# Patient Record
Sex: Male | Born: 1950 | Race: White | Hispanic: No | Marital: Married | Smoking: Never smoker
Health system: Southern US, Community
[De-identification: ages and names within clinical notes are randomized; demographics above are authoritative.]

## PROBLEM LIST (undated history)

## (undated) DIAGNOSIS — D72819 Decreased white blood cell count, unspecified: Secondary | ICD-10-CM

## (undated) DIAGNOSIS — J302 Other seasonal allergic rhinitis: Secondary | ICD-10-CM

## (undated) DIAGNOSIS — R112 Nausea with vomiting, unspecified: Secondary | ICD-10-CM

## (undated) DIAGNOSIS — E669 Obesity, unspecified: Secondary | ICD-10-CM

## (undated) DIAGNOSIS — D6851 Activated protein C resistance: Secondary | ICD-10-CM

## (undated) DIAGNOSIS — R600 Localized edema: Secondary | ICD-10-CM

## (undated) DIAGNOSIS — Z87442 Personal history of urinary calculi: Secondary | ICD-10-CM

## (undated) DIAGNOSIS — M1990 Unspecified osteoarthritis, unspecified site: Secondary | ICD-10-CM

## (undated) DIAGNOSIS — Z9889 Other specified postprocedural states: Secondary | ICD-10-CM

## (undated) DIAGNOSIS — D649 Anemia, unspecified: Secondary | ICD-10-CM

## (undated) DIAGNOSIS — K222 Esophageal obstruction: Secondary | ICD-10-CM

## (undated) DIAGNOSIS — Z789 Other specified health status: Secondary | ICD-10-CM

## (undated) DIAGNOSIS — G4733 Obstructive sleep apnea (adult) (pediatric): Secondary | ICD-10-CM

## (undated) DIAGNOSIS — F329 Major depressive disorder, single episode, unspecified: Secondary | ICD-10-CM

## (undated) DIAGNOSIS — M2550 Pain in unspecified joint: Secondary | ICD-10-CM

## (undated) DIAGNOSIS — J189 Pneumonia, unspecified organism: Secondary | ICD-10-CM

## (undated) DIAGNOSIS — F3289 Other specified depressive episodes: Secondary | ICD-10-CM

## (undated) DIAGNOSIS — G629 Polyneuropathy, unspecified: Secondary | ICD-10-CM

## (undated) DIAGNOSIS — L408 Other psoriasis: Secondary | ICD-10-CM

## (undated) DIAGNOSIS — R609 Edema, unspecified: Secondary | ICD-10-CM

## (undated) DIAGNOSIS — Z860100 Personal history of colon polyps, unspecified: Secondary | ICD-10-CM

## (undated) DIAGNOSIS — I2699 Other pulmonary embolism without acute cor pulmonale: Secondary | ICD-10-CM

## (undated) DIAGNOSIS — K219 Gastro-esophageal reflux disease without esophagitis: Secondary | ICD-10-CM

## (undated) DIAGNOSIS — I2510 Atherosclerotic heart disease of native coronary artery without angina pectoris: Secondary | ICD-10-CM

## (undated) DIAGNOSIS — I10 Essential (primary) hypertension: Secondary | ICD-10-CM

## (undated) DIAGNOSIS — E119 Type 2 diabetes mellitus without complications: Secondary | ICD-10-CM

## (undated) DIAGNOSIS — E785 Hyperlipidemia, unspecified: Secondary | ICD-10-CM

## (undated) HISTORY — PX: CORONARY ANGIOPLASTY WITH STENT PLACEMENT: SHX49

## (undated) HISTORY — DX: Other pulmonary embolism without acute cor pulmonale: I26.99

## (undated) HISTORY — DX: Anemia, unspecified: D64.9

## (undated) HISTORY — DX: Obstructive sleep apnea (adult) (pediatric): G47.33

## (undated) HISTORY — PX: ESOPHAGOGASTRODUODENOSCOPY: SHX1529

## (undated) HISTORY — PX: CYSTOSCOPY: SUR368

## (undated) HISTORY — DX: Major depressive disorder, single episode, unspecified: F32.9

## (undated) HISTORY — DX: Other specified health status: Z78.9

## (undated) HISTORY — DX: Obesity, unspecified: E66.9

## (undated) HISTORY — DX: Gastro-esophageal reflux disease without esophagitis: K21.9

## (undated) HISTORY — DX: Atherosclerotic heart disease of native coronary artery without angina pectoris: I25.10

## (undated) HISTORY — PX: COLONOSCOPY: SHX174

## (undated) HISTORY — DX: Essential (primary) hypertension: I10

## (undated) HISTORY — DX: Esophageal obstruction: K22.2

## (undated) HISTORY — DX: Type 2 diabetes mellitus without complications: E11.9

## (undated) HISTORY — PX: CARPAL TUNNEL RELEASE: SHX101

## (undated) HISTORY — DX: Hyperlipidemia, unspecified: E78.5

## (undated) HISTORY — DX: Decreased white blood cell count, unspecified: D72.819

## (undated) HISTORY — DX: Other psoriasis: L40.8

## (undated) HISTORY — DX: Activated protein C resistance: D68.51

## (undated) HISTORY — DX: Other specified depressive episodes: F32.89

## (undated) HISTORY — DX: Personal history of urinary calculi: Z87.442

## (undated) HISTORY — PX: ANGIOPLASTY: SHX39

---

## 2000-07-02 ENCOUNTER — Emergency Department (HOSPITAL_COMMUNITY): Admission: EM | Admit: 2000-07-02 | Discharge: 2000-07-02 | Payer: Self-pay | Admitting: Emergency Medicine

## 2000-07-02 ENCOUNTER — Encounter: Payer: Self-pay | Admitting: Emergency Medicine

## 2001-04-03 ENCOUNTER — Encounter: Admission: RE | Admit: 2001-04-03 | Discharge: 2001-07-02 | Payer: Self-pay | Admitting: Internal Medicine

## 2002-10-03 ENCOUNTER — Encounter (INDEPENDENT_AMBULATORY_CARE_PROVIDER_SITE_OTHER): Payer: Self-pay | Admitting: Gastroenterology

## 2002-10-10 ENCOUNTER — Encounter (INDEPENDENT_AMBULATORY_CARE_PROVIDER_SITE_OTHER): Payer: Self-pay | Admitting: *Deleted

## 2004-05-04 ENCOUNTER — Inpatient Hospital Stay (HOSPITAL_COMMUNITY): Admission: EM | Admit: 2004-05-04 | Discharge: 2004-05-04 | Payer: Self-pay | Admitting: *Deleted

## 2004-05-24 ENCOUNTER — Encounter (HOSPITAL_COMMUNITY): Admission: RE | Admit: 2004-05-24 | Discharge: 2004-08-22 | Payer: Self-pay | Admitting: Cardiology

## 2004-07-29 ENCOUNTER — Ambulatory Visit: Payer: Self-pay | Admitting: Internal Medicine

## 2004-08-03 ENCOUNTER — Ambulatory Visit: Payer: Self-pay | Admitting: Cardiology

## 2004-08-17 ENCOUNTER — Ambulatory Visit: Payer: Self-pay | Admitting: Internal Medicine

## 2004-08-23 ENCOUNTER — Encounter (HOSPITAL_COMMUNITY): Admission: RE | Admit: 2004-08-23 | Discharge: 2004-09-24 | Payer: Self-pay | Admitting: Cardiology

## 2004-08-24 ENCOUNTER — Ambulatory Visit: Payer: Self-pay | Admitting: Internal Medicine

## 2004-09-02 ENCOUNTER — Ambulatory Visit: Payer: Self-pay | Admitting: Cardiology

## 2004-09-03 ENCOUNTER — Ambulatory Visit: Payer: Self-pay

## 2004-09-14 ENCOUNTER — Ambulatory Visit: Payer: Self-pay | Admitting: Cardiology

## 2004-09-28 ENCOUNTER — Inpatient Hospital Stay (HOSPITAL_COMMUNITY): Admission: EM | Admit: 2004-09-28 | Discharge: 2004-09-29 | Payer: Self-pay | Admitting: Emergency Medicine

## 2004-09-28 ENCOUNTER — Ambulatory Visit: Payer: Self-pay | Admitting: Cardiovascular Disease

## 2004-10-05 ENCOUNTER — Ambulatory Visit: Payer: Self-pay | Admitting: Cardiology

## 2004-10-07 ENCOUNTER — Ambulatory Visit: Payer: Self-pay | Admitting: Cardiology

## 2005-01-04 ENCOUNTER — Ambulatory Visit: Payer: Self-pay | Admitting: Internal Medicine

## 2005-01-17 ENCOUNTER — Ambulatory Visit: Payer: Self-pay | Admitting: Cardiology

## 2005-01-19 ENCOUNTER — Ambulatory Visit: Payer: Self-pay | Admitting: Internal Medicine

## 2005-02-28 ENCOUNTER — Ambulatory Visit: Payer: Self-pay | Admitting: Cardiology

## 2005-03-02 ENCOUNTER — Ambulatory Visit: Payer: Self-pay | Admitting: Internal Medicine

## 2005-06-24 ENCOUNTER — Ambulatory Visit: Payer: Self-pay | Admitting: Family Medicine

## 2005-07-04 ENCOUNTER — Ambulatory Visit: Payer: Self-pay | Admitting: Internal Medicine

## 2005-07-28 ENCOUNTER — Ambulatory Visit: Payer: Self-pay | Admitting: Internal Medicine

## 2005-08-09 ENCOUNTER — Ambulatory Visit: Payer: Self-pay | Admitting: Internal Medicine

## 2005-08-16 ENCOUNTER — Ambulatory Visit: Payer: Self-pay | Admitting: Internal Medicine

## 2005-09-01 ENCOUNTER — Ambulatory Visit: Payer: Self-pay | Admitting: Cardiology

## 2005-09-07 ENCOUNTER — Ambulatory Visit (HOSPITAL_BASED_OUTPATIENT_CLINIC_OR_DEPARTMENT_OTHER): Admission: RE | Admit: 2005-09-07 | Discharge: 2005-09-07 | Payer: Self-pay | Admitting: Internal Medicine

## 2005-09-07 ENCOUNTER — Encounter: Payer: Self-pay | Admitting: Internal Medicine

## 2005-09-12 ENCOUNTER — Ambulatory Visit: Payer: Self-pay | Admitting: Pulmonary Disease

## 2005-09-20 ENCOUNTER — Ambulatory Visit: Payer: Self-pay | Admitting: Internal Medicine

## 2005-09-26 HISTORY — PX: KNEE ARTHROSCOPY: SUR90

## 2005-09-30 ENCOUNTER — Ambulatory Visit: Payer: Self-pay | Admitting: Cardiology

## 2005-09-30 ENCOUNTER — Inpatient Hospital Stay (HOSPITAL_COMMUNITY): Admission: EM | Admit: 2005-09-30 | Discharge: 2005-10-01 | Payer: Self-pay | Admitting: Emergency Medicine

## 2005-10-02 ENCOUNTER — Emergency Department (HOSPITAL_COMMUNITY): Admission: EM | Admit: 2005-10-02 | Discharge: 2005-10-02 | Payer: Self-pay | Admitting: Family Medicine

## 2005-10-10 ENCOUNTER — Ambulatory Visit: Payer: Self-pay | Admitting: Internal Medicine

## 2005-10-12 ENCOUNTER — Ambulatory Visit: Payer: Self-pay

## 2005-10-20 ENCOUNTER — Ambulatory Visit: Payer: Self-pay | Admitting: Cardiology

## 2005-11-25 ENCOUNTER — Ambulatory Visit: Payer: Self-pay | Admitting: Internal Medicine

## 2005-12-15 ENCOUNTER — Ambulatory Visit: Payer: Self-pay | Admitting: Internal Medicine

## 2005-12-22 ENCOUNTER — Ambulatory Visit: Payer: Self-pay | Admitting: Internal Medicine

## 2005-12-25 ENCOUNTER — Ambulatory Visit: Payer: Self-pay | Admitting: Internal Medicine

## 2005-12-25 ENCOUNTER — Inpatient Hospital Stay (HOSPITAL_COMMUNITY): Admission: EM | Admit: 2005-12-25 | Discharge: 2005-12-27 | Payer: Self-pay | Admitting: *Deleted

## 2006-01-02 ENCOUNTER — Emergency Department (HOSPITAL_COMMUNITY): Admission: EM | Admit: 2006-01-02 | Discharge: 2006-01-02 | Payer: Self-pay | Admitting: Family Medicine

## 2006-01-03 ENCOUNTER — Ambulatory Visit: Payer: Self-pay | Admitting: Internal Medicine

## 2006-01-10 ENCOUNTER — Ambulatory Visit: Payer: Self-pay | Admitting: Internal Medicine

## 2006-02-09 ENCOUNTER — Ambulatory Visit: Payer: Self-pay | Admitting: Internal Medicine

## 2006-04-06 ENCOUNTER — Ambulatory Visit: Payer: Self-pay | Admitting: Internal Medicine

## 2006-04-14 ENCOUNTER — Ambulatory Visit: Payer: Self-pay | Admitting: Internal Medicine

## 2006-05-01 ENCOUNTER — Ambulatory Visit: Payer: Self-pay | Admitting: Internal Medicine

## 2006-05-04 ENCOUNTER — Ambulatory Visit: Payer: Self-pay | Admitting: Internal Medicine

## 2006-05-04 ENCOUNTER — Ambulatory Visit: Payer: Self-pay | Admitting: Endocrinology

## 2006-05-15 ENCOUNTER — Ambulatory Visit: Payer: Self-pay | Admitting: Internal Medicine

## 2006-05-22 ENCOUNTER — Ambulatory Visit: Payer: Self-pay | Admitting: Internal Medicine

## 2006-06-19 ENCOUNTER — Ambulatory Visit: Payer: Self-pay | Admitting: Internal Medicine

## 2006-06-26 ENCOUNTER — Encounter: Admission: RE | Admit: 2006-06-26 | Discharge: 2006-08-23 | Payer: Self-pay | Admitting: Internal Medicine

## 2006-08-02 ENCOUNTER — Ambulatory Visit: Payer: Self-pay | Admitting: Internal Medicine

## 2006-08-02 LAB — CONVERTED CEMR LAB
ALT: 25 units/L (ref 0–40)
AST: 19 units/L (ref 0–37)
Albumin: 3.7 g/dL (ref 3.5–5.2)
Alkaline Phosphatase: 91 units/L (ref 39–117)
BUN: 17 mg/dL (ref 6–23)
Bilirubin, Direct: 0.1 mg/dL (ref 0.0–0.3)
CO2: 29 meq/L (ref 19–32)
Calcium: 10 mg/dL (ref 8.4–10.5)
Chloride: 105 meq/L (ref 96–112)
Creatinine, Ser: 0.8 mg/dL (ref 0.4–1.5)
GFR calc non Af Amer: 107 mL/min
Glomerular Filtration Rate, Af Am: 130 mL/min/{1.73_m2}
Glucose, Bld: 103 mg/dL — ABNORMAL HIGH (ref 70–99)
Hgb A1c MFr Bld: 7.4 % — ABNORMAL HIGH (ref 4.6–6.0)
Potassium: 4.6 meq/L (ref 3.5–5.1)
Sodium: 142 meq/L (ref 135–145)
Total Bilirubin: 0.5 mg/dL (ref 0.3–1.2)
Total Protein: 6.6 g/dL (ref 6.0–8.3)

## 2006-08-14 ENCOUNTER — Ambulatory Visit: Payer: Self-pay | Admitting: Cardiology

## 2006-08-24 ENCOUNTER — Encounter: Admission: RE | Admit: 2006-08-24 | Discharge: 2006-08-24 | Payer: Self-pay | Admitting: Orthopedic Surgery

## 2006-08-25 ENCOUNTER — Ambulatory Visit: Payer: Self-pay | Admitting: Cardiology

## 2006-09-06 ENCOUNTER — Ambulatory Visit: Payer: Self-pay | Admitting: Internal Medicine

## 2006-09-27 ENCOUNTER — Encounter: Admission: RE | Admit: 2006-09-27 | Discharge: 2006-12-26 | Payer: Self-pay | Admitting: Internal Medicine

## 2006-10-19 ENCOUNTER — Ambulatory Visit: Payer: Self-pay | Admitting: Cardiology

## 2006-12-18 ENCOUNTER — Ambulatory Visit: Payer: Self-pay | Admitting: Cardiology

## 2006-12-25 ENCOUNTER — Ambulatory Visit: Payer: Self-pay

## 2007-01-16 ENCOUNTER — Ambulatory Visit: Payer: Self-pay | Admitting: Internal Medicine

## 2007-01-16 LAB — CONVERTED CEMR LAB
ALT: 25 units/L (ref 0–40)
AST: 22 units/L (ref 0–37)
Albumin: 3.5 g/dL (ref 3.5–5.2)
Alkaline Phosphatase: 98 units/L (ref 39–117)
BUN: 7 mg/dL (ref 6–23)
Bilirubin, Direct: 0.1 mg/dL (ref 0.0–0.3)
CO2: 31 meq/L (ref 19–32)
Calcium: 9.4 mg/dL (ref 8.4–10.5)
Chloride: 107 meq/L (ref 96–112)
Cholesterol: 151 mg/dL (ref 0–200)
Creatinine, Ser: 0.7 mg/dL (ref 0.4–1.5)
Direct LDL: 98.2 mg/dL
GFR calc Af Amer: 152 mL/min
GFR calc non Af Amer: 125 mL/min
Glucose, Bld: 72 mg/dL (ref 70–99)
HDL: 29.8 mg/dL — ABNORMAL LOW (ref >39.0)
Hgb A1c MFr Bld: 7.4 % — ABNORMAL HIGH (ref 4.6–6.0)
Potassium: 3.4 meq/L — ABNORMAL LOW (ref 3.5–5.1)
Sodium: 145 meq/L (ref 135–145)
Total Bilirubin: 0.7 mg/dL (ref 0.3–1.2)
Total CHOL/HDL Ratio: 5.1
Total Protein: 6.7 g/dL (ref 6.0–8.3)
Triglycerides: 221 mg/dL (ref 0–149)
VLDL: 44 mg/dL — ABNORMAL HIGH (ref 0–40)

## 2007-01-31 ENCOUNTER — Ambulatory Visit: Payer: Self-pay | Admitting: Internal Medicine

## 2007-02-08 ENCOUNTER — Encounter: Admission: RE | Admit: 2007-02-08 | Discharge: 2007-02-08 | Payer: Self-pay | Admitting: Internal Medicine

## 2007-02-16 ENCOUNTER — Ambulatory Visit: Payer: Self-pay | Admitting: Internal Medicine

## 2007-02-21 ENCOUNTER — Ambulatory Visit: Payer: Self-pay | Admitting: Gastroenterology

## 2007-03-06 ENCOUNTER — Ambulatory Visit: Payer: Self-pay | Admitting: Internal Medicine

## 2007-04-12 DIAGNOSIS — K219 Gastro-esophageal reflux disease without esophagitis: Secondary | ICD-10-CM | POA: Insufficient documentation

## 2007-04-12 DIAGNOSIS — I2510 Atherosclerotic heart disease of native coronary artery without angina pectoris: Secondary | ICD-10-CM | POA: Insufficient documentation

## 2007-04-12 DIAGNOSIS — E785 Hyperlipidemia, unspecified: Secondary | ICD-10-CM | POA: Insufficient documentation

## 2007-04-12 DIAGNOSIS — I10 Essential (primary) hypertension: Secondary | ICD-10-CM

## 2007-04-26 ENCOUNTER — Ambulatory Visit: Payer: Self-pay | Admitting: Internal Medicine

## 2007-04-26 LAB — CONVERTED CEMR LAB
ALT: 21 units/L (ref 0–53)
AST: 17 units/L (ref 0–37)
Albumin: 3.7 g/dL (ref 3.5–5.2)
Alkaline Phosphatase: 118 units/L — ABNORMAL HIGH (ref 39–117)
BUN: 17 mg/dL (ref 6–23)
Basophils Relative: 1.2 % — ABNORMAL HIGH (ref 0.0–1.0)
Bilirubin, Direct: 0.1 mg/dL (ref 0.0–0.3)
CO2: 27 meq/L (ref 19–32)
Calcium: 9.7 mg/dL (ref 8.4–10.5)
Chloride: 105 meq/L (ref 96–112)
Cholesterol, target level: 200 mg/dL
Cholesterol: 166 mg/dL (ref 0–200)
Creatinine, Ser: 0.7 mg/dL (ref 0.4–1.5)
Direct LDL: 106.9 mg/dL
Eosinophils Relative: 6.7 % — ABNORMAL HIGH (ref 0.0–5.0)
GFR calc Af Amer: 152 mL/min
GFR calc non Af Amer: 125 mL/min
Glucose, Bld: 189 mg/dL — ABNORMAL HIGH (ref 70–99)
HCT: 37.4 % — ABNORMAL LOW (ref 39.0–52.0)
HDL goal, serum: 40 mg/dL
HDL: 23.7 mg/dL — ABNORMAL LOW (ref >39.0)
Hemoglobin: 13.1 g/dL (ref 13.0–17.0)
LDL Goal: 100 mg/dL
Lymphocytes Relative: 16.8 % (ref 12.0–46.0)
MCHC: 35 g/dL (ref 30.0–36.0)
MCV: 84.9 fL (ref 78.0–100.0)
Monocytes Relative: 3.6 % (ref 3.0–11.0)
Neutrophils Relative %: 71.7 % (ref 43.0–77.0)
Platelets: 285 10*3/uL (ref 150–400)
Potassium: 4 meq/L (ref 3.5–5.1)
RBC: 4.4 M/uL (ref 4.22–5.81)
RDW: 13 % (ref 11.5–14.6)
Sodium: 140 meq/L (ref 135–145)
Total Bilirubin: 0.6 mg/dL (ref 0.3–1.2)
Total CHOL/HDL Ratio: 7
Total Protein: 6.9 g/dL (ref 6.0–8.3)
Triglycerides: 254 mg/dL (ref 0–149)
VLDL: 51 mg/dL — ABNORMAL HIGH (ref 0–40)
WBC: 5.2 10*3/uL (ref 4.5–10.5)

## 2007-05-02 ENCOUNTER — Telehealth (INDEPENDENT_AMBULATORY_CARE_PROVIDER_SITE_OTHER): Payer: Self-pay | Admitting: *Deleted

## 2007-06-08 ENCOUNTER — Ambulatory Visit: Payer: Self-pay | Admitting: Internal Medicine

## 2007-06-23 ENCOUNTER — Ambulatory Visit: Payer: Self-pay | Admitting: Internal Medicine

## 2007-06-23 ENCOUNTER — Observation Stay (HOSPITAL_COMMUNITY): Admission: EM | Admit: 2007-06-23 | Discharge: 2007-06-24 | Payer: Self-pay | Admitting: Emergency Medicine

## 2007-06-27 ENCOUNTER — Encounter: Payer: Self-pay | Admitting: Internal Medicine

## 2007-06-28 ENCOUNTER — Ambulatory Visit: Payer: Self-pay | Admitting: Cardiology

## 2007-06-28 ENCOUNTER — Ambulatory Visit: Payer: Self-pay

## 2007-07-05 ENCOUNTER — Encounter: Payer: Self-pay | Admitting: *Deleted

## 2007-07-11 ENCOUNTER — Ambulatory Visit: Payer: Self-pay | Admitting: Internal Medicine

## 2007-07-16 ENCOUNTER — Telehealth: Payer: Self-pay | Admitting: *Deleted

## 2007-07-17 ENCOUNTER — Encounter: Admission: RE | Admit: 2007-07-17 | Discharge: 2007-08-22 | Payer: Self-pay | Admitting: Internal Medicine

## 2007-07-19 ENCOUNTER — Telehealth: Payer: Self-pay | Admitting: Internal Medicine

## 2007-08-08 ENCOUNTER — Ambulatory Visit: Payer: Self-pay | Admitting: Internal Medicine

## 2007-08-08 LAB — CONVERTED CEMR LAB
ALT: 21 units/L (ref 0–53)
AST: 15 units/L (ref 0–37)
Albumin: 3.6 g/dL (ref 3.5–5.2)
Alkaline Phosphatase: 118 units/L — ABNORMAL HIGH (ref 39–117)
BUN: 17 mg/dL (ref 6–23)
Basophils Absolute: 0.1 10*3/uL (ref 0.0–0.1)
Basophils Relative: 1.4 % — ABNORMAL HIGH (ref 0.0–1.0)
Bilirubin, Direct: 0.1 mg/dL (ref 0.0–0.3)
CO2: 31 meq/L (ref 19–32)
Calcium: 10 mg/dL (ref 8.4–10.5)
Chloride: 104 meq/L (ref 96–112)
Cholesterol: 249 mg/dL (ref 0–200)
Creatinine, Ser: 0.7 mg/dL (ref 0.4–1.5)
Direct LDL: 158.3 mg/dL
Eosinophils Absolute: 0.2 10*3/uL (ref 0.0–0.6)
Eosinophils Relative: 3.5 % (ref 0.0–5.0)
GFR calc Af Amer: 151 mL/min
GFR calc non Af Amer: 125 mL/min
Glucose, Bld: 171 mg/dL — ABNORMAL HIGH (ref 70–99)
HCT: 38.2 % — ABNORMAL LOW (ref 39.0–52.0)
HDL: 25.5 mg/dL — ABNORMAL LOW (ref >39.0)
Hemoglobin: 13.2 g/dL (ref 13.0–17.0)
Hgb A1c MFr Bld: 7.5 % — ABNORMAL HIGH (ref 4.6–6.0)
Lymphocytes Relative: 19.3 % (ref 12.0–46.0)
MCHC: 34.5 g/dL (ref 30.0–36.0)
MCV: 84.1 fL (ref 78.0–100.0)
Monocytes Absolute: 0.2 10*3/uL (ref 0.2–0.7)
Monocytes Relative: 3.2 % (ref 3.0–11.0)
Neutro Abs: 3.7 10*3/uL (ref 1.4–7.7)
Neutrophils Relative %: 72.6 % (ref 43.0–77.0)
Platelets: 268 10*3/uL (ref 150–400)
Potassium: 4.6 meq/L (ref 3.5–5.1)
RBC: 4.55 M/uL (ref 4.22–5.81)
RDW: 13.2 % (ref 11.5–14.6)
Sodium: 141 meq/L (ref 135–145)
Total Bilirubin: 0.7 mg/dL (ref 0.3–1.2)
Total CHOL/HDL Ratio: 9.8
Total Protein: 6.6 g/dL (ref 6.0–8.3)
Triglycerides: 417 mg/dL (ref 0–149)
VLDL: 83 mg/dL — ABNORMAL HIGH (ref 0–40)
WBC: 5.2 10*3/uL (ref 4.5–10.5)

## 2007-08-15 ENCOUNTER — Ambulatory Visit: Payer: Self-pay | Admitting: Internal Medicine

## 2007-08-22 ENCOUNTER — Encounter: Payer: Self-pay | Admitting: Internal Medicine

## 2007-09-11 ENCOUNTER — Ambulatory Visit: Payer: Self-pay | Admitting: Internal Medicine

## 2007-10-30 ENCOUNTER — Ambulatory Visit: Payer: Self-pay | Admitting: Internal Medicine

## 2007-10-30 ENCOUNTER — Ambulatory Visit: Payer: Self-pay | Admitting: Cardiology

## 2007-10-30 LAB — CONVERTED CEMR LAB
ALT: 19 units/L (ref 0–53)
Alkaline Phosphatase: 108 units/L (ref 39–117)
BUN: 15 mg/dL (ref 6–23)
Bilirubin, Direct: 0.2 mg/dL (ref 0.0–0.3)
CO2: 28 meq/L (ref 19–32)
Calcium: 10.1 mg/dL (ref 8.4–10.5)
Direct LDL: 143.3 mg/dL
GFR calc Af Amer: 130 mL/min
GFR calc non Af Amer: 107 mL/min
HDL: 27.8 mg/dL — ABNORMAL LOW (ref >39.0)
Potassium: 4.8 meq/L (ref 3.5–5.1)
Total CHOL/HDL Ratio: 8.6
Total Protein: 6.7 g/dL (ref 6.0–8.3)
Triglycerides: 376 mg/dL (ref 0–149)
VLDL: 75 mg/dL — ABNORMAL HIGH (ref 0–40)

## 2007-11-06 ENCOUNTER — Ambulatory Visit: Payer: Self-pay | Admitting: Internal Medicine

## 2007-12-18 ENCOUNTER — Telehealth: Payer: Self-pay | Admitting: Internal Medicine

## 2008-01-01 ENCOUNTER — Ambulatory Visit: Payer: Self-pay | Admitting: Internal Medicine

## 2008-01-01 LAB — CONVERTED CEMR LAB
ALT: 20 units/L (ref 0–53)
Albumin: 3.8 g/dL (ref 3.5–5.2)
Alkaline Phosphatase: 109 units/L (ref 39–117)
Bilirubin, Direct: 0.1 mg/dL (ref 0.0–0.3)
Cholesterol: 143 mg/dL (ref 0–200)
Total Protein: 7 g/dL (ref 6.0–8.3)
Triglycerides: 197 mg/dL — ABNORMAL HIGH (ref 0–149)
VLDL: 39 mg/dL (ref 0–40)

## 2008-01-07 ENCOUNTER — Ambulatory Visit: Payer: Self-pay | Admitting: Internal Medicine

## 2008-01-09 ENCOUNTER — Encounter: Payer: Self-pay | Admitting: Internal Medicine

## 2008-01-31 ENCOUNTER — Encounter
Admission: RE | Admit: 2008-01-31 | Discharge: 2008-04-30 | Payer: Self-pay | Admitting: Physical Medicine and Rehabilitation

## 2008-02-04 ENCOUNTER — Ambulatory Visit: Payer: Self-pay | Admitting: Physical Medicine and Rehabilitation

## 2008-02-13 ENCOUNTER — Encounter: Admission: RE | Admit: 2008-02-13 | Discharge: 2008-02-13 | Payer: Self-pay | Admitting: Anesthesiology

## 2008-03-07 ENCOUNTER — Ambulatory Visit: Payer: Self-pay | Admitting: Physical Medicine and Rehabilitation

## 2008-03-20 ENCOUNTER — Encounter
Admission: RE | Admit: 2008-03-20 | Discharge: 2008-05-19 | Payer: Self-pay | Admitting: Physical Medicine and Rehabilitation

## 2008-04-04 ENCOUNTER — Ambulatory Visit: Payer: Self-pay | Admitting: Internal Medicine

## 2008-04-04 LAB — CONVERTED CEMR LAB
ALT: 23 units/L (ref 0–53)
AST: 17 units/L (ref 0–37)
Alkaline Phosphatase: 111 units/L (ref 39–117)
BUN: 19 mg/dL (ref 6–23)
Bilirubin, Direct: 0.1 mg/dL (ref 0.0–0.3)
CO2: 29 meq/L (ref 19–32)
Glucose, Bld: 245 mg/dL — ABNORMAL HIGH (ref 70–99)
Potassium: 4.2 meq/L (ref 3.5–5.1)
Sodium: 139 meq/L (ref 135–145)
Total Protein: 7.1 g/dL (ref 6.0–8.3)

## 2008-04-09 ENCOUNTER — Ambulatory Visit: Payer: Self-pay | Admitting: Physical Medicine and Rehabilitation

## 2008-04-11 ENCOUNTER — Ambulatory Visit: Payer: Self-pay | Admitting: Internal Medicine

## 2008-04-16 ENCOUNTER — Telehealth: Payer: Self-pay | Admitting: Internal Medicine

## 2008-04-18 ENCOUNTER — Telehealth: Payer: Self-pay | Admitting: *Deleted

## 2008-04-22 ENCOUNTER — Ambulatory Visit: Payer: Self-pay | Admitting: Internal Medicine

## 2008-05-06 ENCOUNTER — Telehealth: Payer: Self-pay | Admitting: Internal Medicine

## 2008-05-08 ENCOUNTER — Ambulatory Visit: Payer: Self-pay | Admitting: Cardiology

## 2008-05-12 ENCOUNTER — Encounter
Admission: RE | Admit: 2008-05-12 | Discharge: 2008-05-12 | Payer: Self-pay | Admitting: Physical Medicine and Rehabilitation

## 2008-06-06 ENCOUNTER — Telehealth: Payer: Self-pay | Admitting: Internal Medicine

## 2008-06-11 ENCOUNTER — Ambulatory Visit (HOSPITAL_COMMUNITY): Admission: EM | Admit: 2008-06-11 | Discharge: 2008-06-11 | Payer: Self-pay | Admitting: Emergency Medicine

## 2008-06-23 ENCOUNTER — Ambulatory Visit (HOSPITAL_COMMUNITY): Admission: RE | Admit: 2008-06-23 | Discharge: 2008-06-23 | Payer: Self-pay | Admitting: Urology

## 2008-07-10 ENCOUNTER — Ambulatory Visit: Payer: Self-pay | Admitting: *Deleted

## 2008-07-10 ENCOUNTER — Inpatient Hospital Stay (HOSPITAL_COMMUNITY): Admission: EM | Admit: 2008-07-10 | Discharge: 2008-07-12 | Payer: Self-pay | Admitting: Emergency Medicine

## 2008-07-23 ENCOUNTER — Ambulatory Visit: Payer: Self-pay | Admitting: Cardiology

## 2008-07-31 ENCOUNTER — Encounter (HOSPITAL_COMMUNITY): Admission: RE | Admit: 2008-07-31 | Discharge: 2008-09-24 | Payer: Self-pay | Admitting: Cardiology

## 2008-08-05 ENCOUNTER — Encounter: Payer: Self-pay | Admitting: Internal Medicine

## 2008-08-07 ENCOUNTER — Ambulatory Visit: Payer: Self-pay | Admitting: Internal Medicine

## 2008-08-07 LAB — CONVERTED CEMR LAB
AST: 16 units/L (ref 0–37)
Alkaline Phosphatase: 116 units/L (ref 39–117)
Bilirubin, Direct: 0.1 mg/dL (ref 0.0–0.3)
Chloride: 101 meq/L (ref 96–112)
Creatinine, Ser: 0.8 mg/dL (ref 0.4–1.5)
GFR calc non Af Amer: 107 mL/min
Hgb A1c MFr Bld: 7.8 % — ABNORMAL HIGH (ref 4.6–6.0)
Potassium: 4.1 meq/L (ref 3.5–5.1)
Sodium: 138 meq/L (ref 135–145)
Total Bilirubin: 0.7 mg/dL (ref 0.3–1.2)
Total CHOL/HDL Ratio: 5.1
VLDL: 42 mg/dL — ABNORMAL HIGH (ref 0–40)

## 2008-08-15 ENCOUNTER — Ambulatory Visit: Payer: Self-pay | Admitting: Internal Medicine

## 2008-08-20 ENCOUNTER — Encounter: Payer: Self-pay | Admitting: Internal Medicine

## 2008-08-25 ENCOUNTER — Telehealth: Payer: Self-pay | Admitting: Internal Medicine

## 2008-08-26 ENCOUNTER — Ambulatory Visit: Payer: Self-pay | Admitting: Internal Medicine

## 2008-09-26 ENCOUNTER — Encounter (HOSPITAL_COMMUNITY): Admission: RE | Admit: 2008-09-26 | Discharge: 2008-11-24 | Payer: Self-pay | Admitting: Cardiology

## 2008-10-02 ENCOUNTER — Ambulatory Visit: Payer: Self-pay | Admitting: Internal Medicine

## 2008-10-06 ENCOUNTER — Encounter: Admission: RE | Admit: 2008-10-06 | Discharge: 2008-10-06 | Payer: Self-pay | Admitting: Sports Medicine

## 2008-10-07 ENCOUNTER — Ambulatory Visit: Payer: Self-pay | Admitting: Cardiology

## 2008-10-08 ENCOUNTER — Encounter: Payer: Self-pay | Admitting: Internal Medicine

## 2008-10-08 ENCOUNTER — Ambulatory Visit: Payer: Self-pay

## 2008-10-09 ENCOUNTER — Ambulatory Visit: Payer: Self-pay | Admitting: Internal Medicine

## 2008-10-14 ENCOUNTER — Encounter: Admission: RE | Admit: 2008-10-14 | Discharge: 2008-10-14 | Payer: Self-pay | Admitting: Internal Medicine

## 2008-11-17 ENCOUNTER — Ambulatory Visit: Payer: Self-pay | Admitting: Internal Medicine

## 2008-11-17 LAB — CONVERTED CEMR LAB
Albumin: 4 g/dL (ref 3.5–5.2)
Bilirubin, Direct: 0.1 mg/dL (ref 0.0–0.3)
Calcium: 9.6 mg/dL (ref 8.4–10.5)
Direct LDL: 92.2 mg/dL
GFR calc Af Amer: 151 mL/min
Glucose, Bld: 194 mg/dL — ABNORMAL HIGH (ref 70–99)
HDL: 26.3 mg/dL — ABNORMAL LOW (ref >39.0)
Sodium: 141 meq/L (ref 135–145)
Total Protein: 7.4 g/dL (ref 6.0–8.3)
Triglycerides: 266 mg/dL (ref 0–149)

## 2008-11-20 ENCOUNTER — Ambulatory Visit: Payer: Self-pay | Admitting: Internal Medicine

## 2008-12-09 ENCOUNTER — Telehealth: Payer: Self-pay | Admitting: Internal Medicine

## 2008-12-13 ENCOUNTER — Ambulatory Visit: Payer: Self-pay | Admitting: Family Medicine

## 2008-12-13 ENCOUNTER — Telehealth (INDEPENDENT_AMBULATORY_CARE_PROVIDER_SITE_OTHER): Payer: Self-pay | Admitting: *Deleted

## 2008-12-15 ENCOUNTER — Ambulatory Visit: Payer: Self-pay | Admitting: Cardiology

## 2009-01-12 ENCOUNTER — Inpatient Hospital Stay (HOSPITAL_COMMUNITY): Admission: EM | Admit: 2009-01-12 | Discharge: 2009-01-15 | Payer: Self-pay | Admitting: Emergency Medicine

## 2009-01-12 ENCOUNTER — Ambulatory Visit: Payer: Self-pay | Admitting: Internal Medicine

## 2009-01-13 ENCOUNTER — Ambulatory Visit: Payer: Self-pay | Admitting: Vascular Surgery

## 2009-01-13 ENCOUNTER — Encounter: Payer: Self-pay | Admitting: Cardiology

## 2009-01-13 ENCOUNTER — Encounter: Payer: Self-pay | Admitting: Internal Medicine

## 2009-01-15 ENCOUNTER — Ambulatory Visit: Payer: Self-pay | Admitting: Oncology

## 2009-01-16 DIAGNOSIS — K222 Esophageal obstruction: Secondary | ICD-10-CM

## 2009-01-16 DIAGNOSIS — Z9989 Dependence on other enabling machines and devices: Secondary | ICD-10-CM

## 2009-01-16 DIAGNOSIS — G4733 Obstructive sleep apnea (adult) (pediatric): Secondary | ICD-10-CM | POA: Insufficient documentation

## 2009-01-16 DIAGNOSIS — I701 Atherosclerosis of renal artery: Secondary | ICD-10-CM

## 2009-01-16 DIAGNOSIS — L409 Psoriasis, unspecified: Secondary | ICD-10-CM | POA: Insufficient documentation

## 2009-01-16 HISTORY — DX: Obstructive sleep apnea (adult) (pediatric): G47.33

## 2009-01-16 HISTORY — DX: Esophageal obstruction: K22.2

## 2009-01-20 ENCOUNTER — Ambulatory Visit: Payer: Self-pay | Admitting: Cardiology

## 2009-01-23 ENCOUNTER — Ambulatory Visit: Payer: Self-pay | Admitting: Internal Medicine

## 2009-01-23 DIAGNOSIS — D6851 Activated protein C resistance: Secondary | ICD-10-CM

## 2009-01-30 ENCOUNTER — Encounter: Payer: Self-pay | Admitting: Cardiology

## 2009-02-05 ENCOUNTER — Ambulatory Visit: Payer: Self-pay | Admitting: Cardiology

## 2009-02-05 ENCOUNTER — Encounter: Payer: Self-pay | Admitting: Internal Medicine

## 2009-02-06 ENCOUNTER — Telehealth: Payer: Self-pay | Admitting: Internal Medicine

## 2009-02-09 ENCOUNTER — Encounter: Payer: Self-pay | Admitting: Internal Medicine

## 2009-02-11 ENCOUNTER — Encounter: Payer: Self-pay | Admitting: Critical Care Medicine

## 2009-02-11 ENCOUNTER — Encounter: Payer: Self-pay | Admitting: Cardiology

## 2009-02-21 ENCOUNTER — Emergency Department (HOSPITAL_COMMUNITY): Admission: EM | Admit: 2009-02-21 | Discharge: 2009-02-21 | Payer: Self-pay | Admitting: Emergency Medicine

## 2009-02-27 ENCOUNTER — Ambulatory Visit: Payer: Self-pay | Admitting: Internal Medicine

## 2009-02-27 DIAGNOSIS — Z87442 Personal history of urinary calculi: Secondary | ICD-10-CM | POA: Insufficient documentation

## 2009-02-27 HISTORY — DX: Personal history of urinary calculi: Z87.442

## 2009-03-06 ENCOUNTER — Ambulatory Visit (HOSPITAL_COMMUNITY): Admission: RE | Admit: 2009-03-06 | Discharge: 2009-03-06 | Payer: Self-pay | Admitting: Urology

## 2009-03-08 DIAGNOSIS — F329 Major depressive disorder, single episode, unspecified: Secondary | ICD-10-CM

## 2009-03-12 ENCOUNTER — Ambulatory Visit: Payer: Self-pay | Admitting: Internal Medicine

## 2009-03-12 ENCOUNTER — Ambulatory Visit: Payer: Self-pay | Admitting: Cardiology

## 2009-03-12 LAB — CONVERTED CEMR LAB
ALT: 18 units/L (ref 0–53)
AST: 19 units/L (ref 0–37)
Albumin: 3.7 g/dL (ref 3.5–5.2)
Alkaline Phosphatase: 118 units/L — ABNORMAL HIGH (ref 39–117)
Calcium: 9.8 mg/dL (ref 8.4–10.5)
Cholesterol: 140 mg/dL (ref 0–200)
Creatinine, Ser: 1.3 mg/dL (ref 0.4–1.5)
Direct LDL: 82.1 mg/dL
Hgb A1c MFr Bld: 8.8 % — ABNORMAL HIGH (ref 4.6–6.5)
Total Protein: 7 g/dL (ref 6.0–8.3)

## 2009-03-17 ENCOUNTER — Encounter: Payer: Self-pay | Admitting: Critical Care Medicine

## 2009-03-19 ENCOUNTER — Ambulatory Visit: Payer: Self-pay | Admitting: Internal Medicine

## 2009-03-27 ENCOUNTER — Ambulatory Visit: Payer: Self-pay | Admitting: Critical Care Medicine

## 2009-03-27 ENCOUNTER — Ambulatory Visit: Payer: Self-pay | Admitting: Cardiovascular Disease

## 2009-05-15 ENCOUNTER — Encounter: Payer: Self-pay | Admitting: Critical Care Medicine

## 2009-05-18 ENCOUNTER — Ambulatory Visit: Payer: Self-pay | Admitting: Internal Medicine

## 2009-05-21 ENCOUNTER — Telehealth: Payer: Self-pay | Admitting: Internal Medicine

## 2009-06-10 ENCOUNTER — Ambulatory Visit: Payer: Self-pay | Admitting: Internal Medicine

## 2009-06-22 ENCOUNTER — Telehealth: Payer: Self-pay | Admitting: Internal Medicine

## 2009-06-22 ENCOUNTER — Ambulatory Visit: Payer: Self-pay | Admitting: Internal Medicine

## 2009-06-23 ENCOUNTER — Ambulatory Visit: Payer: Self-pay | Admitting: Cardiology

## 2009-07-06 ENCOUNTER — Ambulatory Visit: Payer: Self-pay | Admitting: Internal Medicine

## 2009-07-06 ENCOUNTER — Ambulatory Visit: Payer: Self-pay

## 2009-07-06 ENCOUNTER — Ambulatory Visit (HOSPITAL_COMMUNITY): Admission: RE | Admit: 2009-07-06 | Discharge: 2009-07-06 | Payer: Self-pay | Admitting: Internal Medicine

## 2009-07-06 ENCOUNTER — Encounter: Payer: Self-pay | Admitting: Cardiology

## 2009-07-09 ENCOUNTER — Ambulatory Visit: Payer: Self-pay | Admitting: Internal Medicine

## 2009-07-09 LAB — CONVERTED CEMR LAB
ALT: 27 units/L (ref 0–53)
Alkaline Phosphatase: 117 units/L (ref 39–117)
BUN: 23 mg/dL (ref 6–23)
Bilirubin, Direct: 0 mg/dL (ref 0.0–0.3)
Chloride: 102 meq/L (ref 96–112)
Creatinine, Ser: 1 mg/dL (ref 0.4–1.5)
GFR calc non Af Amer: 82.1 mL/min (ref >60)
Hgb A1c MFr Bld: 10 % — ABNORMAL HIGH (ref 4.6–6.5)
Total Protein: 6.9 g/dL (ref 6.0–8.3)

## 2009-07-14 ENCOUNTER — Ambulatory Visit: Payer: Self-pay | Admitting: Internal Medicine

## 2009-07-21 ENCOUNTER — Ambulatory Visit: Payer: Self-pay | Admitting: Internal Medicine

## 2009-07-28 ENCOUNTER — Ambulatory Visit: Payer: Self-pay | Admitting: Internal Medicine

## 2009-07-28 LAB — CONVERTED CEMR LAB
INR: 3.1
Prothrombin Time: 21.2 s

## 2009-08-02 IMAGING — CR DG ABDOMEN 1V
1 series · 1 of 1 positions shown · non-contrast
Comparison: 06/11/2008

CLINICAL DATA: Pre lithotripsy evaluation

ABDOMEN - 1 VIEW

[t abdomen supine *]
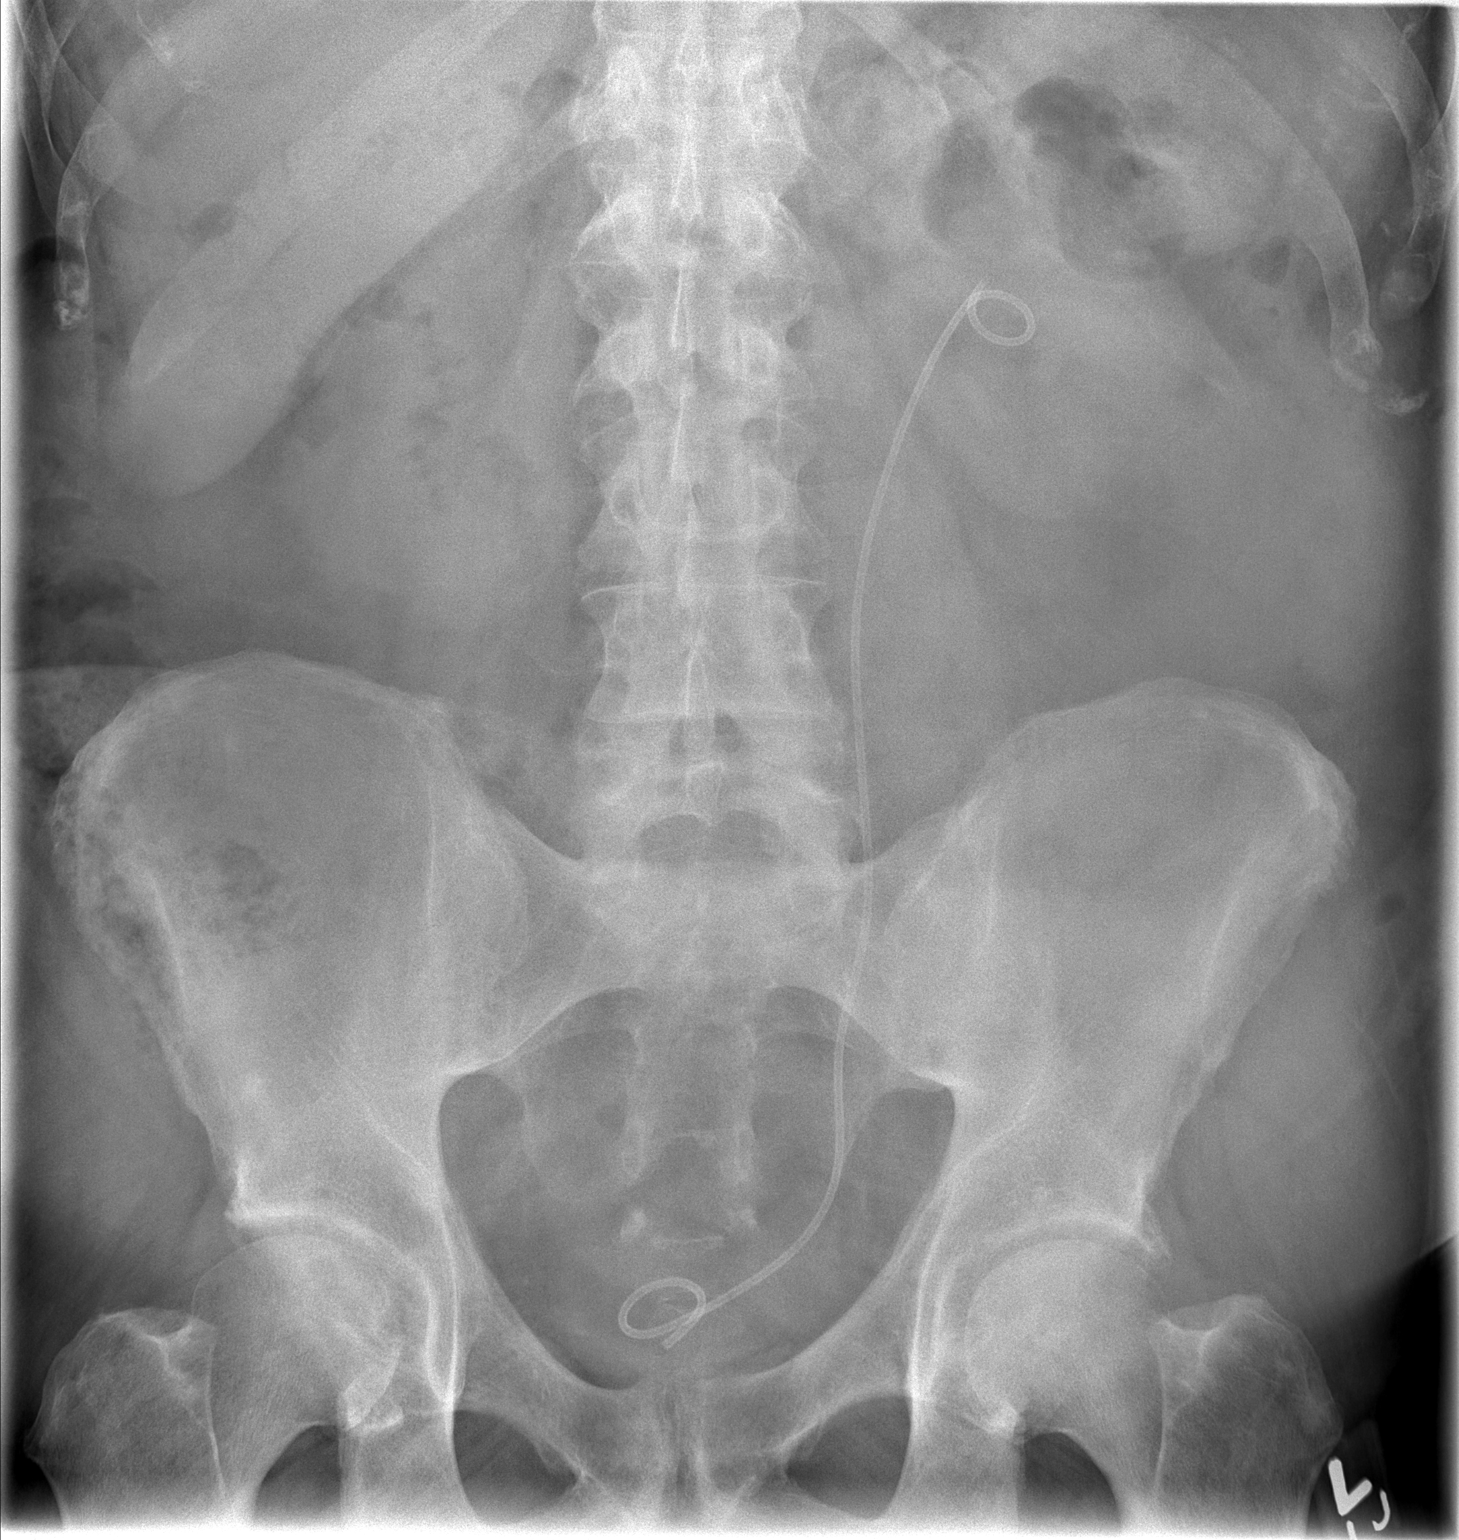

[1 of 1 positions shown; findings below may reference images not displayed]

FINDINGS: There has been interval placement of a left-sided Nephro
ureteral stent.

The left renal calculi has been pushed into the lower pole
collecting system of the left kidney.  This measures approximately
10.8 mm.

No right renal calculi identified.

No stones are identified along the course of either ureter.
IMPRESSION: 1.  Left lower pole stone measures 10.8 mm.

## 2009-08-11 ENCOUNTER — Ambulatory Visit: Payer: Self-pay | Admitting: Internal Medicine

## 2009-08-17 ENCOUNTER — Ambulatory Visit: Payer: Self-pay | Admitting: Family Medicine

## 2009-08-18 ENCOUNTER — Ambulatory Visit: Payer: Self-pay | Admitting: Oncology

## 2009-08-18 LAB — CONVERTED CEMR LAB
BUN: 20 mg/dL (ref 6–23)
CO2: 27 meq/L (ref 19–32)
Calcium: 9.3 mg/dL (ref 8.4–10.5)
GFR calc non Af Amer: 51.4 mL/min (ref >60)
Glucose, Bld: 267 mg/dL — ABNORMAL HIGH (ref 70–99)
Potassium: 4.3 meq/L (ref 3.5–5.1)
Sodium: 137 meq/L (ref 135–145)

## 2009-08-24 ENCOUNTER — Encounter: Payer: Self-pay | Admitting: Critical Care Medicine

## 2009-08-24 ENCOUNTER — Encounter (INDEPENDENT_AMBULATORY_CARE_PROVIDER_SITE_OTHER): Payer: Self-pay | Admitting: *Deleted

## 2009-08-24 LAB — CBC WITH DIFFERENTIAL/PLATELET
BASO%: 0.7 % (ref 0.0–2.0)
Basophils Absolute: 0 10*3/uL (ref 0.0–0.1)
EOS%: 3.4 % (ref 0.0–7.0)
HCT: 38 % — ABNORMAL LOW (ref 38.4–49.9)
HGB: 12.9 g/dL — ABNORMAL LOW (ref 13.0–17.1)
MCH: 28.1 pg (ref 27.2–33.4)
MCHC: 33.8 g/dL (ref 32.0–36.0)
MCV: 83.2 fL (ref 79.3–98.0)
MONO%: 1.6 % (ref 0.0–14.0)
NEUT%: 72.4 % (ref 39.0–75.0)

## 2009-09-02 ENCOUNTER — Telehealth: Payer: Self-pay | Admitting: Internal Medicine

## 2009-09-03 ENCOUNTER — Ambulatory Visit: Payer: Self-pay | Admitting: Family Medicine

## 2009-09-11 ENCOUNTER — Ambulatory Visit: Payer: Self-pay | Admitting: Internal Medicine

## 2009-09-11 LAB — CONVERTED CEMR LAB: INR: 2.1

## 2009-10-09 ENCOUNTER — Telehealth: Payer: Self-pay | Admitting: Internal Medicine

## 2009-10-15 ENCOUNTER — Ambulatory Visit: Payer: Self-pay | Admitting: Internal Medicine

## 2009-10-15 LAB — CONVERTED CEMR LAB: Prothrombin Time: 17.3 s

## 2009-11-05 ENCOUNTER — Telehealth: Payer: Self-pay | Admitting: Internal Medicine

## 2009-11-05 ENCOUNTER — Ambulatory Visit: Payer: Self-pay | Admitting: Family Medicine

## 2009-11-12 ENCOUNTER — Ambulatory Visit: Payer: Self-pay | Admitting: Internal Medicine

## 2009-11-12 LAB — CONVERTED CEMR LAB
Albumin: 3.7 g/dL (ref 3.5–5.2)
CO2: 27 meq/L (ref 19–32)
Calcium: 9.8 mg/dL (ref 8.4–10.5)
Cholesterol: 151 mg/dL (ref 0–200)
Creatinine, Ser: 1 mg/dL (ref 0.4–1.5)
Direct LDL: 71.2 mg/dL
GFR calc non Af Amer: 82 mL/min (ref >60)
Glucose, Bld: 224 mg/dL — ABNORMAL HIGH (ref 70–99)
HDL: 35.7 mg/dL — ABNORMAL LOW (ref >39.00)
Hgb A1c MFr Bld: 9.3 % — ABNORMAL HIGH (ref 4.6–6.5)
Sodium: 139 meq/L (ref 135–145)
Total Protein: 7 g/dL (ref 6.0–8.3)
Triglycerides: 385 mg/dL — ABNORMAL HIGH (ref 0.0–149.0)

## 2009-11-20 ENCOUNTER — Ambulatory Visit: Payer: Self-pay | Admitting: Internal Medicine

## 2009-11-20 LAB — CONVERTED CEMR LAB
INR: 2.1
Prothrombin Time: 17.9 s

## 2009-11-30 ENCOUNTER — Emergency Department (HOSPITAL_COMMUNITY): Admission: EM | Admit: 2009-11-30 | Discharge: 2009-11-30 | Payer: Self-pay | Admitting: Emergency Medicine

## 2009-12-03 ENCOUNTER — Ambulatory Visit: Payer: Self-pay | Admitting: Internal Medicine

## 2009-12-07 ENCOUNTER — Ambulatory Visit: Payer: Self-pay | Admitting: Cardiology

## 2009-12-10 ENCOUNTER — Ambulatory Visit: Payer: Self-pay | Admitting: Internal Medicine

## 2009-12-10 LAB — CONVERTED CEMR LAB

## 2009-12-21 ENCOUNTER — Encounter: Payer: Self-pay | Admitting: Cardiology

## 2009-12-21 ENCOUNTER — Encounter: Payer: Self-pay | Admitting: Critical Care Medicine

## 2009-12-28 ENCOUNTER — Encounter (HOSPITAL_COMMUNITY): Admission: RE | Admit: 2009-12-28 | Discharge: 2010-03-28 | Payer: Self-pay | Admitting: Cardiology

## 2010-01-05 ENCOUNTER — Ambulatory Visit: Payer: Self-pay | Admitting: Internal Medicine

## 2010-01-05 LAB — CONVERTED CEMR LAB
INR: 1.6
Prothrombin Time: 15.8 s

## 2010-01-18 ENCOUNTER — Telehealth: Payer: Self-pay | Admitting: Internal Medicine

## 2010-02-04 ENCOUNTER — Ambulatory Visit: Payer: Self-pay | Admitting: Internal Medicine

## 2010-02-04 LAB — CONVERTED CEMR LAB
ALT: 26 units/L (ref 0–53)
AST: 20 units/L (ref 0–37)
BUN: 34 mg/dL — ABNORMAL HIGH (ref 6–23)
CO2: 28 meq/L (ref 19–32)
Chloride: 102 meq/L (ref 96–112)
Cholesterol: 188 mg/dL (ref 0–200)
Creatinine, Ser: 0.9 mg/dL (ref 0.4–1.5)
Hgb A1c MFr Bld: 8.8 % — ABNORMAL HIGH (ref 4.6–6.5)
INR: 1.7
Potassium: 4.6 meq/L (ref 3.5–5.1)
Prothrombin Time: 15.9 s
Total Bilirubin: 0.3 mg/dL (ref 0.3–1.2)
Total Protein: 7.1 g/dL (ref 6.0–8.3)
VLDL: 97 mg/dL — ABNORMAL HIGH (ref 0.0–40.0)

## 2010-02-16 ENCOUNTER — Ambulatory Visit: Payer: Self-pay | Admitting: Internal Medicine

## 2010-02-16 ENCOUNTER — Telehealth: Payer: Self-pay | Admitting: Cardiology

## 2010-03-16 ENCOUNTER — Ambulatory Visit: Payer: Self-pay | Admitting: Internal Medicine

## 2010-03-16 LAB — CONVERTED CEMR LAB: INR: 2

## 2010-03-17 ENCOUNTER — Encounter: Payer: Self-pay | Admitting: Cardiology

## 2010-03-22 ENCOUNTER — Ambulatory Visit: Payer: Self-pay | Admitting: Cardiology

## 2010-03-22 ENCOUNTER — Encounter: Payer: Self-pay | Admitting: Internal Medicine

## 2010-03-29 ENCOUNTER — Encounter (HOSPITAL_COMMUNITY): Admission: RE | Admit: 2010-03-29 | Discharge: 2010-05-27 | Payer: Self-pay | Admitting: Cardiology

## 2010-04-04 ENCOUNTER — Ambulatory Visit: Payer: Self-pay | Admitting: Cardiology

## 2010-04-04 ENCOUNTER — Inpatient Hospital Stay (HOSPITAL_COMMUNITY): Admission: EM | Admit: 2010-04-04 | Discharge: 2010-04-07 | Payer: Self-pay | Admitting: Emergency Medicine

## 2010-04-07 ENCOUNTER — Encounter: Payer: Self-pay | Admitting: Cardiology

## 2010-04-09 ENCOUNTER — Ambulatory Visit: Payer: Self-pay | Admitting: Internal Medicine

## 2010-04-09 LAB — CONVERTED CEMR LAB: INR: 2.4

## 2010-04-14 ENCOUNTER — Ambulatory Visit: Payer: Self-pay | Admitting: Internal Medicine

## 2010-04-27 ENCOUNTER — Telehealth: Payer: Self-pay | Admitting: Cardiology

## 2010-04-28 ENCOUNTER — Ambulatory Visit: Payer: Self-pay | Admitting: Internal Medicine

## 2010-04-28 ENCOUNTER — Encounter: Payer: Self-pay | Admitting: Internal Medicine

## 2010-04-28 ENCOUNTER — Ambulatory Visit: Payer: Self-pay | Admitting: Cardiology

## 2010-04-28 ENCOUNTER — Telehealth: Payer: Self-pay | Admitting: Internal Medicine

## 2010-04-28 DIAGNOSIS — K649 Unspecified hemorrhoids: Secondary | ICD-10-CM | POA: Insufficient documentation

## 2010-04-28 DIAGNOSIS — D649 Anemia, unspecified: Secondary | ICD-10-CM

## 2010-04-28 HISTORY — DX: Anemia, unspecified: D64.9

## 2010-04-29 ENCOUNTER — Telehealth: Payer: Self-pay | Admitting: Internal Medicine

## 2010-05-03 ENCOUNTER — Ambulatory Visit: Payer: Self-pay | Admitting: Internal Medicine

## 2010-05-03 LAB — CONVERTED CEMR LAB: INR: 2.2

## 2010-05-12 ENCOUNTER — Ambulatory Visit: Payer: Self-pay | Admitting: Internal Medicine

## 2010-05-12 LAB — CONVERTED CEMR LAB: FSH: 3.9 milliintl units/mL (ref 1.4–18.1)

## 2010-05-13 LAB — CONVERTED CEMR LAB
AST: 16 units/L (ref 0–37)
BUN: 28 mg/dL — ABNORMAL HIGH (ref 6–23)
Basophils Relative: 0.6 % (ref 0.0–3.0)
Direct LDL: 89.5 mg/dL
Eosinophils Relative: 4.1 % (ref 0.0–5.0)
GFR calc non Af Amer: 87.92 mL/min (ref >60)
HDL: 26.9 mg/dL — ABNORMAL LOW (ref >39.00)
Hemoglobin: 12.8 g/dL — ABNORMAL LOW (ref 13.0–17.0)
Lymphocytes Relative: 22.1 % (ref 12.0–46.0)
Monocytes Relative: 1 % — ABNORMAL LOW (ref 3.0–12.0)
Neutro Abs: 3.1 10*3/uL (ref 1.4–7.7)
Potassium: 4.5 meq/L (ref 3.5–5.1)
RBC: 4.48 M/uL (ref 4.22–5.81)
Sodium: 136 meq/L (ref 135–145)
TSH: 1.4 microintl units/mL (ref 0.35–5.50)
Total Bilirubin: 0.5 mg/dL (ref 0.3–1.2)
Total CHOL/HDL Ratio: 6
VLDL: 101.8 mg/dL — ABNORMAL HIGH (ref 0.0–40.0)

## 2010-05-17 ENCOUNTER — Telehealth: Payer: Self-pay | Admitting: Internal Medicine

## 2010-05-20 ENCOUNTER — Ambulatory Visit: Payer: Self-pay | Admitting: Internal Medicine

## 2010-05-22 ENCOUNTER — Encounter: Payer: Self-pay | Admitting: Internal Medicine

## 2010-05-24 ENCOUNTER — Ambulatory Visit: Payer: Self-pay | Admitting: Cardiology

## 2010-05-24 ENCOUNTER — Telehealth: Payer: Self-pay | Admitting: Internal Medicine

## 2010-05-28 ENCOUNTER — Encounter: Payer: Self-pay | Admitting: Cardiology

## 2010-06-10 ENCOUNTER — Ambulatory Visit: Payer: Self-pay | Admitting: Internal Medicine

## 2010-06-10 LAB — CONVERTED CEMR LAB: INR: 2.2

## 2010-06-22 ENCOUNTER — Encounter: Payer: Self-pay | Admitting: Internal Medicine

## 2010-06-25 ENCOUNTER — Ambulatory Visit: Payer: Self-pay | Admitting: Internal Medicine

## 2010-06-28 LAB — CONVERTED CEMR LAB
Basophils Absolute: 0 10*3/uL (ref 0.0–0.1)
Eosinophils Absolute: 0.2 10*3/uL (ref 0.0–0.7)
HCT: 38 % — ABNORMAL LOW (ref 39.0–52.0)
Lymphs Abs: 0.9 10*3/uL (ref 0.7–4.0)
MCV: 83.3 fL (ref 78.0–100.0)
Monocytes Absolute: 0 10*3/uL — ABNORMAL LOW (ref 0.1–1.0)
Monocytes Relative: 1.2 % — ABNORMAL LOW (ref 3.0–12.0)
Platelets: 226 10*3/uL (ref 150.0–400.0)
RDW: 14.7 % — ABNORMAL HIGH (ref 11.5–14.6)
Vitamin B-12: 354 pg/mL (ref 211–911)

## 2010-07-05 ENCOUNTER — Telehealth: Payer: Self-pay | Admitting: Cardiology

## 2010-07-06 ENCOUNTER — Ambulatory Visit: Payer: Self-pay | Admitting: Cardiology

## 2010-07-12 ENCOUNTER — Telehealth (INDEPENDENT_AMBULATORY_CARE_PROVIDER_SITE_OTHER): Payer: Self-pay | Admitting: Radiology

## 2010-07-13 ENCOUNTER — Ambulatory Visit: Payer: Self-pay | Admitting: Cardiology

## 2010-07-13 ENCOUNTER — Encounter (HOSPITAL_COMMUNITY)
Admission: RE | Admit: 2010-07-13 | Discharge: 2010-09-25 | Payer: Self-pay | Source: Home / Self Care | Attending: Cardiology | Admitting: Cardiology

## 2010-07-13 ENCOUNTER — Encounter: Payer: Self-pay | Admitting: Internal Medicine

## 2010-07-13 ENCOUNTER — Inpatient Hospital Stay (HOSPITAL_COMMUNITY)
Admission: AD | Admit: 2010-07-13 | Discharge: 2010-07-17 | Disposition: A | Discharge status: Rehabilitation Facility | Payer: Self-pay | Source: Home / Self Care | Admitting: Cardiology

## 2010-07-13 ENCOUNTER — Ambulatory Visit: Payer: Self-pay

## 2010-07-13 ENCOUNTER — Ambulatory Visit: Payer: Self-pay | Admitting: Internal Medicine

## 2010-07-19 ENCOUNTER — Ambulatory Visit: Payer: Self-pay | Admitting: Internal Medicine

## 2010-07-19 LAB — CONVERTED CEMR LAB: POC INR: 2.8

## 2010-07-22 ENCOUNTER — Ambulatory Visit (HOSPITAL_COMMUNITY): Admission: RE | Admit: 2010-07-22 | Discharge: 2010-07-22 | Payer: Self-pay | Admitting: Cardiology

## 2010-07-26 ENCOUNTER — Ambulatory Visit: Payer: Self-pay | Admitting: Cardiology

## 2010-07-27 ENCOUNTER — Encounter: Payer: Self-pay | Admitting: Cardiology

## 2010-08-11 ENCOUNTER — Encounter: Payer: Self-pay | Admitting: Physician Assistant

## 2010-08-12 ENCOUNTER — Ambulatory Visit: Payer: Self-pay | Admitting: Physician Assistant

## 2010-08-12 ENCOUNTER — Telehealth: Payer: Self-pay | Admitting: Physician Assistant

## 2010-08-13 ENCOUNTER — Ambulatory Visit (HOSPITAL_COMMUNITY): Admission: RE | Admit: 2010-08-13 | Discharge: 2010-08-13 | Payer: Self-pay | Admitting: Physician Assistant

## 2010-08-20 ENCOUNTER — Ambulatory Visit: Payer: Self-pay | Admitting: Oncology

## 2010-08-23 ENCOUNTER — Encounter: Payer: Self-pay | Admitting: Cardiology

## 2010-08-23 ENCOUNTER — Encounter: Payer: Self-pay | Admitting: Physician Assistant

## 2010-08-23 ENCOUNTER — Encounter: Payer: Self-pay | Admitting: Internal Medicine

## 2010-08-23 LAB — PROTIME-INR
INR: 2.2 (ref 2.00–3.50)
Protime: 26.4 Seconds — ABNORMAL HIGH (ref 10.6–13.4)

## 2010-08-23 LAB — CBC WITH DIFFERENTIAL/PLATELET
Basophils Absolute: 0.1 10*3/uL (ref 0.0–0.1)
Eosinophils Absolute: 0.2 10*3/uL (ref 0.0–0.5)
HCT: 37.5 % — ABNORMAL LOW (ref 38.4–49.9)
HGB: 12.7 g/dL — ABNORMAL LOW (ref 13.0–17.1)
MCV: 81.7 fL (ref 79.3–98.0)
NEUT#: 2.8 10*3/uL (ref 1.5–6.5)
NEUT%: 69.6 % (ref 39.0–75.0)
RDW: 14.5 % (ref 11.0–14.6)
lymph#: 0.9 10*3/uL (ref 0.9–3.3)

## 2010-08-24 ENCOUNTER — Telehealth: Payer: Self-pay | Admitting: Cardiology

## 2010-08-26 ENCOUNTER — Encounter (HOSPITAL_COMMUNITY)
Admission: RE | Admit: 2010-08-26 | Discharge: 2010-10-26 | Payer: Self-pay | Source: Home / Self Care | Attending: Cardiology | Admitting: Cardiology

## 2010-08-26 ENCOUNTER — Telehealth: Payer: Self-pay | Admitting: Internal Medicine

## 2010-08-30 ENCOUNTER — Encounter: Payer: Self-pay | Admitting: Cardiology

## 2010-08-31 ENCOUNTER — Ambulatory Visit: Payer: Self-pay | Admitting: Internal Medicine

## 2010-08-31 LAB — CONVERTED CEMR LAB
ALT: 22 units/L (ref 0–53)
Alkaline Phosphatase: 113 units/L (ref 39–117)
Bilirubin, Direct: 0.1 mg/dL (ref 0.0–0.3)
CO2: 24 meq/L (ref 19–32)
Chloride: 104 meq/L (ref 96–112)
Sodium: 137 meq/L (ref 135–145)
Total Bilirubin: 0.6 mg/dL (ref 0.3–1.2)
Total CHOL/HDL Ratio: 7
Total Protein: 6.9 g/dL (ref 6.0–8.3)
VLDL: 61 mg/dL — ABNORMAL HIGH (ref 0.0–40.0)

## 2010-09-01 ENCOUNTER — Ambulatory Visit: Payer: Self-pay | Admitting: Internal Medicine

## 2010-09-01 ENCOUNTER — Telehealth: Payer: Self-pay | Admitting: Internal Medicine

## 2010-09-01 DIAGNOSIS — E119 Type 2 diabetes mellitus without complications: Secondary | ICD-10-CM

## 2010-09-01 DIAGNOSIS — K648 Other hemorrhoids: Secondary | ICD-10-CM | POA: Insufficient documentation

## 2010-09-01 DIAGNOSIS — E1165 Type 2 diabetes mellitus with hyperglycemia: Secondary | ICD-10-CM

## 2010-09-01 HISTORY — DX: Type 2 diabetes mellitus without complications: E11.9

## 2010-09-06 ENCOUNTER — Ambulatory Visit: Payer: Self-pay | Admitting: Cardiology

## 2010-09-06 LAB — CONVERTED CEMR LAB: POC INR: 3.4

## 2010-09-08 ENCOUNTER — Ambulatory Visit: Payer: Self-pay | Admitting: Cardiology

## 2010-09-08 ENCOUNTER — Telehealth: Payer: Self-pay | Admitting: Cardiology

## 2010-09-08 ENCOUNTER — Encounter: Payer: Self-pay | Admitting: Cardiology

## 2010-09-09 ENCOUNTER — Encounter: Payer: Self-pay | Admitting: Cardiology

## 2010-09-09 ENCOUNTER — Ambulatory Visit: Payer: Self-pay

## 2010-09-10 ENCOUNTER — Ambulatory Visit: Payer: Self-pay | Admitting: Internal Medicine

## 2010-09-13 LAB — CONVERTED CEMR LAB
Basophils Relative: 0.9 % (ref 0.0–3.0)
HCT: 38.3 % — ABNORMAL LOW (ref 39.0–52.0)
Hemoglobin: 13.2 g/dL (ref 13.0–17.0)
Lymphocytes Relative: 21.9 % (ref 12.0–46.0)
Lymphs Abs: 1.1 10*3/uL (ref 0.7–4.0)
MCHC: 34.5 g/dL (ref 30.0–36.0)
Monocytes Relative: 1.2 % — ABNORMAL LOW (ref 3.0–12.0)
Neutro Abs: 3.6 10*3/uL (ref 1.4–7.7)
RBC: 4.56 M/uL (ref 4.22–5.81)

## 2010-09-15 ENCOUNTER — Ambulatory Visit: Payer: Self-pay | Admitting: Internal Medicine

## 2010-09-22 ENCOUNTER — Ambulatory Visit: Payer: Self-pay | Admitting: Cardiology

## 2010-09-28 ENCOUNTER — Ambulatory Visit: Admission: RE | Admit: 2010-09-28 | Discharge: 2010-09-28 | Payer: Self-pay | Source: Home / Self Care

## 2010-09-28 LAB — CONVERTED CEMR LAB: POC INR: 2.9

## 2010-10-06 ENCOUNTER — Encounter: Payer: Self-pay | Admitting: Internal Medicine

## 2010-10-14 ENCOUNTER — Encounter: Payer: Self-pay | Admitting: Cardiology

## 2010-10-19 ENCOUNTER — Ambulatory Visit: Admission: RE | Admit: 2010-10-19 | Discharge: 2010-10-19 | Payer: Self-pay | Source: Home / Self Care

## 2010-10-19 ENCOUNTER — Ambulatory Visit: Admit: 2010-10-19 | Payer: Self-pay | Admitting: Internal Medicine

## 2010-10-19 LAB — CONVERTED CEMR LAB: POC INR: 2.7

## 2010-10-24 LAB — CONVERTED CEMR LAB
Basophils Relative: 0.7 % (ref 0.0–3.0)
Eosinophils Relative: 4.5 % (ref 0.0–5.0)
Hemoglobin: 12.2 g/dL — ABNORMAL LOW (ref 13.0–17.0)
INR: 4.2 — ABNORMAL HIGH (ref 0.8–1.0)
Lymphocytes Relative: 21.1 % (ref 12.0–46.0)
MCV: 83.8 fL (ref 78.0–100.0)
Neutro Abs: 3 10*3/uL (ref 1.4–7.7)
Neutrophils Relative %: 72.7 % (ref 43.0–77.0)
RBC: 4.29 M/uL (ref 4.22–5.81)
WBC: 4.2 10*3/uL — ABNORMAL LOW (ref 4.5–10.5)

## 2010-10-26 ENCOUNTER — Ambulatory Visit: Admit: 2010-10-26 | Payer: Self-pay | Admitting: Internal Medicine

## 2010-10-27 ENCOUNTER — Ambulatory Visit: Payer: Self-pay | Admitting: Adult Health

## 2010-10-27 ENCOUNTER — Ambulatory Visit (HOSPITAL_COMMUNITY): Payer: Self-pay

## 2010-10-28 NOTE — Progress Notes (Signed)
Summary: NUC PRE-PROCEDURE  Phone Note Outgoing Call   Call placed by: Burna Mortimer Deal  Call placed to: Patient Reason for Call: Confirm/change Appt Summary of Call: Left message with information on Myoview Information Sheet (see scanned document for details).  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

## 2010-10-28 NOTE — Assessment & Plan Note (Signed)
Summary: PT // RS  Nurse Visit   Allergies: 1)  Feldene (Piroxicam) Laboratory Results   Blood Tests      INR: 2.2   (Normal Range: 0.88-1.12   Therap INR: 2.0-3.5) Comments: Rita Ohara  May 03, 2010 12:29 PM     Orders Added: 1)  Est. Patient Level I [99211] 2)  Protime [16109UE]   ANTICOAGULATION RECORD PREVIOUS REGIMEN & LAB RESULTS Anticoagulation Diagnosis:  v58.83,v58.61,415.19 on  12/03/2009 Previous INR Goal Range:  2.5-3.5 on  12/10/2009 Previous INR:  4.2 ratio on  04/28/2010 Previous Coumadin Dose(mg):  5mg  on m,w,f 7.5mg  other days on  02/16/2010 Previous Regimen:  same on  04/14/2010 Previous Coagulation Comments:  Missed 1 day on  12/03/2009  NEW REGIMEN & LAB RESULTS Current INR: 2.2 Regimen: same  Repeat testing in: 4 weeks  Anticoagulation Visit Questionnaire Coumadin dose missed/changed:  No Abnormal Bleeding Symptoms:  No  Any diet changes including alcohol intake, vegetables or greens since the last visit:  No Any illnesses or hospitalizations since the last visit:  No Any signs of clotting since the last visit (including chest discomfort, dizziness, shortness of breath, arm tingling, slurred speech, swelling or redness in leg):  No  MEDICATIONS CETIRIZINE HCL 10 MG TABS (CETIRIZINE HCL) Take 1 tablet by mouth once a day METOPROLOL TARTRATE 25 MG TABS (METOPROLOL TARTRATE) Take 1 tablet by mouth two times a day NITROQUICK 0.4 MG SUBL (NITROGLYCERIN) Place 1 tablet under tongue as needed PLAVIX 75 MG TABS (CLOPIDOGREL BISULFATE) Take 1 tablet by mouth once a day LEVEMIR FLEXPEN 100 UNIT/ML SOLN (INSULIN DETEMIR) 60units subcutaneously two times a day or as directed CRESTOR 20 MG  TABS (ROSUVASTATIN CALCIUM) 1 by mouth once daily BENICAR 40 MG  TABS (OLMESARTAN MEDOXOMIL) 1 by mouth once daily FREESTYLE LITE TEST  STRP (GLUCOSE BLOOD) use two to three times a day as directed NOVOLIN N 100 UNIT/ML SUSP (INSULIN ISOPHANE HUMAN) 30 Units  with meals and at bedtime BD U/F SHORT PEN NEEDLE 31G X 8 MM MISC (INSULIN PEN NEEDLE) use as directed CHLORTHALIDONE 25 MG TABS (CHLORTHALIDONE) Take one-half  tablet by mouth daily CITALOPRAM HYDROBROMIDE 20 MG TABS (CITALOPRAM HYDROBROMIDE) take one tablet by mouth daily TRAZODONE HCL 50 MG TABS (TRAZODONE HCL) 1/2 - 1 by mouth at bedtime as needed insomnia WARFARIN SODIUM 5 MG TABS (WARFARIN SODIUM) Use as directed by Anticoagulation Clinic OMEPRAZOLE 20 MG CPDR (OMEPRAZOLE) one by mouth daily * UROCRIT Take 1 tablet by mouth three times a day with meals FISH OIL 1000 MG CAPS (OMEGA-3 FATTY ACIDS) 3 once daily FLORA-Q  CAPS (PROBIOTIC PRODUCT) 2 by mouth two times a day GLUCOPHAGE 1000 MG TABS (METFORMIN HCL) 1/2 tablet two times a day ANUSOL-HC 25 MG SUPP (HYDROCORTISONE ACETATE) 1 per rectum nightly for 1 week then as needed for bleeding hemorrhoids

## 2010-10-28 NOTE — Medication Information (Signed)
Summary: Coumadin Clinic  Anticoagulant Therapy  Managed by: Weston Brass, PharmD Referring MD: Shawnie Pons, MD PCP: Birdie Sons, MD  Supervising MD: Riley Kill MD, Maisie Fus Indication 1: v58.83,v58.61,415.19 Indication 2: Factor V def Lab Used: [facility] [place] Site: [address] PT 26.4 INR POC 2.2 INR RANGE 2.5-3.5  Dietary changes: no    Health status changes: no    Bleeding/hemorrhagic complications: yes       Details: had some bleeding with hemrroids.  has cleared some since decreasing asa dose  Recent/future hospitalizations: no    Any changes in medication regimen? no    Recent/future dental: no  Any missed doses?: no       Is patient compliant with meds? yes       Allergies: 1)  ! Vioxx 2)  Feldene (Piroxicam)  Anticoagulation Management History:      His anticoagulation is being managed by telephone today.  Positive risk factors for bleeding include presence of serious comorbidities.  Negative risk factors for bleeding include an age less than 31 years old and no history of CVA/TIA.  The bleeding index is 'intermediate risk'.  Positive CHADS2 values include History of HTN and History of Diabetes.  Negative CHADS2 values include Age > 51 years old and Prior Stroke/CVA/TIA.  His last INR was 2.2.  Prothrombin time is 26.4.  Anticoagulation responsible provider: Riley Kill MD, Maisie Fus.  INR POC: 2.2.  Exp: 07/2011.    Anticoagulation Management Assessment/Plan:      The patient's current anticoagulation dose is Warfarin sodium 5 mg tabs: Use as directed by Anticoagulation Clinic.  The target INR is 2.5-3.5.  The next INR is due 09/06/2010.  Anticoagulation instructions were given to patient.  Results were reviewed/authorized by Weston Brass, PharmD.  He was notified by Weston Brass PharmD.         Prior Anticoagulation Instructions: INR = 3.2, within goal.  Continue taking 1.5 tablets (7.5 mg) on Sunday, Monday, Wednesday and Fridays, and take 1 tablet  (5mg ) on Tuesday, Thursday and Saturdays.  Recheck INR in 4 weeks.  Current Anticoagulation Instructions: INR 2.2  Take 2 tablets today then resume same dose of 1 1/2 tablets every day except 1 tablet on Tuesday, Thursday and Saturday.  Recheck INR in 2 weeks.

## 2010-10-28 NOTE — Assessment & Plan Note (Signed)
Summary: eph per matt call/dx:post PTCA   Visit Type:  Follow-up/ eph Primary Provider:  Birdie Sons MD  CC:  since angio, has been very tired in the afternoons, and has been feeling twinges on left side chest.  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  His main concern today is bright red blood from his rectum every time he uses the bathroom since the time of discharge. He was on plavix and coumadin before this recent admission and did not have this problem. He was put on aspirin after hopsital discharge for 2 weeks and is wonderimg whether that is causing him to bleed. The last colonoscopy was done 2 years ago and everything was normal.  He c/o being more tired then usual recently and is only able to exercise 10-15 mins at a time instead of 30 mins a day.  He also c/o cramp like sensation in his left chest area which is unrelated to exertion.   He denies any new chest pain episodes, no fevers, no chills, urinary concerns. No recent changes in appetite, weight.   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 Take 1 Tablet By Mouth Once A Day 2)  Metoprolol Tartrate 25 Mg Tabs (Metoprolol Tartrate) .... Take 1 Tablet By Mouth Two Times A Day 3)  Nitroquick 0.4 Mg Subl (Nitroglycerin) .... Place 1 Tablet Under Tongue As Needed 4)  Plavix 75 Mg Tabs (Clopidogrel Bisulfate) .... Take 1 Tablet By Mouth Once A Day 5)  Levemir Flexpen 100 Unit/ml Soln (Insulin Detemir) .... 60units Subcutaneously Two Times A Day or As Directed 6)  Crestor 20 Mg  Tabs (Rosuvastatin Calcium) .Marland Kitchen.. 1 By Mouth Once Daily 7)  Benicar 40 Mg  Tabs (Olmesartan Medoxomil) .Marland Kitchen.. 1 By Mouth Once Daily 8)  Freestyle Lite Test  Strp (Glucose Blood) .... Use Two To Three Times A Day As Directed 9)  Novolin N 100 Unit/ml Susp (Insulin Isophane Human) .... 30 Units With Meals and At Bedtime 10)  Bd U/f Short Pen Needle 31g X 8 Mm Misc (Insulin Pen Needle) .... Use As Directed 11)  Chlorthalidone 25 Mg Tabs (Chlorthalidone) .... Take One-Half  Tablet By Mouth Daily 12)  Citalopram Hydrobromide 20 Mg Tabs (Citalopram Hydrobromide) .... Take One Tablet By Mouth Daily 13)  Trazodone Hcl 50 Mg Tabs (Trazodone Hcl) .... 1/2 - 1 By Mouth At Bedtime As Needed Insomnia 14)  Warfarin  Sodium 5 Mg Tabs (Warfarin Sodium) .... Use As Directed By Anticoagulation Clinic 15)  Omeprazole 20 Mg Cpdr (Omeprazole) .... One By Mouth Daily 16)  Urocrit .... Take 1 Tablet By Mouth Three Times A Day With Meals 17)  Fish Oil 1000 Mg Caps (Omega-3 Fatty Acids) .... 3 Once Daily 18)  Flora-Q  Caps (Probiotic Product) .... 2 By Mouth Two Times A Day 19)  Glucophage 1000 Mg Tabs (Metformin Hcl) .... 1/2 Tablet Two Times A Day  Current Medications (verified): 1)  Cetirizine Hcl 10 Mg Tabs (Cetirizine Hcl) .... Take 1 Tablet By Mouth Once A Day 2)   Metoprolol Tartrate 25 Mg Tabs (Metoprolol Tartrate) .... Take 1 Tablet By Mouth Two Times A Day 3)  Nitroquick 0.4 Mg Subl (Nitroglycerin) .... Place 1 Tablet Under Tongue As Needed 4)  Plavix 75 Mg Tabs (Clopidogrel Bisulfate) .... Take 1 Tablet By Mouth Once A Day 5)  Levemir Flexpen 100 Unit/ml Soln (Insulin Detemir) .... 60units Subcutaneously Two Times A Day or As Directed 6)  Crestor 20 Mg  Tabs (Rosuvastatin Calcium) .Marland Kitchen.. 1 By Mouth Once Daily 7)  Benicar 40 Mg  Tabs (Olmesartan Medoxomil) .Marland Kitchen.. 1 By Mouth Once Daily 8)  Freestyle Lite Test  Strp (Glucose Blood) .... Use Two To Three Times A Day As Directed 9)  Novolin N 100 Unit/ml Susp (Insulin Isophane Human) .... 30 Units With Meals and At Bedtime 10)  Bd U/f Short Pen Needle 31g X 8 Mm Misc (Insulin Pen Needle) .... Use As Directed 11)  Chlorthalidone 25 Mg Tabs (Chlorthalidone) .... Take One-Half  Tablet By Mouth Daily 12)  Citalopram Hydrobromide 20 Mg Tabs (Citalopram Hydrobromide) .... Take One Tablet By Mouth Daily 13)  Trazodone Hcl 50 Mg Tabs (Trazodone Hcl) .... 1/2 - 1 By Mouth At Bedtime As Needed Insomnia 14)  Warfarin Sodium 5 Mg Tabs (Warfarin Sodium) .... Use As Directed By Anticoagulation Clinic 15)  Omeprazole 20 Mg Cpdr (Omeprazole) .... One By Mouth Daily 16)  Urocrit .... Take 1 Tablet By Mouth Three Times A Day With Meals 17)  Fish Oil 1000 Mg Caps (Omega-3 Fatty Acids) .... 3 Once Daily 18)  Flora-Q  Caps (Probiotic Product) .... 2 By Mouth Two Times A Day 19)  Glucophage 1000 Mg Tabs (Metformin Hcl) .... 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.3) ESOPHAGEAL STRICTURE  (ICD-530.3) DUODENITIS, WITHOUT HEMORRHAGE (ICD-535.60) SLEEP APNEA, OBSTRUCTIVE (ICD-327.23) DIABETES MELLITUS, TYPE II, UNCONTROLLED (ICD-250.02) GERD (ICD-530.81) Pulmonary embolism, hx of Nephrolithiasis, hx of Depression  Past Surgical History: Last updated: 08/15/2008 Colonoscopy-03/06/2007 EGD-10/03/2002 RCA Stent x 2 Knee arthroscopy Angioplasty - 2005 Knee Arthroscopy- 2007 PTCA/stent---2009  Family History: Last updated: 07/14/2009 Family History Diabetes 1st degree relative brother deceased-dm, htn, chf Family History of Coronary Artery Disease:  mother pulmonary embolus 2010 (factor V deficiency)  Social History: Last updated: 01/16/2009 Married Never Smoked Full Time Alcohol Use - no Drug Use - no  Risk Factors: Exercise: no (06/08/2007)  Risk Factors: Smoking Status: never (03/16/2010)  Family History: Reviewed history from 07/14/2009 and no changes required. Family History Diabetes 1st degree relative brother deceased-dm, htn, chf Family History of Coronary Artery Disease:  mother pulmonary embolus 2010 (factor V deficiency)  Social History: Reviewed history from 01/16/2009 and no changes required. Married Never Smoked  Full Time Alcohol Use - no Drug Use - no  Review of Systems      See HPI  Vital Signs:  Patient profile:   60 year old male Height:      71 inches Weight:      269 pounds BMI:     37.65 Pulse rate:   66 / minute BP sitting:   120 / 72  (left arm)  Vitals Entered By: Burnett Kanaris, CNA (April 28, 2010 10:40 AM)  Physical Exam  Additional Exam:  Gen: AOx3, in no acute distress Eyes: PERRL, EOMI ENT:MMM, No erythema noted in posterior pharynx Neck: No JVD, No LAP Chest: CTAB with  good respiratory effort CVS: regular rhythmic rate, NO M/R/G, S1 S2 normal Abdo: soft,ND, BS+x4, Non tender and No hepatosplenomegaly EXT: No odema noted Neuro: Non focal, gait is normal Skin: no rashes noted.  RECTAL exam: No blood  noted on rectal exam but as soon as we retracted our finger, he started having fresh bleed, possible hemmorhoids,rectal tear.   Cardiac Cath  Procedure date:  04/06/2010  Findings:      Left main coronary artery had a 30% ostial stenosis.   Left anterior descending artery had 20% to 30% multiple discrete lesions in proximal mid and distal portions.  First diagonal branch had 20% to 30% multiple discrete lesions.   Circumflex coronary artery had intermediate branch with 30% to 40% multiple discrete lesions.  There was a small obtuse marginal branch with 30% multiple discrete lesions.  There was a stent in the midportion of the circumflex which was widely patent.  First obtuse marginal branch had 30% multiple discrete lesions.   Right coronary artery was large and dominant.  There were 30% multiple discrete lesions in the proximal portion.  There was a large area of the mid and distal vessel covered by stent.  The distal portion of the overlapping stents had a 90% discrete stenosis.  PDA had 30% to 40% multiple discrete lesions.  Posterolateral branch had 30% multiple discrete lesions.  Cardiac Cath  Procedure date:  04/06/2010  Findings:      IMPRESSION:  Successful percutaneous coronary intervention with cutting balloon angioplasty of the area of severe in-stent restenosis in the mid right coronary artery.   RECOMMENDATIONS:  Continue current cardiac management, which includes aspirin and Plavix.      EKG  Procedure date:  04/28/2010  Findings:      NSR.  LVH.  Nonspecific ST and T wave abnormality.   Impression & Recommendations:  Problem # 1:  RECTAL BLEEDING (ICD-569.3) Assessment New  Patient will need a CBC and PT today to assess the amount of bleeding. We will get him a GI referral today for assessment of his GI bleed. He has factor V deficiency and a DES and needs to be on Coumadin and Plavix. We will refer to GI's note for better planning.  Has rectal  irritation, and needs to get this evaluated. RTC in 3 weeks.   Orders: TLB-CBC Platelet - w/Differential (85025-CBCD) TLB-PT (Protime) (85610-PTP)  Problem # 2:  CORONARY ARTERY DISEASE (ICD-414.00) Assessment: Unchanged Nonnew chest pain, SOB, and had recent angioplasty done for instent restenosis at site of stent..  Successful cutting balloon procedure.  We will cancer his 2Decho and TMT for now and will see him on Aug 29th. Continue current meds and keep on working on weight loss. His updated medication list for this problem includes:    Metoprolol Tartrate 25 Mg Tabs (Metoprolol  tartrate) .Marland Kitchen... Take 1 tablet by mouth two times a day    Nitroquick 0.4 Mg Subl (Nitroglycerin) .Marland Kitchen... Place 1 tablet under tongue as needed    Plavix 75 Mg Tabs (Clopidogrel bisulfate) .Marland Kitchen... Take 1 tablet by mouth once a day    Warfarin Sodium 5 Mg Tabs (Warfarin sodium) ..... Use as directed by anticoagulation clinic  Problem # 3:  PULMONARY EMBOLISM (ICD-415.19) History of PE.  Recent repeat cath with PCI done for instent restenosis.  See above His updated medication list for this problem includes:    Plavix 75 Mg Tabs (Clopidogrel bisulfate) .Marland Kitchen... Take 1 tablet by mouth once a day    Warfarin Sodium 5 Mg Tabs (Warfarin sodium) ..... Use as directed by anticoagulation clinic  Orders: TLB-CBC Platelet - w/Differential (85025-CBCD) TLB-PT (Protime) (85610-PTP)  Problem # 4:  FACTOR V DEFICIENCY (ICD-286.3) Must remain on warfarin at this point.   Orders: TLB-CBC Platelet - w/Differential (85025-CBCD) TLB-PT (Protime) (85610-PTP)  Other Orders: EKG w/ Interpretation (93000)  Patient Instructions: 1)  Your physician recommends that you have lab work today: CBC, INR 2)  We will arrange appointment with GI. 3)  Your physician recommends that you continue on your current medications as directed. Please refer to the Current Medication list given to you today. 4)  Your physician recommends that you  schedule a follow-up appointment 05/24/10 at 3:30  Appended Document: eph per matt call/dx:post PTCA Please revise ECG:  NSR with first degree AV block.  No evidence of LVH as noted.  Left axis deviation.  ST and T waves are normal.TS

## 2010-10-28 NOTE — Progress Notes (Signed)
Summary: Sob when walking   Phone Note Call from Patient Call back at Home Phone [phone]   Caller: Patient Summary of Call: Pt having sob when walkinhg or doing rehab Initial call taken by: Judie Grieve,  Feb 16, 2010 4:29 PM  Follow-up for Phone Call        I spoke with the pt and he saw Dr Cato Mulligan today and did mention some SOB when walking 5-6 minutes.  Dr Cato Mulligan recommended follow-up with Dr Riley Kill and the pt is due to see Cardiology in July.  An appt was scheduled for 03/31/10.  The pt said he has been in the maintenance program [facility] since April and has noticed when he walks the track or gets on the treadmill he gets SOB and his heart rate increases about 5-6 minutes into exercise.  The pt can exercise on all other equipment without any SOB or symptoms. I have moved the pt to 03/22/10 at this time to see Dr Riley Kill.  I instructed the pt to call our office if his symptoms worsen. Pt agrees with plan.    Follow-up by: Julieta Gutting, RN, BSN,  Feb 16, 2010 4:40 PM

## 2010-10-28 NOTE — Medication Information (Signed)
Summary: [name]  Anticoagulant Therapy  Managed by: Cloyde Reams, RN, BSN Referring MD: Shawnie Pons, MD PCP: Birdie Sons, MD  Supervising MD: Johney Frame MD, Fayrene Fearing Indication 1: v58.83,v58.61,415.19 Indication 2: Factor V def Lab Used: [facility] [place] Site: [address] INR RANGE 2.5-3.5  Dietary changes: no    Health status changes: no    Bleeding/hemorrhagic complications: no    Recent/future hospitalizations: no    Any changes in medication regimen? no    Recent/future dental: no  Any missed doses?: no       Is patient compliant with meds? yes       Allergies: 1)  ! Vioxx 2)  Feldene (Piroxicam)  Anticoagulation Management History:      Positive risk factors for bleeding include presence of serious comorbidities.  Negative risk factors for bleeding include an age less than 55 years old and no history of CVA/TIA.  The bleeding index is 'intermediate risk'.  Positive CHADS2 values include History of HTN and History of Diabetes.  Negative CHADS2 values include Age > 32 years old and Prior Stroke/CVA/TIA.  His last INR was 2.2.  Anticoagulation responsible provider: Allred MD, Fayrene Fearing.  Cuvette Lot#: [id number].  Exp: 07/2011.    Anticoagulation Management Assessment/Plan:      The patient's current anticoagulation dose is Warfarin sodium 5 mg tabs: Use as directed by Anticoagulation Clinic.  The target INR is 2.5-3.5.  The next INR is due 08/23/2010.  Results were reviewed/authorized by Cloyde Reams, RN, BSN.         Prior Anticoagulation Instructions: INR 2.8  Discontinue Lovenox, start taking 7.5mg  daily except 5mg  on Tuesdays, Thursdays, and Saturdays.  Recheck in 1 week.   Current Anticoagulation Instructions: INR = 3.2, within goal.  Continue taking 1.5 tablets (7.5 mg) on Sunday, Monday, Wednesday and Fridays, and take 1 tablet (5mg ) on Tuesday, Thursday and Saturdays.  Recheck INR in 4 weeks.

## 2010-10-28 NOTE — Medication Information (Signed)
Summary: protime & P2Y12/jml send to Dr STuckey/jml  Anticoagulant Therapy  Managed by: Cloyde Reams, RN, BSN Referring MD: Shawnie Pons, MD PCP: Birdie Sons, MD  Supervising MD: Johney Frame MD, Fayrene Fearing Indication 1: v58.83,v58.61,415.19 Indication 2: Factor V def Lab Used: [facility] [place] Site: [address] INR POC 2.8 INR RANGE 2.5-3.5  Dietary changes: no    Health status changes: no    Bleeding/hemorrhagic complications: yes       Details: BRB per rectum, not new has hx of   Recent/future hospitalizations: yes       Details: Discharged on 07/17/10 s/p stent on 07/15/10.   Any changes in medication regimen? yes       Details: Currently on Lovenox   Recent/future dental: no  Any missed doses?: no       Is patient compliant with meds? yes      Comments: Currently on Lovenox.   INR on 07/17/10 1.7, discharged on 7.5mg  daily.    Allergies: 1)  ! Vioxx 2)  Feldene (Piroxicam)  Anticoagulation Management History:      The patient is taking warfarin and comes in today for a routine follow up visit.  Positive risk factors for bleeding include presence of serious comorbidities.  Negative risk factors for bleeding include an age less than 38 years old and no history of CVA/TIA.  The bleeding index is 'intermediate risk'.  Positive CHADS2 values include History of HTN and History of Diabetes.  Negative CHADS2 values include Age > 52 years old and Prior Stroke/CVA/TIA.  His last INR was 2.2.  Anticoagulation responsible provider: Nyiah Pianka MD, Fayrene Fearing.  INR POC: 2.8.  Cuvette Lot#: [id number].  Exp: 08/2011.    Anticoagulation Management Assessment/Plan:      The patient's current anticoagulation dose is Warfarin sodium 5 mg tabs: Use as directed by Anticoagulation Clinic.  The target INR is 2.5-3.5.  The next INR is due 07/26/2010.  Results were reviewed/authorized by Cloyde Reams, RN, BSN.  He was notified by Cloyde Reams RN.         Current Anticoagulation  Instructions: INR 2.8  Discontinue Lovenox, start taking 7.5mg  daily except 5mg  on Tuesdays, Thursdays, and Saturdays.  Recheck in 1 week.

## 2010-10-28 NOTE — Assessment & Plan Note (Signed)
Summary: f36m   Visit Type:  6 months follow up Primary Provider:  Birdie Sons MD  CC:  Lef side pain.  History of Present Illness: Stable. Does not feel as good with Coumadin.  Has not felt as good.  He has had a low sugar event.  Still has left side pain.  He did take NTG.  His left leg still hurts.  Went to [facility] last Monday night.  He left because he had not been seen in about two hours.  He has had intermittent discomfort.  Sugars have been running high, and adjustments have been made.  He would like to get back into cardiac rehab program and is willing to pay for it.  However, it is clear he would benefit.  Interuppted by PE.   Has had trouble with INR.   Current Medications (verified): 1)  Cetirizine Hcl 10 Mg Tabs (Cetirizine Hcl) .... Take 1 Tablet By Mouth Once A Day 2)  Metoprolol Tartrate 25 Mg Tabs (Metoprolol Tartrate) .... Take 1 Tablet By Mouth Two Times A Day 3)  Nitroquick 0.4 Mg Subl (Nitroglycerin) .... Place 1 Tablet Under Tongue As Needed 4)  Plavix 75 Mg Tabs (Clopidogrel Bisulfate) .... Take 1 Tablet By Mouth Once A Day 5)  Levemir Flexpen 100 Unit/ml Soln (Insulin Detemir) .... 55 Units Subcutaneously Two Times A Day or As Directed 6)  Crestor 20 Mg  Tabs (Rosuvastatin Calcium) .Marland Kitchen.. 1 By Mouth Once Daily 7)  Benicar 40 Mg  Tabs (Olmesartan Medoxomil) .Marland Kitchen.. 1 By Mouth Once Daily 8)  Freestyle Lite Test  Strp (Glucose Blood) .... Use Two Times A Day As Directed 9)  Fish Oil 1200 Mg  Caps (Omega-3 Fatty Acids) .... Once Daily 10)  Red Yeast Rice 600 Mg  Caps (Red Yeast Rice Extract) .... Once Daily 11)  Novolin N 100 Unit/ml Susp (Insulin Isophane Human) .... 20 Units With Meals 12)  Bd U/f Short Pen Needle 31g X 8 Mm Misc (Insulin Pen Needle) .... Use As Directed 13)  Chlorthalidone 25 Mg Tabs (Chlorthalidone) .... Take One-Half  Tablet By Mouth Daily 14)  Citalopram Hydrobromide 20 Mg Tabs (Citalopram Hydrobromide) .... Take One Tablet By Mouth Daily 15)   Trazodone Hcl 50 Mg Tabs (Trazodone Hcl) .... 1/2 - 1 By Mouth At Bedtime As Needed Insomnia 16)  Metformin Hcl 1000 Mg Tabs (Metformin Hcl) .... Take 1/2  Tablet By Mouth Two Times A Day 17)  Warfarin Sodium 5 Mg Tabs (Warfarin Sodium) .... Take 1 Tablet By Mouth At Bedtime 18)  Omeprazole 20 Mg Cpdr (Omeprazole) .... One By Mouth Daily  Allergies: 1)  Feldene (Piroxicam)  Vital Signs:  Patient profile:   60 year old male Height:      71 inches Weight:      265 pounds BMI:     37.09 Pulse rate:   78 / minute Pulse rhythm:   irregular Resp:     18 per minute BP sitting:   120 / 70  (left arm) Cuff size:   large  Vitals Entered By: Vikki Ports (December 07, 2009 12:03 PM)  Physical Exam  General:  Overweight gentleman in no distress.   Head:  normocephalic and atraumatic Eyes:  PERRLA/EOM intact; conjunctiva and lids normal. Lungs:  Clear bilaterally to auscultation and percussion. Heart:  PMI non displaced.  Normal S1 and S2.  Without murmur or rub.   Abdomen:  No masses noted.  Slight tenderness over left axilla Pulses:  pulses  normal in all 4 extremities Extremities:  No clubbing or cyanosis. Neurologic:  Alert and oriented x 3.   EKG  Procedure date:  12/07/2009  Findings:      NSR with first degree AV block.  Leftward axis.  No ST and T abnormalities.  Impression & Recommendations:  Problem # 1:  CORONARY ARTERY DISEASE (ICD-414.00) I think this is stable.  Symptoms do not suggest ischemia. although he will need to keep [place] informed if symptoms change.  Needs labs done, and need to check on INR in Coumadin clinic.  Going for labs at Dr. Cato Mulligan in another day or two.  His updated medication list for this problem includes:    Metoprolol Tartrate 25 Mg Tabs (Metoprolol tartrate) .Marland Kitchen... Take 1 tablet by mouth two times a day    Nitroquick 0.4 Mg Subl (Nitroglycerin) .Marland Kitchen... Place 1 tablet under tongue as needed    Plavix 75 Mg Tabs (Clopidogrel bisulfate) .Marland Kitchen... Take 1  tablet by mouth once a day    Warfarin Sodium 5 Mg Tabs (Warfarin sodium) .Marland Kitchen... 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  Needs long term therapy. His updated medication list for this problem includes:    Plavix 75 Mg Tabs (Clopidogrel bisulfate) .Marland Kitchen... Take 1 tablet by mouth once a day    Warfarin Sodium 5 Mg Tabs (Warfarin sodium) .Marland Kitchen... Take 1 tablet by mouth at bedtime  Problem # 3:  HYPERLIPIDEMIA (ICD-272.4) Due for lipid and liver trial. His updated medication list for this problem includes:    Crestor 20 Mg Tabs (Rosuvastatin calcium) .Marland Kitchen... 1 by mouth once daily  Orders: EKG w/ Interpretation (93000)  Patient Instructions: 1)  Your physician recommends that you continue on your current medications as directed. Please refer to the Current Medication list given to you today. 2)  Your physician wants you to follow-up in:   4 MONTHS. You will receive a reminder letter in the mail two months in advance. If you don't receive a letter, please call our office to schedule the follow-up appointment. 3)  Your physician recommends referral and attendance at a Cardiac Rehab Program.

## 2010-10-28 NOTE — Progress Notes (Signed)
  Phone Note Call from Patient Call back at Home Phone [phone]   Caller: Patient Call For: Birdie Sons MD Summary of Call: C/o sinus headache, bloody mucous from nose, little cough, ears full since Sunday. No fever. Saline spray not helping. [facility] Initial call taken by: Raechel Ache, RN,  November 05, 2009 8:55 AM  Follow-up for Phone Call        called pt and he refused ov with another md- states he will wait for dr Latonya Nelon for next week Follow-up by: Willy Eddy, LPN,  November 05, 2009 10:08 AM  Additional Follow-up for Phone Call Additional follow up Details #1::        appt today with Dr Caryl Never @2 :45 Additional Follow-up by: Raechel Ache, RN,  November 05, 2009 10:25 AM

## 2010-10-28 NOTE — Assessment & Plan Note (Signed)
Summary: Bleeding hemorrhoids, Factor 5/LRH   History of Present Illness Visit Type: Follow-up Visit Primary GI MD: Stan Head MD [facility] Primary Provider: Birdie Sons, MD  Requesting Provider: Shawnie Pons, MD  Chief Complaint: rectal bleeding hemorrhoids x 2 months  not getting better History of Present Illness:   PLEASANT 60 YO MALE KNOWN TO DR. Leone Payor WITH HX OF INTERNAL HEMORRHOIDS. COLONOSCOPY WAS DONE IN 2008  FOR FOLLOW UP OF HYPERPLASTIC POLYPS,AND WAS NEGATIVE  EXCEPT GRADE 1  INTERNAL HEMORRHOIDS.  HE HAS MULTIPLE OTHER SERIOUSS MEDICAL PROBLEMS  ;JUST HAD OVERLAPPING STENTS PLACED IN HIS RCA IN OCTOBER,AND IS NOW ON EFFIENT.  HE ALSO HAS A FACTOR 5 LEIDEN  DEFICIENCY ,HX OF PE, AND IS ON COUMADIN AND ASA AS WELL.   HE HAS BEEN HAVING INTERMITTENT BLEEDING FROM HIS HEMORRHOIDS OVER THE PAST COUPLE MONTHS. HE SAYS EVER SINCE HE HAD THE STENTS PLACED HIS BM'S HAVE BEEN LESS REGULAR  WITH MORE STRAINING,HARD AND OR PASTY STOOLS. HE HAS NO INTERNAL RECTAL PAIN, BUT HAS DIFFICULTY CLEANING HIMSELF , AND ALSO HAS  A PAINFUL AREA ON THE OUTSIDE  OF THE ANUS  ON THE RIGHT WHICH BURNS AND STINGS.   GI Review of Systems    Reports abdominal pain.      Denies acid reflux, belching, bloating, chest pain, dysphagia with liquids, dysphagia with solids, heartburn, loss of appetite, nausea, vomiting, vomiting blood, weight loss, and  weight gain.      Reports change in bowel habits, hemorrhoids, and  rectal bleeding.     Denies anal fissure, black tarry stools, constipation, diarrhea, diverticulosis, fecal incontinence, heme positive stool, irritable bowel syndrome, jaundice, light color stool, liver problems, and  rectal pain.    Current Medications (verified): 1)  Cetirizine Hcl 10 Mg Tabs (Cetirizine Hcl) .... Take 1 Tablet By Mouth Once A Day 2)  Metoprolol Tartrate 25 Mg Tabs (Metoprolol Tartrate) .... Take 1 Tablet By Mouth Two Times A Day 3)  Nitroquick 0.4 Mg Subl (Nitroglycerin) ....  Place 1 Tablet Under Tongue As Needed 4)  Levemir Flexpen 100 Unit/ml Soln (Insulin Detemir) .... 60units Subcutaneously Two Times A Day or As Directed 5)  Crestor 20 Mg  Tabs (Rosuvastatin Calcium) .Marland Kitchen.. 1 By Mouth Once Daily 6)  Benicar 40 Mg  Tabs (Olmesartan Medoxomil) .Marland Kitchen.. 1 By Mouth Once Daily 7)  Freestyle Lite Test  Strp (Glucose Blood) .... Use Two To Three Times A Day As Directed 8)  Novolin N 100 Unit/ml Susp (Insulin Isophane Human) .... 30 Units Up To Three Times A Day 9)  Bd U/f Short Pen Needle 31g X 8 Mm Misc (Insulin Pen Needle) .... Use As Directed 10)  Chlorthalidone 25 Mg Tabs (Chlorthalidone) .... Take One-Half  Tablet By Mouth Daily 11)  Citalopram Hydrobromide 20 Mg Tabs (Citalopram Hydrobromide) .... Take One Tablet By Mouth Daily 12)  Trazodone Hcl 50 Mg Tabs (Trazodone Hcl) .... 1/2 - 1 By Mouth At Bedtime As Needed Insomnia 13)  Warfarin Sodium 5 Mg Tabs (Warfarin Sodium) .... Use As Directed By Anticoagulation Clinic 14)  Urocrit .... Take 1 Tablet By Mouth Three Times A Day With Meals 15)  Fish Oil 1000 Mg Caps (Omega-3 Fatty Acids) .... 3 Once Daily 16)  Align   Caps (Misc Intestinal Flora Regulat) .... Take One Capsule By Mouth Daily 17)  Glucophage 1000 Mg Tabs (Metformin Hcl) .... 1/2 Tablet Two Times A Day 18)  Anusol-Hc 25 Mg Supp (Hydrocortisone Acetate) .Marland Kitchen.. 1 Per Rectum Nightly For  1 Week Then As Needed For Bleeding Hemorrhoids 19)  Effient 10 Mg Tabs (Prasugrel Hcl) .... Take One Tablet By Mouth Daily 20)  Aspirin 81 Mg Tbec (Aspirin) .... Take One Tablet By Mouth Daily  Allergies (verified): 1)  ! Vioxx 2)  Feldene (Piroxicam)  Past History:  Past Medical History: RENAL ARTERY STENOSIS (ICD-440.1) CORONARY ARTERY DISEASE (ICD-414.00)     a.  s/p DES to RCA x 2 in 2005    b.  s/p DES to CFX 2009    c.  s/p PTCA of RCA for in stent restenosis 03/2010    d.  s/p Promus DES x 2 RCA 07/16/2010(recurrent pain with abnormal Myoview July 13, 2010)           --residual at cath 07/16/2010:  LAD 30%; CFX 30%; RCA 30% ISR HYPERLIPIDEMIA (ICD-272.4) HYPERTENSION (ICD-401.9) OBESITY-MORBID (>100') (ICD-278.01) PSORIASIS (ICD-696.1) ESOPHAGEAL STRICTURE (ICD-530.3) DUODENITIS, WITHOUT HEMORRHAGE (ICD-535.60) SLEEP APNEA, OBSTRUCTIVE (ICD-327.23) DIABETES MELLITUS, TYPE II, UNCONTROLLED (ICD-250.02) GERD (ICD-530.81) Pulmonary embolism, hx of Nephrolithiasis, hx of Depression Factor V Leiden deficiency INT. HEMORRHOIDS  Past Surgical History: Reviewed history from 04/28/2010 and no changes required. Colonoscopy-03/06/2007 EGD-10/03/2002 RCA Stent x 2 Knee arthroscopy Angioplasty - 2005 & 2011 Knee Arthroscopy- 2007 PTCA/stent---2009  Family History: Reviewed history from 04/28/2010 and no changes required. Family History Diabetes 1st degree relative brother deceased-dm, htn, chf Family History of Coronary Artery Disease:  mother pulmonary embolus 2010 (factor V deficiency) No FH of Colon Cancer:  Social History: Reviewed history from 04/28/2010 and no changes required. [employer] Married 2 children  Patient 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  Alcohol Use - no Daily Caffeine Use: one daily  Illicit Drug Use - no  Review of Systems  The patient denies allergy/sinus, anemia, anxiety-new, arthritis/joint pain, back pain, blood in urine, breast changes/lumps, change in vision, confusion, cough, coughing up blood, depression-new, fainting, fatigue, fever, headaches-new, hearing problems, heart murmur, heart rhythm changes, itching, muscle pains/cramps, night sweats, nosebleeds, shortness of breath, skin rash, sleeping problems, sore throat, swelling of feet/legs, swollen lymph glands, thirst - excessive, urination - excessive, urination changes/pain, urine leakage, vision changes, and voice change.         SEE HPI  Vital Signs:  Patient profile:   60 year old male Height:      71 inches Weight:      266 pounds BMI:     37.23 Pulse  rate:   96 / minute Pulse rhythm:   regular BP sitting:   108 / 74  (left arm)  Vitals Entered By: Milford Cage NCMA (September 01, 2010 11:03 AM)  Physical Exam  General:  Well developed, well nourished, no acute distress. Head:  Normocephalic and atraumatic. Eyes:  PERRLA, no icterus. Lungs:  Clear throughout to auscultation. Heart:  Regular rate and rhythm; no murmurs, rubs,  or bruits. Abdomen:  LARGE SOFT,HE HAS A VENTRAL HERNIA WHICH IS SENSITIVE, AND MILD LEFT MID QUADRANT TENDERNESS TO LIGHT PALPATION, NO MASS OR HSM,BS+ Rectal:  TINY EXCORIATION ON RIGHT PERIEAL AREA, AND ONE OTHER EXCORITAION POSTERIORLY, NO EXTERNAL HEMORRHOID,NO PALPABLE INTERNAL LESION,STOOL BROWN ,TRACE + Extremities:  No clubbing, cyanosis, edema or deformities noted. Neurologic:  Alert and  oriented x4;  grossly normal neurologically. Psych:  Alert and cooperative. Normal mood and affect.   Impression & Recommendations:  Problem # 1:  RECTAL BLEEDING (ICD-569.3) Assessment Deteriorated 59 YO MALE ON ASA,COUMADIN AND EFFIENT FOR CAD/STENTS, AND FACTOR 5 LEIDEN DEFICIENCY WITH PERSISTENT INTERMITTENT  SMALL VOLUME HEMATOCHEZIA SECONDARY  TO INTERNAL HEMORRHOIDS. HE ALSO HAS EXTERNAL PERINEAL IRRITATION , CHANGE IN BOWEL HABITS SINCE OFF OMEPRAZOLE, AND ON EFFIENT  ADD MIRALAX 17 GM DAILY OR every other day ZINC OXIDE PASTE OR BALMEX CREAM TO PERINEAL AREA FOR SKN IRRITATION. ANUSOL HC SUPP at bedtime X 7 DAYS THE AS NEEDED. REVIEWED RECTAL CARE-IE,MOIST WIPES, BALNEOL LOTION. FOLLOW UP WITH DR. Leone Payor IN ONE MONTH. WE DISCUSSED SURGICAL OPINION-HE IS CONTENT WITH MEDICAL MANAGEMENT AT THIS TIME.  Problem # 2:  COUMADIN THERAPY (ICD-V58.61) Assessment: Comment Only  Problem # 3:  FACTOR V DEFICIENCY (ICD-286.3) Assessment: Comment Only  Problem # 4:  CORONARY ARTERY DISEASE (ICD-414.00) Assessment: Comment Only  Problem # 5:  DIABETES MELLITUS-TYPE II (ICD-250.00) Assessment: Comment  Only  Problem # 6:  PULMONARY EMBOLISM (ICD-415.19) Assessment: Comment Only  Patient Instructions: 1)  We [facility] prescription for Anusol HC Suppositories to [facility] [address]  2)  Get Zinc Oxide or Balmex lotion at  the pharmacy, use on irritated skin.  3)  Use baby wipes or moistened tissue after a bowel movement.Marland Kitchen  4)  Make a follow up appointment with Dr Leone Payor in 3-4 weeks. 5)  Copy sent to : Shawnie Pons, MD 6)  The medication list was reviewed and reconciled.  All changed / newly prescribed medications were explained.  A complete medication list was provided to the patient / caregiver. Prescriptions: ANUSOL-HC 25 MG SUPP (HYDROCORTISONE ACETATE) Use 1 suppository at bedtime x 7 days then as needed  #7 x 4   Entered by:   Lowry Ram NCMA   Authorized by:   Sammuel Cooper PA-c   Signed by:   Lowry Ram NCMA on 09/01/2010   Method used:   [facility]        [facility]  [address] [phone][facility] (retail)       [address]       [place], [place]  [address]       Ph: [phone]       Fax: [phone]   RxID:   [id number]

## 2010-10-28 NOTE — Assessment & Plan Note (Signed)
Summary: EPH. GD   Visit Type:  eph Primary Provider:  Birdie Sons, MD   CC:  no complaints.  History of Present Illness: Primary Cardiologist:  Dr. Shawnie Pons  [name] is a 60 year old male with a history of coronary artery disease, status post multiple prior percutaneous coronary interventions in the past as well as factor V Leiden deficiency who is on chronic Coumadin therapy who was recently admitted from the office with an abnormal Myoview study.  He had 95% RCA stenosis that was treated with 2 Promus drug-eluting stents.  His platelet inhibition was low and he was placed on Effient plus full dose aspirin in addition to his coumadin.  Of note, his followup P2Y12 was 34% on 10/27 (38% on 10/22).  His INR was 2.8 on 07/26/2010.  He is now off Lovenox bridging.  He returns for follow up.  He denies any further chest discomfort or jaw discomfort.  He does note some dyspnea with walking.  This is a chronic symptom without change.  He denies orthopnea or PND.  He denies significant pedal edema.  He denies syncope.  He has followed up with the Coumadin clinic.  His INR is therapeutic.  He does have a history of hemorrhoidal bleeding.  At first, he noted epistaxis as well as hemorrhoidal bleeding when he left the hospital.  He decreased his aspirin on his own from 365 mg to 162 mg a day.  His epistaxis has resolved.  His hemorrhoidal bleeding has slowed down.  He denies lightheadedness, dizziness or fatigue.  Current Medications (verified): 1)  Cetirizine Hcl 10 Mg Tabs (Cetirizine Hcl) .... Take 1 Tablet By Mouth Once A Day 2)  Metoprolol Tartrate 25 Mg Tabs (Metoprolol Tartrate) .... Take 1 Tablet By Mouth Two Times A Day 3)  Nitroquick 0.4 Mg Subl (Nitroglycerin) .... Place 1 Tablet Under Tongue As Needed 4)  Levemir Flexpen 100 Unit/ml Soln (Insulin Detemir) .... 60units Subcutaneously Two Times A Day or As Directed 5)  Crestor 20 Mg  Tabs (Rosuvastatin Calcium) .Marland Kitchen.. 1 By Mouth Once  Daily 6)  Benicar 40 Mg  Tabs (Olmesartan Medoxomil) .Marland Kitchen.. 1 By Mouth Once Daily 7)  Freestyle Lite Test  Strp (Glucose Blood) .... Use Two To Three Times A Day As Directed 8)  Novolin N 100 Unit/ml Susp (Insulin Isophane Human) .... 30 Units Up To Three Times A Day 9)  Bd U/f Short Pen Needle 31g X 8 Mm Misc (Insulin Pen Needle) .... Use As Directed 10)  Chlorthalidone 25 Mg Tabs (Chlorthalidone) .... Take One-Half  Tablet By Mouth Daily 11)  Citalopram Hydrobromide 20 Mg Tabs (Citalopram Hydrobromide) .... Take One Tablet By Mouth Daily 12)  Trazodone Hcl 50 Mg Tabs (Trazodone Hcl) .... 1/2 - 1 By Mouth At Bedtime As Needed Insomnia 13)  Warfarin Sodium 5 Mg Tabs (Warfarin Sodium) .... Use As Directed By Anticoagulation Clinic 14)  Omeprazole 20 Mg Cpdr (Omeprazole) .... One By Mouth Daily 15)  Urocrit .... Take 1 Tablet By Mouth Three Times A Day With Meals 16)  Fish Oil 1000 Mg Caps (Omega-3 Fatty Acids) .... 3 Once Daily 17)  Align   Caps (Misc Intestinal Flora Regulat) .... Take One Capsule By Mouth Daily 18)  Glucophage 1000 Mg Tabs (Metformin Hcl) .... 1/2 Tablet Two Times A Day 19)  Anusol-Hc 25 Mg Supp (Hydrocortisone Acetate) .Marland Kitchen.. 1 Per Rectum Nightly For 1 Week Then As Needed For Bleeding Hemorrhoids 20)  Effient 10 Mg Tabs (Prasugrel Hcl) .... Take  One Tablet By Mouth Daily  Allergies (verified): 1)  ! Vioxx 2)  Feldene (Piroxicam)  Past History:  Past Medical History: RENAL ARTERY STENOSIS (ICD-440.1) CORONARY ARTERY DISEASE (ICD-414.00)     a.  s/p DES to RCA x 2 in 2005    b.  s/p DES to CFX 2009    c.  s/p PTCA of RCA for in stent restenosis 03/2010    d.  s/p Promus DES x 2 RCA 07/16/2010(recurrent pain with abnormal Myoview July 13, 2010)          --residual at cath 07/16/2010:  LAD 30%; CFX 30%; RCA 30% ISR HYPERLIPIDEMIA (ICD-272.4) HYPERTENSION (ICD-401.9) OBESITY-MORBID (>100') (ICD-278.01) PSORIASIS (ICD-696.1) ESOPHAGEAL STRICTURE (ICD-530.3) DUODENITIS,  WITHOUT HEMORRHAGE (ICD-535.60) SLEEP APNEA, OBSTRUCTIVE (ICD-327.23) DIABETES MELLITUS, TYPE II, UNCONTROLLED (ICD-250.02) GERD (ICD-530.81) Pulmonary embolism, hx of Nephrolithiasis, hx of Depression Factor V Leiden deficiency  Review of Systems       He stopped his omeprazole on his own.  He denies any indigestion symptoms. He notes bilateral leg pain with exertion that improves with rest.  Vital Signs:  Patient profile:   60 year old male Height:      71 inches Weight:      266.25 pounds BMI:     37.27 Pulse rate:   79 / minute BP sitting:   92 / 63  (left arm) Cuff size:   regular  Vitals Entered By: Caralee Ates CMA (August 12, 2010 8:28 AM)  Physical Exam  General:  Well nourished, well developed, in no acute distress HEENT: normal Neck: no JVD Cardiac:  normal S1, S2; RRR; no murmur Lungs:  clear to auscultation bilaterally, no wheezing, rhonchi or rales Abd: soft, nontender, no hepatomegaly Ext: no edema; R radial site without hematoma or bruit Vascular: DP/PT are difficult to palpate bilaterally Skin: warm and dry Neuro:  CNs 2-12 intact, no focal abnormalities noted    EKG  Procedure date:  08/12/2010  Findings:      Normal sinus rhythm Heart rate 75 First-degree AV block-PR interval 224 ms Left axis deviation Nonspecific ST-T wave changes  Impression & Recommendations:  Problem # 1:  CORONARY ARTERY DISEASE (ICD-414.00)  P2Y12 was 38% on 10/22 with repeat test on 10/27 being 34%. He is on Effient and ASA 162 mg as well as coumadin for hypercoagulable state. He has had some rectal bleeding from his hemorrhoids and decided to cut back his ASA on his own from 325 to 162 mg. He is doing well without anginal symptoms.  Continue current regimen. Check f/u P2Y12.  I will review with Dr. Riley Kill regarding ASA 162 mg vs. 81 mg once daily. He is going to cardiac rehab. Follow up with Dr. Riley Kill in 6 weeks.  Orders: EKG w/ Interpretation  (93000)  Problem # 2:  FACTOR V DEFICIENCY (ICD-286.3)  On coumadin.  Problem # 3:  COUMADIN THERAPY (ICD-V58.61)  Therapeutic INR in coumadin clinic recently.  Problem # 4:  HYPERTENSION (ICD-401.9)  Controlled.  Problem # 5:  HYPERLIPIDEMIA (ICD-272.4) Followed by PCP.  His updated medication list for this problem includes:    Crestor 20 Mg Tabs (Rosuvastatin calcium) .Marland Kitchen... 1 by mouth once daily  Problem # 6:  LEG PAIN, BILATERAL (ICD-729.5)  He has some arthritic pain.  However, he does have leg pain with exercise that relieves with rest. He is at risk for PAD. Will get resting and exercise ABIs.  Orders: Arterial Duplex Lower Extremity (Arterial Duplex Low)  Problem # 7:  HEMORRHOIDS, WITH BLEEDING (ICD-455.8)  Check CBC with dual antiplatelet therapy + coumadin.  Problem # 8:  GERD (ICD-530.81)  He is conerned about the potential interaction of PPIs with medications like Plavix. His symptoms are stable.  I suggest he try Pepcid 20 mg two times a day as needed.  Patient Instructions: 1)  Your physician recommends that you schedule a follow-up appointment in: 6 weeks with Dr. Riley Kill. 2)  Your physician has requested that you have an ankle brachial index (ABI). During this test an ultrasound and blood pressure cuff are used to evaluate the arteries that supply the arms and legs with blood. Allow thirty minutes for this exam. There are no restrictions or special instructions. 3)  Your physician has recommended you make the following change in your medication: Take Pepcid 20 mg one tablet twice a day as needed instead of Omeprazole. Take 2/ 81 mg  aspirin a day.  4)  Your physician recommends that you return for lab work in: Today CBC, P2Y12 at [facility]. Prescription given.

## 2010-10-28 NOTE — Progress Notes (Signed)
Summary: FYI  Phone Note Call from Patient   Caller: Patient Call For: Birdie Sons MD Summary of Call: Pt is calling to let Dr. Cato Mulligan know he has continued diarrhea and abdominal pain. [phone] Initial call taken by: Lynann Beaver CMA,  January 18, 2010 11:07 AM  Follow-up for Phone Call        stop the metformin.  Have him call in one week with results of blood sugar and whether her diarrhea has resolved. Follow-up by: Birdie Sons MD,  January 18, 2010 12:03 PM  Additional Follow-up for Phone Call Additional follow up Details #1::        Left detailed message on pt's personal voice mail. Additional Follow-up by: Lynann Beaver CMA,  January 18, 2010 12:17 PM

## 2010-10-28 NOTE — Assessment & Plan Note (Signed)
Summary: gi issues-then to pt//ccm   Vital Signs:  Patient profile:   60 year old male Weight:      266 pounds Temp:     98 degrees F Pulse rate:   90 / minute BP sitting:   116 / 76  (left arm)  Vitals Entered By: Gladis Riffle, RN (October 15, 2009 9:40 AM)   Primary Care Provider:  Birdie Sons MD   History of Present Illness: multiple complaints-- GI complaints---diarrhea, belching, flatulence---always clears up with "antibiotic"  now with sinus congestion---5 days---no fever or chills,  has had some cough---productive mucous  All other systems reviewed and were negative   Preventive Screening-Counseling & Management  Alcohol-Tobacco     Smoking Status: never  Allergies: 1)  Feldene (Piroxicam)  Comments:  Nurse/Medical Assistant: c/o GI issues x 4-5 months, some better with antibiotic, but some bleeding--now c/o chest congestion with cough; taking mucinex DM and antibiotic--c/i chills today  The patient's medications and allergies were reviewed with the patient and were updated in the Medication and Allergy Lists. Gladis Riffle, RN (October 15, 2009 9:41 AM)  Past History:  Past Medical History: Last updated: 02/27/2009 RENAL ARTERY STENOSIS (ICD-440.1) CORONARY ARTERY DISEASE (ICD-414.00) HYPERLIPIDEMIA (ICD-272.4) HYPERTENSION (ICD-401.9) OBESITY-MORBID (>100') (ICD-278.01) PSORIASIS (ICD-696.1) COLONIC POLYPS, ADENOMATOUS (ICD-211.3) ESOPHAGEAL STRICTURE (ICD-530.3) DUODENITIS, WITHOUT HEMORRHAGE (ICD-535.60) SLEEP APNEA, OBSTRUCTIVE (ICD-327.23) DIABETES MELLITUS, TYPE II, UNCONTROLLED (ICD-250.02) GERD (ICD-530.81) Pulmonary embolism, hx of Nephrolithiasis, hx of Depression  Past Surgical History: Last updated: 08/15/2008 Colonoscopy-03/06/2007 EGD-10/03/2002 RCA Stent x 2 Knee arthroscopy Angioplasty - 2005 Knee Arthroscopy- 2007 PTCA/stent---2009  Family History: Last updated: 07/14/2009 Family History Diabetes 1st degree relative brother  deceased-dm, htn, chf Family History of Coronary Artery Disease:  mother pulmonary embolus 2010 (factor V deficiency)  Social History: Last updated: 01/16/2009 Married Never Smoked Full Time Alcohol Use - no Drug Use - no  Risk Factors: Exercise: no (06/08/2007)  Risk Factors: Smoking Status: never (10/15/2009)  Review of Systems       All other systems reviewed and were negative   Physical Exam  General:  Well-developed,well-nourished,in no acute distress; alert,appropriate and cooperative throughout examination Head:  normocephalic and atraumatic.   Eyes:  pupils equal and pupils round.   Ears:  R ear normal and L ear normal.   Neck:  supple no adenopathy Chest Wall:  No deformities, masses, tenderness or gynecomastia noted. Lungs:  Normal respiratory effort, chest expands symmetrically. Lungs are clear to auscultation, no crackles or wheezes. Heart:  normal rate, regular rhythm, and no murmur.   Abdomen:  patient has umbilical hernia which is soft and nontender. Normal bowel sounds. Soft minimal tenderness left lower quadrant to deep palpation. No guarding or rebound. No organomegaly noted Msk:  No deformity or scoliosis noted of thoracic or lumbar spine.   Neurologic:  cranial nerves II-XII intact and gait normal.     Impression & Recommendations:  Problem # 1:  DIARRHEA, ACUTE (ICD-787.91)  resolved but acute sxs intermittently trial probiotic side effects discussed  His updated medication list for this problem includes:    Pro-flora Concentrate Caps (Probiotic product) .Marland Kitchen... 1 tab three times a day with meals  Problem # 2:  PULMONARY EMBOLISM (ICD-415.19) discussed pradaxa will continued warfarin as pradaxa not indicated for thromboembolic dzs at this time His updated medication list for this problem includes:    Plavix 75 Mg Tabs (Clopidogrel bisulfate) .Marland Kitchen... Take 1 tablet by mouth once a day    Warfarin Sodium 5 Mg Tabs (Warfarin sodium) .Marland KitchenMarland KitchenMarland KitchenMarland Kitchen  Take 1  tablet by mouth at bedtime  Problem # 3:  CORONARY ARTERY DISEASE (ICD-414.00)  no sxs continue current medications  His updated medication list for this problem includes:    Metoprolol Tartrate 25 Mg Tabs (Metoprolol tartrate) .Marland Kitchen... Take 1 tablet by mouth two times a day    Nitroquick 0.4 Mg Subl (Nitroglycerin) .Marland Kitchen... Place 1 tablet under tongue as needed    Plavix 75 Mg Tabs (Clopidogrel bisulfate) .Marland Kitchen... Take 1 tablet by mouth once a day    Benicar 40 Mg Tabs (Olmesartan medoxomil) .Marland Kitchen... 1 by mouth once daily    Chlorthalidone 25 Mg Tabs (Chlorthalidone) .Marland Kitchen... 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 1 by mouth once daily    Novolin N 100 Unit/ml Susp (Insulin isophane human) .Marland KitchenMarland KitchenMarland KitchenMarland Kitchen 18 units with meals    Metformin Hcl 1000 Mg Tabs (Metformin hcl) .Marland Kitchen... Take 1 tablet by mouth two times a day  Labs Reviewed: Creat: 1.5 (08/17/2009)     Last Eye Exam: normal--pt's report (09/26/2008) Reviewed HgBA1c results: 10.0 (07/09/2009)  8.8 (03/12/2009)  Problem # 6:  HYPERLIPIDEMIA (ICD-272.4)  His updated medication list for this problem includes:    Crestor 20 Mg Tabs (Rosuvastatin calcium) .Marland Kitchen... 1 by mouth once daily  Complete Medication List: 1)  Cetirizine Hcl 10 Mg Tabs (Cetirizine hcl) .... Take 1 tablet by mouth once a day 2)  Metoprolol Tartrate 25 Mg Tabs (Metoprolol tartrate) ....  Take 1 tablet by mouth two times a day 3)  Nitroquick 0.4 Mg Subl (Nitroglycerin) .... Place 1 tablet under tongue as needed 4)  Plavix 75 Mg Tabs (Clopidogrel bisulfate) .... Take 1 tablet by mouth once a day 5)  Levemir Flexpen 100 Unit/ml Soln (Insulin detemir) .... 55 units subcutaneously two times a day or as directed 6)  Crestor 20 Mg Tabs (Rosuvastatin calcium) .Marland Kitchen.. 1 by mouth once daily 7)  Benicar 40 Mg Tabs (Olmesartan medoxomil) .Marland Kitchen.. 1 by mouth once daily 8)  Freestyle Lite Test Strp (Glucose blood) .... Use two times a day as directed 9)  Fish Oil 1200 Mg Caps (Omega-3 fatty acids) .... Once daily 10)  Red Yeast Rice 600 Mg Caps (Red yeast rice extract) .... Once daily 11)  Novolin N 100 Unit/ml Susp (Insulin isophane human) .Marland Kitchen.. 18 units with meals 12)  Bd U/f Short Pen Needle 31g X 8 Mm Misc (Insulin pen needle) .... Use as directed 13)  Chlorthalidone 25 Mg Tabs (Chlorthalidone) .... Take one-half  tablet by mouth daily 14)  Citalopram Hydrobromide 20 Mg Tabs (Citalopram hydrobromide) .... Take one tablet by mouth daily 15)  Trazodone Hcl 50 Mg Tabs (Trazodone hcl) .... 1/2 - 1 by mouth at bedtime as needed insomnia 16)  Metformin Hcl 1000 Mg Tabs (Metformin hcl) .... Take 1 tablet by mouth two times a day 17)  Warfarin Sodium 5 Mg Tabs (Warfarin sodium) .Marland KitchenMarland KitchenMarland Kitchen  Take 1 tablet by mouth at bedtime 18)  Omeprazole 20 Mg Cpdr (Omeprazole) .... One by mouth daily 19)  Metronidazole 250 Mg Tabs (Metronidazole) .... One three times a day for 5 days 20)  Pro-flora Concentrate Caps (Probiotic product) .Marland Kitchen.. 1 tab three times a day with meals  Other Orders: Protime ([id number])  Patient Instructions: 1)  .   ANTICOAGULATION RECORD PREVIOUS REGIMEN & LAB RESULTS   Previous INR:  2.1 on  09/11/2009  Previous Regimen:  same on  09/11/2009  NEW REGIMEN & LAB RESULTS Current INR: 2.0 Regimen: same   Anticoagulation Visit Questionnaire Coumadin dose missed/changed:  No Abnormal  Bleeding Symptoms:  Yes    Bruising or bleeding from nose or gums, in urine or stool since the last visit:  blood in stool  Any diet changes including alcohol intake, vegetables or greens since the last visit:  No Any illnesses or hospitalizations since the last visit:  No Any signs of clotting since the last visit (including chest discomfort, dizziness, shortness of breath, arm tingling, slurred speech, swelling or redness in leg):  No  MEDICATIONS CETIRIZINE HCL 10 MG TABS (CETIRIZINE HCL) Take 1 tablet by mouth once a day METOPROLOL TARTRATE 25 MG TABS (METOPROLOL TARTRATE) Take 1 tablet by mouth two times a day NITROQUICK 0.4 MG SUBL (NITROGLYCERIN) Place 1 tablet under tongue as needed PLAVIX 75 MG TABS (CLOPIDOGREL BISULFATE) Take 1 tablet by mouth once a day LEVEMIR FLEXPEN 100 UNIT/ML SOLN (INSULIN DETEMIR) 55 units subcutaneously two times a day or as directed CRESTOR 20 MG  TABS (ROSUVASTATIN CALCIUM) 1 by mouth once daily BENICAR 40 MG  TABS (OLMESARTAN MEDOXOMIL) 1 by mouth once daily FREESTYLE LITE TEST  STRP (GLUCOSE BLOOD) use two times a day as directed FISH OIL 1200 MG  CAPS (OMEGA-3 FATTY ACIDS) once daily RED YEAST RICE 600 MG  CAPS (RED YEAST RICE EXTRACT) once daily NOVOLIN N 100 UNIT/ML SUSP (INSULIN ISOPHANE HUMAN) 18 Units with meals BD U/F SHORT PEN NEEDLE 31G X 8 MM MISC (INSULIN PEN NEEDLE) use as directed CHLORTHALIDONE 25 MG TABS (CHLORTHALIDONE) Take one-half  tablet by mouth daily CITALOPRAM HYDROBROMIDE 20 MG TABS (CITALOPRAM HYDROBROMIDE) take one tablet by mouth daily TRAZODONE HCL 50 MG TABS (TRAZODONE HCL) 1/2 - 1 by mouth at bedtime as needed insomnia METFORMIN HCL 1000 MG TABS (METFORMIN HCL) Take 1 tablet by mouth two times a day WARFARIN SODIUM 5 MG TABS (WARFARIN SODIUM) Take 1 tablet by mouth at bedtime OMEPRAZOLE 20 MG CPDR (OMEPRAZOLE) one by mouth daily METRONIDAZOLE 250 MG TABS (METRONIDAZOLE) One three times a day for 5 days PRO-FLORA  CONCENTRATE  CAPS (PROBIOTIC PRODUCT) 1 tab three times a day with meals    Laboratory Results   Blood Tests     PT: 17.3 s   (Normal Range: 10.6-13.4)  INR: 2.0   (Normal Range: 0.88-1.12   Therap INR: 2.0-3.5)

## 2010-10-28 NOTE — Assessment & Plan Note (Signed)
Summary: PT/CJR  Nurse Visit   Allergies: 1)  ! Vioxx 2)  Feldene (Piroxicam) Laboratory Results   Blood Tests      INR: 2.6   (Normal Range: 0.88-1.12   Therap INR: 2.0-3.5) Comments: Rita Ohara  September 14, 2010 10:42 AM     Orders Added: 1)  Est. Patient Level I [99211] 2)  Protime [[id number]]   ANTICOAGULATION RECORD PREVIOUS REGIMEN & LAB RESULTS Anticoagulation Diagnosis:  v58.83,v58.61,415.19 on  12/03/2009 Previous INR Goal Range:  2.5-3.5 on  12/10/2009 Previous INR:  2.2 on  06/10/2010 Previous Coumadin Dose(mg):  5mg  on  07/19/2010 Previous Regimen:  same on  05/12/2010 Previous Coagulation Comments:  Missed 1 day on  12/03/2009  NEW REGIMEN & LAB RESULTS Current INR: 2.6 Current Coumadin Dose(mg): 7.5mg . QD Regimen: 7.5mg  QD  Repeat testing in: 2 weeks  Anticoagulation Visit Questionnaire Coumadin dose missed/changed:  No Abnormal Bleeding Symptoms:  No  Any diet changes including alcohol intake, vegetables or greens since the last visit:  No Any illnesses or hospitalizations since the last visit:  No Any signs of clotting since the last visit (including chest discomfort, dizziness, shortness of breath, arm tingling, slurred speech, swelling or redness in leg):  No  MEDICATIONS CETIRIZINE HCL 10 MG TABS (CETIRIZINE HCL) Take 1 tablet by mouth once a day METOPROLOL TARTRATE 25 MG TABS (METOPROLOL TARTRATE) Take 1 tablet by mouth two times a day NITROQUICK 0.4 MG SUBL (NITROGLYCERIN) Place 1 tablet under tongue as needed LEVEMIR FLEXPEN 100 UNIT/ML SOLN (INSULIN DETEMIR) 60units subcutaneously two times a day or as directed CRESTOR 20 MG  TABS (ROSUVASTATIN CALCIUM) 1 by mouth once daily BENICAR 40 MG  TABS (OLMESARTAN MEDOXOMIL) 1 by mouth once daily FREESTYLE LITE TEST  STRP (GLUCOSE BLOOD) use two to three times a day as directed NOVOLIN N 100 UNIT/ML SUSP (INSULIN ISOPHANE HUMAN) 30 Units up to three times a day BD U/F SHORT PEN NEEDLE 31G X  8 MM MISC (INSULIN PEN NEEDLE) use as directed CHLORTHALIDONE 25 MG TABS (CHLORTHALIDONE) Take one-half  tablet by mouth daily CITALOPRAM HYDROBROMIDE 20 MG TABS (CITALOPRAM HYDROBROMIDE) take one tablet by mouth daily TRAZODONE HCL 50 MG TABS (TRAZODONE HCL) 1/2 - 1 by mouth at bedtime as needed insomnia WARFARIN SODIUM 5 MG TABS (WARFARIN SODIUM) Use as directed by Anticoagulation Clinic * UROCRIT Take 1 tablet by mouth three times a day with meals FISH OIL 1000 MG CAPS (OMEGA-3 FATTY ACIDS) 3 once daily ALIGN   CAPS (MISC INTESTINAL FLORA REGULAT) Take one capsule by mouth daily GLUCOPHAGE 1000 MG TABS (METFORMIN HCL) 1/2 tablet two times a day EFFIENT 10 MG TABS (PRASUGREL HCL) Take one tablet by mouth daily ASPIRIN 81 MG TBEC (ASPIRIN) Take one tablet by mouth daily. ON HOLD ANUSOL-HC 25 MG SUPP (HYDROCORTISONE ACETATE) Use 1 suppository at bedtime x 7 days then as needed MIRALAX  POWD (POLYETHYLENE GLYCOL 3350) Take 1 dose 17 gram in a glass of water every other day

## 2010-10-28 NOTE — Progress Notes (Signed)
Summary: antibiotic for gut & sinues/blowing blood on coumadin  Phone Note Call from Patient Call back at Work Phone [phone]   Caller: live Call For: Coye Dawood Summary of Call: Requests antibiotic for ongoing gut problem of pain, diarrhea, gas, that only gets better with antibiotic + he is blowing blood from his sinues & is on coumadin to get through this bout/weekend & he'll make appointment - may need workin.  [facility].  Allergic to Feldene. Initial call taken by: Rudy Jew, RN,  October 09, 2009 11:37 AM  Follow-up for Phone Call        metronidazole 250 mg three times a day for 5 days Follow-up by: Birdie Sons MD,  October 09, 2009 11:57 AM  Additional Follow-up for Phone Call Additional follow up Details #1::        Phone Call Completed Additional Follow-up by: Rudy Jew, RN,  October 09, 2009 1:17 PM    New/Updated Medications: METRONIDAZOLE 250 MG TABS (METRONIDAZOLE) One three times a day for 5 days Prescriptions: METRONIDAZOLE 250 MG TABS (METRONIDAZOLE) One three times a day for 5 days  #15 x 0   Entered by:   Rudy Jew, RN   Authorized by:   Birdie Sons MD   Signed by:   Rudy Jew, RN on 10/09/2009   Method used:   Electronically to        [facility] [phone][facility] (retail)       [address]       [place], [place]  [address]       Ph: [phone]       Fax: [phone]   RxID:   [id number]

## 2010-10-28 NOTE — Progress Notes (Signed)
Summary: Blood from nose  Phone Note Call from Patient   Caller: Patient Summary of Call: The pt called the office to let Dr Riley Kill know that he has developed bleeding from his nose since yesterday.  The pt does not have an active nose bleed but everytime he blows his nose he is seeing clots and fresh blood.  The pt said it feels like a sinus infection but he does not have a HA. The pt blew his nose about 20 times yesterday and today he is still having the same symptoms when blowing his nose.  I will discuss this pt with Dr Riley Kill.  Initial call taken by: Julieta Gutting, RN, BSN,  September 08, 2010 10:00 AM  Follow-up for Phone Call        Per Dr Riley Kill have the pt hold coumadin and effient today and come into the office this afternoon for appt. The pt's hematologist is Dr Cyndie Chime.  I spoke with the pt and he has already taken his medications today.  The pt will see Dr Riley Kill this afternoon in the office.  Follow-up by: Julieta Gutting, RN, BSN,  September 08, 2010 10:53 AM

## 2010-10-28 NOTE — Assessment & Plan Note (Signed)
Summary: F/U ON CHRONIC DIARRHEA (PT WILL ALSO NEED PT-LAB) // RS/PT R...   Vital Signs:  Patient profile:   60 year old male Weight:      269 pounds Temp:     98.1 degrees F Pulse rate:   64 / minute Pulse rhythm:   regular Resp:     12 per minute BP sitting:   108 / 64  (left arm) Cuff size:   regular  Vitals Entered By: Gladis Riffle, RN (January 05, 2010 1:53 PM) CC: FU chronic diarrhea, states has resolved--CBGs 150-200 in AM, 120 after meds at home Is Patient Diabetic? Yes Did you bring your meter with you today? No Comments taking urocrit to rid of kidney stones   Primary Care Provider:  Birdie Sons MD  CC:  FU chronic diarrhea, states has resolved--CBGs 150-200 in AM, and 120 after meds at home.  History of Present Illness: diarrhea--resolved---sxs persisted for several weeks after lowering dose of metformin  otherwise feels well  DM---says sugars are better (100-150)  HTN---tolerating meds  All other systems reviewed and were negative   Preventive Screening-Counseling & Management  Alcohol-Tobacco     Smoking Status: never  Current Medications (verified): 1)  Cetirizine Hcl 10 Mg Tabs (Cetirizine Hcl) .... Take 1 Tablet By Mouth Once A Day 2)  Metoprolol Tartrate 25 Mg Tabs (Metoprolol Tartrate) .... Take 1 Tablet By Mouth Two Times A Day 3)  Nitroquick 0.4 Mg Subl (Nitroglycerin) .... Place 1 Tablet Under Tongue As Needed 4)  Plavix 75 Mg Tabs (Clopidogrel Bisulfate) .... Take 1 Tablet By Mouth Once A Day 5)  Levemir Flexpen 100 Unit/ml Soln (Insulin Detemir) .... 60units Subcutaneously Two Times A Day or As Directed 6)  Crestor 20 Mg  Tabs (Rosuvastatin Calcium) .Marland Kitchen.. 1 By Mouth Once Daily 7)  Benicar 40 Mg  Tabs (Olmesartan Medoxomil) .Marland Kitchen.. 1 By Mouth Once Daily 8)  Freestyle Lite Test  Strp (Glucose Blood) .... Use Two Times A Day As Directed 9)  Fish Oil 1200 Mg  Caps (Omega-3 Fatty Acids) .... Once Daily 10)  Red Yeast Rice 600 Mg  Caps (Red Yeast Rice  Extract) .... Once Daily 11)  Novolin N 100 Unit/ml Susp (Insulin Isophane Human) .... 20 Units With Meals 12)  Bd U/f Short Pen Needle 31g X 8 Mm Misc (Insulin Pen Needle) .... Use As Directed 13)  Chlorthalidone 25 Mg Tabs (Chlorthalidone) .... Take One-Half  Tablet By Mouth Daily 14)  Citalopram Hydrobromide 20 Mg Tabs (Citalopram Hydrobromide) .... Take One Tablet By Mouth Daily 15)  Trazodone Hcl 50 Mg Tabs (Trazodone Hcl) .... 1/2 - 1 By Mouth At Bedtime As Needed Insomnia 16)  Metformin Hcl 1000 Mg Tabs (Metformin Hcl) .... Take 1/2  Tablet By Mouth Two Times A Day 17)  Warfarin Sodium 5 Mg Tabs (Warfarin Sodium) .... Take 1 Tablet By Mouth At Bedtime 18)  Omeprazole 20 Mg Cpdr (Omeprazole) .... One By Mouth Daily 19)  Urocrit .... Take 1 Tablet By Mouth Three Times A Day With Meals  Allergies: 1)  Feldene (Piroxicam)  Past History:  Past Medical History: Last updated: 02/27/2009 RENAL ARTERY STENOSIS (ICD-440.1) CORONARY ARTERY DISEASE (ICD-414.00) HYPERLIPIDEMIA (ICD-272.4) HYPERTENSION (ICD-401.9) OBESITY-MORBID (>100') (ICD-278.01) PSORIASIS (ICD-696.1) COLONIC POLYPS, ADENOMATOUS (ICD-211.3) ESOPHAGEAL STRICTURE (ICD-530.3) DUODENITIS, WITHOUT HEMORRHAGE (ICD-535.60) SLEEP APNEA, OBSTRUCTIVE (ICD-327.23) DIABETES MELLITUS, TYPE II, UNCONTROLLED (ICD-250.02) GERD (ICD-530.81) Pulmonary embolism, hx of Nephrolithiasis, hx of Depression  Past Surgical History: Last updated: 08/15/2008 Colonoscopy-03/06/2007 EGD-10/03/2002 RCA Stent x 2 Knee  arthroscopy Angioplasty - 2005 Knee Arthroscopy- 2007 PTCA/stent---2009  Family History: Last updated: 07/14/2009 Family History Diabetes 1st degree relative brother deceased-dm, htn, chf Family History of Coronary Artery Disease:  mother pulmonary embolus 2010 (factor V deficiency)  Social History: Last updated: 01/16/2009 Married Never Smoked Full Time Alcohol Use - no Drug Use - no  Risk Factors: Exercise: no  (06/08/2007)  Risk Factors: Smoking Status: never (01/05/2010)  Physical Exam  General:  Well-developed,well-nourished,in no acute distress; alert,appropriate and cooperative throughout examination Head:  normocephalic and atraumatic.   Eyes:  pupils equal and pupils round.   Ears:  R ear normal and L ear normal.   Neck:  No deformities, masses, or tenderness noted. Chest Wall:  No deformities, masses, tenderness or gynecomastia noted. Lungs:  Normal respiratory effort, chest expands symmetrically. Lungs are clear to auscultation, no crackles or wheezes. Heart:  normal rate and regular rhythm.   Abdomen:  soft and non-tender.   obese Msk:  No deformity or scoliosis noted of thoracic or lumbar spine.   Neurologic:  cranial nerves II-XII intact and gait normal.     Impression & Recommendations:  Problem # 1:  ABDOMINAL PAIN (ICD-789.00) diarrhea resolved...lower dose of metformin. 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  Problem # 3:  CORONARY ARTERY DISEASE (ICD-414.00) no symptoms.  Continue current medications. His updated medication list for this problem includes:    Metoprolol Tartrate 25 Mg Tabs (Metoprolol tartrate) .Marland Kitchen... Take 1 tablet by mouth two times a day    Nitroquick 0.4 Mg Subl (Nitroglycerin) .Marland Kitchen... Place 1 tablet under tongue as needed    Plavix 75 Mg Tabs (Clopidogrel bisulfate) .Marland Kitchen... Take 1 tablet by mouth once a day    Benicar 40 Mg Tabs (Olmesartan medoxomil) .Marland Kitchen... 1 by mouth once daily    Chlorthalidone 25 Mg Tabs (Chlorthalidone) .Marland Kitchen... Take one-half  tablet by mouth daily  Labs Reviewed: Chol: 151 (11/12/2009)   HDL: 35.70 (11/12/2009)   LDL: DEL (11/17/2008)   TG: 385.0 (11/12/2009)  Lipid Goals: Chol Goal: 200 (04/26/2007)   HDL Goal: 40 (04/26/2007)   LDL Goal: 100 (04/26/2007)   TG Goal: 150 (04/26/2007)  Problem # 4:  HYPERTENSION  (ICD-401.9) well-controlled.  Continue current medications. His updated medication list for this problem includes:    Metoprolol Tartrate 25 Mg Tabs (Metoprolol tartrate) .Marland Kitchen... Take 1 tablet by mouth two times a day    Benicar 40 Mg Tabs (Olmesartan medoxomil) .Marland Kitchen... 1 by mouth once daily    Chlorthalidone 25 Mg Tabs (Chlorthalidone) .Marland Kitchen... Take one-half  tablet by mouth daily  BP today: 108/64 Prior BP: 120/70 (12/07/2009)  Prior 10 Yr Risk Heart Disease: N/A (04/26/2007)  Labs Reviewed: K+: 4.8 (11/12/2009) Creat: : 1.0 (11/12/2009)   Chol: 151 (11/12/2009)   HDL: 35.70 (11/12/2009)   LDL: DEL (11/17/2008)   TG: 385.0 (11/12/2009)  Complete Medication List: 1)  Cetirizine Hcl 10 Mg Tabs (Cetirizine hcl) .... Take 1 tablet by mouth once a day 2)  Metoprolol Tartrate 25 Mg Tabs (Metoprolol tartrate) .... Take 1 tablet by mouth two times a day 3)  Nitroquick 0.4 Mg Subl (Nitroglycerin) .... Place 1 tablet under tongue as needed 4)  Plavix 75 Mg Tabs (Clopidogrel bisulfate) .... Take 1 tablet by mouth once a day 5)  Levemir Flexpen 100 Unit/ml Soln (Insulin detemir) .... 60units subcutaneously two times a day or as directed 6)  Crestor 20 Mg Tabs (  Rosuvastatin calcium) .Marland Kitchen.. 1 by mouth once daily 7)  Benicar 40 Mg Tabs (Olmesartan medoxomil) .Marland Kitchen.. 1 by mouth once daily 8)  Freestyle Lite Test Strp (Glucose blood) .... Use two times a day as directed 9)  Fish Oil 1200 Mg Caps (Omega-3 fatty acids) .... Once daily 10)  Red Yeast Rice 600 Mg Caps (Red yeast rice extract) .... Once daily 11)  Novolin N 100 Unit/ml Susp (Insulin isophane human) .... 20 units with meals 12)  Bd U/f Short Pen Needle 31g X 8 Mm Misc (Insulin pen needle) .... Use as directed 13)  Chlorthalidone 25 Mg Tabs (Chlorthalidone) .... Take one-half  tablet by mouth daily 14)  Citalopram Hydrobromide 20 Mg Tabs (Citalopram hydrobromide) .... Take one tablet by mouth daily 15)  Trazodone Hcl 50 Mg Tabs (Trazodone hcl) .... 1/2  - 1 by mouth at bedtime as needed insomnia 16)  Metformin Hcl 1000 Mg Tabs (Metformin hcl) .... Take 1/2  tablet by mouth two times a day 17)  Warfarin Sodium 5 Mg Tabs (Warfarin sodium) .... Take 1 tablet by mouth at bedtime 18)  Omeprazole 20 Mg Cpdr (Omeprazole) .... One by mouth daily 19)  Urocrit  .... Take 1 tablet by mouth three times a day with meals  Other Orders: Protime (40981XB) Fingerstick (14782)  Patient Instructions: 1)  warfarin 5 mg once daily except M<W<F take 7.5 mg  Laboratory Results   Blood Tests     PT: 15.8 s   (Normal Range: 10.6-13.4)  INR: 1.6   (Normal Range: 0.88-1.12   Therap INR: 2.0-3.5) Comments: Rita Ohara  January 05, 2010 1:49 PM       ANTICOAGULATION RECORD PREVIOUS REGIMEN & LAB RESULTS Anticoagulation Diagnosis:  v58.83,v58.61,415.19 on  12/03/2009 Previous INR Goal Range:  2.5-3.5 on  12/10/2009 Previous INR:  2.0 on  12/10/2009 Previous Coumadin Dose(mg):  7.5mg ,5mg  alt. on  12/10/2009 Previous Regimen:  same dose on  12/10/2009 Previous Coagulation Comments:  Missed 1 day on  12/03/2009  NEW REGIMEN & LAB RESULTS Current INR: 1.6 Regimen: same dose  (no change)   Anticoagulation Visit Questionnaire Coumadin dose missed/changed:  No Abnormal Bleeding Symptoms:  No  Any diet changes including alcohol intake, vegetables or greens since the last visit:  No Any illnesses or hospitalizations since the last visit:  No Any signs of clotting since the last visit (including chest discomfort, dizziness, shortness of breath, arm tingling, slurred speech, swelling or redness in leg):  No  MEDICATIONS CETIRIZINE HCL 10 MG TABS (CETIRIZINE HCL) Take 1 tablet by mouth once a day METOPROLOL TARTRATE 25 MG TABS (METOPROLOL TARTRATE) Take 1 tablet by mouth two times a day NITROQUICK 0.4 MG SUBL (NITROGLYCERIN) Place 1 tablet under tongue as needed PLAVIX 75 MG TABS (CLOPIDOGREL BISULFATE) Take 1 tablet by mouth once a day LEVEMIR  FLEXPEN 100 UNIT/ML SOLN (INSULIN DETEMIR) 60units subcutaneously two times a day or as directed CRESTOR 20 MG  TABS (ROSUVASTATIN CALCIUM) 1 by mouth once daily BENICAR 40 MG  TABS (OLMESARTAN MEDOXOMIL) 1 by mouth once daily FREESTYLE LITE TEST  STRP (GLUCOSE BLOOD) use two times a day as directed FISH OIL 1200 MG  CAPS (OMEGA-3 FATTY ACIDS) once daily RED YEAST RICE 600 MG  CAPS (RED YEAST RICE EXTRACT) once daily NOVOLIN N 100 UNIT/ML SUSP (INSULIN ISOPHANE HUMAN) 20 Units with meals BD U/F SHORT PEN NEEDLE 31G X 8 MM MISC (INSULIN PEN NEEDLE) use as directed CHLORTHALIDONE 25 MG TABS (CHLORTHALIDONE) Take one-half  tablet  by mouth daily CITALOPRAM HYDROBROMIDE 20 MG TABS (CITALOPRAM HYDROBROMIDE) take one tablet by mouth daily TRAZODONE HCL 50 MG TABS (TRAZODONE HCL) 1/2 - 1 by mouth at bedtime as needed insomnia METFORMIN HCL 1000 MG TABS (METFORMIN HCL) Take 1/2  tablet by mouth two times a day WARFARIN SODIUM 5 MG TABS (WARFARIN SODIUM) Take 1 tablet by mouth at bedtime OMEPRAZOLE 20 MG CPDR (OMEPRAZOLE) one by mouth daily * UROCRIT Take 1 tablet by mouth three times a day with meals

## 2010-10-28 NOTE — Assessment & Plan Note (Signed)
Summary: dod add on abn myoview, cp/hms   Visit Type:  ADD  ON Referring Provider:  Shawnie Pons, MD  Primary Provider:  Birdie Sons, MD   CC:  chest pain.  History of Present Illness: The patient has known coronary artery disease.  He had a recent intervention.  He is on Coumadin and he has factor V Leiden.  He saw Dr.Stuckey recently with chest discomfort. It was decided to start with a nuclear scan.  He had a scan today.  He had significant chest pain.  He did not have significant EKG change.  The nuclear images are definitely abnormal.  There is decreased activity in the inferior/inferolateral wall that reverses.  This is compatible with ischemia.  He had persistent pain after the study.  This finally resolved.  As he walked down the hall from the nuclear lab came to my office he again had some slight chest discomfort.  He is admitted for further treatment.  Allergies: 1)  ! Vioxx 2)  Feldene (Piroxicam)  Past History:  Past Medical History: RENAL ARTERY STENOSIS (ICD-440.1) CORONARY ARTERY DISEASE (ICD-414.00)  recurrent pain with abnormal Myoview July 13, 2010. HYPERLIPIDEMIA (ICD-272.4) HYPERTENSION (ICD-401.9) OBESITY-MORBID (>100') (ICD-278.01) PSORIASIS (ICD-696.1) ESOPHAGEAL STRICTURE (ICD-530.3) DUODENITIS, WITHOUT HEMORRHAGE (ICD-535.60) SLEEP APNEA, OBSTRUCTIVE (ICD-327.23) DIABETES MELLITUS, TYPE II, UNCONTROLLED (ICD-250.02) GERD (ICD-530.81) Pulmonary embolism, hx of Nephrolithiasis, hx of Depression Factor V Leiden deficiency  Family History: Reviewed history from 04/28/2010 and no changes required. Family History Diabetes 1st degree relative brother deceased-dm, htn, chf Family History of Coronary Artery Disease:  mother pulmonary embolus 2010 (factor V deficiency) No FH of Colon Cancer:  Social History: Reviewed history from 04/28/2010 and no changes required. [employer] Married 2 childern  Patient 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  Alcohol Use -  no Daily Caffeine Use: one daily  Illicit Drug Use - no  Review of Systems       Patient denies fever, chills, headache, sweats, rash, change in vision, change in hearing, cough, nausea vomiting, urinary symptoms.  All other systems are reviewed and are negative.  Physical Exam  General:  patient is overweight but stable.  He has an IV in his right arm from his nuclear study. Head:  head is atraumatic. Eyes:  no xanthelasma. Neck:  no jugulovenous attention. Chest Wall:  no chest wall pain. Lungs:  lungs are clear.  Respiratory effort is nonlabored. Heart:  cardiac exam reveals S1-S2.  No clicks or significant murmurs. Abdomen:  abdomen is soft. Msk:  no musculoskeletal deformities. Extremities:  no peripheral edema. Skin:  no skin rashes. Psych:  patient is oriented to person time and place.  Affect is normal.   Impression & Recommendations:  Problem # 1:  COUMADIN THERAPY (ICD-V58.61) The patient is on Coumadin.  I do not know his INR today yet.  It is being held.  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  Problem # 3:  RENAL ARTERY STENOSIS (ICD-440.1) I do not know if there is any reason to reassess this at the time of his next catheterization were not.  Problem # 4:  CORONARY ARTERY DISEASE (ICD-414.00)  His updated medication list for this problem includes:    Metoprolol Tartrate 25 Mg Tabs (Metoprolol tartrate) .Marland Kitchen... Take 1 tablet by mouth two times a day    Nitroquick 0.4 Mg Subl (Nitroglycerin) .Marland Kitchen... Place 1 tablet under tongue as needed    Plavix  75 Mg Tabs (Clopidogrel bisulfate) .Marland Kitchen... Take 1 tablet by mouth once a day    Warfarin Sodium 5 Mg Tabs (Warfarin sodium) ..... Use as directed by anticoagulation clinic I'm concerned about the patient's chest pain today.  It has resolved completely.  He did see Dr. Riley Kill recently who arranged for today's exercise test.  The test shows definite  ischemia.  A copy of the images will be sent to the hospital.  I will speak with Dr.Stuckey.  With the patient's abnormal test and symptoms repeat catheterization will be indicated.  It is of note that he did not have significant EKG changes with this pain and abnormal nuclear images.

## 2010-10-28 NOTE — Medication Information (Signed)
Summary: rov/ej  Anticoagulant Therapy  Managed by: Weston Brass, PharmD Referring MD: Shawnie Pons, MD PCP: Birdie Sons, MD  Supervising MD: Jens Som MD, Arlys John Indication 1: v58.83,v58.61,415.19 Indication 2: Factor V def Lab Used: [facility] [place] Site: [address] INR POC 2.9 INR RANGE 2.5-3.5  Dietary changes: no    Health status changes: no    Bleeding/hemorrhagic complications: yes       Details: had some nosebleeds and blood from rectum last week.   Recent/future hospitalizations: no    Any changes in medication regimen? no    Recent/future dental: no  Any missed doses?: yes     Details: took 5mg  instead of 7.5mg  on Friday and Saturday   Is patient compliant with meds? yes      Comments: Dose changed to 7.5mg  per Dr. Cato Mulligan earlier in December after INR was 2.0  Allergies: 1)  ! Vioxx 2)  Feldene (Piroxicam)  Anticoagulation Management History:      The patient is taking warfarin and comes in today for a routine follow up visit.  Positive risk factors for bleeding include presence of serious comorbidities.  Negative risk factors for bleeding include an age less than 46 years old and no history of CVA/TIA.  The bleeding index is 'intermediate risk'.  Positive CHADS2 values include History of HTN and History of Diabetes.  Negative CHADS2 values include Age > 67 years old and Prior Stroke/CVA/TIA.  His last INR was 2.6.  Anticoagulation responsible provider: Jens Som MD, Arlys John.  INR POC: 2.9.  Cuvette Lot#: [id number].  Exp: 10/2011.    Anticoagulation Management Assessment/Plan:      The patient's current anticoagulation dose is Warfarin sodium 5 mg tabs: Use as directed by Anticoagulation Clinic.  The target INR is 2.5-3.5.  The next INR is due 10/19/2010.  Anticoagulation instructions were given to patient.  Results were reviewed/authorized by Weston Brass, PharmD.  He was notified by Weston Brass PharmD.         Prior Anticoagulation  Instructions: Continue taking coumadin as scheduled with 1 tab (5 mg) on Tues, Thurs, and Saturday and 1 1/2 tablets (7.5 mg) on Sundays, Mondays, Wednesdays, and Fridays.  Current Anticoagulation Instructions: INR 2.9  Restart previous dose of 1 1/2 tablets every day except 1 tablet on Tuesday, Thursday and Saturday.  Recheck INR in

## 2010-10-28 NOTE — Letter (Signed)
Summary: Physician Referral Form  Physician Referral Form   Imported By: Marylou Mccoy 02/25/2010 16:30:22  _____________________________________________________________________  External Attachment:    Type:   Image     Comment:   External Document

## 2010-10-28 NOTE — Miscellaneous (Signed)
Summary: Appointment Canceled  Appointment status changed to canceled by [employer] on 04/28/2010 12:50 PM.  Cancellation Comments --------------------- echo/per check out at last office visit/lg  Appointment Information ----------------------- Appt Type:  CARDIOLOGY ANCILLARY VISIT      Date:  Monday, May 24, 2010      Time:  2:00 PM for 60 min   Urgency:  Routine   Made By:  Pearson Grippe  To Visit:  [id number]    Reason:  echo/per check out at last office visit/lg  Appt Comments ------------- -- 04/28/10 12:50: ([facility]) CANCELED -- echo/per check out at last office visit/lg -- 03/22/10 16:01: ([facility]) BOOKED -- Routine CARDIOLOGY ANCILLARY VISIT at 05/24/2010 2:00 PM for 60 min echo/per check out at last office visit/lg

## 2010-10-28 NOTE — Assessment & Plan Note (Signed)
Summary: PT/NJR  Nurse Visit   Allergies: 1)  Feldene (Piroxicam) Laboratory Results   Blood Tests   Date/Time Received: April 09, 2010 9:39 AM  Date/Time Reported: April 09, 2010 9:39 AM    INR: 2.4   (Normal Range: 0.88-1.12   Therap INR: 2.0-3.5) Comments: Wynona Canes, CMA  April 09, 2010 9:39 AM     Orders Added: 1)  Est. Patient Level I [99211] 2)  Protime [[id number]]  Laboratory Results   Blood Tests      INR: 2.4   (Normal Range: 0.88-1.12   Therap INR: 2.0-3.5) Comments: Wynona Canes, CMA  April 09, 2010 9:39 AM       ANTICOAGULATION RECORD PREVIOUS REGIMEN & LAB RESULTS Anticoagulation Diagnosis:  v58.83,v58.61,415.19 on  12/03/2009 Previous INR Goal Range:  2.5-3.5 on  12/10/2009 Previous INR:  2.0 on  03/16/2010 Previous Coumadin Dose(mg):  5mg  on m,w,f 7.5mg  other days on  02/16/2010 Previous Regimen:  same on  03/16/2010 Previous Coagulation Comments:  Missed 1 day on  12/03/2009  NEW REGIMEN & LAB RESULTS Current INR: 2.4 Regimen: same  (no change)       Repeat testing in: 2 weeks MEDICATIONS CETIRIZINE HCL 10 MG TABS (CETIRIZINE HCL) Take 1 tablet by mouth once a day METOPROLOL TARTRATE 25 MG TABS (METOPROLOL TARTRATE) Take 1 tablet by mouth two times a day NITROQUICK 0.4 MG SUBL (NITROGLYCERIN) Place 1 tablet under tongue as needed PLAVIX 75 MG TABS (CLOPIDOGREL BISULFATE) Take 1 tablet by mouth once a day LEVEMIR FLEXPEN 100 UNIT/ML SOLN (INSULIN DETEMIR) 60units subcutaneously two times a day or as directed CRESTOR 20 MG  TABS (ROSUVASTATIN CALCIUM) 1 by mouth once daily BENICAR 40 MG  TABS (OLMESARTAN MEDOXOMIL) 1 by mouth once daily FREESTYLE LITE TEST  STRP (GLUCOSE BLOOD) use two to three times a day as directed NOVOLIN N 100 UNIT/ML SUSP (INSULIN ISOPHANE HUMAN) 30 Units with meals and at bedtime BD U/F SHORT PEN NEEDLE 31G X 8 MM MISC (INSULIN PEN NEEDLE) use as directed CHLORTHALIDONE 25 MG TABS (CHLORTHALIDONE) Take one-half   tablet by mouth daily CITALOPRAM HYDROBROMIDE 20 MG TABS (CITALOPRAM HYDROBROMIDE) take one tablet by mouth daily TRAZODONE HCL 50 MG TABS (TRAZODONE HCL) 1/2 - 1 by mouth at bedtime as needed insomnia WARFARIN SODIUM 5 MG TABS (WARFARIN SODIUM) Use as directed by Anticoagulation Clinic OMEPRAZOLE 20 MG CPDR (OMEPRAZOLE) one by mouth daily * UROCRIT Take 1 tablet by mouth three times a day with meals FISH OIL 1000 MG CAPS (OMEGA-3 FATTY ACIDS) 3 once daily FLORA-Q  CAPS (PROBIOTIC PRODUCT) 2 by mouth two times a day GLUCOPHAGE 1000 MG TABS (METFORMIN HCL) 1/2 tablet two times a day   Anticoagulation Visit Questionnaire      Coumadin dose missed/changed:  No      Abnormal Bleeding Symptoms:  No   Any diet changes including alcohol intake, vegetables or greens since the last visit:  No Any illnesses or hospitalizations since the last visit:  No Any signs of clotting since the last visit (including chest discomfort, dizziness, shortness of breath, arm tingling, slurred speech, swelling or redness in leg):  No

## 2010-10-28 NOTE — Progress Notes (Signed)
Summary: lab results  Phone Note Call from Patient   Caller: Patient Call For: Birdie Sons MD Reason for Call: Acute Illness, Lab or Test Results Complaint: Nausea/Vomiting/Diarrhea, Abdominal Pain Action Taken: Provider Notified Summary of Call: Pt is calling for lab results and his BS is over 300, and diarrhea is back with abdominal pain. [phone] Initial call taken by: Lynann Beaver CMA,  May 24, 2010 2:12 PM  Follow-up for Phone Call        stool sample normal. continue Levemir at current dose. Add regular insulin 10 units with each meal.  Start probiotic over-the-counter can use a line per package instructions. Refer to GI. Follow-up by: Birdie Sons MD,  May 25, 2010 8:08 AM  Additional Follow-up for Phone Call Additional follow up Details #1::        Notified pt. Additional Follow-up by: Lynann Beaver CMA,  May 25, 2010 8:39 AM    New/Updated Medications: HUMULIN R U-500 (CONCENTRATED) 500 UNIT/ML SOLN (INSULIN REGULAR HUMAN) 10 units with each meal.

## 2010-10-28 NOTE — Assessment & Plan Note (Signed)
Summary: per check out/sf   Visit Type:  Follow-up Referring [name]:  Shawnie Pons, MD  Primary Lamya Lausch:  Birdie Sons, MD   CC:  No complaints.  History of Present Illness: Doing well.  Saw Dr. Ezzard Standing and also Dr. Cato Mulligan.  Hemorrhoidal bleeding has stopped.  Nasal bleeding has stopped as well.  Now utilizing distlled water for CPAP.  Last INR was 2.6 .  Hemoglobin A1c was 8.5 recently and Dr. Cato Mulligan has discussed with him need to be work on this.   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 Take 1 Tablet By Mouth Once A Day 2)  Metoprolol Tartrate 25 Mg Tabs (Metoprolol Tartrate) .... Take 1 Tablet By Mouth Two Times A Day 3)  Nitroquick 0.4 Mg Subl (Nitroglycerin) .... Place 1 Tablet Under Tongue As Needed 4)  Levemir Flexpen 100 Unit/ml Soln (Insulin Detemir) .... 60units Subcutaneously Two Times A Day or As Directed 5)  Crestor 20 Mg  Tabs (Rosuvastatin Calcium) .Marland Kitchen.. 1 By Mouth Once Daily 6)  Benicar 40 Mg  Tabs (Olmesartan Medoxomil) .Marland Kitchen.. 1 By Mouth Once Daily 7)  Freestyle Lite Test  Strp (Glucose Blood) .... Use Two To Three Times A  Day As Directed 8)  Novolin N 100 Unit/ml Susp (Insulin Isophane Human) .... 30 Units Up To Three Times A Day 9)  Bd U/f Short Pen Needle 31g X 8 Mm Misc (Insulin Pen Needle) .... Use As Directed 10)  Chlorthalidone 25 Mg Tabs (Chlorthalidone) .... Take One-Half  Tablet By Mouth Daily 11)  Citalopram Hydrobromide 20 Mg Tabs (Citalopram Hydrobromide) .... Take One Tablet By Mouth Daily 12)  Trazodone Hcl 50 Mg Tabs (Trazodone Hcl) .... 1/2 - 1 By Mouth At Bedtime As Needed Insomnia 13)  Warfarin Sodium 5 Mg Tabs (Warfarin Sodium) .... Use As Directed By Anticoagulation Clinic 14)  Urocrit .... Take 1 Tablet By Mouth Three Times A Day With Meals 15)  Fish Oil 1000 Mg Caps (Omega-3 Fatty Acids) .... 3 Once Daily 16)  Align   Caps (Misc Intestinal Flora Regulat) .... Take One Capsule By Mouth Daily 17)  Glucophage 1000 Mg Tabs (Metformin Hcl) .... 1/2 Tablet Two Times A Day 18)  Effient 10 Mg Tabs (Prasugrel Hcl) .... Take One Tablet By Mouth Daily 19)  Aspirin 81 Mg Tbec (Aspirin) .... Take One Tablet By Mouth Daily. On Hold 20)  Anusol-Hc 25 Mg Supp (Hydrocortisone Acetate) .... Use 1 Suppository At Bedtime X 7 Days Then As Needed 21)  Miralax  Powd (Polyethylene Glycol 3350) .... Take 1 Dose 17 Gram in A Glass of Water Every Other Day  Allergies: 1)  ! Vioxx 2)  Feldene (Piroxicam)  Vital Signs:  Patient  profile:   60 year old male Height:      71 inches Weight:      266 pounds BMI:     37.23 Pulse rate:   84 / minute Pulse rhythm:   regular Resp:     18 per minute BP sitting:   108 / 70  (left arm) Cuff size:   large  Vitals Entered By: Vikki Ports (September 22, 2010 1:47 PM)  Physical Exam  General:  Well developed, well nourished, in no acute distress. Head:  normocephalic and atraumatic Eyes:  PERRLA/EOM intact; conjunctiva and lids normal. Lungs:  Clear bilaterally to auscultation and percussion. Heart:  PMI non displaced.  Normal S1 and S2.  No murmur Pulses:  pulses  normal in all 4 extremities Extremities:  No clubbing or cyanosis. Neurologic:  Alert and oriented x 3.   Impression & Recommendations:  Problem # 1:  HEMORRHOIDS, WITH BLEEDING (ICD-455.8) Has resolved.  We discussed anticoagulation treatment.  Now off of ASA, but on Effient, so platelet supression should be sufficient without ASA.  Given tendency toward bleeding, and plavix non responsiveness, and recent overlapping DES placement, will lean toward combo of Effient, and warfarin.  Precautions reminded.  Problem # 2:  CORONARY ARTERY DISEASE (ICD-414.00) no recurrent symptoms since placement of stents.  Reminded regarding need for better DM control to prevent future ACS events. His updated medication list for this problem includes:    Metoprolol Tartrate 25 Mg Tabs (Metoprolol tartrate) .Marland Kitchen... Take 1 tablet by mouth two times a day    Nitroquick 0.4 Mg Subl (Nitroglycerin) .Marland Kitchen... Place 1 tablet under tongue as needed    Warfarin Sodium 5 Mg Tabs (Warfarin sodium) ..... Use as directed by anticoagulation clinic    Effient 10 Mg Tabs (Prasugrel hcl) .Marland Kitchen... Take one tablet by mouth daily    Aspirin 81 Mg Tbec (Aspirin) .Marland Kitchen... Take one tablet by mouth daily. on hold  Problem # 3:  PULMONARY EMBOLISM (ICD-415.19) History of PE with Factor V Leiden---therefore lifelong warfarin per Dr. Cyndie Chime.   His updated medication list for this problem includes:    Warfarin Sodium 5 Mg Tabs (Warfarin sodium) ..... Use as directed by anticoagulation clinic    Effient 10 Mg Tabs (Prasugrel hcl) .Marland Kitchen... Take one tablet by mouth daily    Aspirin 81 Mg Tbec (Aspirin) .Marland Kitchen... Take one tablet by mouth daily. on hold  Patient Instructions: 1)  Your physician recommends that you schedule a follow-up appointment in: 2 months 2)  Your physician recommends that you continue on your current medications as directed. Please refer to the Current Medication list given to you today.

## 2010-10-28 NOTE — Assessment & Plan Note (Signed)
Summary: colon pain/dm PT [facility]   Vital Signs:  Patient profile:   60 year old male Pulse rate:   60 / minute Pulse rhythm:   regular Resp:     12 per minute BP sitting:   98 / 60  (left arm) Cuff size:   regular  Vitals Entered By: Gladis Riffle, RN (May 20, 2010 8:59 AM) CC: c/o abdominal pain and diarrhea, also bleeding hemorrhoids Is Patient Diabetic? Yes Did you bring your meter with you today? No Comments CBGs 72-375 this week, a80 this AM at home   Primary Care Provider:  Birdie Sons, MD   CC:  c/o abdominal pain and diarrhea and also bleeding hemorrhoids.  History of Present Illness: recurrent abdominal complaints monday---diarrhea, bloating, belching stopped metformin and sxs improving had one bout of diarrhea this a.m. 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 Take 1 Tablet By Mouth Once A Day 2)  Metoprolol Tartrate 25 Mg Tabs (Metoprolol Tartrate) .... Take 1 Tablet By  Mouth Two Times A Day 3)  Nitroquick 0.4 Mg Subl (Nitroglycerin) .... Place 1 Tablet Under Tongue As Needed 4)  Plavix 75 Mg Tabs (Clopidogrel Bisulfate) .... Take 1 Tablet By Mouth Once A Day 5)  Levemir Flexpen 100 Unit/ml Soln (Insulin Detemir) .... 60units Subcutaneously Two Times A Day or As Directed 6)  Crestor 20 Mg  Tabs (Rosuvastatin Calcium) .Marland Kitchen.. 1 By Mouth Once Daily 7)  Benicar 40 Mg  Tabs (Olmesartan Medoxomil) .Marland Kitchen.. 1 By Mouth Once Daily 8)  Freestyle Lite Test  Strp (Glucose Blood) .... Use Two To Three Times A Day As Directed 9)  Novolin N 100 Unit/ml Susp (Insulin Isophane Human) .... 30 Units With Meals and At Bedtime 10)  Bd U/f Short Pen Needle 31g X 8 Mm Misc (Insulin Pen Needle) .... Use As Directed 11)  Chlorthalidone 25 Mg Tabs (Chlorthalidone) .... Take One-Half  Tablet By Mouth Daily 12)  Citalopram Hydrobromide 20 Mg Tabs (Citalopram Hydrobromide) .... Take One Tablet By Mouth Daily 13)  Trazodone Hcl 50 Mg Tabs (Trazodone Hcl) .... 1/2 - 1 By Mouth At Bedtime As Needed Insomnia 14)  Warfarin Sodium 5 Mg Tabs (Warfarin Sodium) .... Use As Directed By Anticoagulation Clinic 15)  Omeprazole 20 Mg Cpdr (Omeprazole) .... One By Mouth Daily 16)  Urocrit .... Take 1 Tablet By Mouth Three Times A Day With Meals 17)  Fish Oil 1000 Mg Caps (Omega-3 Fatty Acids) .... 3 Once Daily 18)  Flora-Q  Caps (Probiotic Product) .... 2 By Mouth Two Times A  Day 19)  Glucophage 1000 Mg Tabs (Metformin Hcl) .... 1/2 Tablet Two Times A Day--Hold 20)  Anusol-Hc 25 Mg Supp (Hydrocortisone Acetate) .Marland Kitchen.. 1 Per Rectum Nightly For 1 Week Then As Needed For Bleeding Hemorrhoids  Allergies: 1)  Feldene (Piroxicam)  Past History:  Past Medical History: Last updated: 04/28/2010 RENAL ARTERY STENOSIS (ICD-440.1) CORONARY ARTERY DISEASE (ICD-414.00) HYPERLIPIDEMIA (ICD-272.4) HYPERTENSION (ICD-401.9) OBESITY-MORBID (>100') (ICD-278.01) PSORIASIS (ICD-696.1) ESOPHAGEAL STRICTURE  (ICD-530.3) DUODENITIS, WITHOUT HEMORRHAGE (ICD-535.60) SLEEP APNEA, OBSTRUCTIVE (ICD-327.23) DIABETES MELLITUS, TYPE II, UNCONTROLLED (ICD-250.02) GERD (ICD-530.81) Pulmonary embolism, hx of Nephrolithiasis, hx of Depression Factor V Leiden deficiency  Past Surgical History: Last updated: 04/28/2010 Colonoscopy-03/06/2007 EGD-10/03/2002 RCA Stent x 2 Knee arthroscopy Angioplasty - 2005 & 2011 Knee Arthroscopy- 2007 PTCA/stent---2009  Family History: Last updated: 04/28/2010 Family History Diabetes 1st degree relative brother deceased-dm, htn, chf Family History of Coronary Artery Disease:  mother pulmonary embolus 2010 (factor V deficiency) No FH of Colon Cancer:  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  Alcohol Use - no Daily Caffeine Use: one daily  Illicit Drug Use - no  Risk Factors: Exercise: no (06/08/2007)  Risk Factors: Smoking Status: never (05/20/2010)  Physical Exam  General:  alert and well-developed.   Head:  normocephalic and atraumatic.   Eyes:  pupils equal and pupils round.   Ears:  R ear normal and L ear normal.   Neck:  No deformities, masses, or tenderness noted. Chest Wall:  No deformities, masses, tenderness or gynecomastia noted. Lungs:  normal respiratory effort and no intercostal retractions.   Abdomen:  Bowel sounds positive,abdomen soft and non-tender without masses, organomegaly or hernias noted. Skin:  turgor normal and color normal.     Impression & Recommendations:  Problem # 1:  ABDOMINAL PAIN (ICD-789.00)  recurrent sxs could be metformin related---stay off metformin check stool studies  Orders: T-Culture, Stool ([id number]) T-Culture, C-Diff Toxin A/B ([id number]) T-Stool for O&P ([id number]) T-Stool Giardia / Crypto- EIA (62703)  Problem # 2:  DIABETES MELLITUS, TYPE II, UNCONTROLLED (ICD-250.02) off metformin---warned that his cbgs  will increase---needs to be extra cautious with diet/exercise His updated medication list for this problem includes:    Levemir Flexpen 100 Unit/ml Soln (Insulin detemir) .Marland KitchenMarland KitchenMarland KitchenMarland Kitchen 60units subcutaneously two times a day or as directed    Benicar 40 Mg Tabs (Olmesartan medoxomil) .Marland Kitchen... 1 by mouth once daily    Novolin N 100 Unit/ml Susp (Insulin isophane human) .Marland KitchenMarland KitchenMarland KitchenMarland Kitchen 30 units with meals and at bedtime    Glucophage 1000 Mg Tabs (Metformin hcl) .Marland Kitchen... 1/2 tablet two times a day--hold  Complete Medication List: 1)  Cetirizine Hcl 10 Mg Tabs (Cetirizine hcl) .... Take 1 tablet by mouth once a day 2)  Metoprolol Tartrate 25 Mg Tabs (Metoprolol tartrate) .... Take 1 tablet by mouth two times a day 3)  Nitroquick 0.4 Mg Subl (Nitroglycerin) .... Place 1 tablet under tongue as needed 4)  Plavix 75 Mg Tabs (Clopidogrel bisulfate) .... Take 1 tablet by mouth once a day 5)  Levemir Flexpen 100 Unit/ml Soln (Insulin detemir) .... 60units subcutaneously two times a day or as directed 6)  Crestor 20 Mg Tabs (Rosuvastatin calcium) .Marland Kitchen.. 1 by mouth once daily 7)  Benicar 40 Mg Tabs (Olmesartan medoxomil) .Marland Kitchen.. 1 by mouth once daily 8)  Freestyle Lite Test Strp (Glucose blood) .... Use two to three times a day as directed 9)  Novolin N 100 Unit/ml Susp (Insulin isophane human) .... 30 units with meals and  at bedtime 10)  Bd U/f Short Pen Needle 31g X 8 Mm Misc (Insulin pen needle) .... Use as directed 11)  Chlorthalidone 25 Mg Tabs (Chlorthalidone) .... Take one-half  tablet by mouth daily 12)  Citalopram Hydrobromide 20 Mg Tabs (Citalopram hydrobromide) .... Take one tablet by mouth daily 13)  Trazodone Hcl 50 Mg Tabs (Trazodone hcl) .... 1/2 - 1 by mouth at bedtime as needed insomnia 14)  Warfarin Sodium 5 Mg Tabs (Warfarin sodium) .... Use as directed by anticoagulation clinic 15)  Omeprazole 20 Mg Cpdr (Omeprazole) .... One by mouth daily 16)  Urocrit  .... Take 1 tablet by mouth three times a day with meals 17)   Fish Oil 1000 Mg Caps (Omega-3 fatty acids) .... 3 once daily 18)  Flora-q Caps (Probiotic product) .... 2 by mouth two times a day 19)  Glucophage 1000 Mg Tabs (Metformin hcl) .... 1/2 tablet two times a day--hold 20)  Anusol-hc 25 Mg Supp (Hydrocortisone acetate) .Marland Kitchen.. 1 per rectum nightly for 1 week then as needed for bleeding hemorrhoids

## 2010-10-28 NOTE — Assessment & Plan Note (Signed)
Summary: 1 MTH F/U (STOMACH CONCERNS) / THEN GO FOR PT // RS   Vital Signs:  Patient profile:   60 year old male Weight:      268 pounds Temp:     98.1 degrees F oral Pulse rate:   70 / minute Pulse rhythm:   regular Resp:     12 per minute BP sitting:   108 / 84  (left arm) Cuff size:   regular  Vitals Entered By: Gladis Riffle, RN (December 03, 2009 11:16 AM) CC: 1 month rov; stomach feeling better with decreased metformin Is Patient Diabetic? Yes Did you bring your meter with you today? No Comments CBGs 200 in AM, 160-230 at lunch, 200-225 at 5-6 PM at home--states "bottomed out" to 90 on 3/7 with total left side pain and went to ER, was not seen there so went home   Primary Care Provider:  Birdie Sons MD  CC:  1 month rov; stomach feeling better with decreased metformin.  History of Present Illness: GI sxs---much better on lower dose of metformin  Preventive Screening-Counseling & Management  Alcohol-Tobacco     Smoking Status: never  Current Medications (verified): 1)  Cetirizine Hcl 10 Mg Tabs (Cetirizine Hcl) .... Take 1 Tablet By Mouth Once A Day 2)  Metoprolol Tartrate 25 Mg Tabs (Metoprolol Tartrate) .... Take 1 Tablet By Mouth Two Times A Day 3)  Nitroquick 0.4 Mg Subl (Nitroglycerin) .... Place 1 Tablet Under Tongue As Needed 4)  Plavix 75 Mg Tabs (Clopidogrel Bisulfate) .... Take 1 Tablet By Mouth Once A Day 5)  Levemir Flexpen 100 Unit/ml Soln (Insulin Detemir) .... 55 Units Subcutaneously Two Times A Day or As Directed 6)  Crestor 20 Mg  Tabs (Rosuvastatin Calcium) .Marland Kitchen.. 1 By Mouth Once Daily 7)  Benicar 40 Mg  Tabs (Olmesartan Medoxomil) .Marland Kitchen.. 1 By Mouth Once Daily 8)  Freestyle Lite Test  Strp (Glucose Blood) .... Use Two Times A Day As Directed 9)  Fish Oil 1200 Mg  Caps (Omega-3 Fatty Acids) .... Once Daily 10)  Red Yeast Rice 600 Mg  Caps (Red Yeast Rice Extract) .... Once Daily 11)  Novolin N 100 Unit/ml Susp (Insulin Isophane Human) .... 20 Units With  Meals 12)  Bd U/f Short Pen Needle 31g X 8 Mm Misc (Insulin Pen Needle) .... Use As Directed 13)  Chlorthalidone 25 Mg Tabs (Chlorthalidone) .... Take One-Half  Tablet By Mouth Daily 14)  Citalopram Hydrobromide 20 Mg Tabs (Citalopram Hydrobromide) .... Take One Tablet By Mouth Daily 15)  Trazodone Hcl 50 Mg Tabs (Trazodone Hcl) .... 1/2 - 1 By Mouth At Bedtime As Needed Insomnia 16)  Metformin Hcl 1000 Mg Tabs (Metformin Hcl) .... Take 1/2  Tablet By Mouth Two Times A Day 17)  Warfarin Sodium 5 Mg Tabs (Warfarin Sodium) .... Take 1 Tablet By Mouth At Bedtime 18)  Omeprazole 20 Mg Cpdr (Omeprazole) .... One By Mouth Daily  Allergies: 1)  Feldene (Piroxicam)   Impression & Recommendations:  Problem # 1:  ABDOMINAL PAIN (ICD-789.00) resolved  Problem # 2:  DIABETES MELLITUS, TYPE II, UNCONTROLLED (ICD-250.02) worse off metformin His updated medication list for this problem includes:    Levemir Flexpen 100 Unit/ml Soln (Insulin detemir) .Marland KitchenMarland KitchenMarland KitchenMarland Kitchen 60units subcutaneously two times a day or as directed    Benicar 40 Mg Tabs (Olmesartan medoxomil) .Marland Kitchen... 1 by mouth once daily    Novolin N 100 Unit/ml Susp (Insulin isophane human) .Marland Kitchen... 20 units with meals    Metformin Hcl  1000 Mg Tabs (Metformin hcl) .Marland Kitchen... Take 1/2  tablet by mouth two times a day  Complete Medication List: 1)  Cetirizine Hcl 10 Mg Tabs (Cetirizine hcl) .... Take 1 tablet by mouth once a day 2)  Metoprolol Tartrate 25 Mg Tabs (Metoprolol tartrate) .... Take 1 tablet by mouth two times a day 3)  Nitroquick 0.4 Mg Subl (Nitroglycerin) .... Place 1 tablet under tongue as needed 4)  Plavix 75 Mg Tabs (Clopidogrel bisulfate) .... Take 1 tablet by mouth once a day 5)  Levemir Flexpen 100 Unit/ml Soln (Insulin detemir) .... 60units subcutaneously two times a day or as directed 6)  Crestor 20 Mg Tabs (Rosuvastatin calcium) .Marland Kitchen.. 1 by mouth once daily 7)  Benicar 40 Mg Tabs (Olmesartan medoxomil) .Marland Kitchen.. 1 by mouth once daily 8)  Freestyle  Lite Test Strp (Glucose blood) .... Use two times a day as directed 9)  Fish Oil 1200 Mg Caps (Omega-3 fatty acids) .... Once daily 10)  Red Yeast Rice 600 Mg Caps (Red yeast rice extract) .... Once daily 11)  Novolin N 100 Unit/ml Susp (Insulin isophane human) .... 20 units with meals 12)  Bd U/f Short Pen Needle 31g X 8 Mm Misc (Insulin pen needle) .... Use as directed 13)  Chlorthalidone 25 Mg Tabs (Chlorthalidone) .... Take one-half  tablet by mouth daily 14)  Citalopram Hydrobromide 20 Mg Tabs (Citalopram hydrobromide) .... Take one tablet by mouth daily 15)  Trazodone Hcl 50 Mg Tabs (Trazodone hcl) .... 1/2 - 1 by mouth at bedtime as needed insomnia 16)  Metformin Hcl 1000 Mg Tabs (Metformin hcl) .... Take 1/2  tablet by mouth two times a day 17)  Warfarin Sodium 5 Mg Tabs (Warfarin sodium) .... Take 1 tablet by mouth at bedtime 18)  Omeprazole 20 Mg Cpdr (Omeprazole) .... One by mouth daily  Other Orders: Protime ([id number])  Patient Instructions: 1)  Please schedule a follow-up appointment in 2 months. 2)  labs one week prior to visit 3)  lipids---272.4 4)  lfts-995.2 5)  bmet-995.2 6)  A1C-250.02 7)     8)  coordinate labs with protime  Laboratory Results   Blood Tests   Date/Time Recieved: December 03, 2009 12:03 PM  Date/Time Reported: December 03, 2009 12:03 PM   PT: 13.5 s   (Normal Range: 10.6-13.4)  INR: 1.2   (Normal Range: 0.88-1.12   Therap INR: 2.0-3.5) Comments: Wynona Canes, CMA  December 03, 2009 12:03 PM       ANTICOAGULATION RECORD PREVIOUS REGIMEN & LAB RESULTS  Previous INR Goal Range:  2.5-3.5 on  11/20/2009 Previous INR:  2.1 on  11/20/2009 Previous Coumadin Dose(mg):  7.5mg ,5mg ,5mg  alt. on  11/20/2009 Previous Regimen:  same on  11/20/2009  NEW REGIMEN & LAB RESULTS Anticoag. Dx: v58.83,v58.61,415.19 Current INR: 1.2 Regimen: 7.5mg ,5mg  alt. Coagulation Comments: Missed 1 day      Repeat testing in: 1 week MEDICATIONS CETIRIZINE HCL 10 MG  TABS (CETIRIZINE HCL) Take 1 tablet by mouth once a day METOPROLOL TARTRATE 25 MG TABS (METOPROLOL TARTRATE) Take 1 tablet by mouth two times a day NITROQUICK 0.4 MG SUBL (NITROGLYCERIN) Place 1 tablet under tongue as needed PLAVIX 75 MG TABS (CLOPIDOGREL BISULFATE) Take 1 tablet by mouth once a day LEVEMIR FLEXPEN 100 UNIT/ML SOLN (INSULIN DETEMIR) 60units subcutaneously two times a day or as directed CRESTOR 20 MG  TABS (ROSUVASTATIN CALCIUM) 1 by mouth once daily BENICAR 40 MG  TABS (OLMESARTAN MEDOXOMIL) 1 by mouth once daily FREESTYLE LITE TEST  STRP (GLUCOSE BLOOD) use two times a day as directed FISH OIL 1200 MG  CAPS (OMEGA-3 FATTY ACIDS) once daily RED YEAST RICE 600 MG  CAPS (RED YEAST RICE EXTRACT) once daily NOVOLIN N 100 UNIT/ML SUSP (INSULIN ISOPHANE HUMAN) 20 Units with meals BD U/F SHORT PEN NEEDLE 31G X 8 MM MISC (INSULIN PEN NEEDLE) use as directed CHLORTHALIDONE 25 MG TABS (CHLORTHALIDONE) Take one-half  tablet by mouth daily CITALOPRAM HYDROBROMIDE 20 MG TABS (CITALOPRAM HYDROBROMIDE) take one tablet by mouth daily TRAZODONE HCL 50 MG TABS (TRAZODONE HCL) 1/2 - 1 by mouth at bedtime as needed insomnia METFORMIN HCL 1000 MG TABS (METFORMIN HCL) Take 1/2  tablet by mouth two times a day WARFARIN SODIUM 5 MG TABS (WARFARIN SODIUM) Take 1 tablet by mouth at bedtime OMEPRAZOLE 20 MG CPDR (OMEPRAZOLE) one by mouth daily   Anticoagulation Visit Questionnaire      Coumadin dose missed/changed:  Yes      Coumadin Dose Comments:  one or more missed dose(s)      Abnormal Bleeding Symptoms:  No   Any diet changes including alcohol intake, vegetables or greens since the last visit:  No Any illnesses or hospitalizations since the last visit:  No Any signs of clotting since the last visit (including chest discomfort, dizziness, shortness of breath, arm tingling, slurred speech, swelling or redness in leg):  No

## 2010-10-28 NOTE — Progress Notes (Signed)
Summary: Question about stress test and cardiac rehab  Phone Note Call from Patient Call back at Home Phone [phone] Call back at Work Phone [phone]   Caller: Patient Summary of Call: Pt calling regarding cardiac rehab and want to know if Dr.Reeder Brisby still want him to have stress test on rhe 04/2910 Initial call taken by: Judie Grieve,  April 27, 2010 11:10 AM  Follow-up for Phone Call        LEFT MESSAGE  FOR REHAB TO CALL BACK TO SEE IF CAN FAX ORDER FOR PT TO CONT MAINTENANCE PROGRAM STARTS ON MON [id number] PT HAS APPT WITH DR Riley Kill ON 04/28/10 AT 10:45  REHAB RETURNED CALL WILL FAX ORDER TO OFFICE TO BE SIGNED BY DR Riley Kill. PT DOESNT NEED TO KEEP  STRESS AND ECHO WITH YOU ON 05/24/10? Follow-up by: Scherrie Bateman, LPN,  April 27, 2010 12:13 PM  Additional Follow-up for Phone Call Additional follow up Details #1::        Echo and GXT cancelled per Dr Riley Kill.  Form completed for cardiac rehab. Additional Follow-up by: Julieta Gutting, RN, BSN,  April 28, 2010 12:57 PM

## 2010-10-28 NOTE — Assessment & Plan Note (Signed)
Summary: PT // RS  Nurse Visit   Allergies: 1)  Feldene (Piroxicam) Laboratory Results   Blood Tests   Date/Time Received: June 10, 2010 12:45 PM  Date/Time Reported: June 10, 2010 12:45 PM    INR: 2.2   (Normal Range: 0.88-1.12   Therap INR: 2.0-3.5) Comments: Wynona Canes, CMA  June 10, 2010 12:45 PM     Orders Added: 1)  Est. Patient Level I [99211] 2)  Protime [[id number]]  Laboratory Results   Blood Tests      INR: 2.2   (Normal Range: 0.88-1.12   Therap INR: 2.0-3.5) Comments: Wynona Canes, CMA  June 10, 2010 12:45 PM       ANTICOAGULATION RECORD PREVIOUS REGIMEN & LAB RESULTS Anticoagulation Diagnosis:  v58.83,v58.61,415.19 on  12/03/2009 Previous INR Goal Range:  2.5-3.5 on  12/10/2009 Previous INR:  2.6 on  05/12/2010 Previous Coumadin Dose(mg):  5mg  on m,w,f 7.5mg  other days on  02/16/2010 Previous Regimen:  same on  05/12/2010 Previous Coagulation Comments:  Missed 1 day on  12/03/2009  NEW REGIMEN & LAB RESULTS Current INR: 2.2 Current Coumadin Dose(mg): 5mg  qd Regimen: same  (no change)       Repeat testing in: 4 weeks MEDICATIONS CETIRIZINE HCL 10 MG TABS (CETIRIZINE HCL) Take 1 tablet by mouth once a day METOPROLOL TARTRATE 25 MG TABS (METOPROLOL TARTRATE) Take 1 tablet by mouth two times a day NITROQUICK 0.4 MG SUBL (NITROGLYCERIN) Place 1 tablet under tongue as needed PLAVIX 75 MG TABS (CLOPIDOGREL BISULFATE) Take 1 tablet by mouth once a day LEVEMIR FLEXPEN 100 UNIT/ML SOLN (INSULIN DETEMIR) 60units subcutaneously two times a day or as directed CRESTOR 20 MG  TABS (ROSUVASTATIN CALCIUM) 1 by mouth once daily BENICAR 40 MG  TABS (OLMESARTAN MEDOXOMIL) 1 by mouth once daily FREESTYLE LITE TEST  STRP (GLUCOSE BLOOD) use two to three times a day as directed NOVOLIN N 100 UNIT/ML SUSP (INSULIN ISOPHANE HUMAN) 30 Units with meals and at bedtime BD U/F SHORT PEN NEEDLE 31G X 8 MM MISC (INSULIN PEN NEEDLE) use as  directed CHLORTHALIDONE 25 MG TABS (CHLORTHALIDONE) Take one-half  tablet by mouth daily CITALOPRAM HYDROBROMIDE 20 MG TABS (CITALOPRAM HYDROBROMIDE) take one tablet by mouth daily TRAZODONE HCL 50 MG TABS (TRAZODONE HCL) 1/2 - 1 by mouth at bedtime as needed insomnia WARFARIN SODIUM 5 MG TABS (WARFARIN SODIUM) Use as directed by Anticoagulation Clinic OMEPRAZOLE 20 MG CPDR (OMEPRAZOLE) one by mouth daily * UROCRIT Take 1 tablet by mouth three times a day with meals FISH OIL 1000 MG CAPS (OMEGA-3 FATTY ACIDS) 3 once daily FLORA-Q  CAPS (PROBIOTIC PRODUCT) 2 by mouth two times a day GLUCOPHAGE 1000 MG TABS (METFORMIN HCL) 1/2 tablet two times a day--HOLD ANUSOL-HC 25 MG SUPP (HYDROCORTISONE ACETATE) 1 per rectum nightly for 1 week then as needed for bleeding hemorrhoids HUMULIN R U-500 (CONCENTRATED) 500 UNIT/ML SOLN (INSULIN REGULAR HUMAN) 10 units with each meal.   Anticoagulation Visit Questionnaire      Coumadin dose missed/changed:  No      Abnormal Bleeding Symptoms:  No   Any diet changes including alcohol intake, vegetables or greens since the last visit:  No Any illnesses or hospitalizations since the last visit:  No Any signs of clotting since the last visit (including chest discomfort, dizziness, shortness of breath, arm tingling, slurred speech, swelling or redness in leg):  No

## 2010-10-28 NOTE — Assessment & Plan Note (Signed)
Summary: 2 month rov/njr/pts wife rsc/cjr/PT/RCD   Vital Signs:  Patient profile:   60 year old male Weight:      267 pounds BMI:     37.37 Temp:     98.9 degrees F oral Pulse rate:   72 / minute Pulse rhythm:   regular Resp:     12 per minute BP sitting:   116 / 74  (left arm) Cuff size:   regular  Vitals Entered By: Gladis Riffle, RN (Feb 16, 2010 9:31 AM) CC: 2 month rov, has increased novolin N to 30 units since off metformin (diarrhea)--still has some episodes of diarrhea--CBGs fasting 180-280, 100-175 after meds at home Is Patient Diabetic? Yes Did you bring your meter with you today? No   Primary Care Provider:  Birdie Sons MD  CC:  2 month rov, has increased novolin N to 30 units since off metformin (diarrhea)--still has some episodes of diarrhea--CBGs fasting 180-280, and 100-175 after meds at home.  History of Present Illness:  Follow-Up Visit      This is a 60 year old man who presents for Follow-up visit.  The patient denies chest pain and palpitations.  Since the last visit the patient notes no new problems or concerns (has developed a sunburn---lower extremity).  The patient reports taking meds as prescribed.  When questioned about possible medication side effects, the patient notes none.    pt states that walking for 5-6 minutes causes dyspnea out of proportion to work effort. States he can exercise in other ways without sxs.   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 Take 1 Tablet By Mouth Once A Day 2)  Metoprolol Tartrate 25 Mg Tabs (Metoprolol Tartrate) .... Take 1 Tablet By Mouth Two Times A Day 3)  Nitroquick 0.4 Mg Subl (Nitroglycerin) .... Place 1 Tablet Under Tongue As Needed 4)  Plavix 75 Mg Tabs (Clopidogrel Bisulfate) .... Take 1 Tablet By Mouth Once A Day 5)  Levemir Flexpen 100 Unit/ml Soln (Insulin Detemir) .... 60units Subcutaneously Two Times A Day or As Directed 6)  Crestor 20 Mg  Tabs (Rosuvastatin Calcium) .Marland Kitchen.. 1 By Mouth Once Daily 7)  Benicar 40 Mg  Tabs (Olmesartan Medoxomil) .Marland Kitchen.. 1 By Mouth Once Daily 8)  Freestyle Lite Test  Strp (Glucose Blood) .... Use Two To Three Times A Day As Directed 9)  Fish Oil 1200 Mg  Caps (Omega-3 Fatty Acids) .... Once Daily 10)  Red Yeast Rice 600 Mg  Caps (Red Yeast Rice Extract) .... Once Daily 11)  Novolin N 100 Unit/ml Susp (Insulin Isophane Human) .... 30 Units With Meals and At Bedtime 12)  Bd U/f Short Pen Needle 31g X 8 Mm Misc (Insulin Pen Needle) .Marland KitchenMarland KitchenMarland Kitchen  Use As Directed 13)  Chlorthalidone 25 Mg Tabs (Chlorthalidone) .... Take One-Half  Tablet By Mouth Daily 14)  Citalopram Hydrobromide 20 Mg Tabs (Citalopram Hydrobromide) .... Take One Tablet By Mouth Daily 15)  Trazodone Hcl 50 Mg Tabs (Trazodone Hcl) .... 1/2 - 1 By Mouth At Bedtime As Needed Insomnia 16)  Warfarin Sodium 5 Mg Tabs (Warfarin Sodium) .... Take 1 Tablet By Mouth At Bedtime 17)  Omeprazole 20 Mg Cpdr (Omeprazole) .... One By Mouth Daily 18)  Urocrit .... Take 1 Tablet By Mouth Three Times A Day With Meals  Allergies: 1)  Feldene (Piroxicam)  Past History:  Past Medical History: Last updated:  02/27/2009 RENAL ARTERY STENOSIS (ICD-440.1) CORONARY ARTERY DISEASE (ICD-414.00) HYPERLIPIDEMIA (ICD-272.4) HYPERTENSION (ICD-401.9) OBESITY-MORBID (>100') (ICD-278.01) PSORIASIS (ICD-696.1) COLONIC POLYPS, ADENOMATOUS (ICD-211.3) ESOPHAGEAL STRICTURE (ICD-530.3) DUODENITIS, WITHOUT HEMORRHAGE (ICD-535.60) SLEEP APNEA, OBSTRUCTIVE (ICD-327.23) DIABETES MELLITUS, TYPE II, UNCONTROLLED (ICD-250.02) GERD (ICD-530.81) Pulmonary embolism, hx of Nephrolithiasis, hx of Depression  Past Surgical History: Last updated: 08/15/2008 Colonoscopy-03/06/2007 EGD-10/03/2002 RCA Stent x 2 Knee arthroscopy Angioplasty - 2005 Knee Arthroscopy- 2007 PTCA/stent---2009  Family History: Last updated: 07/14/2009 Family History Diabetes 1st degree relative brother deceased-dm, htn, chf Family History of Coronary Artery Disease:  mother pulmonary embolus 2010 (factor V deficiency)  Social History: Last updated: 01/16/2009 Married Never Smoked Full Time Alcohol Use - no Drug Use - no  Risk Factors: Exercise: no (06/08/2007)  Risk Factors: Smoking Status: never (02/16/2010)  Physical Exam  General:  Well-developed,well-nourished,in no acute distress; alert,appropriate and cooperative throughout examination obese Head:  normocephalic and atraumatic.   Eyes:  pupils equal and pupils round.   Ears:  R ear normal and L ear normal.   Neck:  No deformities, masses, or tenderness noted. Chest Wall:  No deformities, masses, tenderness or gynecomastia noted. Lungs:  Normal respiratory effort, chest expands symmetrically. Lungs are clear to auscultation, no crackles or wheezes. Heart:  normal rate and regular rhythm.   Abdomen:  soft and non-tender.   obese Msk:  No deformity or scoliosis noted of thoracic or lumbar spine.   Neurologic:  cranial nerves II-XII intact and gait normal.   Skin:  turgor normal and color normal.   Psych:  good eye contact and not anxious appearing.      Impression & Recommendations:  Problem # 1:  DIARRHEA (ICD-787.91)  much better off the metformin still with some symptoms trial probiotics  His updated medication list for this problem includes:    Flora-q Caps (Probiotic product) .Marland Kitchen... 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 continue current medications   His updated medication list for this problem includes:    Metoprolol Tartrate 25 Mg Tabs (Metoprolol tartrate) .Marland Kitchen... Take 1 tablet by mouth two times a day    Nitroquick 0.4 Mg Subl (Nitroglycerin) .Marland Kitchen... Place 1 tablet under tongue as needed    Plavix 75 Mg Tabs (Clopidogrel bisulfate) .Marland Kitchen... Take 1 tablet by mouth once a day    Benicar 40 Mg Tabs (Olmesartan medoxomil) .Marland Kitchen... 1 by mouth once daily    Chlorthalidone 25 Mg Tabs (Chlorthalidone) .Marland Kitchen... Take one-half  tablet by mouth daily  Orders: Cardiology Referral (Cardiology)  Problem # 4:  HYPERLIPIDEMIA (ICD-272.4)  note TGs add increased fishoil His updated medication list for this problem includes:    Crestor 20 Mg Tabs (Rosuvastatin calcium) .Marland Kitchen... 1 by mouth once daily  Orders:  Cardiology Referral (Cardiology)  Complete Medication List: 1)  Cetirizine Hcl 10 Mg Tabs (Cetirizine hcl) .... Take 1 tablet by mouth once a day 2)  Metoprolol Tartrate 25 Mg Tabs (Metoprolol tartrate) .... Take 1 tablet by mouth two times a day 3)  Nitroquick 0.4 Mg Subl (Nitroglycerin) .... Place 1 tablet under tongue as needed 4)  Plavix 75 Mg Tabs (Clopidogrel bisulfate) .... Take 1 tablet by mouth once a day 5)  Levemir Flexpen 100 Unit/ml Soln (Insulin detemir) .... 60units subcutaneously two times a day or as directed 6)  Crestor 20 Mg Tabs (Rosuvastatin calcium) .Marland Kitchen.. 1 by mouth once daily 7)  Benicar 40 Mg Tabs (Olmesartan medoxomil) .Marland Kitchen.. 1 by mouth once daily 8)  Freestyle Lite Test Strp (Glucose blood) .... Use  two to three times a day as directed 9)  Novolin N 100 Unit/ml Susp (Insulin isophane human) .... 30 units with meals and at bedtime 10)  Bd U/f Short Pen Needle 31g X 8 Mm Misc (Insulin pen needle) .... Use as directed 11)  Chlorthalidone 25 Mg Tabs (Chlorthalidone) .... Take one-half  tablet by mouth daily 12)  Citalopram Hydrobromide 20 Mg Tabs (Citalopram hydrobromide) .... Take one tablet by mouth daily 13)  Trazodone Hcl 50 Mg Tabs (Trazodone hcl) .... 1/2 - 1 by mouth at bedtime as needed insomnia 14)  Warfarin Sodium 5 Mg Tabs (Warfarin sodium) .... Take 1 tablet by mouth at bedtime 15)  Omeprazole 20 Mg Cpdr (Omeprazole) .... One by mouth daily 16)  Urocrit  .... Take 1 tablet by mouth three times a day with meals 17)  Fish Oil 1000 Mg Caps (Omega-3 fatty acids) .... 3 once daily 18)  Flora-q Caps (Probiotic product) .... 2 by mouth two times a day  Other Orders: Protime ([id number]) Fingerstick (21308) Tdap => 82yrs IM (65784) Admin 1st Vaccine (69629)  Patient Instructions: 1)  Please schedule a follow-up appointment in 1 month. with protime  Laboratory Results   Blood Tests   Date/Time Recieved: Feb 16, 2010 9:28 AM  Date/Time Reported: Feb 16, 2010 9:28 AM   PT: 17.6 s   (Normal Range: 10.6-13.4)  INR: 2.0   (Normal Range: 0.88-1.12   Therap INR: 2.0-3.5) Comments: Wynona Canes, CMA  Feb 16, 2010 9:28 AM       ANTICOAGULATION RECORD PREVIOUS REGIMEN & LAB RESULTS Anticoagulation Diagnosis:  v58.83,v58.61,415.19 on  12/03/2009 Previous INR Goal Range:  2.5-3.5 on  12/10/2009 Previous INR:  1.7 on  02/04/2010 Previous Coumadin Dose(mg):  7.5mg ,5mg  alt. on  12/10/2009 Previous Regimen:  5mg . M, W, F all others 7.5mg  on  02/04/2010 Previous Coagulation Comments:  Missed 1 day on  12/03/2009  NEW REGIMEN & LAB RESULTS Current INR: 2.0 Current Coumadin Dose(mg): 5mg  on m,w,f 7.5mg  other days Regimen: 5mg . M, W, F all others 7.5mg   (no  change)  MEDICATIONS CETIRIZINE HCL 10 MG TABS (CETIRIZINE HCL) Take 1 tablet by mouth once a day METOPROLOL TARTRATE 25 MG TABS (METOPROLOL TARTRATE) Take 1 tablet by mouth two times a day NITROQUICK 0.4 MG SUBL (NITROGLYCERIN) Place 1 tablet under tongue as needed PLAVIX 75 MG TABS (CLOPIDOGREL BISULFATE) Take 1 tablet by mouth once a day LEVEMIR FLEXPEN 100 UNIT/ML SOLN (INSULIN DETEMIR) 60units subcutaneously two times a day or as directed CRESTOR 20 MG  TABS (ROSUVASTATIN CALCIUM) 1 by mouth once daily BENICAR 40 MG  TABS (OLMESARTAN MEDOXOMIL) 1 by mouth once daily FREESTYLE LITE TEST  STRP (GLUCOSE BLOOD) use two to three  times a day as directed NOVOLIN N 100 UNIT/ML SUSP (INSULIN ISOPHANE HUMAN) 30 Units with meals and at bedtime BD U/F SHORT PEN NEEDLE 31G X 8 MM MISC (INSULIN PEN NEEDLE) use as directed CHLORTHALIDONE 25 MG TABS (CHLORTHALIDONE) Take one-half  tablet by mouth daily CITALOPRAM HYDROBROMIDE 20 MG TABS (CITALOPRAM HYDROBROMIDE) take one tablet by mouth daily TRAZODONE HCL 50 MG TABS (TRAZODONE HCL) 1/2 - 1 by mouth at bedtime as needed insomnia WARFARIN SODIUM 5 MG TABS (WARFARIN SODIUM) Take 1 tablet by mouth at bedtime OMEPRAZOLE 20 MG CPDR (OMEPRAZOLE) one by mouth daily * UROCRIT Take 1 tablet by mouth three times a day with meals FISH OIL 1000 MG CAPS (OMEGA-3 FATTY ACIDS) 3 once daily FLORA-Q  CAPS (PROBIOTIC PRODUCT) 2 by mouth two times a day   Anticoagulation Visit Questionnaire      Coumadin dose missed/changed:  No      Abnormal Bleeding Symptoms:  No   Any diet changes including alcohol intake, vegetables or greens since the last visit:  No Any illnesses or hospitalizations since the last visit:  No Any signs of clotting since the last visit (including chest discomfort, dizziness, shortness of breath, arm tingling, slurred speech, swelling or redness in leg):  No    Tetanus Vaccine (to be given today)

## 2010-10-28 NOTE — Progress Notes (Signed)
Summary: Triage  Phone Note Call from Patient Call back at [phone] Cell   Caller: Patient Call For: Dr. Leone Payor Reason for Call: Talk to Nurse Summary of Call: Continues having rectal bleeding....would like to discuss Initial call taken by: Karna Christmas,  September 01, 2010 8:48 AM  Follow-up for Phone Call        Spoke with patient, he states his hemorrhoids are bleeding again. Paitent states that he has "factor 5" and Dr. Leone Payor told him to call if he started having problems with bleeding. Appt given to patient with Mike Gip, PA for 09/01/10 at 11am. Follow-up by: Selinda Michaels RN,  September 01, 2010 10:01 AM

## 2010-10-28 NOTE — Progress Notes (Signed)
Summary: discuss test, meds  Phone Note Call from Patient Call back at Home Phone [phone] Call back at Work Phone [phone]   Caller: Patient Reason for Call: Talk to Nurse Summary of Call:  per pt was told by ts to call this am and speak with lauren. pt aware that lauren is off today. discuss test, meds.  Initial call taken by: Lorne Skeens,  August 24, 2010 9:27 AM  Follow-up for Phone Call        08/24/10--10am--called pt and left mess stating lauren not in office today, but i will send her mess via computer to phone mr Jock 08/25/10 and go over test results with him Follow-up by: Ledon Snare, RN,  August 24, 2010 10:01 AM

## 2010-10-28 NOTE — Progress Notes (Signed)
Summary: REQUEST FOR LABS  Phone Note Call from Patient   Caller: Patient  [phone] Summary of Call: Pt called to adv that he has appt on 12/16 for follow-up with Dr Cato Mulligan but he normally has labwork done prior to his f/u appt..... No order for labs seen in EMR.... Can you advise what labs you would like ordered?  Initial call taken by: Debbra Riding,  August 26, 2010 11:23 AM  Follow-up for Phone Call        labs one week prior to visit lipids---272.4 lfts-995.2 bmet-995.2 A1C-250.02    Follow-up by: Birdie Sons MD,  August 26, 2010 3:21 PM  Additional Follow-up for Phone Call Additional follow up Details #1::        LMTCB....so lab appt can be scheduled one wk prior to his f/u appt with Dr Cato Mulligan on 12/16.  LIPIDS 272.4, LFTS / BMET 995.2, A1C 250.02   LMTCB..... Debbra Riding  August 26, 2010 4:55 PM  Additional Follow-up by: Debbra Riding,  August 26, 2010 3:51 PM    Additional Follow-up for Phone Call Additional follow up Details #2::    Pt called back - scheduled for fasting labs at [facility] on 12/6.  Follow-up by: Debbra Riding,  August 27, 2010 9:39 AM

## 2010-10-28 NOTE — Assessment & Plan Note (Signed)
Summary: Cardiology Nuclear Testing  Nuclear Med Background Indications for Stress Test: Evaluation for Ischemia, Stent Patency, PTCA Patency   History: Angioplasty, Echo, Heart Catheterization, Myocardial Infarction, Myocardial Perfusion Study, Stents  History Comments: '08 [id number]  Symptoms: Chest Pain, Chest Pain with Exertion, DOE, Fatigue  Symptoms Comments: CP>jaw. Last episode of WU:JWJX night, 9:00 p.m.   Nuclear Pre-Procedure Cardiac Risk Factors: Family History - CAD, Hypertension, IDDM Type 2, Lipids, Obesity Caffeine/Decaff Intake: None NPO After: 5:30 PM Lungs: Clear IV 0.9% NS with Angio Cath: 22g     IV Site: R Antecubital IV Started by: Irean Hong, RN Chest Size (in) 54     Height (in): 71 Weight (lb): 267 BMI: 37.37 Tech Comments: Metoprolol held x 24 hours.  No insulin today, and 1/2 dose of insulin last night, FBS here 308. Patsy Edwards,RN.  Nuclear Med Study 1 or 2 day study:  1 day     Stress Test Type:  Stress Reading MD:  Arvilla Meres, MD     Referring MD:  Shawnie Pons, MD Resting Radionuclide:  Technetium 34m Tetrofosmin     Resting Radionuclide Dose:  11 mCi  Stress Radionuclide:  Technetium 47m Tetrofosmin     Stress Radionuclide Dose:  33 mCi   Stress Protocol Exercise Time (min):  5:15 min     Max HR:  144 bpm     Predicted Max HR:  163 bpm  Max Systolic BP: 165 mm Hg     Percent Max HR:  88.34 %     METS: 6.8 Rate Pressure Product:  [id number]    Stress Test Technologist:  [employer], CMA-N     Nuclear Technologist:  Domenic Polite, CNMT  Rest Procedure  Myocardial perfusion imaging was performed at rest 45 minutes following the intravenous administration of Technetium 97m Tetrofosmin.  Stress Procedure  The patient exercised for  5:15.  The patient stopped due to fatigue and chest/jaw tightness, 8/10, 2-minutes into exercise.  There were no diagnostic ST-T wave changes; but he did continue to have chest and jaw tightness, that  was relieved after NTG x 2. Technetium 57m Tetrofosmin was injected at peak exercise and myocardial perfusion imaging was performed after a brief delay.  Dr. Myrtis Ser here to see patient prior to stress images.  Chest and jaw pain came back prior to stress images.  Patient seen by Dr. Myrtis Ser after stress images and admitted for cath.  QPS Raw Data Images:  Normal; no motion artifact; normal heart/lung ratio. Stress Images:  Markedly decreased uptake in the inferior wall. Rest Images:  Normal homogeneous uptake in all areas of the myocardium. Subtraction (SDS):  There is a significant reversibe defect throughout the entire inferior wall concerning for ischemia. Transient Ischemic Dilatation:  .70  (Normal <1.22)  Lung/Heart Ratio:  .32  (Normal <0.45)  Quantitative Gated Spect Images QGS EDV:  73 ml QGS ESV:  29 ml QGS EF:  60 % QGS cine images:  Hypokinesis of the basilar to mid inferior and spetal walls.  Findings Abnormal nuclear study      Overall Impression  Exercise Capacity: Fair exercise capacity. BP Response: Normal blood pressure response. Clinical Symptoms: Chest and jaw pain beginning in stage I of exercise and persisting into recovery. ECG Impression: No significant ST segment change suggestive of ischemia. Overall Impression: Abnormal stress nuclear study. Overall Impression Comments: There is a significant reversibe defect throughout the entire inferior wall concerning for ischemia.  Appended Document: Cardiology Nuclear Testing Patient admitted and is  undergoing cardiac cath study. TS

## 2010-10-28 NOTE — Miscellaneous (Signed)
Summary: [place] Physician Order/Treatment Plan   [facility] Physician Order/Treatment Plan   Imported By: Roderic Ovens 08/10/2010 11:38:17  _____________________________________________________________________  External Attachment:    Type:   Image     Comment:   External Document

## 2010-10-28 NOTE — Assessment & Plan Note (Signed)
Summary: rectal bleeding/[name]   History of Present Illness Visit Type: consult  Primary GI MD: Stan Head MD [facility] Primary Provider: Birdie Sons, MD  Requesting Provider: Shawnie Pons, MD  Chief Complaint: Rectal bleeding  History of Present Illness:   60 yo wm with several months of rectal bleeding. He has been tired and fatigued lately. He has had bright red blood on toilet paper since hospitalization in July, when he had PTCA of stenosed stent. Was on ASA x 2 weeks and now on Plavix and warfarin chronically.   GI Review of Systems      Denies abdominal pain, acid reflux, belching, bloating, chest pain, dysphagia with liquids, dysphagia with solids, heartburn, loss of appetite, nausea, vomiting, vomiting blood, weight loss, and  weight gain.      Reports rectal bleeding.     Denies anal fissure, black tarry stools, change in bowel habit, constipation, diarrhea, diverticulosis, fecal incontinence, heme positive stool, hemorrhoids, irritable bowel syndrome, jaundice, light color stool, liver problems, and  rectal pain.    Clinical Reports Reviewed:  Colonoscopy:  03/06/2007:  Done  03/06/2007:  Results: Hemorrhoids.     Location:  [facility].   03/06/2007:  Results: Hemorrhoids.        Current Medications (verified): 1)  Cetirizine Hcl 10 Mg Tabs (Cetirizine Hcl) .... Take 1 Tablet By Mouth Once A Day 2)  Metoprolol Tartrate 25 Mg Tabs (Metoprolol Tartrate) .... Take 1 Tablet By Mouth Two Times A Day 3)  Nitroquick 0.4 Mg Subl (Nitroglycerin) .... Place 1 Tablet Under Tongue As Needed 4)  Plavix 75 Mg Tabs (Clopidogrel Bisulfate) .... Take 1 Tablet By Mouth Once A Day 5)  Levemir Flexpen 100 Unit/ml Soln (Insulin Detemir) .... 60units Subcutaneously Two Times A Day or As Directed 6)  Crestor 20 Mg  Tabs (Rosuvastatin Calcium) .Marland Kitchen.. 1 By Mouth Once Daily 7)  Benicar 40 Mg  Tabs (Olmesartan Medoxomil) .Marland Kitchen.. 1 By Mouth Once Daily 8)  Freestyle Lite Test   Strp (Glucose Blood) .... Use Two To Three Times A Day As Directed 9)  Novolin N 100 Unit/ml Susp (Insulin Isophane Human) .... 30 Units With Meals and At Bedtime 10)  Bd U/f Short Pen Needle 31g X 8 Mm Misc (Insulin Pen Needle) .... Use As Directed 11)  Chlorthalidone 25 Mg Tabs (Chlorthalidone) .... Take One-Half  Tablet By Mouth Daily 12)  Citalopram Hydrobromide 20 Mg Tabs (Citalopram Hydrobromide) .... Take One Tablet By Mouth Daily 13)  Trazodone Hcl 50 Mg Tabs (Trazodone Hcl) .... 1/2 - 1 By Mouth At Bedtime As Needed Insomnia 14)  Warfarin Sodium 5 Mg Tabs (Warfarin Sodium) .... Use As Directed By Anticoagulation Clinic 15)  Omeprazole 20 Mg Cpdr (Omeprazole) .... One By Mouth Daily 16)  Urocrit .... Take 1 Tablet By Mouth Three Times A Day With Meals 17)  Fish Oil 1000 Mg Caps (Omega-3 Fatty Acids) .... 3 Once Daily 18)  Flora-Q  Caps (Probiotic Product) .... 2 By Mouth Two Times A Day 19)  Glucophage 1000 Mg Tabs (Metformin Hcl) .... 1/2 Tablet Two Times A Day  Allergies (verified): 1)  Feldene (Piroxicam)  Past History:  Past Medical History: RENAL ARTERY STENOSIS (ICD-440.1) CORONARY ARTERY DISEASE (ICD-414.00) HYPERLIPIDEMIA (ICD-272.4) HYPERTENSION (ICD-401.9) OBESITY-MORBID (>100') (ICD-278.01) PSORIASIS (ICD-696.1) ESOPHAGEAL STRICTURE (ICD-530.3) DUODENITIS, WITHOUT HEMORRHAGE (ICD-535.60) SLEEP APNEA, OBSTRUCTIVE (ICD-327.23) DIABETES MELLITUS, TYPE II, UNCONTROLLED (ICD-250.02) GERD (ICD-530.81) Pulmonary embolism, hx of Nephrolithiasis, hx of Depression Factor V Leiden deficiency  Past Surgical History: Colonoscopy-03/06/2007 EGD-10/03/2002 RCA  Stent x 2 Knee arthroscopy Angioplasty - 2005 & 2011 Knee Arthroscopy- 2007 PTCA/stent---2009  Family History: Family History Diabetes 1st degree relative brother deceased-dm, htn, chf Family History of Coronary Artery Disease:  mother pulmonary embolus 2010 (factor V deficiency) No FH of Colon  Cancer:  Social History: Real Psychologist, occupational Married 2 childern  Patient 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  Alcohol Use - no Daily Caffeine Use: one daily  Illicit Drug Use - no  Review of Systems       The patient complains of arthritis/joint pain, fatigue, headaches-new, shortness of breath, and swelling of feet/legs.    Vital Signs:  Patient profile:   60 year old male Height:      71 inches Weight:      270 pounds BMI:     37.79 BSA:     2.40 Pulse rate:   76 / minute Pulse rhythm:   regular BP sitting:   124 / 84  (left arm) Cuff size:   regular  Vitals Entered By: Ok Anis CMA (April 28, 2010 3:44 PM)  Physical Exam  General:  Well developed, well nourished, in no acute distress.obese.   Eyes:  anicteric Lungs:  Clear throughout to auscultation. Heart:  Regular rate and rhythm; no murmurs, rubs,  or bruits. Abdomen:  soft and non-tender.   obese RUQ and midline herniae/diastasis recti no HSM/mass Rectal:  mild erythema near anus psoriatic changes on buttocks soft brown stool without mass ANOSCOPY: hemorrhoids in canal   Impression & Recommendations:  Problem # 1:  HEMORRHOIDS, WITH BLEEDING (ICD-455.8) This is cause of rectal bleeding. Known from 2008 colonoscopy, seen at anoscopy and clinical scenario compatible. Will treat with hydrocortisone suppositories.  recent ASA and now with INR increased suspect contrivutors. I think it is ok to go to [facility] (returns 8/6). He was advised to seek care if bbeleeding changes.  Problem # 2:  ANEMIA-UNSPECIFIED (ICD-285.9) mld 12.6 to 12.2 since hopsitalization no real change. will recheck in 1 month. suspect multifactorial loss and not just from anorectal bleeding.  Problem # 3:  COUMADIN THERAPY (ICD-V58.61) Assessment: Deteriorated INR is elevated (greater than therapeutic rabnge as it is >4) hold warfarin tonight and ask Dr. Cato Mulligan about next lab draw for PT/INR suspect will need an INR 8/8  Patient  Instructions: 1)  Please pick up your medications at your pharmacy.  2)  Please HOLD coumadin (warfarin) tonight.  3)  Please call Dr. Cato Mulligan for further coumadin dosing. 4)  Please call back for worsening pain or bleeding.  5)  The medication list was reviewed and reconciled.  All changed / newly prescribed medications were explained.  A complete medication list was provided to the patient / caregiver. Prescriptions: ANUSOL-HC 25 MG SUPP (HYDROCORTISONE ACETATE) 1 per rectum nightly for 1 week then as needed for bleeding hemorrhoids  #21 x 0   Entered and Authorized by:   Iva Boop MD, [facility]   Signed by:   Iva Boop MD, FACG on 04/28/2010   Method used:   Electronically to        [facility]  [address] [id number] (retail)       [address]       [place], [place]  [address]       Ph: [phone]       Fax: [phone]   RxID:   [id number]   Appended Document: rectal bleeding/[name] Dr. Cato Mulligan is out of office,  he will need some advice re: adjustment of warfarin since INR is elevated. He held tonight's dose and we will contact Coumadin clinic for further advice since Dr. Cato Mulligan is unavailable.  Appended Document: rectal bleeding/[name] per Alcario Drought in coumadin clinic, they would refer pt back to one of Dr. Agnes [facility] partners.  Pt has called Dr. Marliss Coots office this morning for coumadin adjustment.

## 2010-10-28 NOTE — Progress Notes (Signed)
Summary: pt had some chest pain and jaw pain  Phone Note Call from Patient Call back at [phone]   Caller: Spouse Reason for Call: Talk to Nurse, Talk to Doctor Summary of Call: pt was having some chest pain off and on for about a week and last night he was having some jaw pain and wants to be seen today Initial call taken by: Omer Jack,  July 05, 2010 10:11 AM  Follow-up for Phone Call        [name]'s wife is calling today for her husband. He has had intermittent chest pain for the past several days and yesterday evening had jaw pain.  She does not know if he had taken any NTG during these episodes.  Reviewed with wife NTG instructions and if continued to have angina to call EMS.  He is currently working & is not alone.  I have made him an appt. for 10/11 at 11:00 with Dr. Riley Kill.      Appended Document: pt had some chest pain and jaw pain Patient seen in clinic the following day to assess.  TS

## 2010-10-28 NOTE — Letter (Signed)
[facility]   Imported By: Sherian Rein 12/28/2009 13:12:03  _____________________________________________________________________  External Attachment:    Type:   Image     Comment:   External Document

## 2010-10-28 NOTE — Assessment & Plan Note (Signed)
Summary: [id number]   Visit Type:  Follow-up Primary Provider:  Birdie Sons MD  CC:  shortness of breath.  History of Present Illness: Has been having alot of shortness of breath with activity.  Is in rehab, and the staff has been working with him.  He thinks he may be out of shape.  However, he is working 50-60 hours/week.  Nutritionist and nurses have worked with him.    Current Medications (verified): 1)  Cetirizine Hcl 10 Mg Tabs (Cetirizine Hcl) .... Take 1 Tablet By Mouth Once A Day 2)  Metoprolol Tartrate 25 Mg Tabs (Metoprolol Tartrate) .... Take 1 Tablet By Mouth Two Times A Day 3)  Nitroquick 0.4 Mg Subl (Nitroglycerin) .... Place 1 Tablet Under Tongue As Needed 4)  Plavix 75 Mg Tabs (Clopidogrel Bisulfate) .... Take 1 Tablet By Mouth Once A Day 5)  Levemir Flexpen 100 Unit/ml Soln (Insulin Detemir) .... 60units Subcutaneously Two Times A Day or As Directed 6)  Crestor 20 Mg  Tabs (Rosuvastatin Calcium) .Marland Kitchen.. 1 By Mouth Once Daily 7)  Benicar 40 Mg  Tabs (Olmesartan Medoxomil) .Marland Kitchen.. 1 By Mouth Once Daily 8)  Freestyle Lite Test  Strp (Glucose Blood) .... Use Two To Three Times A Day As Directed 9)  Novolin N 100 Unit/ml Susp (Insulin Isophane Human) .... 30 Units With Meals and At Bedtime 10)  Bd U/f Short Pen Needle 31g X 8 Mm Misc (Insulin Pen Needle) .... Use As Directed 11)  Chlorthalidone 25 Mg Tabs (Chlorthalidone) .... Take One-Half  Tablet By Mouth Daily 12)  Citalopram Hydrobromide 20 Mg Tabs (Citalopram Hydrobromide) .... Take One Tablet By Mouth Daily 13)  Trazodone Hcl 50 Mg Tabs (Trazodone Hcl) .... 1/2 - 1 By Mouth At Bedtime As Needed Insomnia 14)  Warfarin Sodium 5 Mg Tabs (Warfarin Sodium) .... Use As Directed By Anticoagulation Clinic 15)  Omeprazole 20 Mg Cpdr (Omeprazole) .... One By Mouth Daily 16)  Urocrit .... Take 1 Tablet By Mouth Three Times A Day With Meals 17)  Fish Oil 1000 Mg Caps (Omega-3 Fatty Acids) .... 3 Once Daily 18)  Flora-Q  Caps (Probiotic  Product) .... 2 By Mouth Two Times A Day 19)  Glucophage 1000 Mg Tabs (Metformin Hcl) .... 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.3) ESOPHAGEAL STRICTURE (ICD-530.3) DUODENITIS, WITHOUT HEMORRHAGE (ICD-535.60) SLEEP APNEA, OBSTRUCTIVE (ICD-327.23) DIABETES MELLITUS, TYPE II, UNCONTROLLED (ICD-250.02) GERD (ICD-530.81) Pulmonary embolism, hx of Nephrolithiasis, hx of Depression  Vital Signs:  Patient profile:   60 year old male Height:      71 inches Weight:      270 pounds BMI:     37.79 O2 Sat:      95 % on Room air Pulse rate:   73 / minute BP sitting:   122 / 86  Vitals Entered By: Laurance Flatten CMA (March 22, 2010 2:00 PM)  O2 Flow:  Room air  Physical Exam  General:  Well developed, well nourished, in no acute distress. Head:  normocephalic and atraumatic Eyes:  PERRLA/EOM intact; conjunctiva and lids normal. Lungs:  Clear bilaterally to auscultation and percussion. Heart:  Non-displaced PMI, chest non-tender; regular rate and rhythm, S1, S2 without murmurs, rubs or gallops. Carotid upstroke normal, no bruit. Extremities:  trace edema. Neurologic:  Alert and oriented x  3.   EKG  Procedure date:  03/22/2010  Findings:      NSR.  First degree av block.  Left axis deviation.   Impression & Recommendations:  Problem # 1:  DYSPNEA (ICD-786.05)  Symptoms have improved a little, and could be deconditioning.   However, fairly SOB with exertion.  Will check POET, and also do echo to reassess RV function.   His updated medication list for this problem includes:    Metoprolol Tartrate 25 Mg Tabs (Metoprolol tartrate) .Marland Kitchen... Take 1 tablet by mouth two  times a day    Benicar 40 Mg Tabs (Olmesartan medoxomil) .Marland Kitchen... 1 by mouth once daily    Chlorthalidone 25 Mg Tabs (Chlorthalidone) .Marland Kitchen... Take one-half  tablet by mouth daily  Orders: EKG w/ Interpretation (93000) Echocardiogram (Echo) Treadmill (Treadmill)  Problem # 2:  CORONARY ARTERY DISEASE (ICD-414.00)  remains of plavix.  No chest pain.   His updated medication list for this problem includes:    Metoprolol Tartrate 25 Mg Tabs (Metoprolol tartrate) .Marland Kitchen... Take 1 tablet by mouth two times a day    Nitroquick 0.4 Mg Subl (Nitroglycerin) .Marland Kitchen... Place 1 tablet under tongue as needed    Plavix 75 Mg Tabs (Clopidogrel bisulfate) .Marland Kitchen... Take 1 tablet by mouth once a day    Warfarin Sodium 5 Mg Tabs (Warfarin sodium) ..... Use as directed by anticoagulation clinic  Orders: EKG w/ Interpretation (93000) Echocardiogram (Echo) Treadmill (Treadmill)  Problem # 3:  PULMONARY EMBOLISM (ICD-415.19)  remains on warfarin at present.   However, potential for recurrence.  Should be safe, however.  Will recheck echocardiogram.   His updated medication list for this problem includes:    Plavix 75 Mg Tabs (Clopidogrel bisulfate) .Marland Kitchen... Take 1 tablet by mouth once a day    Warfarin Sodium 5 Mg Tabs (Warfarin sodium) ..... Use as directed by anticoagulation clinic  His updated medication list for this problem includes:    Plavix 75 Mg Tabs (Clopidogrel bisulfate) .Marland Kitchen... Take 1 tablet by mouth once a day    Warfarin Sodium 5 Mg Tabs (Warfarin sodium) ..... Use as directed by anticoagulation clinic  Problem # 4:  HYPERTENSION (ICD-401.9) controlled, but could have some impact.   His updated medication list for this problem includes:    Metoprolol Tartrate 25 Mg Tabs (Metoprolol tartrate) .Marland Kitchen... Take 1 tablet by mouth two times a day    Benicar 40 Mg Tabs (Olmesartan medoxomil) .Marland Kitchen... 1 by mouth once daily    Chlorthalidone 25 Mg Tabs (Chlorthalidone) .Marland Kitchen... Take one-half  tablet by mouth daily  Patient  Instructions: 1)  Your physician has requested that you have an echocardiogram.  Echocardiography is a painless test that uses sound waves to create images of your heart. It provides your doctor with information about the size and shape of your heart and how well your heart's chambers and valves are working.  This procedure takes approximately one hour. There are no restrictions for this procedure. 2)  Your physician has requested that you have an exercise tolerance test.  For further information please visit [email or web address]  Please also follow instruction sheet, as given. 3)  Your physician recommends that you continue on your current medications as directed. Please refer to the Current Medication list given to you today.

## 2010-10-28 NOTE — Assessment & Plan Note (Signed)
Summary: ROV   Visit Type:  Follow-up Referring Provider:  Shawnie Pons, MD  Primary Provider:  Birdie Sons, MD   CC:  No complains.  History of Present Illness: Patient doing well.  He has played some golf.  No chest pain.  Bleeding hemorrhoids have resolved.  Recently discharged from hospital.   Patient still has diahreaa, burping, belching and it is recurrent and sporadic.     DISCHARGE DIAGNOSES:   1. Coronary artery disease.       a.     Cardiac catheterization April 05, 2010:  Severe in-stent        restenosis in the mid right coronary artery, otherwise, patent        circumflex stent and mild nonobstructive disease.       b.     Successful percutaneous coronary intervention using cutting        balloon angioplasty of the severe in-stent restenosis in the mid        right coronary artery.       c.     Discharged on 81 mg enteric-coated aspirin p.o. daily x2        weeks, Plavix 75 mg p.o. daily, and chronic anticoagulation on        Coumadin.   2. Factor V Leiden deficiency with history of pulmonary embolus.       a.     Kept for 24 hours observation given above in setting of        subtherapeutic INR.  Determined to have atypical symptoms for        pulmonary embolism and discharged at higher INR than at admission.   3. Hypertriglyceridemia (triglycerides 471).      Current Medications (verified): 1)  Cetirizine Hcl 10 Mg Tabs (Cetirizine Hcl) .... Take 1 Tablet By Mouth Once A Day 2)  Metoprolol Tartrate 25 Mg Tabs (Metoprolol Tartrate) .... Take 1 Tablet By Mouth Two Times A Day 3)  Nitroquick 0.4 Mg Subl (Nitroglycerin) .... Place 1 Tablet Under Tongue As Needed 4)  Plavix 75 Mg Tabs (Clopidogrel Bisulfate) .... Take 1 Tablet By Mouth Once A Day 5)  Levemir Flexpen 100 Unit/ml Soln (Insulin Detemir) .... 60units Subcutaneously Two Times A Day or As Directed 6)  Crestor 20 Mg  Tabs (Rosuvastatin Calcium) .Marland Kitchen.. 1 By Mouth Once Daily 7)  Benicar 40 Mg  Tabs (Olmesartan  Medoxomil) .Marland Kitchen.. 1 By Mouth Once Daily 8)  Freestyle Lite Test  Strp (Glucose Blood) .... Use Two To Three Times A Day As Directed 9)  Novolin N 100 Unit/ml Susp (Insulin Isophane Human) .... 30 Units With Meals and At Bedtime 10)  Bd U/f Short Pen Needle 31g X 8 Mm Misc (Insulin Pen Needle) .... Use As Directed 11)  Chlorthalidone 25 Mg Tabs (Chlorthalidone) .... Take One-Half  Tablet By Mouth Daily 12)  Citalopram Hydrobromide 20 Mg Tabs (Citalopram Hydrobromide) .... Take One Tablet By Mouth Daily 13)  Trazodone Hcl 50 Mg Tabs (Trazodone Hcl) .... 1/2 - 1 By Mouth At Bedtime As Needed Insomnia 14)  Warfarin Sodium 5 Mg Tabs (Warfarin Sodium) .... Use As Directed By Anticoagulation Clinic 15)  Omeprazole 20 Mg Cpdr (Omeprazole) .... One By Mouth Daily 16)  Urocrit .... Take 1 Tablet By Mouth Three Times A Day With Meals 17)  Fish Oil 1000 Mg Caps (Omega-3 Fatty Acids) .... 3 Once Daily 18)  Flora-Q  Caps (Probiotic Product) .... 2 By Mouth Two Times A Day 19)  Glucophage 1000  Mg Tabs (Metformin Hcl) .... 1/2 Tablet Two Times A Day--Hold 20)  Anusol-Hc 25 Mg Supp (Hydrocortisone Acetate) .Marland Kitchen.. 1 Per Rectum Nightly For 1 Week Then As Needed For Bleeding Hemorrhoids  Allergies: 1)  Feldene (Piroxicam)  Past History:  Past Medical History: Last updated: 04/28/2010 RENAL ARTERY STENOSIS (ICD-440.1) CORONARY ARTERY DISEASE (ICD-414.00) HYPERLIPIDEMIA (ICD-272.4) HYPERTENSION (ICD-401.9) OBESITY-MORBID (>100') (ICD-278.01) PSORIASIS (ICD-696.1) ESOPHAGEAL STRICTURE (ICD-530.3) DUODENITIS, WITHOUT HEMORRHAGE (ICD-535.60) SLEEP APNEA, OBSTRUCTIVE (ICD-327.23) DIABETES MELLITUS, TYPE II, UNCONTROLLED (ICD-250.02) GERD (ICD-530.81) Pulmonary embolism, hx of Nephrolithiasis, hx of Depression Factor V Leiden deficiency  Past Surgical History: Last updated: 04/28/2010 Colonoscopy-03/06/2007 EGD-10/03/2002 RCA Stent x 2 Knee arthroscopy Angioplasty - 2005 & 2011 Knee Arthroscopy-  2007 PTCA/stent---2009  Family History: Last updated: 04/28/2010 Family History Diabetes 1st degree relative brother deceased-dm, htn, chf Family History of Coronary Artery Disease:  mother pulmonary embolus 2010 (factor V deficiency) No FH of Colon Cancer:  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  Alcohol Use - no Daily Caffeine Use: one daily  Illicit Drug Use - no  Vital Signs:  Patient profile:   60 year old male Height:      71 inches Weight:      265 pounds BMI:     37.09 Pulse rate:   76 / minute Pulse rhythm:   regular Resp:     18 per minute BP sitting:   104 / 70  (left arm) Cuff size:   large  Vitals Entered By: Vikki Ports (May 24, 2010 3:49 PM)  Physical Exam  General:  Well developed, well nourished, in no acute distress. Head:  normocephalic and atraumatic Eyes:  PERRLA/EOM intact; conjunctiva and lids normal. Lungs:  Clear bilaterally to auscultation and percussion. Heart:  PMI non displaced.  Normal S1 and S2.   Extremities:  No clubbing or cyanosis. Neurologic:  Alert and oriented x 3.   Impression & Recommendations:  Problem # 1:  CORONARY ARTERY DISEASE (ICD-414.00) stable at present.  NO recurrent symptoms.  Tolerating meds at present.   recent ISR treated with PCI.  Remains on combo of warfarin and clopidogrel as outlined. His updated medication list for this problem includes:    Metoprolol Tartrate 25 Mg Tabs (Metoprolol tartrate) .Marland Kitchen... Take 1 tablet by mouth two times a day    Nitroquick 0.4 Mg Subl (Nitroglycerin) .Marland Kitchen... Place 1 tablet under tongue as needed    Plavix 75 Mg Tabs (Clopidogrel bisulfate) .Marland Kitchen... Take 1 tablet by mouth once a day    Warfarin Sodium 5 Mg Tabs (Warfarin sodium) ..... Use as directed by anticoagulation clinic  Problem # 2:  FACTOR V DEFICIENCY (ICD-286.3) Chronic lifelong treatment.  Problem # 3:  ABDOMINAL PAIN (ICD-789.00) continues  to have symptoms as noted above.  Will defer to GI and Dr. Cato Mulligan.  Problem # 4:  HEMORRHOIDS, WITH BLEEDING (ICD-455.8) Improved without further difficulty.  Last Hgb was 12.8  Patient Instructions: 1)  Your physician recommends that you continue on your current medications as directed. Please refer to the Current Medication list given to you today. 2)  Your physician wants you to follow-up in:  6 MONTHS.  You will receive a reminder letter in the mail two months in advance. If you don't receive a letter, please call our office to schedule the follow-up appointment.

## 2010-10-28 NOTE — Medication Information (Signed)
Summary: rov/ej  Anticoagulant Therapy  Managed by: Earvin Hansen, PharmD Referring MD: Shawnie Pons, MD PCP: Birdie Sons, MD  Supervising MD: Juanda Chance MD, Bruce Indication 1: v58.83,v58.61,415.19 Indication 2: Factor V def Lab Used: [facility] [place] Site: [address] INR POC 3.4 INR RANGE 2.5-3.5  Dietary changes: no    Health status changes: no    Bleeding/hemorrhagic complications: no    Recent/future hospitalizations: no    Any changes in medication regimen? yes       Details: ASA 325 changed to ASA 81 mg  Recent/future dental: no  Any missed doses?: no       Is patient compliant with meds? yes       Allergies: 1)  ! Vioxx 2)  Feldene (Piroxicam)  Anticoagulation Management History:      Positive risk factors for bleeding include presence of serious comorbidities.  Negative risk factors for bleeding include an age less than 60 years old and no history of CVA/TIA.  The bleeding index is 'intermediate risk'.  Positive CHADS2 values include History of HTN and History of Diabetes.  Negative CHADS2 values include Age > 59 years old and Prior Stroke/CVA/TIA.  His last INR was 2.2.  Anticoagulation responsible provider: Juanda Chance MD, Smitty Cords.  INR POC: 3.4.  Exp: 07/2011.    Anticoagulation Management Assessment/Plan:      The patient's current anticoagulation dose is Warfarin sodium 5 mg tabs: Use as directed by Anticoagulation Clinic.  The target INR is 2.5-3.5.  The next INR is due 09/28/2010.  Anticoagulation instructions were given to patient.  Results were reviewed/authorized by Earvin Hansen, PharmD.  He was notified by Earvin Hansen PharmD.         Prior Anticoagulation Instructions: INR 2.2  Take 2 tablets today then resume same dose of 1 1/2 tablets every day except 1 tablet on Tuesday, Thursday and Saturday.  Recheck INR in 2 weeks.   Current Anticoagulation Instructions: Continue taking coumadin as scheduled with 1 tab (5 mg) on  Tues, Thurs, and Saturday and 1 1/2 tablets (7.5 mg) on Sundays, Mondays, Wednesdays, and Fridays.

## 2010-10-28 NOTE — Progress Notes (Signed)
Summary: diarrhea  Phone Note Call from Patient   Caller: Patient Call For: Birdie Sons MD Summary of Call: Diarrhea is getting worse, more pain this weekend.  [phone], [phone] See Dr Cato Mulligan or Dr. Leone Payor Initial call taken by: Lynann Beaver CMA,  May 17, 2010 9:00 AM  Follow-up for Phone Call        stop metformin use immodium for 2 days call us on Wed or Thurs Follow-up by: Birdie Sons MD,  May 17, 2010 9:44 AM  Additional Follow-up for Phone Call Additional follow up Details #1::        Left detailed message on pt's personal voice mai. Additional Follow-up by: Lynann Beaver CMA,  May 17, 2010 10:06 AM     Appended Document: diarrhea Pt. called for appt.

## 2010-10-28 NOTE — Assessment & Plan Note (Signed)
Summary: pt/njr  Nurse Visit   Allergies: 1)  Feldene (Piroxicam) Laboratory Results   Blood Tests   Date/Time Received: December 10, 2009 10:54 AM  Date/Time Reported: December 10, 2009 10:54 AM   PT: 17.3 s   (Normal Range: 10.6-13.4)  INR: 2.0   (Normal Range: 0.88-1.12   Therap INR: 2.0-3.5) Comments: Wynona Canes, CMA  December 10, 2009 10:54 AM     Orders Added: 1)  Est. Patient Level I [99211] 2)  Fingerstick [16109]  Laboratory Results   Blood Tests     PT: 17.3 s   (Normal Range: 10.6-13.4)  INR: 2.0   (Normal Range: 0.88-1.12   Therap INR: 2.0-3.5) Comments: Wynona Canes, CMA  December 10, 2009 10:54 AM       ANTICOAGULATION RECORD PREVIOUS REGIMEN & LAB RESULTS Anticoagulation Diagnosis:  v58.83,v58.61,415.19 on  12/03/2009 Previous INR Goal Range:  2.5-3.5 on  11/20/2009 Previous INR:  1.2 on  12/03/2009 Previous Coumadin Dose(mg):  7.5mg ,5mg ,5mg  alt. on  11/20/2009 Previous Regimen:  7.5mg ,5mg  alt. on  12/03/2009 Previous Coagulation Comments:  Missed 1 day on  12/03/2009  NEW REGIMEN & LAB RESULTS Current INR Goal Range: 2.5-3.5 Current INR: 2.0 Current Coumadin Dose(mg): 7.5mg ,5mg  alt. Regimen: same dose  Provider: Dr.Kelilah Hebard      Repeat testing in: 4 weeks MEDICATIONS CETIRIZINE HCL 10 MG TABS (CETIRIZINE HCL) Take 1 tablet by mouth once a day METOPROLOL TARTRATE 25 MG TABS (METOPROLOL TARTRATE) Take 1 tablet by mouth two times a day NITROQUICK 0.4 MG SUBL (NITROGLYCERIN) Place 1 tablet under tongue as needed PLAVIX 75 MG TABS (CLOPIDOGREL BISULFATE) Take 1 tablet by mouth once a day LEVEMIR FLEXPEN 100 UNIT/ML SOLN (INSULIN DETEMIR) 60units subcutaneously two times a day or as directed CRESTOR 20 MG  TABS (ROSUVASTATIN CALCIUM) 1 by mouth once daily BENICAR 40 MG  TABS (OLMESARTAN MEDOXOMIL) 1 by mouth once daily FREESTYLE LITE TEST  STRP (GLUCOSE BLOOD) use two times a day as directed FISH OIL 1200 MG  CAPS (OMEGA-3 FATTY ACIDS) once  daily RED YEAST RICE 600 MG  CAPS (RED YEAST RICE EXTRACT) once daily NOVOLIN N 100 UNIT/ML SUSP (INSULIN ISOPHANE HUMAN) 20 Units with meals BD U/F SHORT PEN NEEDLE 31G X 8 MM MISC (INSULIN PEN NEEDLE) use as directed CHLORTHALIDONE 25 MG TABS (CHLORTHALIDONE) Take one-half  tablet by mouth daily CITALOPRAM HYDROBROMIDE 20 MG TABS (CITALOPRAM HYDROBROMIDE) take one tablet by mouth daily TRAZODONE HCL 50 MG TABS (TRAZODONE HCL) 1/2 - 1 by mouth at bedtime as needed insomnia METFORMIN HCL 1000 MG TABS (METFORMIN HCL) Take 1/2  tablet by mouth two times a day WARFARIN SODIUM 5 MG TABS (WARFARIN SODIUM) Take 1 tablet by mouth at bedtime OMEPRAZOLE 20 MG CPDR (OMEPRAZOLE) one by mouth daily   Anticoagulation Visit Questionnaire      Coumadin dose missed/changed:  No      Abnormal Bleeding Symptoms:  No   Any diet changes including alcohol intake, vegetables or greens since the last visit:  No Any illnesses or hospitalizations since the last visit:  No Any signs of clotting since the last visit (including chest discomfort, dizziness, shortness of breath, arm tingling, slurred speech, swelling or redness in leg):  No

## 2010-10-28 NOTE — Letter (Signed)
Summary: [facility]  [facility]   Imported By: Maryln Gottron 06/24/2010 10:50:27  _____________________________________________________________________  External Attachment:    Type:   Image     Comment:   External Document

## 2010-10-28 NOTE — Assessment & Plan Note (Signed)
Summary: 2 month rov.nrj (PT TO LAB FOR P/T) ----PT [facility] // RS   Vital Signs:  Patient profile:   60 year old male Weight:      267 pounds BMI:     37.37 Temp:     98.3 degrees F oral Pulse rate:   60 / minute Pulse rhythm:   regular Resp:     12 per minute BP sitting:   124 / 78  (left arm) Cuff size:   regular  Vitals Entered By: Gladis Riffle, RN (May 12, 2010 11:00 AM)  Nutrition Counseling: Patient's BMI is greater than 25 and therefore counseled on weight management options. CC: 2 month rov, needs PT in lab--CBGs 200 in AM and 120 after meds at home Is Patient Diabetic? Yes Did you bring your meter with you today? No   Primary Care Angelito Hopping:  Birdie Sons, MD   CC:  2 month rov and needs PT in lab--CBGs 200 in AM and 120 after meds at home.  History of Present Illness:  Follow-Up Visit      This is a 60 year old man who presents for Follow-up visit.  The patient denies chest pain and palpitations.  Since the last visit the patient notes a recent hospitilization and being seen by a specialist.  The patient reports taking meds as prescribed.  When questioned about possible medication side effects, the patient notes none.  reviewed hospital notes, clinic notes from dr stucky and dr Leone Payor  pt continues to complain of chronic fatigue---lack of energy. no specific sxs.   All other systems reviewed and were negative   Preventive Screening-Counseling & Management  Alcohol-Tobacco     Smoking Status: never  Current Medications (verified): 1)  Cetirizine Hcl 10 Mg Tabs (Cetirizine Hcl) .... Take 1 Tablet By Mouth Once A Day 2)  Metoprolol Tartrate 25 Mg Tabs (Metoprolol Tartrate) .... Take 1 Tablet By Mouth Two Times A Day 3)  Nitroquick 0.4 Mg Subl (Nitroglycerin) .... Place 1 Tablet Under Tongue As Needed 4)  Plavix 75 Mg Tabs (Clopidogrel Bisulfate) .... Take 1 Tablet By Mouth Once A Day 5)  Levemir Flexpen 100 Unit/ml Soln (Insulin Detemir) .... 60units Subcutaneously  Two Times A Day or As Directed 6)  Crestor 20 Mg  Tabs (Rosuvastatin Calcium) .Marland Kitchen.. 1 By Mouth Once Daily 7)  Benicar 40 Mg  Tabs (Olmesartan Medoxomil) .Marland Kitchen.. 1 By Mouth Once Daily 8)  Freestyle Lite Test  Strp (Glucose Blood) .... Use Two To Three Times A Day As Directed 9)  Novolin N 100 Unit/ml Susp (Insulin Isophane Human) .... 30 Units With Meals and At Bedtime 10)  Bd U/f Short Pen Needle 31g X 8 Mm Misc (Insulin Pen Needle) .... Use As Directed 11)  Chlorthalidone 25 Mg Tabs (Chlorthalidone) .... Take One-Half  Tablet By Mouth Daily 12)  Citalopram Hydrobromide 20 Mg Tabs (Citalopram Hydrobromide) .... Take One Tablet By Mouth Daily 13)  Trazodone Hcl 50 Mg Tabs (Trazodone Hcl) .... 1/2 - 1 By Mouth At Bedtime As Needed Insomnia 14)  Warfarin Sodium 5 Mg Tabs (Warfarin Sodium) .... Use As Directed By Anticoagulation Clinic 15)  Omeprazole 20 Mg Cpdr (Omeprazole) .... One By Mouth Daily 16)  Urocrit .... Take 1 Tablet By Mouth Three Times A Day With Meals 17)  Fish Oil 1000 Mg Caps (Omega-3 Fatty Acids) .... 3 Once Daily 18)  Flora-Q  Caps (Probiotic Product) .... 2 By Mouth Two Times A Day 19)  Glucophage 1000 Mg Tabs (Metformin  Hcl) .... 1/2 Tablet Two Times A Day 20)  Anusol-Hc 25 Mg Supp (Hydrocortisone Acetate) .Marland Kitchen.. 1 Per Rectum Nightly For 1 Week Then As Needed For Bleeding Hemorrhoids  Allergies: 1)  Feldene (Piroxicam)  Past History:  Past Medical History: Last updated: 04/28/2010 RENAL ARTERY STENOSIS (ICD-440.1) CORONARY ARTERY DISEASE (ICD-414.00) HYPERLIPIDEMIA (ICD-272.4) HYPERTENSION (ICD-401.9) OBESITY-MORBID (>100') (ICD-278.01) PSORIASIS (ICD-696.1) ESOPHAGEAL STRICTURE (ICD-530.3) DUODENITIS, WITHOUT HEMORRHAGE (ICD-535.60) SLEEP APNEA, OBSTRUCTIVE (ICD-327.23) DIABETES MELLITUS, TYPE II, UNCONTROLLED (ICD-250.02) GERD (ICD-530.81) Pulmonary embolism, hx of Nephrolithiasis, hx of Depression Factor V Leiden deficiency  Past Surgical History: Last updated:  04/28/2010 Colonoscopy-03/06/2007 EGD-10/03/2002 RCA Stent x 2 Knee arthroscopy Angioplasty - 2005 & 2011 Knee Arthroscopy- 2007 PTCA/stent---2009  Family History: Last updated: 04/28/2010 Family History Diabetes 1st degree relative brother deceased-dm, htn, chf Family History of Coronary Artery Disease:  mother pulmonary embolus 2010 (factor V deficiency) No FH of Colon Cancer:  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  Alcohol Use - no Daily Caffeine Use: one daily  Illicit Drug Use - no  Risk Factors: Exercise: no (06/08/2007)  Risk Factors: Smoking Status: never (05/12/2010)  Physical Exam  General:  Well-developed,well-nourished,in no acute distress; alert,appropriate and cooperative throughout examination obese Head:  normocephalic and atraumatic.   Eyes:  pupils equal and pupils round.   Ears:  R ear normal and L ear normal.   Neck:  No deformities, masses, or tenderness noted. Chest Wall:  No deformities, masses, tenderness or gynecomastia noted. Lungs:  normal respiratory effort and no intercostal retractions.   Heart:  normal rate and regular rhythm.   Abdomen:  Bowel sounds positive,abdomen soft and non-tender without masses, organomegaly or hernias noted. Msk:  No deformity or scoliosis noted of thoracic or lumbar spine.   Pulses:  R radial normal and L radial normal.   Neurologic:  cranial nerves II-XII intact and gait normal.   Skin:  turgor normal and color normal.   Psych:  good eye contact and not anxious appearing.     Impression & Recommendations:  Problem # 1:  HEMORRHOIDS, WITH BLEEDING (ICD-455.8) resolved  Problem # 2:  DYSPNEA (ICD-786.05) suspect this is more deconditioning  Problem # 3:  CORONARY ARTERY DISEASE (ICD-414.00) no sxs continue current medications  His updated medication list for this problem includes:    Metoprolol Tartrate 25 Mg Tabs (Metoprolol  tartrate) .Marland Kitchen... Take 1 tablet by mouth two times a day    Nitroquick 0.4 Mg Subl (Nitroglycerin) .Marland Kitchen... Place 1 tablet under tongue as needed    Plavix 75 Mg Tabs (Clopidogrel bisulfate) .Marland Kitchen... Take 1 tablet by mouth once a day    Benicar 40 Mg Tabs (Olmesartan medoxomil) .Marland Kitchen... 1 by mouth once daily    Chlorthalidone 25 Mg Tabs (Chlorthalidone) .Marland Kitchen... Take one-half  tablet by mouth daily  Labs Reviewed: Chol: 188 (02/04/2010)   HDL: 32.30 (02/04/2010)   LDL: DEL (11/17/2008)   TG: 485.0 (02/04/2010)  Lipid Goals: Chol Goal: 200 (04/26/2007)   HDL Goal: 40 (04/26/2007)   LDL Goal: 100 (04/26/2007)   TG Goal: 150 (04/26/2007)  Problem # 4:  HYPERTENSION (ICD-401.9) controlled continue current medications  His updated medication list for this problem includes:    Metoprolol Tartrate 25 Mg Tabs (Metoprolol tartrate) .Marland Kitchen... Take 1 tablet by mouth two times a day    Benicar 40 Mg Tabs (Olmesartan medoxomil) .Marland Kitchen... 1 by mouth once daily    Chlorthalidone 25 Mg Tabs (Chlorthalidone) .Marland Kitchen... Take one-half  tablet by mouth daily  BP today: 124/78 Prior BP: 124/84 (04/28/2010)  Prior 10 Yr Risk Heart Disease: N/A (04/26/2007)  Labs Reviewed: K+: 4.6 (02/04/2010) Creat: : 0.9 (02/04/2010)   Chol: 188 (02/04/2010)   HDL: 32.30 (02/04/2010)   LDL: DEL (11/17/2008)   TG: 485.0 (02/04/2010)  Problem # 5:  PULMONARY EMBOLISM (ICD-415.19)  His updated medication list for this problem includes:    Plavix 75 Mg Tabs (Clopidogrel bisulfate) .Marland Kitchen... Take 1 tablet by mouth once a day    Warfarin Sodium 5 Mg Tabs (Warfarin sodium) ..... Use as directed by anticoagulation clinic  Reviewed the following: PT: 45.4 (04/28/2010)   INR: 2.2 (05/03/2010)    Next Protime: 4 weeks (dated on 05/03/2010)  Complete Medication List: 1)  Cetirizine Hcl 10 Mg Tabs (Cetirizine hcl) .... Take 1 tablet by mouth once a day 2)  Metoprolol Tartrate 25 Mg Tabs (Metoprolol tartrate) .... Take 1 tablet by mouth two times a day 3)   Nitroquick 0.4 Mg Subl (Nitroglycerin) .... Place 1 tablet under tongue as needed 4)  Plavix 75 Mg Tabs (Clopidogrel bisulfate) .... Take 1 tablet by mouth once a day 5)  Levemir Flexpen 100 Unit/ml Soln (Insulin detemir) .... 60units subcutaneously two times a day or as directed 6)  Crestor 20 Mg Tabs (Rosuvastatin calcium) .Marland Kitchen.. 1 by mouth once daily 7)  Benicar 40 Mg Tabs (Olmesartan medoxomil) .Marland Kitchen.. 1 by mouth once daily 8)  Freestyle Lite Test Strp (Glucose blood) .... Use two to three times a day as directed 9)  Novolin N 100 Unit/ml Susp (Insulin isophane human) .... 30 units with meals and at bedtime 10)  Bd U/f Short Pen Needle 31g X 8 Mm Misc (Insulin pen needle) .... Use as directed 11)  Chlorthalidone 25 Mg Tabs (Chlorthalidone) .... Take one-half  tablet by mouth daily 12)  Citalopram Hydrobromide 20 Mg Tabs (Citalopram hydrobromide) .... Take one tablet by mouth daily 13)  Trazodone Hcl 50 Mg Tabs (Trazodone hcl) .... 1/2 - 1 by mouth at bedtime as needed insomnia 14)  Warfarin Sodium 5 Mg Tabs (Warfarin sodium) .... Use as directed by anticoagulation clinic 15)  Omeprazole 20 Mg Cpdr (Omeprazole) .... One by mouth daily 16)  Urocrit  .... Take 1 tablet by mouth three times a day with meals 17)  Fish Oil 1000 Mg Caps (Omega-3 fatty acids) .... 3 once daily 18)  Flora-q Caps (Probiotic product) .... 2 by mouth two times a day 19)  Glucophage 1000 Mg Tabs (Metformin hcl) .... 1/2 tablet two times a day 20)  Anusol-hc 25 Mg Supp (Hydrocortisone acetate) .Marland Kitchen.. 1 per rectum nightly for 1 week then as needed for bleeding hemorrhoids  Other Orders: Venipuncture (24401) TLB-CBC Platelet - w/Differential (85025-CBCD) TLB-Lipid Panel (80061-LIPID) TLB-BMP (Basic Metabolic Panel-BMET) (80048-METABOL) TLB-Hepatic/Liver Function Pnl (80076-HEPATIC) TLB-TSH (Thyroid Stimulating Hormone) (84443-TSH) TLB-Sedimentation Rate (ESR) (85652-ESR) TLB-Testosterone, Total (84403-TESTO)  Patient  Instructions: 1)  Please schedule a follow-up appointment in 4 months.  Appended Document: Orders Update    Clinical Lists Changes  Orders: Added new Service order of [facility] [phone]) - Signed Observations: Added new observation of CUR. REGIMEN: same (05/12/2010 11:34) Added new observation of NEXT PT: 4 weeks (05/12/2010 11:34) Added new observation of INR: 2.6  (05/12/2010 11:34) Added new observation of COMMENTS2: Rita Ohara  May 12, 2010 11:35 AM   (05/12/2010 11:34) Added new observation of ABNORM BLEED: No  (05/12/2010 11:34) Added new observation of COUMADIN CHG: No  (05/12/2010 11:34)  ANTICOAGULATION RECORD PREVIOUS REGIMEN & LAB RESULTS Anticoagulation Diagnosis:  v58.83,v58.61,415.19 on  12/03/2009 Previous INR Goal Range:  2.5-3.5 on  12/10/2009 Previous INR:  2.2 on  05/03/2010 Previous Coumadin Dose(mg):  5mg  on m,w,f 7.5mg  other days on  02/16/2010 Previous Regimen:  same on  05/03/2010 Previous Coagulation Comments:  Missed 1 day on  12/03/2009  NEW REGIMEN & LAB RESULTS Current INR: 2.6 Regimen: same  Repeat testing in: 4 weeks  Anticoagulation Visit Questionnaire Coumadin dose missed/changed:  No Abnormal Bleeding Symptoms:  No  Any diet changes including alcohol intake, vegetables or greens since the last visit:  No Any illnesses or hospitalizations since the last visit:  No Any signs of clotting since the last visit (including chest discomfort, dizziness, shortness of breath, arm tingling, slurred speech, swelling or redness in leg):  No  MEDICATIONS CETIRIZINE HCL 10 MG TABS (CETIRIZINE HCL) Take 1 tablet by mouth once a day METOPROLOL TARTRATE 25 MG TABS (METOPROLOL TARTRATE) Take 1 tablet by mouth two times a day NITROQUICK 0.4 MG SUBL (NITROGLYCERIN) Place 1 tablet under tongue as needed PLAVIX 75 MG TABS (CLOPIDOGREL BISULFATE) Take 1 tablet by mouth once a day LEVEMIR FLEXPEN 100 UNIT/ML SOLN (INSULIN DETEMIR) 60units  subcutaneously two times a day or as directed CRESTOR 20 MG  TABS (ROSUVASTATIN CALCIUM) 1 by mouth once daily BENICAR 40 MG  TABS (OLMESARTAN MEDOXOMIL) 1 by mouth once daily FREESTYLE LITE TEST  STRP (GLUCOSE BLOOD) use two to three times a day as directed NOVOLIN N 100 UNIT/ML SUSP (INSULIN ISOPHANE HUMAN) 30 Units with meals and at bedtime BD U/F SHORT PEN NEEDLE 31G X 8 MM MISC (INSULIN PEN NEEDLE) use as directed CHLORTHALIDONE 25 MG TABS (CHLORTHALIDONE) Take one-half  tablet by mouth daily CITALOPRAM HYDROBROMIDE 20 MG TABS (CITALOPRAM HYDROBROMIDE) take one tablet by mouth daily TRAZODONE HCL 50 MG TABS (TRAZODONE HCL) 1/2 - 1 by mouth at bedtime as needed insomnia WARFARIN SODIUM 5 MG TABS (WARFARIN SODIUM) Use as directed by Anticoagulation Clinic OMEPRAZOLE 20 MG CPDR (OMEPRAZOLE) one by mouth daily * UROCRIT Take 1 tablet by mouth three times a day with meals FISH OIL 1000 MG CAPS (OMEGA-3 FATTY ACIDS) 3 once daily FLORA-Q  CAPS (PROBIOTIC PRODUCT) 2 by mouth two times a day GLUCOPHAGE 1000 MG TABS (METFORMIN HCL) 1/2 tablet two times a day ANUSOL-HC 25 MG SUPP (HYDROCORTISONE ACETATE) 1 per rectum nightly for 1 week then as needed for bleeding hemorrhoids    Laboratory Results   Blood Tests      INR: 2.6   (Normal Range: 0.88-1.12   Therap INR: 2.0-3.5) Comments: Rita Ohara  May 12, 2010 11:35 AM

## 2010-10-28 NOTE — Assessment & Plan Note (Signed)
Summary: 1 month rov/pt/njr   Vital Signs:  Patient profile:   60 year old male Weight:      270 pounds BMI:     37.79 Temp:     98.6 degrees F oral Pulse rate:   68 / minute Pulse rhythm:   regular Resp:     12 per minute BP sitting:   118 / 68  (left arm) Cuff size:   regular  Vitals Entered By: Gladis Riffle, RN (March 16, 2010 11:45 AM) CC: 1 month rov, CBGs 111-360 at home, states run better when watches what eats and exercising Is Patient Diabetic? Yes Did you bring your meter with you today? No   Primary Care Provider:  Birdie Sons MD  CC:  1 month rov, CBGs 111-360 at home, and states run better when watches what eats and exercising.  History of Present Illness:  Follow-Up Visit      This is a 60 year old man who presents for Follow-up visit.  The patient denies chest pain and palpitations.  Since the last visit the patient notes no new problems or concerns.  The patient reports taking meds as prescribed, not monitoring BP, monitoring blood sugars, and dietary noncompliance.  When questioned about possible medication side effects, the patient notes none.    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 Take 1 Tablet By Mouth Once A Day 2)  Metoprolol Tartrate 25 Mg Tabs (Metoprolol Tartrate) .... Take 1 Tablet By Mouth Two Times A Day 3)  Nitroquick 0.4 Mg Subl (Nitroglycerin) .... Place 1 Tablet Under Tongue As Needed 4)  Plavix 75 Mg Tabs (Clopidogrel Bisulfate) .... Take 1 Tablet By Mouth Once A Day 5)  Levemir Flexpen 100 Unit/ml Soln (Insulin Detemir) .... 60units Subcutaneously Two Times A Day or As Directed 6)  Crestor 20 Mg  Tabs (Rosuvastatin Calcium) .Marland Kitchen.. 1 By Mouth Once Daily 7)  Benicar 40 Mg  Tabs (Olmesartan Medoxomil) .Marland Kitchen.. 1 By Mouth Once Daily 8)  Freestyle Lite Test  Strp (Glucose Blood) .... Use Two To Three Times A Day As Directed 9)  Novolin N 100 Unit/ml Susp (Insulin Isophane Human) .... 30 Units With Meals and At Bedtime 10)  Bd U/f Short Pen Needle 31g X 8 Mm Misc (Insulin Pen Needle) .... Use As Directed 11)  Chlorthalidone 25 Mg Tabs (Chlorthalidone) .... Take One-Half  Tablet By Mouth Daily 12)  Citalopram Hydrobromide 20 Mg Tabs (Citalopram Hydrobromide) .... Take One Tablet By Mouth Daily 13)  Trazodone Hcl 50 Mg Tabs (Trazodone Hcl) .... 1/2 - 1 By Mouth At Bedtime As Needed Insomnia 14)  Warfarin Sodium 5 Mg Tabs (Warfarin Sodium) .... Take 1 Tablet By Mouth  At Bedtime 15)  Omeprazole 20 Mg Cpdr (Omeprazole) .... One By Mouth Daily 16)  Urocrit .... Take 1 Tablet By Mouth Three Times A Day With Meals 17)  Fish Oil 1000 Mg Caps (Omega-3 Fatty Acids) .... 3 Once Daily 18)  Flora-Q  Caps (Probiotic Product) .... 2 By Mouth Two Times A Day  Allergies: 1)  Feldene (Piroxicam)  Past History:  Past Medical History: Last updated: 02/27/2009 RENAL ARTERY STENOSIS (ICD-440.1) CORONARY ARTERY DISEASE (ICD-414.00) HYPERLIPIDEMIA (ICD-272.4) HYPERTENSION (ICD-401.9) OBESITY-MORBID (>100') (ICD-278.01) PSORIASIS (ICD-696.1) COLONIC POLYPS, ADENOMATOUS (ICD-211.3) ESOPHAGEAL STRICTURE  (ICD-530.3) DUODENITIS, WITHOUT HEMORRHAGE (ICD-535.60) SLEEP APNEA, OBSTRUCTIVE (ICD-327.23) DIABETES MELLITUS, TYPE II, UNCONTROLLED (ICD-250.02) GERD (ICD-530.81) Pulmonary embolism, hx of Nephrolithiasis, hx of Depression  Past Surgical History: Last updated: 08/15/2008 Colonoscopy-03/06/2007 EGD-10/03/2002 RCA Stent x 2 Knee arthroscopy Angioplasty - 2005 Knee Arthroscopy- 2007 PTCA/stent---2009  Family History: Last updated: 07/14/2009 Family History Diabetes 1st degree relative brother deceased-dm, htn, chf Family History of Coronary Artery Disease:  mother pulmonary embolus 2010 (factor V deficiency)  Social History: Last updated: 01/16/2009 Married Never Smoked Full Time Alcohol Use - no Drug Use - no  Risk Factors: Exercise: no (06/08/2007)  Risk Factors: Smoking Status: never (03/16/2010)  Physical Exam  General:  Well-developed,well-nourished,in no acute distress; alert,appropriate and cooperative throughout examination obese Head:  normocephalic and atraumatic.   Eyes:  pupils equal and pupils round.   Neck:  No deformities, masses, or tenderness noted. Chest Wall:  No deformities, masses, tenderness or gynecomastia noted. Lungs:  Normal respiratory effort, chest expands symmetrically. Lungs are clear to auscultation, no crackles or wheezes. Heart:  normal rate and regular rhythm.   Abdomen:  soft and non-tender.   obese Msk:  No deformity or scoliosis noted of thoracic or lumbar spine.   Neurologic:  cranial nerves II-XII intact and gait normal.   Skin:  turgor normal and color normal.     Impression & Recommendations:  Problem # 1:  PULMONARY EMBOLISM (ICD-415.19) with factor V deficiency life long warfarin His updated medication list for this problem includes:    Plavix 75 Mg Tabs (Clopidogrel bisulfate) .Marland Kitchen... Take 1 tablet by mouth once a day    Warfarin Sodium 5 Mg Tabs (Warfarin sodium) .Marland Kitchen... Take 1 tablet by mouth at bedtime  Problem  # 2:  HYPERTENSION (ICD-401.9) controlled continue current medications  His updated medication list for this problem includes:    Metoprolol Tartrate 25 Mg Tabs (Metoprolol tartrate) .Marland Kitchen... Take 1 tablet by mouth two times a day    Benicar 40 Mg Tabs (Olmesartan medoxomil) .Marland Kitchen... 1 by mouth once daily    Chlorthalidone 25 Mg Tabs (Chlorthalidone) .Marland Kitchen... Take one-half  tablet by mouth daily  BP today: 118/68 Prior BP: 116/74 (02/16/2010)  Prior 10 Yr Risk Heart Disease: N/A (04/26/2007)  Labs Reviewed: K+: 4.6 (02/04/2010) Creat: : 0.9 (02/04/2010)   Chol: 188 (02/04/2010)   HDL: 32.30 (02/04/2010)   LDL: DEL (11/17/2008)   TG: 485.0 (02/04/2010)  Problem # 3:  DIABETES MELLITUS, TYPE II, UNCONTROLLED (ICD-250.02) related to obesity desperately needs weight loss he voices understanding His updated medication list for this problem includes:    Levemir Flexpen 100 Unit/ml Soln (Insulin detemir) .Marland KitchenMarland KitchenMarland KitchenMarland Kitchen 60units subcutaneously two times a day or as directed    Benicar 40 Mg Tabs (Olmesartan medoxomil) .Marland Kitchen... 1 by mouth once daily    Novolin N 100 Unit/ml Susp (Insulin isophane human) .Marland KitchenMarland KitchenMarland KitchenMarland Kitchen 30 units with meals and at bedtime    Metformin Hcl 500 Mg Tabs (Metformin  hcl) ..... Take 1 tab twice daily  Labs Reviewed: Creat: 0.9 (02/04/2010)     Last Eye Exam: normal--pt's report (10/27/2009) Reviewed HgBA1c results: 8.8 (02/04/2010)  9.3 (11/12/2009)  Problem # 4:  DIARRHEA (ICD-787.91) sxs much better he is able to tolerate metformin His updated medication list for this problem includes:    Flora-q Caps (Probiotic product) .Marland Kitchen... 2 by mouth two times a day  Complete Medication List: 1)  Cetirizine Hcl 10 Mg Tabs (Cetirizine hcl) .... Take 1 tablet by mouth once a day 2)  Metoprolol Tartrate 25 Mg Tabs (Metoprolol tartrate) .... Take 1 tablet by mouth two times a day 3)  Nitroquick 0.4 Mg Subl (Nitroglycerin) .... Place 1 tablet under tongue as needed 4)  Plavix 75 Mg Tabs (Clopidogrel  bisulfate) .... Take 1 tablet by mouth once a day 5)  Levemir Flexpen 100 Unit/ml Soln (Insulin detemir) .... 60units subcutaneously two times a day or as directed 6)  Crestor 20 Mg Tabs (Rosuvastatin calcium) .Marland Kitchen.. 1 by mouth once daily 7)  Benicar 40 Mg Tabs (Olmesartan medoxomil) .Marland Kitchen.. 1 by mouth once daily 8)  Freestyle Lite Test Strp (Glucose blood) .... Use two to three times a day as directed 9)  Novolin N 100 Unit/ml Susp (Insulin isophane human) .... 30 units with meals and at bedtime 10)  Bd U/f Short Pen Needle 31g X 8 Mm Misc (Insulin pen needle) .... Use as directed 11)  Chlorthalidone 25 Mg Tabs (Chlorthalidone) .... Take one-half  tablet by mouth daily 12)  Citalopram Hydrobromide 20 Mg Tabs (Citalopram hydrobromide) .... Take one tablet by mouth daily 13)  Trazodone Hcl 50 Mg Tabs (Trazodone hcl) .... 1/2 - 1 by mouth at bedtime as needed insomnia 14)  Warfarin Sodium 5 Mg Tabs (Warfarin sodium) .... Take 1 tablet by mouth at bedtime 15)  Omeprazole 20 Mg Cpdr (Omeprazole) .... One by mouth daily 16)  Urocrit  .... Take 1 tablet by mouth three times a day with meals 17)  Fish Oil 1000 Mg Caps (Omega-3 fatty acids) .... 3 once daily 18)  Flora-q Caps (Probiotic product) .... 2 by mouth two times a day 19)  Metformin Hcl 500 Mg Tabs (Metformin hcl) .... Take 1 tab twice daily  Other Orders: Venipuncture (16109) Protime ([id number])  Patient Instructions: 1)  Please schedule a follow-up appointment in 2 months. 2)  labs one week prior to visit 3)  lipids---272.4 4)  lfts-995.2 5)  bmet-995.2 6)  A1C-250.02 7)     Prescriptions: METFORMIN HCL 500 MG TABS (METFORMIN HCL) Take 1 tab twice daily  #180 x 3   Entered and Authorized by:   Birdie Sons MD   Signed by:   Birdie Sons MD on 03/16/2010   Method used:   Historical   RxID:   [id number]   Laboratory Results   Blood Tests      INR: 2.0   (Normal Range: 0.88-1.12   Therap INR: 2.0-3.5) Comments: Rita Ohara  March 16, 2010 12:13 PM        ANTICOAGULATION RECORD PREVIOUS REGIMEN & LAB RESULTS Anticoagulation Diagnosis:  v58.83,v58.61,415.19 on  12/03/2009 Previous INR Goal Range:  2.5-3.5 on  12/10/2009 Previous INR:  2.0 on  02/16/2010 Previous Coumadin Dose(mg):  5mg  on m,w,f 7.5mg  other days on  02/16/2010 Previous Regimen:  5mg . M, W, F all others 7.5mg  on  02/04/2010 Previous Coagulation Comments:  Missed 1 day on  12/03/2009  NEW REGIMEN & LAB RESULTS Current INR: 2.0 Regimen:  same  Repeat testing in: 4 weeks  Anticoagulation Visit Questionnaire Coumadin dose missed/changed:  No Abnormal Bleeding Symptoms:  No  Any diet changes including alcohol intake, vegetables or greens since the last visit:  No Any illnesses or hospitalizations since the last visit:  No Any signs of clotting since the last visit (including chest discomfort, dizziness, shortness of breath, arm tingling, slurred speech, swelling or redness in leg):  No  MEDICATIONS CETIRIZINE HCL 10 MG TABS (CETIRIZINE HCL) Take 1 tablet by mouth once a day METOPROLOL TARTRATE 25 MG TABS (METOPROLOL TARTRATE) Take 1 tablet by mouth two times a day NITROQUICK 0.4 MG SUBL (NITROGLYCERIN) Place 1 tablet under tongue as needed PLAVIX 75 MG TABS (CLOPIDOGREL BISULFATE) Take 1 tablet by mouth once a day LEVEMIR FLEXPEN 100 UNIT/ML SOLN (INSULIN DETEMIR) 60units subcutaneously two times a day or as directed CRESTOR 20 MG  TABS (ROSUVASTATIN CALCIUM) 1 by mouth once daily BENICAR 40 MG  TABS (OLMESARTAN MEDOXOMIL) 1 by mouth once daily FREESTYLE LITE TEST  STRP (GLUCOSE BLOOD) use two to three times a day as directed NOVOLIN N 100 UNIT/ML SUSP (INSULIN ISOPHANE HUMAN) 30 Units with meals and at bedtime BD U/F SHORT PEN NEEDLE 31G X 8 MM MISC (INSULIN PEN NEEDLE) use as directed CHLORTHALIDONE 25 MG TABS (CHLORTHALIDONE) Take one-half  tablet by mouth daily CITALOPRAM HYDROBROMIDE 20 MG TABS (CITALOPRAM HYDROBROMIDE)  take one tablet by mouth daily TRAZODONE HCL 50 MG TABS (TRAZODONE HCL) 1/2 - 1 by mouth at bedtime as needed insomnia WARFARIN SODIUM 5 MG TABS (WARFARIN SODIUM) Take 1 tablet by mouth at bedtime OMEPRAZOLE 20 MG CPDR (OMEPRAZOLE) one by mouth daily * UROCRIT Take 1 tablet by mouth three times a day with meals FISH OIL 1000 MG CAPS (OMEGA-3 FATTY ACIDS) 3 once daily FLORA-Q  CAPS (PROBIOTIC PRODUCT) 2 by mouth two times a day METFORMIN HCL 500 MG TABS (METFORMIN HCL) Take 1 tab twice daily

## 2010-10-28 NOTE — Letter (Signed)
Summary: [facility]  [facility]   Imported By: Maryln Gottron 08/27/2010 13:59:23  _____________________________________________________________________  External Attachment:    Type:   Image     Comment:   External Document

## 2010-10-28 NOTE — Assessment & Plan Note (Signed)
Summary: appt at 3//blood from nose/lwb   Visit Type:  Follow-up Referring Provider:  Shawnie Pons, MD  Primary Provider:  Birdie Sons, MD   CC:  Blood from nose since yesterday.  History of Present Illness: Patient has had a nose bleed over the past two days.  It stopped around noon today.  He has had some blowing of the nose.  It has been a moderate amount.  Of note,  patient has factor V Leiden and is on coumadin after having a PE.  He also had restenting of the RCA in October, and had only 2% platelet inhibition with PRU of greater than 300 on clopidogrel.  As such, he was switched to prasugrel (Effient), with his greatest degree of inhibition being about 54%.  His INR has been a little high with this.  In church this Sunday he had two episodes of chest pain, and they resolved and have not recurred.  He took a nitroglycerin with relief.  ECG today is unchanged.  His hemorrhoidal bleeding has resolved.    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 Take 1 Tablet By Mouth Once A Day 2)  Metoprolol Tartrate 25 Mg Tabs (Metoprolol Tartrate) .... Take 1 Tablet By Mouth Two Times A Day 3)  Nitroquick 0.4 Mg Subl (Nitroglycerin) .... Place 1 Tablet Under Tongue As Needed 4)  Levemir Flexpen 100 Unit/ml Soln (Insulin Detemir) .... 60units Subcutaneously Two Times A Day or As Directed 5)  Crestor 20 Mg  Tabs (Rosuvastatin Calcium) .Marland Kitchen.. 1 By Mouth Once Daily 6)  Benicar 40 Mg  Tabs (Olmesartan Medoxomil) .Marland Kitchen.. 1 By Mouth Once Daily 7)  Freestyle Lite Test  Strp (Glucose Blood) .... Use Two To Three Times A Day As Directed 8)  Novolin N 100 Unit/ml Susp (Insulin Isophane Human) .... 30 Units Up To Three Times A Day 9)  Bd U/f Short Pen Needle 31g X 8 Mm Misc (Insulin Pen Needle) .... Use As Directed 10)  Chlorthalidone 25 Mg Tabs (Chlorthalidone) .... Take One-Half  Tablet By Mouth Daily 11)  Citalopram Hydrobromide 20 Mg Tabs (Citalopram Hydrobromide) .... Take One Tablet By Mouth Daily 12)  Trazodone Hcl 50 Mg Tabs (Trazodone Hcl) .... 1/2 - 1 By Mouth At Bedtime As Needed Insomnia 13)  Warfarin Sodium 5 Mg Tabs (Warfarin Sodium) .... Use As Directed By Anticoagulation Clinic 14)  Urocrit .... Take 1 Tablet By Mouth Three Times A Day With Meals 15)  Fish Oil 1000  Mg Caps (Omega-3 Fatty Acids) .... 3 Once Daily 16)  Align   Caps (Misc Intestinal Flora Regulat) .... Take One Capsule By Mouth Daily 17)  Glucophage 1000 Mg Tabs (Metformin Hcl) .... 1/2 Tablet Two Times A Day 18)  Anusol-Hc 25 Mg Supp (Hydrocortisone Acetate) .Marland Kitchen.. 1 Per Rectum Nightly For 1 Week Then As Needed For Bleeding Hemorrhoids 19)  Effient 10 Mg Tabs (Prasugrel Hcl) .... Take One Tablet By Mouth Daily 20)  Aspirin 81 Mg Tbec (Aspirin) .... Take One Tablet By Mouth Daily. On Hold 21)  Anusol-Hc 25 Mg Supp (Hydrocortisone Acetate) .... Use 1 Suppository At  Bedtime X 7 Days Then As Needed 22)  Miralax  Powd (Polyethylene Glycol 3350) .... Take 1 Dose 17 Gram in A Glass of Water Every Other Day  Allergies: 1)  ! Vioxx 2)  Feldene (Piroxicam)  Vital Signs:  Patient profile:   60 year old male Height:      71 inches Weight:      266 pounds BMI:     37.23 Pulse rate:   77 / minute Pulse rhythm:   irregular Resp:     18 per minute BP sitting:   100 / 60  (left arm) Cuff size:   large  Vitals Entered By: Vikki Ports (September 08, 2010 3:19 PM)  Physical Exam  General:  Well developed, well nourished, in no acute distress.  He does not look pale.  Head:  normocephalic and atraumatic Eyes:  PERRLA/EOM intact; conjunctiva and lids normal. Nose:  irritation of L nares on septum, less so on R septum. Extremities:  No clubbing or cyanosis. Neurologic:  Alert and oriented x 3.   EKG  Procedure date:  09/08/2010  Findings:      NSR with first degree, and left axis deviation.  No acute changes.  Impression & Recommendations:  Problem # 1:  EPISTAXIS (ICD-784.7) Moderate.  Has stopped.  Has irritation on nasal plexus bilaterally, left worse than right.  Will arrange office visit with Dr. Ezzard Standing.  He must remain on warfarin with Factor V Leiden, and on Effient with drug eluting stent in the RCA with overlapping and treatment of in stent restenosis.  Dr. Cyndie Chime is out until Friday, but will hold warfarin and recheck INR on Friday to see if 2.0 and revise to 2.0 to 3.0 for now.  Would lean toward slightly lower goal while on Effient.  Needs effient for minimum of six months, and preferably one year.    Problem # 2:  CORONARY ARTERY DISEASE (ICD-414.00) Had recurrent chest pain, but not clear if ischemic.  If recurrence, he knows to come to the hospital.  Also ECG is not abnormal.  Platelet inhibition is consistent with impact of diabetes on platelet reactivity.  Has high reactivity even on treatment, with non responsiveness to  clopidogrel, and some modified response to prasugrel.   Will continue with modified INR, and also with ASA on hold with reasonable platelet inhibition on prasugrel.  To see Dr. Cato Mulligan on Friday, and me on the 28th.   His updated medication list for this problem includes:    Metoprolol Tartrate 25 Mg Tabs (Metoprolol tartrate) .Marland Kitchen... Take 1 tablet by mouth two times a day    Nitroquick 0.4 Mg Subl (Nitroglycerin) .Marland Kitchen... Place 1 tablet under tongue as needed    Warfarin Sodium 5 Mg Tabs (Warfarin sodium) ..... Use as directed by anticoagulation clinic    Effient 10 Mg Tabs (Prasugrel hcl) .Marland Kitchen... Take one tablet  by mouth daily    Aspirin 81 Mg Tbec (Aspirin) .Marland Kitchen... Take one tablet by mouth daily. on hold  Orders: EKG w/ Interpretation (93000) TLB-CBC Platelet - w/Differential (85025-CBCD)  Problem # 3:  RECTAL BLEEDING (ICD-569.3) improved  Problem # 4:  DIABETES MELLITUS-TYPE II (ICD-250.00) not ideal control.  Needs weight loss. His updated medication list for this problem includes:    Levemir Flexpen 100 Unit/ml Soln (Insulin detemir) .Marland KitchenMarland KitchenMarland KitchenMarland Kitchen 60units subcutaneously two times a day or as directed    Benicar 40 Mg Tabs (Olmesartan medoxomil) .Marland Kitchen... 1 by mouth once daily    Novolin N 100 Unit/ml Susp (Insulin isophane human) .Marland KitchenMarland KitchenMarland KitchenMarland Kitchen 30 units up to three times a day    Glucophage 1000 Mg Tabs (Metformin hcl) .Marland Kitchen... 1/2 tablet two times a day    Aspirin 81 Mg Tbec (Aspirin) .Marland Kitchen... Take one tablet by mouth daily. on hold  Patient Instructions: 1)  Your physician recommends that you keep your scheduled follow-up appointment with Dr Riley Kill.  2)  Your physician recommends that you have lab work today: CBC 3)  You are scheduled to see Dr Narda Bonds on 09/09/10 at 3:15

## 2010-10-28 NOTE — Medication Information (Signed)
Summary: rov/sp  Anticoagulant Therapy  Managed by: Windell Hummingbird, RN Referring MD: Shawnie Pons, MD PCP: Birdie Sons, MD  Supervising MD: Graciela Husbands MD, Viviann Spare Indication 1: v58.83,v58.61,415.19 Indication 2: Factor V def Lab Used: [facility] Site: [address] INR POC 2.7 INR RANGE 2.5-3.5  Dietary changes: no    Health status changes: no    Bleeding/hemorrhagic complications: no    Recent/future hospitalizations: no    Any changes in medication regimen? no    Recent/future dental: no  Any missed doses?: no       Is patient compliant with meds? yes       Allergies: 1)  ! Vioxx 2)  Feldene (Piroxicam)  Anticoagulation Management History:      Positive risk factors for bleeding include presence of serious comorbidities.  Negative risk factors for bleeding include an age less than 59 years old and no history of CVA/TIA.  The bleeding index is 'intermediate risk'.  Positive CHADS2 values include History of HTN and History of Diabetes.  Negative CHADS2 values include Age > 28 years old and Prior Stroke/CVA/TIA.  His last INR was 2.6.  Anticoagulation responsible provider: Graciela Husbands MD, Viviann Spare.  INR POC: 2.7.  Cuvette Lot#: [id number].  Exp: 09/2011.    Anticoagulation Management Assessment/Plan:      The patient's current anticoagulation dose is Warfarin sodium 5 mg tabs: Use as directed by Anticoagulation Clinic.  The target INR is 2.5-3.5.  The next INR is due 11/16/2010.  Anticoagulation instructions were given to patient.  Results were reviewed/authorized by Windell Hummingbird, RN.  He was notified by Windell Hummingbird, RN.         Prior Anticoagulation Instructions: INR 2.9  Restart previous dose of 1 1/2 tablets every day except 1 tablet on Tuesday, Thursday and Saturday.  Recheck INR in   Current Anticoagulation Instructions: INR 2.7  Continue with 1.5 tablets every day except 1 tablet on Tuesday, Thursday, and Saturday. Recheck in 4 weeks.

## 2010-10-28 NOTE — Miscellaneous (Signed)
Summary: [facility]  [facility]   Imported By: Maryln Gottron 10/13/2010 09:12:05  _____________________________________________________________________  External Attachment:    Type:   Image     Comment:   External Document

## 2010-10-28 NOTE — Assessment & Plan Note (Signed)
Summary: SINUS PROBLEMS // RS   Vital Signs:  Patient profile:   60 year old male Temp:     98.7 degrees F oral BP sitting:   118 / 80  (left arm) Cuff size:   large  Vitals Entered By: Sid Falcon LPN (November 05, 2009 2:36 PM) CC: sinus drainage, ear pressure, headache X 5 days   History of Present Illness: Acute visit. Approximately one week history of intermittent headaches, frontal and maxillary sinus pressure and pain bilaterally and some bloody thick yellowish nasal discharge. Increased fatigue. Occasional dry cough. No significant sore throat. Bilateral ear pressure. Nasal saline over-the-counter without improvement  Allergies: 1)  Feldene (Piroxicam)  Past History:  Past Medical History: Last updated: 02/27/2009 RENAL ARTERY STENOSIS (ICD-440.1) CORONARY ARTERY DISEASE (ICD-414.00) HYPERLIPIDEMIA (ICD-272.4) HYPERTENSION (ICD-401.9) OBESITY-MORBID (>100') (ICD-278.01) PSORIASIS (ICD-696.1) COLONIC POLYPS, ADENOMATOUS (ICD-211.3) ESOPHAGEAL STRICTURE (ICD-530.3) DUODENITIS, WITHOUT HEMORRHAGE (ICD-535.60) SLEEP APNEA, OBSTRUCTIVE (ICD-327.23) DIABETES MELLITUS, TYPE II, UNCONTROLLED (ICD-250.02) GERD (ICD-530.81) Pulmonary embolism, hx of Nephrolithiasis, hx of Depression  Past Surgical History: Last updated: 08/15/2008 Colonoscopy-03/06/2007 EGD-10/03/2002 RCA Stent x 2 Knee arthroscopy Angioplasty - 2005 Knee Arthroscopy- 2007 PTCA/stent---2009  Review of Systems      See HPI  Physical Exam  General:  Well-developed,well-nourished,in no acute distress; alert,appropriate and cooperative throughout examination Ears:  External ear exam shows no significant lesions or deformities.  Otoscopic examination reveals clear canals, tympanic membranes are intact bilaterally without bulging, retraction, inflammation or discharge. Hearing is grossly normal bilaterally. Nose:  patient has thick yellow mostly dried mucous right naris greater than left. Some dried  blood noted in both. No active nasal bleeding Mouth:  Oral mucosa and oropharynx without lesions or exudates.  Teeth in good repair. Neck:  No deformities, masses, or tenderness noted. Lungs:  Normal respiratory effort, chest expands symmetrically. Lungs are clear to auscultation, no crackles or wheezes. Heart:  normal rate and regular rhythm.     Impression & Recommendations:  Problem # 1:  SINUSITIS, ACUTE (ICD-461.9)  His updated medication list for this problem includes:    Metronidazole 250 Mg Tabs (Metronidazole) ..... One three times a day for 5 days    Amoxicillin 875 Mg Tabs (Amoxicillin) ..... One by mouth two times a day for 10 days  Complete Medication List: 1)  Cetirizine Hcl 10 Mg Tabs (Cetirizine hcl) .... Take 1 tablet by mouth once a day 2)  Metoprolol Tartrate 25 Mg Tabs (Metoprolol tartrate) .... Take 1 tablet by mouth two times a day 3)  Nitroquick 0.4 Mg Subl (Nitroglycerin) .... Place 1 tablet under tongue as needed 4)  Plavix 75 Mg Tabs (Clopidogrel bisulfate) .... Take 1 tablet by mouth once a day 5)  Levemir Flexpen 100 Unit/ml Soln (Insulin detemir) .... 55 units subcutaneously two times a day or as directed 6)  Crestor 20 Mg Tabs (Rosuvastatin calcium) .Marland Kitchen.. 1 by mouth once daily 7)  Benicar 40 Mg Tabs (Olmesartan medoxomil) .Marland Kitchen.. 1 by mouth once daily 8)  Freestyle Lite Test Strp (Glucose blood) .... Use two times a day as directed 9)  Fish Oil 1200 Mg Caps (Omega-3 fatty acids) .... Once daily 10)  Red Yeast Rice 600 Mg Caps (Red yeast rice extract) .... Once daily 11)  Novolin N 100 Unit/ml Susp (Insulin isophane human) .Marland Kitchen.. 18 units with meals 12)  Bd U/f Short Pen Needle 31g X 8 Mm Misc (Insulin pen needle) .... Use as directed 13)  Chlorthalidone 25 Mg Tabs (Chlorthalidone) .... Take one-half  tablet by mouth  daily 14)  Citalopram Hydrobromide 20 Mg Tabs (Citalopram hydrobromide) .... Take one tablet by mouth daily 15)  Trazodone Hcl 50 Mg Tabs (Trazodone  hcl) .... 1/2 - 1 by mouth at bedtime as needed insomnia 16)  Metformin Hcl 1000 Mg Tabs (Metformin hcl) .... Take 1 tablet by mouth two times a day 17)  Warfarin Sodium 5 Mg Tabs (Warfarin sodium) .... Take 1 tablet by mouth at bedtime 18)  Omeprazole 20 Mg Cpdr (Omeprazole) .... One by mouth daily 19)  Metronidazole 250 Mg Tabs (Metronidazole) .... One three times a day for 5 days 20)  Pro-flora Concentrate Caps (Probiotic product) .Marland Kitchen.. 1 tab three times a day with meals 21)  Amoxicillin 875 Mg Tabs (Amoxicillin) .... One by mouth two times a day for 10 days  Patient Instructions: 1)  Acute sinusitis symptoms for less than 10 days are not helped by antibiotics. Use warm moist compresses, and over the counter decongestants( only as directed). Call if no improvement in 5-7 days, sooner if increasing pain, fever, or new symptoms.  Prescriptions: AMOXICILLIN 875 MG TABS (AMOXICILLIN) one by mouth two times a day for 10 days  #20 x 0   Entered and Authorized by:   Evelena Peat MD   Signed by:   Evelena Peat MD on 11/05/2009   Method used:   Electronically to        [facility]  [address] [phone]* (retail)       [address]       [place], [place]  [address]       Ph: [phone]       Fax: [phone]   RxID:   [id number]

## 2010-10-28 NOTE — Miscellaneous (Signed)
Summary: [facility]iac Progress Note   [facility] Note   Imported By: Roderic Ovens 04/06/2010 15:07:51  _____________________________________________________________________  External Attachment:    Type:   Image     Comment:   External Document

## 2010-10-28 NOTE — Progress Notes (Signed)
Summary: triage  Phone Note From Other Clinic Call back at [phone]   Caller: Leotis Shames, nurse Call For: Dr. Leone Payor Reason for Call: Schedule Patient Appt Summary of Call: Dr. Riley Kill would like ptto be seen this afternoon for rectal bleeding... pt on Coumadin Initial call taken by: Vallarie Mare,  April 28, 2010 12:17 PM  Follow-up for Phone Call        patient will be seen today at 3:00 with Dr Leone Payor Follow-up by: Darcey Nora RN, CGRN,  April 28, 2010 1:34 PM

## 2010-10-28 NOTE — Miscellaneous (Signed)
Summary: [facility]   [facility] Progress Note   Imported By: Roderic Ovens 05/05/2010 12:30:00  _____________________________________________________________________  External Attachment:    Type:   Image     Comment:   External Document

## 2010-10-28 NOTE — Miscellaneous (Signed)
Clinical Lists Changes  Observations: Added new observation of CARDCATHFIND:  CONCLUSION:   1. Coronary artery disease status post previous percutaneous       interventions as described above with 30% narrowing in the proximal       left anterior descending, 30% narrowing in the proximal circumflex       artery with 0% stenosis in the stent site in the mid circumflex       artery, 95% stenosis in the mid-to-distal right coronary artery       just distal to the placement of the previous drug-eluting stents       with 30% narrowing in the distal edge of the previous placed       stents.   2. Successful percutaneous coronary intervention of the lesion in the       mid-to-distal right coronary artery using 2 overlapping Promus       stents with improvement in central narrowing from 95% to 0%.      DISPOSITION:  The patient returned to post angio room for further   observation.               Bruce Elvera Lennox Juanda Chance, MD, [facility]  (07/16/2010 9:09) Added new observation of NUCLEAR NOS: QPS  Raw Data Images:  Normal; no motion artifact; normal heart/lung ratio. Stress Images:  Markedly decreased uptake in the inferior wall. Rest Images:  Normal homogeneous uptake in all areas of the myocardium. Subtraction (SDS):  There is a significant reversibe defect throughout the entire inferior wall concerning for ischemia. Transient Ischemic Dilatation:  .70  (Normal <1.22)  Lung/Heart Ratio:  .32  (Normal <0.45)  Quantitative Gated Spect Images  QGS EDV:  73 ml QGS ESV:  29 ml QGS EF:  60 % QGS cine images:  Hypokinesis of the basilar to mid inferior and spetal walls.  Findings  Abnormal nuclear study      Overall Impression   Exercise Capacity: Fair exercise capacity. BP Response: Normal blood pressure response. Clinical Symptoms: Chest and jaw pain beginning in stage I of exercise and persisting into recovery. ECG Impression: No significant ST segment change suggestive of ischemia. Overall  Impression: Abnormal stress nuclear study. Overall Impression Comments: There is a significant reversibe defect throughout the entire inferior wall concerning for ischemia.    Signed by Dolores Patty, MD, [facility] on 07/13/2010 at 6:40 PM  ___________________________________________________________________ (07/13/2010 9:08) Added new observation of ECHOINTERP:   Study Conclusions   Left ventricle: The cavity size was normal. Wall thickness was   normal. Systolic function was normal. The estimated ejection   fraction was in the range of 55% to 60%. Doppler parameters are   consistent with abnormal left ventricular relaxation (grade 1   diastolic dysfunction).    Transthoracic echocardiography. M-mode,   complete 2D, spectral Doppler, and color Doppler. Height: Height:   180.3cm. Height: 71in. Weight: Weight: 113.4kg. Weight: 249.5lb.   Body mass index: BMI: 34.9kg/m^2. Body surface area: BSA: 2.89m^2.   Patient status: Outpatient. Location: [facility], site [facility] Echo Lab.   Prepared and Electronically Authenticated by    Dietrich Pates, MD (07/06/2009 9:10)      Nuclear Study  Procedure date:  07/13/2010  Findings:      QPS  Raw Data Images:  Normal; no motion artifact; normal heart/lung ratio. Stress Images:  Markedly decreased uptake in the inferior wall. Rest Images:  Normal homogeneous uptake in all areas of the myocardium. Subtraction (SDS):  There is a significant reversibe defect  throughout the entire inferior wall concerning for ischemia. Transient Ischemic Dilatation:  .70  (Normal <1.22)  Lung/Heart Ratio:  .32  (Normal <0.45)  Quantitative Gated Spect Images  QGS EDV:  73 ml QGS ESV:  29 ml QGS EF:  60 % QGS cine images:  Hypokinesis of the basilar to mid inferior and spetal walls.  Findings  Abnormal nuclear study      Overall Impression   Exercise Capacity: Fair exercise capacity. BP Response: Normal blood pressure response. Clinical Symptoms: Chest and jaw  pain beginning in stage I of exercise and persisting into recovery. ECG Impression: No significant ST segment change suggestive of ischemia. Overall Impression: Abnormal stress nuclear study. Overall Impression Comments: There is a significant reversibe defect throughout the entire inferior wall concerning for ischemia.    Signed by Dolores Patty, MD, [facility] on 07/13/2010 at 6:40 PM  ___________________________________________________________________  Cardiac Cath  Procedure date:  07/16/2010  Findings:       CONCLUSION:   1. Coronary artery disease status post previous percutaneous       interventions as described above with 30% narrowing in the proximal       left anterior descending, 30% narrowing in the proximal circumflex       artery with 0% stenosis in the stent site in the mid circumflex       artery, 95% stenosis in the mid-to-distal right coronary artery       just distal to the placement of the previous drug-eluting stents       with 30% narrowing in the distal edge of the previous placed       stents.   2. Successful percutaneous coronary intervention of the lesion in the       mid-to-distal right coronary artery using 2 overlapping Promus       stents with improvement in central narrowing from 95% to 0%.      DISPOSITION:  The patient returned to post angio room for further   observation.               Bruce Elvera Lennox Juanda Chance, MD, [facility]   Echocardiogram  Procedure date:  07/06/2009  Findings:        Study Conclusions   Left ventricle: The cavity size was normal. Wall thickness was   normal. Systolic function was normal. The estimated ejection   fraction was in the range of 55% to 60%. Doppler parameters are   consistent with abnormal left ventricular relaxation (grade 1   diastolic dysfunction).    Transthoracic echocardiography. M-mode,   complete 2D, spectral Doppler, and color Doppler. Height: Height:   180.3cm. Height: 71in. Weight: Weight: 113.4kg. Weight:  249.5lb.   Body mass index: BMI: 34.9kg/m^2. Body surface area: BSA: 2.28m^2.   Patient status: Outpatient. Location: [facility], site [facility] Echo Lab.   Prepared and Electronically Authenticated by    Dietrich Pates, MD

## 2010-10-28 NOTE — Progress Notes (Signed)
Summary: INR  Phone Note Call from Patient Call back at Work Phone [phone]   Caller: Patient Call For: Birdie Sons MD Summary of Call: INR:  4.2 Taking Odd 7.5mg . Even           5 mg. Yesterday he had 7.5 mg. [phone] Initial call taken by: Lynann Beaver CMA,  April 29, 2010 9:59 AM  Follow-up for Phone Call        per dr Lovell Sheehan- hold for 2 days then start 5,5,7.5 and repeat in 3 weeks Follow-up by: Willy Eddy, LPN,  April 29, 2010 10:29 AM  Additional Follow-up for Phone Call Additional follow up Details #1::        Pt notified. Additional Follow-up by: Lynann Beaver CMA,  April 29, 2010 10:36 AM

## 2010-10-28 NOTE — Progress Notes (Signed)
Summary: ASA  Phone Note Outgoing Call   Summary of Call: [name], [name] followed up today.  He had DES x 2 to the RCA recently.  His f/u P2Y12 was 34% (prevoius was 38%).  He had epistaxis and hemorrhoidal bleeding and cut back his ASA to 162 on his own a couple weeks ago.  His epistaxis resolved and his hemorrhoidal bleeding is better. He was told to continue ASA 325 at discharge because of his platelet inhibition.  His INR is thearpeutic.  He is taking Effient. I sent him for another P2Y12.   Can we cut him back to ASA 81 mg once daily now? Thanks Initial call taken by: Brynda Rim,  August 12, 2010 9:14 AM  Follow-up for Phone Call        Per Dr Riley Kill the pt can reduce ASA to 81mg  once a day.  Dr Riley Kill left a message for the pt to call back in regards to ASA dose.  Julieta Gutting, RN, BSN  August 23, 2010 5:51 PM  I spoke with the pt and made him aware of Dr Rosalyn Charters recommendation to decrease ASA to 81mg  once a day.  08/13/10 P2Y12 54% Follow-up by: Julieta Gutting, RN, BSN,  August 25, 2010 9:22 AM    New/Updated Medications: ASPIRIN 81 MG TBEC (ASPIRIN) Take one tablet by mouth daily

## 2010-10-28 NOTE — Assessment & Plan Note (Signed)
Summary: 4 MNTH ROV//SLM   Vital Signs:  Patient profile:   60 year old male Weight:      261 pounds Temp:     98.1 degrees F oral BP sitting:   108 / 78  (left arm) Cuff size:   large  Vitals Entered By: Kern Reap CMA Duncan Dull) (November 20, 2009 10:54 AM) CC: follow-up visit Is Patient Diabetic? Yes Did you bring your meter with you today? No   Primary Care Provider:  Birdie Sons MD  CC:  follow-up visit.  History of Present Illness:  Follow-Up Visit      This is a 60 year old man who presents for Follow-up visit.  The patient denies chest pain and palpitations.  Since the last visit the patient notes no new problems or concerns.  The patient reports taking meds as prescribed.  When questioned about possible medication side effects, the patient notes none.    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 Take 1 Tablet By Mouth Once A Day 2)  Metoprolol Tartrate 25 Mg Tabs (Metoprolol Tartrate) .... Take 1 Tablet By Mouth Two Times A Day 3)  Nitroquick 0.4 Mg Subl (Nitroglycerin) .... Place 1 Tablet Under Tongue As Needed 4)  Plavix 75 Mg Tabs (Clopidogrel Bisulfate) .... Take 1 Tablet By Mouth Once  A Day 5)  Levemir Flexpen 100 Unit/ml Soln (Insulin Detemir) .... 55 Units Subcutaneously Two Times A Day or As Directed 6)  Crestor 20 Mg  Tabs (Rosuvastatin Calcium) .Marland Kitchen.. 1 By Mouth Once Daily 7)  Benicar 40 Mg  Tabs (Olmesartan Medoxomil) .Marland Kitchen.. 1 By Mouth Once Daily 8)  Freestyle Lite Test  Strp (Glucose Blood) .... Use Two Times A Day As Directed 9)  Fish Oil 1200 Mg  Caps (Omega-3 Fatty Acids) .... Once Daily 10)  Red Yeast Rice 600 Mg  Caps (Red Yeast Rice Extract) .... Once Daily 11)  Novolin N 100 Unit/ml Susp (Insulin Isophane Human) .Marland Kitchen.. 18 Units With Meals 12)  Bd U/f Short Pen Needle 31g X 8 Mm Misc (Insulin Pen Needle) .... Use As Directed 13)  Chlorthalidone 25 Mg Tabs (Chlorthalidone) .... Take One-Half  Tablet By Mouth Daily 14)  Citalopram Hydrobromide 20 Mg Tabs (Citalopram Hydrobromide) .... Take One Tablet By Mouth Daily 15)  Trazodone Hcl 50 Mg Tabs (Trazodone Hcl) .... 1/2 - 1 By Mouth At Bedtime As Needed Insomnia 16)  Metformin Hcl 1000 Mg Tabs (Metformin Hcl) .... Take 1 Tablet By Mouth Two Times A Day 17)  Warfarin Sodium 5 Mg Tabs (Warfarin Sodium) .... Take 1 Tablet By Mouth At Bedtime 18)  Omeprazole 20 Mg Cpdr (Omeprazole) .... One By Mouth Daily  Allergies (verified): 1)  Feldene (Piroxicam)  Past History:  Past Medical History:  Last updated: 02/27/2009 RENAL ARTERY STENOSIS (ICD-440.1) CORONARY ARTERY DISEASE (ICD-414.00) HYPERLIPIDEMIA (ICD-272.4) HYPERTENSION (ICD-401.9) OBESITY-MORBID (>100') (ICD-278.01) PSORIASIS (ICD-696.1) COLONIC POLYPS, ADENOMATOUS (ICD-211.3) ESOPHAGEAL STRICTURE (ICD-530.3) DUODENITIS, WITHOUT HEMORRHAGE (ICD-535.60) SLEEP APNEA, OBSTRUCTIVE (ICD-327.23) DIABETES MELLITUS, TYPE II, UNCONTROLLED (ICD-250.02) GERD (ICD-530.81) Pulmonary embolism, hx of Nephrolithiasis, hx of Depression  Past Surgical History: Last updated: 08/15/2008 Colonoscopy-03/06/2007 EGD-10/03/2002 RCA Stent x 2 Knee arthroscopy Angioplasty -  2005 Knee Arthroscopy- 2007 PTCA/stent---2009  Family History: Last updated: 07/14/2009 Family History Diabetes 1st degree relative brother deceased-dm, htn, chf Family History of Coronary Artery Disease:  mother pulmonary embolus 2010 (factor V deficiency)  Social History: Last updated: 01/16/2009 Married Never Smoked Full Time Alcohol Use - no Drug Use - no  Risk Factors: Exercise: no (06/08/2007)  Risk Factors: Smoking Status: never (10/15/2009)  Review of Systems       All other systems reviewed and were negative   Physical Exam  General:  Well-developed,well-nourished,in no acute distress; alert,appropriate and cooperative throughout examination Head:  normocephalic and atraumatic.   Eyes:  pupils equal and pupils round.   Ears:  R ear normal and L ear normal.   Abdomen:  soft and non-tender.   obese Msk:  No deformity or scoliosis noted of thoracic or lumbar spine.   Neurologic:  cranial nerves II-XII intact and gait normal.   Skin:  turgor normal and color normal.   Psych:  memory intact for recent and remote and not anxious appearing.    Diabetes Management Exam:    Eye Exam:       Eye Exam done elsewhere          Date: 10/27/2009          Results: normal--pt's report          Done by: ophthalm   Impression & Recommendations:  Problem # 1:  PULMONARY EMBOLISM (ICD-415.19) lifelong warfarin His updated medication list for this problem includes:    Plavix 75 Mg Tabs (Clopidogrel bisulfate) .Marland Kitchen... Take 1 tablet by mouth once a day    Warfarin Sodium 5 Mg Tabs (Warfarin sodium) .Marland Kitchen... Take 1 tablet by mouth at bedtime  Reviewed the following: PT: 17.3 (10/15/2009)   INR: 2.0 (10/15/2009)    Next Protime: 1 month (dated on 09/11/2009)  Orders: Protime ([id number])  Problem # 2:  DEPRESSION (ICD-311) doing well His updated medication list for this problem includes:    Citalopram Hydrobromide 20 Mg Tabs (Citalopram hydrobromide) .Marland Kitchen... Take one tablet by  mouth daily    Trazodone Hcl 50 Mg Tabs (Trazodone hcl) .Marland Kitchen... 1/2 - 1 by mouth at bedtime as needed insomnia  Problem # 3:  HYPERTENSION (ICD-401.9) reasonable control on no meds BP today: 108/78 Prior BP: 118/80 (11/05/2009)  Prior 10 Yr Risk Heart Disease: N/A (04/26/2007)  Labs Reviewed: K+: 4.8 (11/12/2009) Creat: : 1.0 (11/12/2009)   Chol: 151 (11/12/2009)   HDL: 35.70 (11/12/2009)   LDL: DEL (11/17/2008)   TG: 385.0 (11/12/2009)  Problem # 4:  FACTOR V DEFICIENCY (ICD-286.3) lefelong warfarin Orders: Protime (62130QM)  Problem # 5:  OBESITY-MORBID (>100') (ICD-278.01)  clearly this is his main problem discussed need for drastic weight loss  Ht: 71 (06/23/2009)   Wt: 261 (11/20/2009)   BMI: 36.25 (06/23/2009)  Problem # 6:  ABDOMINAL PAIN (ICD-789.00) recurrent abdominal discomfort I wonder about metformin---see ne w dose of metformin and patient instructions  Problem # 7:  DIABETES MELLITUS, TYPE II, UNCONTROLLED (ICD-250.02)  His updated medication list for this problem includes:  Levemir Flexpen 100 Unit/ml Soln (Insulin detemir) .Marland KitchenMarland KitchenMarland KitchenMarland Kitchen 55 units subcutaneously two times a day or as directed    Benicar 40 Mg Tabs (Olmesartan medoxomil) .Marland Kitchen... 1 by mouth once daily    Novolin N 100 Unit/ml Susp (Insulin isophane human) .Marland Kitchen... 20 units with meals    Metformin Hcl 1000 Mg Tabs (Metformin hcl) .Marland Kitchen... Take 1/2  tablet by mouth two times a day  Labs Reviewed: Creat: 1.0 (11/12/2009)     Last Eye Exam: normal--pt's report (10/27/2009) Reviewed HgBA1c results: 9.3 (11/12/2009)  10.0 (07/09/2009)  Complete Medication List: 1)  Cetirizine Hcl 10 Mg Tabs (Cetirizine hcl) .... Take 1 tablet by mouth once a day 2)  Metoprolol Tartrate 25 Mg Tabs (Metoprolol tartrate) .... Take 1 tablet by mouth two times a day 3)  Nitroquick 0.4 Mg Subl (Nitroglycerin) .... Place 1 tablet under tongue as needed 4)  Plavix 75 Mg Tabs (Clopidogrel bisulfate) .... Take 1 tablet by mouth once a  day 5)  Levemir Flexpen 100 Unit/ml Soln (Insulin detemir) .... 55 units subcutaneously two times a day or as directed 6)  Crestor 20 Mg Tabs (Rosuvastatin calcium) .Marland Kitchen.. 1 by mouth once daily 7)  Benicar 40 Mg Tabs (Olmesartan medoxomil) .Marland Kitchen.. 1 by mouth once daily 8)  Freestyle Lite Test Strp (Glucose blood) .... Use two times a day as directed 9)  Fish Oil 1200 Mg Caps (Omega-3 fatty acids) .... Once daily 10)  Red Yeast Rice 600 Mg Caps (Red yeast rice extract) .... Once daily 11)  Novolin N 100 Unit/ml Susp (Insulin isophane human) .... 20 units with meals 12)  Bd U/f Short Pen Needle 31g X 8 Mm Misc (Insulin pen needle) .... Use as directed 13)  Chlorthalidone 25 Mg Tabs (Chlorthalidone) .... Take one-half  tablet by mouth daily 14)  Citalopram Hydrobromide 20 Mg Tabs (Citalopram hydrobromide) .... Take one tablet by mouth daily 15)  Trazodone Hcl 50 Mg Tabs (Trazodone hcl) .... 1/2 - 1 by mouth at bedtime as needed insomnia 16)  Metformin Hcl 1000 Mg Tabs (Metformin hcl) .... Take 1/2  tablet by mouth two times a day 17)  Warfarin Sodium 5 Mg Tabs (Warfarin sodium) .... Take 1 tablet by mouth at bedtime 18)  Omeprazole 20 Mg Cpdr (Omeprazole) .... One by mouth daily  Patient Instructions: 1)  see me one month 2)  new dose metfromin---if GI symptoms persist for 2 weeks then stop metformin 3)  See your eye doctor yearly to check for diabetic eye damage.  Laboratory Results   Blood Tests   Date/Time Recieved: November 20, 2009 11:01 AM  Date/Time Reported: November 20, 2009 11:01 AM   PT: 17.9 s   (Normal Range: 10.6-13.4)  INR: 2.1   (Normal Range: 0.88-1.12   Therap INR: 2.0-3.5) Comments: Wynona Canes, CMA  November 20, 2009 11:01 AM       ANTICOAGULATION RECORD PREVIOUS REGIMEN & LAB RESULTS   Previous INR:  2.0 on  10/15/2009  Previous Regimen:  same on  10/15/2009  NEW REGIMEN & LAB RESULTS Current INR Goal Range: 2.5-3.5 Current INR: 2.1 Current Coumadin  Dose(mg): 7.5mg ,5mg ,5mg  alt. Regimen: same       Repeat testing in: 1 month MEDICATIONS CETIRIZINE HCL 10 MG TABS (CETIRIZINE HCL) Take 1 tablet by mouth once a day METOPROLOL TARTRATE 25 MG TABS (METOPROLOL TARTRATE) Take 1 tablet by mouth two times a day NITROQUICK 0.4 MG SUBL (NITROGLYCERIN) Place 1 tablet under tongue as needed PLAVIX 75 MG TABS (CLOPIDOGREL  BISULFATE) Take 1 tablet by mouth once a day LEVEMIR FLEXPEN 100 UNIT/ML SOLN (INSULIN DETEMIR) 55 units subcutaneously two times a day or as directed CRESTOR 20 MG  TABS (ROSUVASTATIN CALCIUM) 1 by mouth once daily BENICAR 40 MG  TABS (OLMESARTAN MEDOXOMIL) 1 by mouth once daily FREESTYLE LITE TEST  STRP (GLUCOSE BLOOD) use two times a day as directed FISH OIL 1200 MG  CAPS (OMEGA-3 FATTY ACIDS) once daily RED YEAST RICE 600 MG  CAPS (RED YEAST RICE EXTRACT) once daily NOVOLIN N 100 UNIT/ML SUSP (INSULIN ISOPHANE HUMAN) 20 Units with meals BD U/F SHORT PEN NEEDLE 31G X 8 MM MISC (INSULIN PEN NEEDLE) use as directed CHLORTHALIDONE 25 MG TABS (CHLORTHALIDONE) Take one-half  tablet by mouth daily CITALOPRAM HYDROBROMIDE 20 MG TABS (CITALOPRAM HYDROBROMIDE) take one tablet by mouth daily TRAZODONE HCL 50 MG TABS (TRAZODONE HCL) 1/2 - 1 by mouth at bedtime as needed insomnia METFORMIN HCL 1000 MG TABS (METFORMIN HCL) Take 1/2  tablet by mouth two times a day WARFARIN SODIUM 5 MG TABS (WARFARIN SODIUM) Take 1 tablet by mouth at bedtime OMEPRAZOLE 20 MG CPDR (OMEPRAZOLE) one by mouth daily   Anticoagulation Visit Questionnaire      Coumadin dose missed/changed:  No      Abnormal Bleeding Symptoms:  No   Any diet changes including alcohol intake, vegetables or greens since the last visit:  No Any illnesses or hospitalizations since the last visit:  No Any signs of clotting since the last visit (including chest discomfort, dizziness, shortness of breath, arm tingling, slurred speech, swelling or redness in leg):  No  Laboratory  Results   Blood Tests     PT: 17.9 s   (Normal Range: 10.6-13.4)  INR: 2.1   (Normal Range: 0.88-1.12   Therap INR: 2.0-3.5) Comments: Wynona Canes, CMA  November 20, 2009 11:01 AM       ANTICOAGULATION RECORD PREVIOUS REGIMEN & LAB RESULTS   Previous INR:  2.0 on  10/15/2009  Previous Regimen:  same on  10/15/2009  NEW REGIMEN & LAB RESULTS Current INR Goal Range: 2.5-3.5 Current INR: 2.1 Current Coumadin Dose(mg): 7.5mg ,5mg ,5mg  alt. Regimen: same  Repeat testing in: 1 month  Anticoagulation Visit Questionnaire Coumadin dose missed/changed:  No Abnormal Bleeding Symptoms:  No   MEDICATIONS CETIRIZINE HCL 10 MG TABS (CETIRIZINE HCL) Take 1 tablet by mouth once a day METOPROLOL TARTRATE 25 MG TABS (METOPROLOL TARTRATE) Take 1 tablet by mouth two times a day NITROQUICK 0.4 MG SUBL (NITROGLYCERIN) Place 1 tablet under tongue as needed PLAVIX 75 MG TABS (CLOPIDOGREL BISULFATE) Take 1 tablet by mouth once a day LEVEMIR FLEXPEN 100 UNIT/ML SOLN (INSULIN DETEMIR) 55 units subcutaneously two times a day or as directed CRESTOR 20 MG  TABS (ROSUVASTATIN CALCIUM) 1 by mouth once daily BENICAR 40 MG  TABS (OLMESARTAN MEDOXOMIL) 1 by mouth once daily FREESTYLE LITE TEST  STRP (GLUCOSE BLOOD) use two times a day as directed FISH OIL 1200 MG  CAPS (OMEGA-3 FATTY ACIDS) once daily RED YEAST RICE 600 MG  CAPS (RED YEAST RICE EXTRACT) once daily NOVOLIN N 100 UNIT/ML SUSP (INSULIN ISOPHANE HUMAN) 20 Units with meals BD U/F SHORT PEN NEEDLE 31G X 8 MM MISC (INSULIN PEN NEEDLE) use as directed CHLORTHALIDONE 25 MG TABS (CHLORTHALIDONE) Take one-half  tablet by mouth daily CITALOPRAM HYDROBROMIDE 20 MG TABS (CITALOPRAM HYDROBROMIDE) take one tablet by mouth daily TRAZODONE HCL 50 MG TABS (TRAZODONE HCL) 1/2 - 1 by mouth at bedtime as needed insomnia  METFORMIN HCL 1000 MG TABS (METFORMIN HCL) Take 1/2  tablet by mouth two times a day WARFARIN SODIUM 5 MG TABS (WARFARIN SODIUM) Take  1 tablet by mouth at bedtime OMEPRAZOLE 20 MG CPDR (OMEPRAZOLE) one by mouth daily

## 2010-10-28 NOTE — Miscellaneous (Signed)
Summary: Appointment Canceled  Appointment status changed to canceled by [employer] on 03/22/2010 4:01 PM.  Cancellation Comments --------------------- echo/per check out at last office visit/lg  Appointment Information ----------------------- Appt Type:  CARDIOLOGY ANCILLARY VISIT      Date:  Monday, May 24, 2010      Time:  1:00 PM for 60 min   Urgency:  Routine   Made By:  Pearson Grippe  To Visit:  [id number]    Reason:  echo/per check out at last office visit/lg  Appt Comments ------------- -- 03/22/10 16:01: (CEMR) CANCELED -- echo/per check out at last office visit/lg -- 03/22/10 15:08: (CEMR) BOOKED -- Routine CARDIOLOGY ANCILLARY VISIT at 05/24/2010 1:00 PM for 60 min echo/per check out at last office visit/lg

## 2010-10-28 NOTE — Miscellaneous (Signed)
Summary: [facility] Progress Note   [facility] Progress Note   Imported By: Roderic Ovens 09/28/2010 11:40:39  _____________________________________________________________________  External Attachment:    Type:   Image     Comment:   External Document

## 2010-10-28 NOTE — Assessment & Plan Note (Signed)
Summary: RECTAL BLEEDING, HEMORRHOIDS...AS.   History of Present Illness Visit Type: Follow-up Visit Primary GI MD: Stan Head MD [facility] Primary Provider: Birdie Sons, MD  Requesting Provider: Shawnie Pons, MD  Chief Complaint: rectal bleeding/hemorrhoids, c/o diarrhea and belching History of Present Illness:   Intermittent spells of severe diarrhea and belching. Had some rectal bleeding that has resolved. Metformin was stopped and Align was started about 2 months ago. One spell since. Spells last for 2-4 days. Starts with severe burping at night. Diarrhea at night usually, no incontinence. This began a little over a year ago. Stool studies are negative. No new medicines except insulin increase and switching Nexium to omeprazole. he has not tried anti-diarrheals he has had metronidazole twice and thinks it helped but has had spontaneous resolution of diarrhea also. Blood sugars are a little high since stopping Metformin.   GI Review of Systems    Reports belching.      Denies abdominal pain, acid reflux, bloating, chest pain, dysphagia with liquids, dysphagia with solids, heartburn, loss of appetite, nausea, vomiting, vomiting blood, weight loss, and  weight gain.      Reports diarrhea, hemorrhoids, and  rectal bleeding.     Denies anal fissure, black tarry stools, change in bowel habit, constipation, diverticulosis, fecal incontinence, heme positive stool, irritable bowel syndrome, jaundice, light color stool, liver problems, and  rectal pain. Clinical Reports Reviewed:  Colonoscopy:  03/06/2007:  Done  03/06/2007:  Results: Hemorrhoids.     Location:  [facility].   03/06/2007:  Results: Hemorrhoids.          Current Medications (verified): 1)  Cetirizine Hcl 10 Mg Tabs (Cetirizine Hcl) .... Take 1 Tablet By Mouth Once A Day 2)  Metoprolol Tartrate 25 Mg Tabs (Metoprolol Tartrate) .... Take 1 Tablet By Mouth Two Times A Day 3)  Nitroquick 0.4 Mg Subl  (Nitroglycerin) .... Place 1 Tablet Under Tongue As Needed 4)  Plavix 75 Mg Tabs (Clopidogrel Bisulfate) .... Take 1 Tablet By Mouth Once A Day 5)  Levemir Flexpen 100 Unit/ml Soln (Insulin Detemir) .... 60units Subcutaneously Two Times A Day or As Directed 6)  Crestor 20 Mg  Tabs (Rosuvastatin Calcium) .Marland Kitchen.. 1 By Mouth Once Daily 7)  Benicar 40 Mg  Tabs (Olmesartan Medoxomil) .Marland Kitchen.. 1 By Mouth Once Daily 8)  Freestyle Lite Test  Strp (Glucose Blood) .... Use Two To Three Times A Day As Directed 9)  Novolin N 100 Unit/ml Susp (Insulin Isophane Human) .... 30 Units With Meals and At Bedtime 10)  Bd U/f Short Pen Needle 31g X 8 Mm Misc (Insulin Pen Needle) .... Use As Directed 11)  Chlorthalidone 25 Mg Tabs (Chlorthalidone) .... Take One-Half  Tablet By Mouth Daily 12)  Citalopram Hydrobromide 20 Mg Tabs (Citalopram Hydrobromide) .... Take One Tablet By Mouth Daily 13)  Trazodone Hcl 50 Mg Tabs (Trazodone Hcl) .... 1/2 - 1 By Mouth At Bedtime As Needed Insomnia 14)  Warfarin Sodium 5 Mg Tabs (Warfarin Sodium) .... Use As Directed By Anticoagulation Clinic 15)  Omeprazole 20 Mg Cpdr (Omeprazole) .... One By Mouth Daily 16)  Urocrit .... Take 1 Tablet By Mouth Three Times A Day With Meals 17)  Fish Oil 1000 Mg Caps (Omega-3 Fatty Acids) .... 3 Once Daily 18)  Align   Caps (Misc Intestinal Flora Regulat) .... Take One Capsule By Mouth Daily 19)  Glucophage 1000 Mg Tabs (Metformin Hcl) .... 1/2 Tablet Two Times A Day--Hold 20)  Anusol-Hc 25 Mg Supp (Hydrocortisone Acetate) .Marland KitchenMarland KitchenMarland Kitchen  1 Per Rectum Nightly For 1 Week Then As Needed For Bleeding Hemorrhoids 21)  Novolin N 100 Unit/ml Susp (Insulin Isophane Human) .... Use As Directed With Meals and At Bedtime  Allergies (verified): 1)  Feldene (Piroxicam)  Past History:  Past Medical History: Reviewed history from 04/28/2010 and no changes required. RENAL ARTERY STENOSIS (ICD-440.1) CORONARY ARTERY DISEASE (ICD-414.00) HYPERLIPIDEMIA  (ICD-272.4) HYPERTENSION (ICD-401.9) OBESITY-MORBID (>100') (ICD-278.01) PSORIASIS (ICD-696.1) ESOPHAGEAL STRICTURE (ICD-530.3) DUODENITIS, WITHOUT HEMORRHAGE (ICD-535.60) SLEEP APNEA, OBSTRUCTIVE (ICD-327.23) DIABETES MELLITUS, TYPE II, UNCONTROLLED (ICD-250.02) GERD (ICD-530.81) Pulmonary embolism, hx of Nephrolithiasis, hx of Depression Factor V Leiden deficiency  Past Surgical History: Reviewed history from 04/28/2010 and no changes required. Colonoscopy-03/06/2007 EGD-10/03/2002 RCA Stent x 2 Knee arthroscopy Angioplasty - 2005 & 2011 Knee Arthroscopy- 2007 PTCA/stent---2009  Vital Signs:  Patient profile:   60 year old male Height:      71 inches Weight:      267.50 pounds BMI:     37.44 Pulse rate:   92 / minute Pulse rhythm:   regular BP sitting:   100 / 64  (left arm)  Vitals Entered By: Milford Cage NCMA (June 25, 2010 8:34 AM)  Physical Exam  General:  Well developed, well nourished, in no acute distress. Obese Eyes:  anicteric Lungs:  Clear throughout to auscultation. Heart:  Regular rate and rhythm; no murmurs, rubs,  or bruits. Abdomen:  Bowel sounds positive,abdomen soft and non-tender without masses, organomegaly.daistasis recti/hernia in epigastrium ? small ubilical hernia Extremities:  No clubbing or cyanosis. trace LE edema   Impression & Recommendations:  Problem # 1:  DIARRHEA (ICD-787.91) Assessment New episodic x 1 year None in 5-6 weeks stool studies negative (C diff, Cx, Giarda/Crypto, O&P) has been on fish oil x years so do not suspect that metfomin would probably cause diarrhea daily, I would think but maybe not and was appropriate to hold at risk for small bowel bacterial overgrowth 1) resume metformin  2) continue Align 3) metronidazole on shelf to take if he recurs 4) call me if it recurs - may need cyclic abx or other investigation  Problem # 2:  HEMORRHOIDS, WITH BLEEDING (ICD-455.8) Assessment:  Improved resolved  Problem # 3:  ANEMIA-UNSPECIFIED (ICD-285.9) Assessment: Unchanged at risk for B12 deficiency on metformin, probably chronic disease but will reassess and evaluate as below  Orders: TLB-CBC Platelet - w/Differential (85025-[id number]) TLB-B12 + Folate Pnl ([id number]/[id number]) TLB-Ferritin (82728-FER) TLB-IBC Pnl (Iron/FE;Transferrin) ([id number])  Patient Instructions: 1)  Please go to the basement to have your lab tests drawn today.  2)  We will call you with further follow up after reviewing these results. 3)  Please pick up your medications at your pharmacy. FLAGYL 4)  It is OK for you to restart your Metformin. 5)  Copy sent to : Birdie Sons, MD; Shawnie Pons, MD 6)  The medication list was reviewed and reconciled.  All changed / newly prescribed medications were explained.  A complete medication list was provided to the patient / caregiver. Prescriptions: METRONIDAZOLE 250 MG TABS (METRONIDAZOLE) 1 by mouth three times a day x 10 days  #30 x 0   Entered and Authorized by:   Iva Boop MD, [facility]   Signed by:   Iva Boop MD, FACG on 06/25/2010   Method used:   Electronically to        [facility]  [address] [id number] (retail)       [address] [facility]  [place], [place]  [address]       Ph: [phone]       Fax: [phone]   RxID:   [id number]

## 2010-10-28 NOTE — Assessment & Plan Note (Signed)
Summary: 4 MONTH ROV/NJR   Vital Signs:  Patient profile:   60 year old male Weight:      262 pounds Temp:     99.2 degrees F oral Pulse rate:   80 / minute Pulse rhythm:   regular BP sitting:   112 / 84  (left arm) Cuff size:   large  Vitals Entered By: Alfred Levins, CMA (September 10, 2010 11:31 AM) CC: f/u on labs   Primary Care Provider:  Birdie Sons, MD   CC:  f/u on labs.  History of Present Illness:  Follow-Up Visit      This is a 60 year old man who presents for Follow-up visit.  The patient denies chest pain and palpitations.  Since the last visit the patient notes a recent hospitilization and being seen by a specialist.  The patient reports taking meds as prescribed.  When questioned about possible medication side effects, the patient notes none.  has had some epistaxis---much improved  All other systems reviewed and were negative   Current Medications (verified): 1)  Cetirizine Hcl 10 Mg Tabs (Cetirizine Hcl) .... Take 1 Tablet By Mouth Once A Day 2)  Metoprolol Tartrate 25 Mg Tabs (Metoprolol Tartrate) .... Take 1 Tablet By Mouth Two Times A Day 3)  Nitroquick 0.4 Mg Subl (Nitroglycerin) .... Place 1 Tablet Under Tongue As Needed 4)  Levemir Flexpen 100 Unit/ml Soln (Insulin Detemir) .... 60units Subcutaneously Two Times A Day or As Directed 5)  Crestor 20 Mg  Tabs (Rosuvastatin Calcium) .Marland Kitchen.. 1 By Mouth Once Daily 6)  Benicar 40 Mg  Tabs (Olmesartan Medoxomil) .Marland Kitchen.. 1 By Mouth Once Daily 7)  Freestyle Lite Test  Strp (Glucose Blood) .... Use Two To Three Times A Day As Directed 8)  Novolin N 100 Unit/ml Susp (Insulin Isophane Human) .... 30 Units Up To Three Times A Day 9)  Bd U/f Short Pen Needle 31g X 8 Mm Misc (Insulin Pen Needle) .... Use As Directed 10)  Chlorthalidone 25 Mg Tabs (Chlorthalidone) .... Take One-Half  Tablet By Mouth Daily 11)  Citalopram Hydrobromide 20 Mg Tabs (Citalopram Hydrobromide) .... Take One Tablet By Mouth Daily 12)  Trazodone Hcl 50  Mg Tabs (Trazodone Hcl) .... 1/2 - 1 By Mouth At Bedtime As Needed Insomnia 13)  Warfarin Sodium 5 Mg Tabs (Warfarin Sodium) .... Use As Directed By Anticoagulation Clinic 14)  Urocrit .... Take 1 Tablet By Mouth Three Times A Day With Meals 15)  Fish Oil 1000 Mg Caps (Omega-3 Fatty Acids) .... 3 Once Daily 16)  Align   Caps (Misc Intestinal Flora Regulat) .... Take One Capsule By Mouth Daily 17)  Glucophage 1000 Mg Tabs (Metformin Hcl) .... 1/2 Tablet Two Times A Day 18)  Effient 10 Mg Tabs (Prasugrel Hcl) .... Take One Tablet By Mouth Daily 19)  Aspirin 81 Mg Tbec (Aspirin) .... Take One Tablet By Mouth Daily. On Hold 20)  Anusol-Hc 25 Mg Supp (Hydrocortisone Acetate) .... Use 1 Suppository At Bedtime X 7 Days Then As Needed 21)  Miralax  Powd (Polyethylene Glycol 3350) .... Take 1 Dose 17 Gram in A Glass of Water Every Other Day  Allergies (verified): 1)  ! Vioxx 2)  Feldene (Piroxicam)  Past History:  Past Medical History: Last updated: 09/01/2010 RENAL ARTERY STENOSIS (ICD-440.1) CORONARY ARTERY DISEASE (ICD-414.00)     a.  s/p DES to RCA x 2 in 2005    b.  s/p DES to CFX 2009    c.  s/p PTCA of RCA for in stent restenosis 03/2010    d.  s/p Promus DES x 2 RCA 07/16/2010(recurrent pain with abnormal Myoview July 13, 2010)          --residual at cath 07/16/2010:  LAD 30%; CFX 30%; RCA 30% ISR HYPERLIPIDEMIA (ICD-272.4) HYPERTENSION (ICD-401.9) OBESITY-MORBID (>100') (ICD-278.01) PSORIASIS (ICD-696.1) ESOPHAGEAL STRICTURE (ICD-530.3) DUODENITIS, WITHOUT HEMORRHAGE (ICD-535.60) SLEEP APNEA, OBSTRUCTIVE (ICD-327.23) DIABETES MELLITUS, TYPE II, UNCONTROLLED (ICD-250.02) GERD (ICD-530.81) Pulmonary embolism, hx of Nephrolithiasis, hx of Depression Factor V Leiden deficiency INT. 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  Alcohol Use - no Daily Caffeine Use: one daily  Illicit Drug Use - no  Risk Factors: Exercise: no (06/08/2007)  Risk Factors: Smoking Status: never (05/20/2010)  Physical Exam  General:  alert and well-developed.   Head:  normocephalic and atraumatic.   Eyes:  pupils equal and pupils round.   Ears:  R ear normal and L ear normal.   Lungs:  normal respiratory effort and no intercostal retractions.   Heart:  normal rate and regular rhythm.   Abdomen:  soft and non-tender.   Skin:  turgor normal, color normal, and no rashes.     Impression & Recommendations:  Problem # 1:  DIABETES MELLITUS-TYPE II (ICD-250.00) advised aggressive weight loss His updated medication list for this problem includes:    Levemir Flexpen 100 Unit/ml Soln (Insulin detemir) .Marland KitchenMarland KitchenMarland KitchenMarland Kitchen 60units subcutaneously two times a day or as directed    Benicar 40 Mg Tabs (Olmesartan medoxomil) .Marland Kitchen... 1 by mouth once daily    Novolin N 100 Unit/ml Susp (Insulin isophane human) .Marland KitchenMarland KitchenMarland KitchenMarland Kitchen 30 units up to three times a day    Glucophage 1000 Mg Tabs (Metformin hcl) .Marland Kitchen... 1/2 tablet two times a day    Aspirin 81 Mg Tbec (Aspirin) .Marland Kitchen... Take one tablet by mouth daily. on hold  Labs Reviewed: Creat: 0.9 (08/31/2010)     Last Eye Exam: normal--pt's report (10/27/2009) Reviewed HgBA1c results: 8.5 (08/31/2010)  8.8 (02/04/2010)  Problem # 2:  COUMADIN THERAPY (ICD-V58.61)  Orders: Protime (04540JW) Fingerstick (11914)  Problem # 3:  HYPERTENSION (ICD-401.9) controlled continue current medications  His updated medication list for this problem  includes:    Metoprolol Tartrate 25 Mg Tabs (Metoprolol tartrate) .Marland Kitchen... Take 1 tablet by mouth two times a day    Benicar 40 Mg Tabs (Olmesartan medoxomil) .Marland Kitchen... 1 by mouth once daily    Chlorthalidone 25 Mg Tabs (Chlorthalidone) .Marland Kitchen... Take one-half  tablet by mouth daily  BP today: 112/84 Prior BP: 100/60 (09/08/2010)  Prior 10 Yr Risk Heart Disease: N/A (04/26/2007)  Labs Reviewed: K+: 4.4 (08/31/2010) Creat: : 0.9 (08/31/2010)   Chol: 170 (08/31/2010)   HDL: 25.20 (08/31/2010)   LDL: DEL (11/17/2008)   TG: 305.0 (08/31/2010)  Problem # 4:  CORONARY ARTERY DISEASE (ICD-414.00)  no sxs continue current medications  His updated medication list for this problem includes:    Metoprolol Tartrate 25 Mg Tabs (Metoprolol tartrate) .Marland Kitchen... Take 1 tablet by mouth two times a day    Nitroquick 0.4 Mg Subl (Nitroglycerin) .Marland Kitchen... Place 1 tablet  under tongue as needed    Benicar 40 Mg Tabs (Olmesartan medoxomil) .Marland Kitchen... 1 by mouth once daily    Chlorthalidone 25 Mg Tabs (Chlorthalidone) .Marland Kitchen... Take one-half  tablet by mouth daily    Effient 10 Mg Tabs (Prasugrel hcl) .Marland Kitchen... Take one tablet by mouth daily    Aspirin 81 Mg Tbec (Aspirin) .Marland Kitchen... Take one tablet by mouth daily. on hold  Labs Reviewed: Chol: 170 (08/31/2010)   HDL: 25.20 (08/31/2010)   LDL: DEL (11/17/2008)   TG: 305.0 (08/31/2010)  Lipid Goals: Chol Goal: 200 (04/26/2007)   HDL Goal: 40 (04/26/2007)   LDL Goal: 100 (04/26/2007)   TG Goal: 150 (04/26/2007)  Complete Medication List: 1)  Cetirizine Hcl 10 Mg Tabs (Cetirizine hcl) .... Take 1 tablet by mouth once a day 2)  Metoprolol Tartrate 25 Mg Tabs (Metoprolol tartrate) .... Take 1 tablet by mouth two times a day 3)  Nitroquick 0.4 Mg Subl (Nitroglycerin) .... Place 1 tablet under tongue as needed 4)  Levemir Flexpen 100 Unit/ml Soln (Insulin detemir) .... 60units subcutaneously two times a day or as directed 5)  Crestor 20 Mg Tabs (Rosuvastatin calcium) .Marland Kitchen.. 1 by mouth once daily 6)   Benicar 40 Mg Tabs (Olmesartan medoxomil) .Marland Kitchen.. 1 by mouth once daily 7)  Freestyle Lite Test Strp (Glucose blood) .... Use two to three times a day as directed 8)  Novolin N 100 Unit/ml Susp (Insulin isophane human) .... 30 units up to three times a day 9)  Bd U/f Short Pen Needle 31g X 8 Mm Misc (Insulin pen needle) .... Use as directed 10)  Chlorthalidone 25 Mg Tabs (Chlorthalidone) .... Take one-half  tablet by mouth daily 11)  Citalopram Hydrobromide 20 Mg Tabs (Citalopram hydrobromide) .... Take one tablet by mouth daily 12)  Trazodone Hcl 50 Mg Tabs (Trazodone hcl) .... 1/2 - 1 by mouth at bedtime as needed insomnia 13)  Warfarin Sodium 5 Mg Tabs (Warfarin sodium) .... Use as directed by anticoagulation clinic 14)  Urocrit  .... Take 1 tablet by mouth three times a day with meals 15)  Fish Oil 1000 Mg Caps (Omega-3 fatty acids) .... 3 once daily 16)  Align Caps (Misc intestinal flora regulat) .... Take one capsule by mouth daily 17)  Glucophage 1000 Mg Tabs (Metformin hcl) .... 1/2 tablet two times a day 18)  Effient 10 Mg Tabs (Prasugrel hcl) .... Take one tablet by mouth daily 19)  Aspirin 81 Mg Tbec (Aspirin) .... Take one tablet by mouth daily. on hold 20)  Anusol-hc 25 Mg Supp (Hydrocortisone acetate) .... Use 1 suppository at bedtime x 7 days then as needed 21)  Miralax Powd (Polyethylene glycol 3350) .... Take 1 dose 17 gram in a glass of water every other day  Patient Instructions: 1)  Please schedule a follow-up appointment in 3 months. 2)  labs one week prior to visit 3)  lipids---272.4 4)  lfts-995.2 5)  bmet-995.2 6)  A1C-250.02 7)       Orders Added: 1)  Protime [85610QW] 2)  Fingerstick [36416] 3)  Est. Patient Level IV [04540]     ANTICOAGULATION RECORD PREVIOUS REGIMEN & LAB RESULTS Anticoagulation Diagnosis:  v58.83,v58.61,415.19 on  12/03/2009 Previous INR Goal Range:  2.5-3.5 on  12/10/2009 Previous INR:  2.2 on  06/10/2010 Previous Coumadin Dose(mg):   5mg  on  07/19/2010 Previous Regimen:  same on  05/12/2010 Previous Coagulation Comments:  Missed 1 day on  12/03/2009  NEW REGIMEN & LAB RESULTS Current INR: 2.0 Regimen:  7.5mg . through weekend then resume  Repeat testing in: Tues.  Anticoagulation Visit Questionnaire Coumadin dose missed/changed:  No Abnormal Bleeding Symptoms:  Yes  Any diet changes including alcohol intake, vegetables or greens since the last visit:  No Any illnesses or hospitalizations since the last visit:  Yes Any signs of clotting since the last visit (including chest discomfort, dizziness, shortness of breath, arm tingling, slurred speech, swelling or redness in leg):  No  MEDICATIONS CETIRIZINE HCL 10 MG TABS (CETIRIZINE HCL) Take 1 tablet by mouth once a day METOPROLOL TARTRATE 25 MG TABS (METOPROLOL TARTRATE) Take 1 tablet by mouth two times a day NITROQUICK 0.4 MG SUBL (NITROGLYCERIN) Place 1 tablet under tongue as needed LEVEMIR FLEXPEN 100 UNIT/ML SOLN (INSULIN DETEMIR) 60units subcutaneously two times a day or as directed CRESTOR 20 MG  TABS (ROSUVASTATIN CALCIUM) 1 by mouth once daily BENICAR 40 MG  TABS (OLMESARTAN MEDOXOMIL) 1 by mouth once daily FREESTYLE LITE TEST  STRP (GLUCOSE BLOOD) use two to three times a day as directed NOVOLIN N 100 UNIT/ML SUSP (INSULIN ISOPHANE HUMAN) 30 Units up to three times a day BD U/F SHORT PEN NEEDLE 31G X 8 MM MISC (INSULIN PEN NEEDLE) use as directed CHLORTHALIDONE 25 MG TABS (CHLORTHALIDONE) Take one-half  tablet by mouth daily CITALOPRAM HYDROBROMIDE 20 MG TABS (CITALOPRAM HYDROBROMIDE) take one tablet by mouth daily TRAZODONE HCL 50 MG TABS (TRAZODONE HCL) 1/2 - 1 by mouth at bedtime as needed insomnia WARFARIN SODIUM 5 MG TABS (WARFARIN SODIUM) Use as directed by Anticoagulation Clinic * UROCRIT Take 1 tablet by mouth three times a day with meals FISH OIL 1000 MG CAPS (OMEGA-3 FATTY ACIDS) 3 once daily ALIGN   CAPS (MISC INTESTINAL FLORA REGULAT) Take one  capsule by mouth daily GLUCOPHAGE 1000 MG TABS (METFORMIN HCL) 1/2 tablet two times a day EFFIENT 10 MG TABS (PRASUGREL HCL) Take one tablet by mouth daily ASPIRIN 81 MG TBEC (ASPIRIN) Take one tablet by mouth daily. ON HOLD ANUSOL-HC 25 MG SUPP (HYDROCORTISONE ACETATE) Use 1 suppository at bedtime x 7 days then as needed MIRALAX  POWD (POLYETHYLENE GLYCOL 3350) Take 1 dose 17 gram in a glass of water every other day    Laboratory Results   Blood Tests      INR: 2.0   (Normal Range: 0.88-1.12   Therap INR: 2.0-3.5) Comments: Rita Ohara  September 10, 2010 12:02 PM

## 2010-10-28 NOTE — Assessment & Plan Note (Signed)
Summary: jaw pain   Visit Type:  Follow-up Referring Provider:  Shawnie Pons, MD  Primary Provider:  Birdie Sons, MD   CC:  jaw pain.  History of Present Illness: [name] has noticed some increased fatigue, as well as a bit of jaw pain intermittently.  He has not had rest symptoms.  As a result, he elected to come in.  These are not too disimilar from what he has had in the past.    Current Medications (verified): 1)  Cetirizine Hcl 10 Mg Tabs (Cetirizine Hcl) .... Take 1 Tablet By Mouth Once A Day 2)  Metoprolol Tartrate 25 Mg Tabs (Metoprolol Tartrate) .... Take 1 Tablet By Mouth Two Times A Day 3)  Nitroquick 0.4 Mg Subl (Nitroglycerin) .... Place 1 Tablet Under Tongue As Needed 4)  Plavix 75 Mg Tabs (Clopidogrel Bisulfate) .... Take 1 Tablet By Mouth Once A Day 5)  Levemir Flexpen 100 Unit/ml Soln (Insulin Detemir) .... 60units Subcutaneously Two Times A Day or As Directed 6)  Crestor 20 Mg  Tabs (Rosuvastatin Calcium) .Marland Kitchen.. 1 By Mouth Once Daily 7)  Benicar 40 Mg  Tabs (Olmesartan Medoxomil) .Marland Kitchen.. 1 By Mouth Once Daily 8)  Freestyle Lite Test  Strp (Glucose Blood) .... Use Two To Three Times A Day As Directed 9)  Novolin N 100 Unit/ml Susp (Insulin Isophane Human) .... 30 Units With Meals and At Bedtime 10)  Bd U/f Short Pen Needle 31g X 8 Mm Misc (Insulin Pen Needle) .... Use As Directed 11)  Chlorthalidone 25 Mg Tabs (Chlorthalidone) .... Take One-Half  Tablet By Mouth Daily 12)  Citalopram Hydrobromide 20 Mg Tabs (Citalopram Hydrobromide) .... Take One Tablet By Mouth Daily 13)  Trazodone Hcl 50 Mg Tabs (Trazodone Hcl) .... 1/2 - 1 By Mouth At Bedtime As Needed Insomnia 14)  Warfarin Sodium 5 Mg Tabs (Warfarin Sodium) .... Use As Directed By Anticoagulation Clinic 15)  Omeprazole 20 Mg Cpdr (Omeprazole) .... One By Mouth Daily 16)  Urocrit .... Take 1 Tablet By Mouth Three Times A Day With Meals 17)  Fish Oil 1000 Mg Caps (Omega-3 Fatty Acids) .... 3 Once Daily 18)  Align   Caps  (Misc Intestinal Flora Regulat) .... Take One Capsule By Mouth Daily 19)  Glucophage 1000 Mg Tabs (Metformin Hcl) .... 1/2 Tablet Two Times A Day 20)  Anusol-Hc 25 Mg Supp (Hydrocortisone Acetate) .Marland Kitchen.. 1 Per Rectum Nightly For 1 Week Then As Needed For Bleeding Hemorrhoids 21)  Novolin N 100 Unit/ml Susp (Insulin Isophane Human) .... Use As Directed With Meals and At Bedtime  Allergies: 1)  ! Vioxx 2)  Feldene (Piroxicam)  Past History:  Past Medical History: Last updated: 04/28/2010 RENAL ARTERY STENOSIS (ICD-440.1) CORONARY ARTERY DISEASE (ICD-414.00) HYPERLIPIDEMIA (ICD-272.4) HYPERTENSION (ICD-401.9) OBESITY-MORBID (>100') (ICD-278.01) PSORIASIS (ICD-696.1) ESOPHAGEAL STRICTURE (ICD-530.3) DUODENITIS, WITHOUT HEMORRHAGE (ICD-535.60) SLEEP APNEA, OBSTRUCTIVE (ICD-327.23) DIABETES MELLITUS, TYPE II, UNCONTROLLED (ICD-250.02) GERD (ICD-530.81) Pulmonary embolism, hx of Nephrolithiasis, hx of Depression Factor V Leiden deficiency  Past Surgical History: Last updated: 04/28/2010 Colonoscopy-03/06/2007 EGD-10/03/2002 RCA Stent x 2 Knee arthroscopy Angioplasty - 2005 & 2011 Knee Arthroscopy- 2007 PTCA/stent---2009  Family History: Last updated: 04/28/2010 Family History Diabetes 1st degree relative brother deceased-dm, htn, chf Family History of Coronary Artery Disease:  mother pulmonary embolus 2010 (factor V deficiency) No FH of Colon Cancer:  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  Alcohol Use - no Daily Caffeine Use: one daily  Illicit Drug Use - no  Vital Signs:  Patient profile:   60 year old male Height:      71 inches Weight:      266.50 pounds BMI:     37.30 Pulse rate:   67 / minute Pulse rhythm:   regular Resp:     18 per minute BP sitting:   110 / 80  (left arm) Cuff size:   large  Vitals Entered By: Vikki Ports (July 06, 2010 11:20 AM)  Physical Exam  General:   Well developed, well nourished, in no acute distress. Head:  normocephalic and atraumatic Eyes:  PERRLA/EOM intact; conjunctiva and lids normal. Lungs:  Clear bilaterally to auscultation and percussion. Heart:  PMI non displaced.  P2 not increased.   Normal S1 and S2.  No def murmur. Pulses:  pulses normal in all 4 extremities Extremities:  No clubbing or cyanosis. Neurologic:  Alert and oriented x 3.   Cardiac Cath  Procedure date:  04/06/2010  Findings:       ANGIOGRAPHIC DATA:  1.  The left main is free of critical disease.  2.  The left anterior descending artery is a moderately calcified vessel.      There is about a 40% area of eccentric plaquing just prior to the      stented site which is just after the diagonal.  Distal to the stent      site, there is some mild luminal irregularity.  Proximal diagonal branch      has about 40-50% narrowing in its ostium as well, although it does not      appear to be critical.  The LAD appears to be slightly worse in the LAO      cranial view, but it does not appear to be any different than at the      time of the procedure itself and I would grade it at no more than 50% as      noted.  3.  The circumflex provides a large first marginal branch whose terminal      branches have subtotal occlusions of 90%.  This is similar to what she      had previously.  It is fairly distal.  The second marginal branch is      large in caliber and free of critical disease.  4.  The right coronary artery also has diffuse luminal irregularity with      some calcification and about 30-40% narrowing distally prior to the PDA      takeoff.  This is eccentric.   Cardiac Cath  Procedure date:  04/06/2010  Findings:       IMPRESSION:  Successful percutaneous coronary intervention with cutting   balloon angioplasty of the area of severe in-stent restenosis in the mid   right coronary artery.      RECOMMENDATIONS:  Continue current cardiac management,  which includes   aspirin and Plavix.               Verne Carrow, MD   Cardiac Cath  Procedure date:  04/06/2010  Findings:       Circumflex coronary artery had intermediate branch with 30% to 40%   multiple discrete lesions.  There was a small obtuse marginal branch   with 30% multiple discrete lesions.  There was a stent in the midportion   of the circumflex which was widely patent.  First obtuse marginal branch   had 30% multiple discrete lesions.      Right coronary artery was  large and dominant.  There were 30% multiple   discrete lesions in the proximal portion.  There was a large area of the   mid and distal vessel covered by stent.  The distal portion of the   overlapping stents had a 90% discrete stenosis.  PDA had 30% to 40%   multiple discrete lesions.  Posterolateral branch had 30% multiple   discrete lesions.      RAO ventriculography:  RAO ventriculography was normal.  EF was 60%.   There was no gradient across the aortic valve.  No MR.  Aortic pressure   was 125/73.  LV pressure was 125/80.      IMPRESSION:  The films have been reviewed of Dr. Clifton James.  He will   proceed with cutting balloon therapy of the distal right coronary artery   in-stent restenosis.               Noralyn Pick. Eden Emms, MD, [facility]   EKG  Procedure date:  07/06/2010  Findings:      NSR.  LAD.  No acute changes.  Impression & Recommendations:  Problem # 1:  CORONARY ARTERY DISEASE (ICD-414.00) symptoms are worrisome for possible ischemia.  Has hyeprcoagulable disorder, and is increased risk for coming off of Coumadin for cath, so we will proceed with stress myoview first.  If abnormal, then he will need cath. His updated medication list for this problem includes:    Metoprolol Tartrate 25 Mg Tabs (Metoprolol tartrate) .Marland Kitchen... Take 1 tablet by mouth two times a day    Nitroquick 0.4 Mg Subl (Nitroglycerin) .Marland Kitchen... Place 1 tablet under tongue as needed    Plavix 75 Mg Tabs  (Clopidogrel bisulfate) .Marland Kitchen... Take 1 tablet by mouth once a day    Warfarin Sodium 5 Mg Tabs (Warfarin sodium) ..... Use as directed by anticoagulation clinic  Orders: EKG w/ Interpretation (93000) Nuclear Stress Test (Nuc Stress Test)  Problem # 2:  PULMONARY EMBOLISM (ICD-415.19) Has Factor V disorder with hypercoagulable state, on lifelong anticoagulation treatment.  See above. His updated medication list for this problem includes:    Plavix 75 Mg Tabs (Clopidogrel bisulfate) .Marland Kitchen... Take 1 tablet by mouth once a day    Warfarin Sodium 5 Mg Tabs (Warfarin sodium) ..... Use as directed by anticoagulation clinic  Problem # 3:  PULMONARY EMBOLISM (ICD-415.19) doubt symptoms related to this.  His updated medication list for this problem includes:    Plavix 75 Mg Tabs (Clopidogrel bisulfate) .Marland Kitchen... Take 1 tablet by mouth once a day    Warfarin Sodium 5 Mg Tabs (Warfarin sodium) ..... Use as directed by anticoagulation clinic  Problem # 4:  HYPERTENSION (ICD-401.9) managed by Dr. Cato Mulligan. His updated medication list for this problem includes:    Metoprolol Tartrate 25 Mg Tabs (Metoprolol tartrate) .Marland Kitchen... Take 1 tablet by mouth two times a day    Benicar 40 Mg Tabs (Olmesartan medoxomil) .Marland Kitchen... 1 by mouth once daily    Chlorthalidone 25 Mg Tabs (Chlorthalidone) .Marland Kitchen... Take one-half  tablet by mouth daily  Patient Instructions: 1)  Your physician recommends that you schedule a follow-up appointment in: 1 WEEK 2)  Your physician recommends that you continue on your current medications as directed. Please refer to the Current Medication list given to you today. 3)  Your physician has requested that you have an exercise stress myoview.  For further information please visit [email or web address]  Please follow instruction sheet, as given.

## 2010-10-29 ENCOUNTER — Ambulatory Visit (HOSPITAL_COMMUNITY): Payer: Self-pay

## 2010-11-01 ENCOUNTER — Ambulatory Visit (HOSPITAL_COMMUNITY): Payer: Self-pay

## 2010-11-03 ENCOUNTER — Ambulatory Visit (HOSPITAL_COMMUNITY): Payer: Self-pay

## 2010-11-05 ENCOUNTER — Ambulatory Visit (HOSPITAL_COMMUNITY): Payer: Self-pay

## 2010-11-08 ENCOUNTER — Encounter (HOSPITAL_COMMUNITY): Payer: Self-pay

## 2010-11-08 ENCOUNTER — Encounter: Payer: Self-pay | Admitting: Internal Medicine

## 2010-11-08 ENCOUNTER — Ambulatory Visit (INDEPENDENT_AMBULATORY_CARE_PROVIDER_SITE_OTHER): Payer: PRIVATE HEALTH INSURANCE | Admitting: Internal Medicine

## 2010-11-08 ENCOUNTER — Ambulatory Visit (HOSPITAL_COMMUNITY): Payer: Self-pay

## 2010-11-08 DIAGNOSIS — I2699 Other pulmonary embolism without acute cor pulmonale: Secondary | ICD-10-CM

## 2010-11-08 DIAGNOSIS — J069 Acute upper respiratory infection, unspecified: Secondary | ICD-10-CM

## 2010-11-08 LAB — POCT INR: INR: 2.8

## 2010-11-08 MED ORDER — DOXYCYCLINE MONOHYDRATE 100 MG PO CAPS: 100.0000 mg | ORAL_CAPSULE | Freq: Two times a day (BID) | ORAL | Status: AC

## 2010-11-08 NOTE — Patient Instructions (Addendum)
  Latest dosing instructions   Total Sun Mon Tue Wed Thu Fri Sat   42.5 5 mg 5 mg 7.5 mg 5 mg 7.5 mg 5 mg 7.5 mg    (5 mg1) (5 mg1) (5 mg1.5) (5 mg1) (5 mg1.5) (5 mg1) (5 mg1.5)

## 2010-11-08 NOTE — Progress Notes (Signed)
4 week hx of uri sxs---getting worse Now with significant sinus congestion No fever or chills,  No sweats.  Some associated headache, no known ill contacts  ROS:  patient denies chest pain, shortness of breath, orthopnea. Denies lower extremity edema, abdominal pain, change in appetite, change in bowel movements. Patient denies rashes, musculoskeletal complaints. No other specific complaints in a complete review of systems.    Exam:   well-developed well-nourished male in no acute distress. HEENT exam atraumatic, normocephalic, neck supple without jugular venous distention. Chest clear to auscultation cardiac exam S1-S2 are regular. Abdominal exam overweight with bowel sounds, soft and nontender. Extremities no edema. Neurologic exam is alert with a normal gait.    Plan: Patient with URI. Duration somewhat concerning. I think it's best to treat with an antibiotic. Doxycycline called in. We'll check a protime today and then likely  At the end of this week.  side effects of medications discussed. protime on Thursday---pt understands

## 2010-11-10 ENCOUNTER — Ambulatory Visit (HOSPITAL_COMMUNITY): Payer: Self-pay

## 2010-11-10 ENCOUNTER — Encounter (HOSPITAL_COMMUNITY): Payer: Self-pay

## 2010-11-11 ENCOUNTER — Ambulatory Visit (INDEPENDENT_AMBULATORY_CARE_PROVIDER_SITE_OTHER): Payer: PRIVATE HEALTH INSURANCE | Admitting: Internal Medicine

## 2010-11-11 DIAGNOSIS — I2699 Other pulmonary embolism without acute cor pulmonale: Secondary | ICD-10-CM

## 2010-11-11 NOTE — Letter (Signed)
Summary: Cardiac Rehab Maintenance Program  Cardiac Rehab Maintenance Program   Imported By: Marylou Mccoy 11/05/2010 09:14:24  _____________________________________________________________________  External Attachment:    Type:   Image     Comment:   External Document

## 2010-11-12 ENCOUNTER — Ambulatory Visit (HOSPITAL_COMMUNITY): Payer: Self-pay

## 2010-11-12 ENCOUNTER — Encounter (HOSPITAL_COMMUNITY): Payer: Self-pay

## 2010-11-15 ENCOUNTER — Ambulatory Visit (HOSPITAL_COMMUNITY): Payer: Self-pay

## 2010-11-15 ENCOUNTER — Encounter (HOSPITAL_COMMUNITY): Payer: Self-pay

## 2010-11-17 ENCOUNTER — Encounter (HOSPITAL_COMMUNITY): Payer: Self-pay

## 2010-11-17 ENCOUNTER — Ambulatory Visit (HOSPITAL_COMMUNITY): Payer: Self-pay

## 2010-11-17 NOTE — Miscellaneous (Signed)
Summary: [facility] Progress Note   [facility] Progress Note   Imported By: Roderic Ovens 11/10/2010 14:18:46  _____________________________________________________________________  External Attachment:    Type:   Image     Comment:   External Document

## 2010-11-17 NOTE — Letter (Signed)
Summary: Dr [facility] Office Note   Dr [facility] Office Note   Imported By: Roderic Ovens 11/12/2010 14:04:36  _____________________________________________________________________  External Attachment:    Type:   Image     Comment:   External Document

## 2010-11-19 ENCOUNTER — Ambulatory Visit (HOSPITAL_COMMUNITY): Payer: Self-pay

## 2010-11-19 ENCOUNTER — Other Ambulatory Visit: Payer: Self-pay | Admitting: Internal Medicine

## 2010-11-19 ENCOUNTER — Encounter (HOSPITAL_COMMUNITY): Payer: Self-pay

## 2010-11-22 ENCOUNTER — Encounter (HOSPITAL_COMMUNITY): Payer: Self-pay

## 2010-11-22 ENCOUNTER — Ambulatory Visit (HOSPITAL_COMMUNITY): Payer: Self-pay

## 2010-11-23 ENCOUNTER — Telehealth: Payer: Self-pay | Admitting: *Deleted

## 2010-11-23 DIAGNOSIS — J329 Chronic sinusitis, unspecified: Secondary | ICD-10-CM

## 2010-11-23 NOTE — Telephone Encounter (Signed)
Lets get CT sinuses first

## 2010-11-23 NOTE — Telephone Encounter (Signed)
Still has cough and sinusitis and would like another antibiotic.

## 2010-11-24 ENCOUNTER — Encounter (HOSPITAL_COMMUNITY): Payer: Self-pay

## 2010-11-24 ENCOUNTER — Ambulatory Visit (HOSPITAL_COMMUNITY): Payer: Self-pay

## 2010-11-25 ENCOUNTER — Ambulatory Visit: Payer: Self-pay | Admitting: Cardiology

## 2010-11-26 ENCOUNTER — Encounter (HOSPITAL_COMMUNITY): Payer: Self-pay

## 2010-11-26 ENCOUNTER — Telehealth: Payer: Self-pay | Admitting: *Deleted

## 2010-11-26 ENCOUNTER — Ambulatory Visit (HOSPITAL_COMMUNITY): Payer: Self-pay

## 2010-11-26 NOTE — Telephone Encounter (Signed)
Addended by: Alfred Levins on: 11/26/2010 04:42 PM   Modules accepted: Orders

## 2010-11-26 NOTE — Telephone Encounter (Signed)
ERROR

## 2010-11-29 ENCOUNTER — Encounter (HOSPITAL_COMMUNITY): Payer: Self-pay

## 2010-11-29 ENCOUNTER — Ambulatory Visit (HOSPITAL_COMMUNITY): Payer: Self-pay

## 2010-12-01 ENCOUNTER — Ambulatory Visit (HOSPITAL_COMMUNITY): Payer: Self-pay

## 2010-12-01 ENCOUNTER — Encounter (HOSPITAL_COMMUNITY): Payer: Self-pay

## 2010-12-02 ENCOUNTER — Other Ambulatory Visit (INDEPENDENT_AMBULATORY_CARE_PROVIDER_SITE_OTHER): Payer: PRIVATE HEALTH INSURANCE | Admitting: Internal Medicine

## 2010-12-02 DIAGNOSIS — T887XXA Unspecified adverse effect of drug or medicament, initial encounter: Secondary | ICD-10-CM

## 2010-12-02 DIAGNOSIS — E785 Hyperlipidemia, unspecified: Secondary | ICD-10-CM

## 2010-12-02 DIAGNOSIS — E119 Type 2 diabetes mellitus without complications: Secondary | ICD-10-CM

## 2010-12-02 LAB — HEPATIC FUNCTION PANEL
AST: 19 U/L (ref 0–37)
Alkaline Phosphatase: 120 U/L — ABNORMAL HIGH (ref 39–117)
Bilirubin, Direct: 0.1 mg/dL (ref 0.0–0.3)
Total Bilirubin: 0.6 mg/dL (ref 0.3–1.2)

## 2010-12-02 LAB — BASIC METABOLIC PANEL
BUN: 28 mg/dL — ABNORMAL HIGH (ref 6–23)
CO2: 28 mEq/L (ref 19–32)
Calcium: 10 mg/dL (ref 8.4–10.5)
Glucose, Bld: 209 mg/dL — ABNORMAL HIGH (ref 70–99)
Sodium: 138 mEq/L (ref 135–145)

## 2010-12-02 LAB — LIPID PANEL
Cholesterol: 173 mg/dL (ref 0–200)
HDL: 28 mg/dL — ABNORMAL LOW (ref >39.00)
Triglycerides: 380 mg/dL — ABNORMAL HIGH (ref 0.0–149.0)

## 2010-12-03 ENCOUNTER — Ambulatory Visit (INDEPENDENT_AMBULATORY_CARE_PROVIDER_SITE_OTHER)
Admission: RE | Admit: 2010-12-03 | Discharge: 2010-12-03 | Disposition: A | Discharge status: Home / Self Care | Payer: PRIVATE HEALTH INSURANCE | Source: Ambulatory Visit | Attending: Internal Medicine | Admitting: Internal Medicine

## 2010-12-03 ENCOUNTER — Ambulatory Visit (HOSPITAL_COMMUNITY): Payer: Self-pay

## 2010-12-03 ENCOUNTER — Other Ambulatory Visit: Payer: Self-pay | Admitting: Internal Medicine

## 2010-12-03 ENCOUNTER — Encounter (HOSPITAL_COMMUNITY): Payer: Self-pay

## 2010-12-03 DIAGNOSIS — J329 Chronic sinusitis, unspecified: Secondary | ICD-10-CM

## 2010-12-06 ENCOUNTER — Ambulatory Visit (HOSPITAL_COMMUNITY): Payer: Self-pay

## 2010-12-06 ENCOUNTER — Encounter (HOSPITAL_COMMUNITY): Payer: Self-pay

## 2010-12-06 LAB — GLUCOSE, CAPILLARY
Glucose-Capillary: 175 mg/dL — ABNORMAL HIGH (ref 70–99)
Glucose-Capillary: 180 mg/dL — ABNORMAL HIGH (ref 70–99)

## 2010-12-07 LAB — PLATELET INHIBITION P2Y12: Platelet Function  P2Y12: 151 [PRU] — ABNORMAL LOW (ref 194–418)

## 2010-12-07 LAB — CBC
HCT: 38.6 % — ABNORMAL LOW (ref 39.0–52.0)
Hemoglobin: 12.8 g/dL — ABNORMAL LOW (ref 13.0–17.0)
MCH: 27.2 pg (ref 26.0–34.0)
MCHC: 33.2 g/dL (ref 30.0–36.0)
RBC: 4.71 MIL/uL (ref 4.22–5.81)

## 2010-12-08 ENCOUNTER — Ambulatory Visit (HOSPITAL_COMMUNITY): Payer: Self-pay

## 2010-12-08 ENCOUNTER — Encounter (HOSPITAL_COMMUNITY): Payer: Self-pay

## 2010-12-08 ENCOUNTER — Other Ambulatory Visit: Payer: Self-pay | Admitting: Internal Medicine

## 2010-12-08 LAB — CBC
HCT: 37.1 % — ABNORMAL LOW (ref 39.0–52.0)
HCT: 37.8 % — ABNORMAL LOW (ref 39.0–52.0)
Hemoglobin: 12.7 g/dL — ABNORMAL LOW (ref 13.0–17.0)
Hemoglobin: 13.6 g/dL (ref 13.0–17.0)
MCH: 27.4 pg (ref 26.0–34.0)
MCH: 27.6 pg (ref 26.0–34.0)
MCV: 82.1 fL (ref 78.0–100.0)
MCV: 82.3 fL (ref 78.0–100.0)
MCV: 82.7 fL (ref 78.0–100.0)
Platelets: 184 10*3/uL (ref 150–400)
Platelets: 192 10*3/uL (ref 150–400)
Platelets: 199 10*3/uL (ref 150–400)
RBC: 4.51 MIL/uL (ref 4.22–5.81)
RBC: 4.57 MIL/uL (ref 4.22–5.81)
RBC: 4.92 MIL/uL (ref 4.22–5.81)
RDW: 14.4 % (ref 11.5–15.5)
RDW: 14.5 % (ref 11.5–15.5)
WBC: 3.7 10*3/uL — ABNORMAL LOW (ref 4.0–10.5)
WBC: 4.6 10*3/uL (ref 4.0–10.5)
WBC: 4.7 10*3/uL (ref 4.0–10.5)

## 2010-12-08 LAB — COMPREHENSIVE METABOLIC PANEL
ALT: 22 U/L (ref 0–53)
AST: 20 U/L (ref 0–37)
Albumin: 3.9 g/dL (ref 3.5–5.2)
Alkaline Phosphatase: 119 U/L — ABNORMAL HIGH (ref 39–117)
CO2: 25 mEq/L (ref 19–32)
Chloride: 103 mEq/L (ref 96–112)
Creatinine, Ser: 1.07 mg/dL (ref 0.4–1.5)
GFR calc Af Amer: >60 mL/min (ref >60)
GFR calc non Af Amer: >60 mL/min (ref >60)
Potassium: 4.5 mEq/L (ref 3.5–5.1)
Total Bilirubin: 0.8 mg/dL (ref 0.3–1.2)

## 2010-12-08 LAB — GLUCOSE, CAPILLARY
Glucose-Capillary: 160 mg/dL — ABNORMAL HIGH (ref 70–99)
Glucose-Capillary: 166 mg/dL — ABNORMAL HIGH (ref 70–99)
Glucose-Capillary: 167 mg/dL — ABNORMAL HIGH (ref 70–99)
Glucose-Capillary: 192 mg/dL — ABNORMAL HIGH (ref 70–99)
Glucose-Capillary: 195 mg/dL — ABNORMAL HIGH (ref 70–99)
Glucose-Capillary: 256 mg/dL — ABNORMAL HIGH (ref 70–99)
Glucose-Capillary: 267 mg/dL — ABNORMAL HIGH (ref 70–99)
Glucose-Capillary: 272 mg/dL — ABNORMAL HIGH (ref 70–99)

## 2010-12-08 LAB — BASIC METABOLIC PANEL
BUN: 18 mg/dL (ref 6–23)
CO2: 26 mEq/L (ref 19–32)
Calcium: 9.4 mg/dL (ref 8.4–10.5)
Chloride: 103 mEq/L (ref 96–112)
Chloride: 106 mEq/L (ref 96–112)
Creatinine, Ser: 0.93 mg/dL (ref 0.4–1.5)
Creatinine, Ser: 0.94 mg/dL (ref 0.4–1.5)
GFR calc Af Amer: >60 mL/min (ref >60)
Sodium: 134 mEq/L — ABNORMAL LOW (ref 135–145)

## 2010-12-08 LAB — PLATELET INHIBITION P2Y12
P2Y12 % Inhibition: 2 %
Platelet Function  P2Y12: 232 [PRU] (ref 194–418)
Platelet Function Baseline: 354 [PRU] (ref 194–418)

## 2010-12-08 LAB — PROTIME-INR
INR: 1.3 (ref 0.00–1.49)
INR: 1.49 (ref 0.00–1.49)
Prothrombin Time: 17.9 seconds — ABNORMAL HIGH (ref 11.6–15.2)
Prothrombin Time: 18.2 seconds — ABNORMAL HIGH (ref 11.6–15.2)

## 2010-12-08 LAB — CARDIAC PANEL(CRET KIN+CKTOT+MB+TROPI)
Total CK: 100 U/L (ref 7–232)
Troponin I: 0.01 ng/mL (ref 0.00–0.06)

## 2010-12-08 LAB — HEPARIN LEVEL (UNFRACTIONATED): Heparin Unfractionated: <0.1 IU/mL — ABNORMAL LOW (ref 0.30–0.70)

## 2010-12-08 LAB — APTT: aPTT: 33 seconds (ref 24–37)

## 2010-12-10 ENCOUNTER — Encounter (HOSPITAL_COMMUNITY): Payer: Self-pay

## 2010-12-10 ENCOUNTER — Ambulatory Visit: Payer: Self-pay | Admitting: Internal Medicine

## 2010-12-10 ENCOUNTER — Ambulatory Visit (HOSPITAL_COMMUNITY): Payer: Self-pay

## 2010-12-12 LAB — BASIC METABOLIC PANEL
BUN: 18 mg/dL (ref 6–23)
BUN: 25 mg/dL — ABNORMAL HIGH (ref 6–23)
CO2: 25 mEq/L (ref 19–32)
CO2: 26 mEq/L (ref 19–32)
Calcium: 9.4 mg/dL (ref 8.4–10.5)
Chloride: 104 mEq/L (ref 96–112)
Creatinine, Ser: 1.23 mg/dL (ref 0.4–1.5)
GFR calc Af Amer: >60 mL/min (ref >60)
GFR calc non Af Amer: >60 mL/min (ref >60)
GFR calc non Af Amer: >60 mL/min (ref >60)
Glucose, Bld: 144 mg/dL — ABNORMAL HIGH (ref 70–99)
Glucose, Bld: 223 mg/dL — ABNORMAL HIGH (ref 70–99)
Potassium: 4.3 mEq/L (ref 3.5–5.1)
Potassium: 4.3 mEq/L (ref 3.5–5.1)

## 2010-12-12 LAB — LIPID PANEL
Cholesterol: 173 mg/dL (ref 0–200)
HDL: 32 mg/dL — ABNORMAL LOW (ref >39)
LDL Cholesterol: UNDETERMINED mg/dL (ref 0–99)
Total CHOL/HDL Ratio: 5.4 RATIO
Triglycerides: 471 mg/dL — ABNORMAL HIGH (ref <150)

## 2010-12-12 LAB — CBC
HCT: 37.3 % — ABNORMAL LOW (ref 39.0–52.0)
HCT: 37.9 % — ABNORMAL LOW (ref 39.0–52.0)
HCT: 38.5 % — ABNORMAL LOW (ref 39.0–52.0)
Hemoglobin: 13.1 g/dL (ref 13.0–17.0)
Hemoglobin: 13.2 g/dL (ref 13.0–17.0)
MCH: 28.7 pg (ref 26.0–34.0)
MCH: 28.9 pg (ref 26.0–34.0)
MCHC: 33.7 g/dL (ref 30.0–36.0)
MCHC: 34 g/dL (ref 30.0–36.0)
MCHC: 34.2 g/dL (ref 30.0–36.0)
MCV: 83.4 fL (ref 78.0–100.0)
MCV: 84.8 fL (ref 78.0–100.0)
Platelets: 176 10*3/uL (ref 150–400)
Platelets: 189 10*3/uL (ref 150–400)
RBC: 4.55 MIL/uL (ref 4.22–5.81)
RBC: 4.55 MIL/uL (ref 4.22–5.81)
RDW: 14.7 % (ref 11.5–15.5)
RDW: 14.7 % (ref 11.5–15.5)
WBC: 4 10*3/uL (ref 4.0–10.5)
WBC: 4.4 10*3/uL (ref 4.0–10.5)

## 2010-12-12 LAB — GLUCOSE, CAPILLARY
Glucose-Capillary: 114 mg/dL — ABNORMAL HIGH (ref 70–99)
Glucose-Capillary: 145 mg/dL — ABNORMAL HIGH (ref 70–99)
Glucose-Capillary: 184 mg/dL — ABNORMAL HIGH (ref 70–99)
Glucose-Capillary: 195 mg/dL — ABNORMAL HIGH (ref 70–99)
Glucose-Capillary: 214 mg/dL — ABNORMAL HIGH (ref 70–99)
Glucose-Capillary: 226 mg/dL — ABNORMAL HIGH (ref 70–99)
Glucose-Capillary: 249 mg/dL — ABNORMAL HIGH (ref 70–99)
Glucose-Capillary: 87 mg/dL (ref 70–99)

## 2010-12-12 LAB — POCT CARDIAC MARKERS
Myoglobin, poc: 169 ng/mL (ref 12–200)
Troponin i, poc: <0.05 ng/mL (ref 0.00–0.09)

## 2010-12-12 LAB — DIFFERENTIAL
Eosinophils Absolute: 0.1 10*3/uL (ref 0.0–0.7)
Eosinophils Relative: 4 % (ref 0–5)
Lymphs Abs: 0.9 10*3/uL (ref 0.7–4.0)
Monocytes Absolute: 0 10*3/uL — ABNORMAL LOW (ref 0.1–1.0)
Monocytes Relative: 1 % — ABNORMAL LOW (ref 3–12)

## 2010-12-12 LAB — CARDIAC PANEL(CRET KIN+CKTOT+MB+TROPI)
CK, MB: 1.7 ng/mL (ref 0.3–4.0)
CK, MB: 1.7 ng/mL (ref 0.3–4.0)
Troponin I: 0.01 ng/mL (ref 0.00–0.06)

## 2010-12-12 LAB — URINALYSIS, ROUTINE W REFLEX MICROSCOPIC
Bilirubin Urine: NEGATIVE
Ketones, ur: NEGATIVE mg/dL
Nitrite: NEGATIVE
Urobilinogen, UA: 0.2 mg/dL (ref 0.0–1.0)
pH: 5.5 (ref 5.0–8.0)

## 2010-12-12 LAB — PROTIME-INR
INR: 1.58 — ABNORMAL HIGH (ref 0.00–1.49)
INR: 1.6 — ABNORMAL HIGH (ref 0.00–1.49)

## 2010-12-12 LAB — CK TOTAL AND CKMB (NOT AT ARMC): Relative Index: INVALID (ref 0.0–2.5)

## 2010-12-13 ENCOUNTER — Encounter (HOSPITAL_COMMUNITY): Payer: Self-pay

## 2010-12-13 ENCOUNTER — Encounter: Payer: Self-pay | Admitting: Internal Medicine

## 2010-12-13 ENCOUNTER — Ambulatory Visit (INDEPENDENT_AMBULATORY_CARE_PROVIDER_SITE_OTHER): Payer: PRIVATE HEALTH INSURANCE | Admitting: Internal Medicine

## 2010-12-13 ENCOUNTER — Ambulatory Visit (HOSPITAL_COMMUNITY): Payer: Self-pay

## 2010-12-13 VITALS — BP 114/76 | HR 92 | Temp 98.6°F | Wt 268.0 lb

## 2010-12-13 DIAGNOSIS — I2510 Atherosclerotic heart disease of native coronary artery without angina pectoris: Secondary | ICD-10-CM

## 2010-12-13 DIAGNOSIS — E119 Type 2 diabetes mellitus without complications: Secondary | ICD-10-CM

## 2010-12-13 DIAGNOSIS — I2699 Other pulmonary embolism without acute cor pulmonale: Secondary | ICD-10-CM

## 2010-12-13 LAB — POCT INR: INR: 2.4

## 2010-12-13 MED ORDER — INSULIN DETEMIR 100 UNIT/ML ~~LOC~~ SOLN: 55.0000 [IU] | Freq: Two times a day (BID) | SUBCUTANEOUS | Status: DC

## 2010-12-13 NOTE — Progress Notes (Signed)
  Subjective:    Patient ID: [name], male    DOB: 08-29-1951, 60 y.o.   MRN: [id number]  HPI Recent sinusitis---much improved---reviewed ct with patient  htn---no sxs  CAD---no CP, SOB, pnd  Hx PE---on warfarin---no SOB  Past Medical History  Diagnosis Date  . HYPERLIPIDEMIA 04/12/2007  . HYPERTENSION 04/12/2007  . CORONARY ARTERY DISEASE 04/12/2007  . GERD 04/12/2007  . DIABETES MELLITUS, TYPE II, UNCONTROLLED 06/08/2007  . SLEEP APNEA, OBSTRUCTIVE 01/16/2009  . RENAL ARTERY STENOSIS 01/16/2009  . ESOPHAGEAL STRICTURE 01/16/2009  . PSORIASIS 01/16/2009  . PULMONARY EMBOLISM 01/20/2009  . 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 NEPHROLITHIASIS, HX OF 02/27/2009  . DEPRESSION 03/08/2009  . DYSPNEA 03/22/2010  . ANEMIA-UNSPECIFIED 04/28/2010  . DIABETES MELLITUS-TYPE II 09/01/2010   Past Surgical History  Procedure Date  . Knee arthroscopy 2007  . Rca stent   . Angioplasty 2005 & 2011    reports that he 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 He does not have any smokeless tobacco history on file. He reports that he does not drink alcohol or use illicit drugs. family history includes Diabetes in his brother; Factor V Leiden deficiency in his mother; Heart failure in his brother; Hypertension in his brother; and Pulmonary embolism in his mother. Allergies  Allergen Reactions  . Piroxicam     REACTION: unspecified: blistering on hands  . Rofecoxib     REACTION: Reaction not known      Review of Systems  patient denies chest pain, shortness of breath, orthopnea. Denies lower extremity edema, abdominal pain, change in appetite, change in bowel movements. Patient denies rashes, musculoskeletal complaints. No other specific complaints in a complete review of systems.      Objective:   Physical Exam  well-developed well-nourished male in no acute distress. HEENT exam atraumatic, normocephalic, neck supple without jugular venous distention. Chest clear to auscultation cardiac exam S1-S2 are regular. Abdominal  exam overweight with bowel sounds, soft and nontender. Extremities no edema. Neurologic exam is alert with a normal gait.        Assessment & Plan:

## 2010-12-14 NOTE — Assessment & Plan Note (Signed)
Not well controlled Needs aggressive weight loss Discussed need for diet daily and regular exercise

## 2010-12-14 NOTE — Assessment & Plan Note (Signed)
No sxs Desperately needs to lose weight and start to exercise

## 2010-12-14 NOTE — Assessment & Plan Note (Signed)
No known recurrence Continue anticoagulation

## 2010-12-15 ENCOUNTER — Encounter (HOSPITAL_COMMUNITY): Payer: Self-pay

## 2010-12-15 ENCOUNTER — Ambulatory Visit (HOSPITAL_COMMUNITY): Payer: Self-pay

## 2010-12-17 ENCOUNTER — Encounter (HOSPITAL_COMMUNITY): Payer: Self-pay

## 2010-12-17 ENCOUNTER — Ambulatory Visit (HOSPITAL_COMMUNITY): Payer: Self-pay

## 2010-12-20 ENCOUNTER — Encounter: Payer: Self-pay | Admitting: *Deleted

## 2010-12-20 ENCOUNTER — Encounter (HOSPITAL_COMMUNITY): Payer: Self-pay

## 2010-12-20 ENCOUNTER — Ambulatory Visit (HOSPITAL_COMMUNITY): Payer: Self-pay

## 2010-12-22 ENCOUNTER — Ambulatory Visit (HOSPITAL_COMMUNITY): Payer: Self-pay

## 2010-12-22 ENCOUNTER — Encounter (HOSPITAL_COMMUNITY): Payer: Self-pay

## 2010-12-24 ENCOUNTER — Ambulatory Visit (HOSPITAL_COMMUNITY): Payer: Self-pay

## 2010-12-24 ENCOUNTER — Encounter (HOSPITAL_COMMUNITY): Payer: Self-pay

## 2010-12-27 ENCOUNTER — Encounter (HOSPITAL_COMMUNITY): Payer: Self-pay

## 2010-12-27 ENCOUNTER — Ambulatory Visit (HOSPITAL_COMMUNITY): Payer: Self-pay

## 2010-12-29 ENCOUNTER — Encounter (HOSPITAL_COMMUNITY): Payer: Self-pay

## 2010-12-29 ENCOUNTER — Ambulatory Visit (HOSPITAL_COMMUNITY): Payer: Self-pay

## 2010-12-29 ENCOUNTER — Ambulatory Visit (INDEPENDENT_AMBULATORY_CARE_PROVIDER_SITE_OTHER): Payer: PRIVATE HEALTH INSURANCE | Admitting: Cardiology

## 2010-12-29 ENCOUNTER — Encounter: Payer: Self-pay | Admitting: Cardiology

## 2010-12-29 DIAGNOSIS — E785 Hyperlipidemia, unspecified: Secondary | ICD-10-CM

## 2010-12-29 DIAGNOSIS — I2510 Atherosclerotic heart disease of native coronary artery without angina pectoris: Secondary | ICD-10-CM

## 2010-12-29 DIAGNOSIS — I10 Essential (primary) hypertension: Secondary | ICD-10-CM

## 2010-12-29 DIAGNOSIS — D682 Hereditary deficiency of other clotting factors: Secondary | ICD-10-CM

## 2010-12-29 LAB — CBC WITH DIFFERENTIAL/PLATELET
Basophils Relative: 1.1 % (ref 0.0–3.0)
Eosinophils Relative: 3.8 % (ref 0.0–5.0)
Hemoglobin: 13 g/dL (ref 13.0–17.0)
Lymphocytes Relative: 22 % (ref 12.0–46.0)
MCHC: 35 g/dL (ref 30.0–36.0)
Monocytes Relative: 0.8 % — ABNORMAL LOW (ref 3.0–12.0)
Neutro Abs: 2.8 10*3/uL (ref 1.4–7.7)
Neutrophils Relative %: 72.3 % (ref 43.0–77.0)
RBC: 4.47 Mil/uL (ref 4.22–5.81)
WBC: 3.9 10*3/uL — ABNORMAL LOW (ref 4.5–10.5)

## 2010-12-29 MED ORDER — PRASUGREL HCL 5 MG PO TABS: 5.0000 mg | ORAL_TABLET | Freq: Every day | ORAL | Status: DC

## 2010-12-29 NOTE — Assessment & Plan Note (Signed)
Remains on long term anitcoagulation

## 2010-12-29 NOTE — Patient Instructions (Addendum)
Your physician wants you to follow-up in: 6 MONTHS.  You will receive a reminder letter in the mail two months in advance. If you don't receive a letter, please call our office to schedule the follow-up appointment.  Your physician recommends that you have lab work today: CBC  Your physician has recommended you make the following change in your medication: DECREASE Prasugrel to 5mg  daily

## 2010-12-29 NOTE — Assessment & Plan Note (Signed)
Patient is being managed by Dr. Cato Mulligan.

## 2010-12-29 NOTE — Progress Notes (Signed)
HPI:  Patient is in for followup.  He has had no bleeding, and tolerated well.  He has had a throat tightening at times, but no chest pain and no shortness of breath.  He is stable at present.    Current Outpatient Prescriptions  Medication Sig Dispense Refill  . cetirizine (ZYRTEC) 10 MG tablet Take 10 mg by mouth daily.        . chlorthalidone (HYGROTON) 25 MG tablet Take 25 mg by mouth daily.        . citalopram (CELEXA) 20 MG tablet Take 20 mg by mouth daily.        . fish oil-omega-3 fatty acids 1000 MG capsule Take 3 g by mouth daily.       . hydrocortisone (ANUSOL-HC) 25 MG suppository Place 25 mg rectally at bedtime and may repeat dose one time.        . insulin detemir (LEVEMIR) 100 UNIT/ML injection Inject 60 Units into the skin 2 (two) times daily.        . insulin NPH (NOVOLIN N) 100 UNIT/ML injection Inject 100 Units into the skin. 30 units up to tid      . metFORMIN (GLUCOPHAGE) 1000 MG tablet        . olmesartan (BENICAR) 40 MG tablet Take 40 mg by mouth daily.        . prasugrel (EFFIENT) 10 MG TABS Take 10 mg by mouth daily.        . rosuvastatin (CRESTOR) 20 MG tablet Take 20 mg by mouth daily.        . traZODone (DESYREL) 50 MG tablet Take 50 mg by mouth at bedtime.        Marland Kitchen warfarin (COUMADIN) 5 MG tablet TAKE 1 TABLET BY MOUTH EVERY DAY AT BEDTIME  90 tablet  1  . DISCONTD: insulin detemir (LEVEMIR FLEXPEN) 100 UNIT/ML injection Inject 55 Units into the skin 2 (two) times daily.  30 mL  11  . DISCONTD: metFORMIN (GLUCOPHAGE) 1000 MG tablet TAKE 1 TABLET TWICE A DAY  180 tablet  2  . aspirin 81 MG tablet Take 81 mg by mouth daily.        Marland Kitchen glucose blood test strip 1 each by Other route 3 (three) times daily as needed. Use as instructed       . Insulin Pen Needle (B-D ULTRAFINE III SHORT PEN) 31G X 8 MM MISC by Does not apply route. As directed       . metoprolol (TOPROL-XL) 25 MG 24 hr tablet Take 25 mg by mouth daily.        . nitroGLYCERIN (NITROSTAT) 0.4 MG SL tablet  Place 0.4 mg under the tongue every 5 (five) minutes as needed.        . polyethylene glycol (GLYCOLAX) packet Take 17 g by mouth daily.        . Probiotic Product (ALIGN) 4 MG CAPS Take by mouth daily.          Allergies  Allergen Reactions  . Piroxicam     REACTION: unspecified: blistering on hands  . Rofecoxib     REACTION: Reaction not known    Past Medical History  Diagnosis Date  . HYPERLIPIDEMIA 04/12/2007  . HYPERTENSION 04/12/2007  . CORONARY ARTERY DISEASE 04/12/2007  . GERD 04/12/2007  . DIABETES MELLITUS, TYPE II, UNCONTROLLED 06/08/2007  . SLEEP APNEA, OBSTRUCTIVE 01/16/2009  . RENAL ARTERY STENOSIS 01/16/2009  . ESOPHAGEAL STRICTURE 01/16/2009  . PSORIASIS 01/16/2009  . PULMONARY EMBOLISM  01/20/2009  . 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 NEPHROLITHIASIS, HX OF 02/27/2009  . DEPRESSION 03/08/2009  . DYSPNEA 03/22/2010  . ANEMIA-UNSPECIFIED 04/28/2010  . DIABETES MELLITUS-TYPE II 09/01/2010  . Atherosclerosis of renal artery   . Coronary atherosclerosis of unspecified type of vessel, native or graft     s/p DES to RCA x2 in 2005; s/p DES to CFX 2009; s/p DES PTCA of RCA for  in stent restenosis 03/2010; s/p Promus DES x2 07/16/10(recurrent pain with abnormal Myoview oct 18,2011; residual at cath 07/16/10: LAD 30%; CFX 30%; RCA 30% ISR.    Past Surgical History  Procedure Date  . Knee arthroscopy 2007  . Rca stent     x2  . Angioplasty 2005 & 2011  . Coronary angioplasty with stent placement   . Esophagogastroduodenoscopy     Family History  Problem Relation Age of Onset  . Pulmonary embolism Mother   . Factor V Leiden deficiency Mother   . Diabetes Brother   . Heart failure Brother   . Hypertension Brother     History   Social History  . Marital Status: Married    Spouse Name: N/A    Number of Children: N/A  . Years of Education: N/A   Occupational History  . Not on file.   Social History Main Topics  . Smoking status: Never Smoker   . Smokeless tobacco: Not  on file  . Alcohol Use: No  . Drug Use: No  . Sexually Active:    Other Topics Concern  . Not on file   Social History Narrative  . No narrative on file    ROS: Please see the HPI.  All other systems reviewed and negative.  PHYSICAL EXAM:  BP 108/68  Pulse 80  Ht 6' (1.829 m)  Wt 267 lb (121.11 kg)  BMI 36.21 kg/m2  General: Well developed, well nourished, in no acute distress. Head:  Normocephalic and atraumatic. Neck: no JVD Lungs: Clear to auscultation and percussion. Heart: Normal S1 and S2.  No murmur, rubs or gallops.  Abdomen:  Normal bowel sounds; soft; non tender; no organomegaly Pulses: Pulses normal in all 4 extremities. Extremities: No clubbing or cyanosis. No edema. Neurologic: Alert and oriented x 3.  EKG:  NSr with borderline first degree av block. Left axis deviation  ASSESSMENT AND PLAN:

## 2010-12-29 NOTE — Assessment & Plan Note (Signed)
Well-controlled at the present time. ?

## 2010-12-29 NOTE — Assessment & Plan Note (Signed)
Patient had stent placed electively six months ago.  Given the data, and triple drug treatment, will decrease Effient to 5mg  per day for the next six months, then plan to stop.  Will check CBC.  Risks of bleed reviewed.  He understands all of this.

## 2010-12-31 ENCOUNTER — Other Ambulatory Visit: Payer: Self-pay | Admitting: Internal Medicine

## 2010-12-31 ENCOUNTER — Ambulatory Visit (HOSPITAL_COMMUNITY): Payer: Self-pay

## 2010-12-31 ENCOUNTER — Encounter (HOSPITAL_COMMUNITY): Payer: Self-pay

## 2011-01-01 ENCOUNTER — Emergency Department (HOSPITAL_COMMUNITY)
Admission: EM | Admit: 2011-01-01 | Discharge: 2011-01-01 | Disposition: A | Discharge status: Home / Self Care | Payer: PRIVATE HEALTH INSURANCE | Attending: Emergency Medicine | Admitting: Emergency Medicine

## 2011-01-01 ENCOUNTER — Emergency Department (HOSPITAL_COMMUNITY): Payer: PRIVATE HEALTH INSURANCE

## 2011-01-01 DIAGNOSIS — I2510 Atherosclerotic heart disease of native coronary artery without angina pectoris: Secondary | ICD-10-CM | POA: Insufficient documentation

## 2011-01-01 DIAGNOSIS — I10 Essential (primary) hypertension: Secondary | ICD-10-CM | POA: Insufficient documentation

## 2011-01-01 DIAGNOSIS — R079 Chest pain, unspecified: Secondary | ICD-10-CM | POA: Insufficient documentation

## 2011-01-01 DIAGNOSIS — E119 Type 2 diabetes mellitus without complications: Secondary | ICD-10-CM | POA: Insufficient documentation

## 2011-01-01 DIAGNOSIS — R0602 Shortness of breath: Secondary | ICD-10-CM | POA: Insufficient documentation

## 2011-01-01 DIAGNOSIS — Z79899 Other long term (current) drug therapy: Secondary | ICD-10-CM | POA: Insufficient documentation

## 2011-01-01 DIAGNOSIS — Z86718 Personal history of other venous thrombosis and embolism: Secondary | ICD-10-CM | POA: Insufficient documentation

## 2011-01-01 DIAGNOSIS — E789 Disorder of lipoprotein metabolism, unspecified: Secondary | ICD-10-CM | POA: Insufficient documentation

## 2011-01-01 DIAGNOSIS — Z794 Long term (current) use of insulin: Secondary | ICD-10-CM | POA: Insufficient documentation

## 2011-01-01 DIAGNOSIS — M79609 Pain in unspecified limb: Secondary | ICD-10-CM | POA: Insufficient documentation

## 2011-01-01 LAB — CK TOTAL AND CKMB (NOT AT ARMC): Relative Index: 2 (ref 0.0–2.5)

## 2011-01-01 LAB — DIFFERENTIAL
Basophils Absolute: 0.1 10*3/uL (ref 0.0–0.1)
Eosinophils Relative: 4 % (ref 0–5)
Lymphocytes Relative: 25 % (ref 12–46)
Lymphs Abs: 1 10*3/uL (ref 0.7–4.0)
Monocytes Absolute: 0 10*3/uL — ABNORMAL LOW (ref 0.1–1.0)
Monocytes Relative: 0 % — ABNORMAL LOW (ref 3–12)
Neutro Abs: 2.8 10*3/uL (ref 1.7–7.7)

## 2011-01-01 LAB — BASIC METABOLIC PANEL
BUN: 22 mg/dL (ref 6–23)
CO2: 21 mEq/L (ref 19–32)
Calcium: 9.6 mg/dL (ref 8.4–10.5)
GFR calc non Af Amer: >60 mL/min (ref >60)
Glucose, Bld: 190 mg/dL — ABNORMAL HIGH (ref 70–99)
Sodium: 131 mEq/L — ABNORMAL LOW (ref 135–145)

## 2011-01-01 LAB — CBC
HCT: 36.2 % — ABNORMAL LOW (ref 39.0–52.0)
Hemoglobin: 12.4 g/dL — ABNORMAL LOW (ref 13.0–17.0)
MCHC: 34.3 g/dL (ref 30.0–36.0)
MCV: 81.7 fL (ref 78.0–100.0)
RDW: 14.2 % (ref 11.5–15.5)

## 2011-01-01 LAB — TROPONIN I: Troponin I: 0.03 ng/mL (ref 0.00–0.06)

## 2011-01-01 LAB — PROTIME-INR: INR: 2.04 — ABNORMAL HIGH (ref 0.00–1.49)

## 2011-01-03 ENCOUNTER — Ambulatory Visit (HOSPITAL_COMMUNITY): Payer: Self-pay

## 2011-01-03 ENCOUNTER — Encounter (HOSPITAL_COMMUNITY): Payer: Self-pay

## 2011-01-03 LAB — BASIC METABOLIC PANEL
Calcium: 9.9 mg/dL (ref 8.4–10.5)
Chloride: 106 mEq/L (ref 96–112)
Creatinine, Ser: 1.67 mg/dL — ABNORMAL HIGH (ref 0.4–1.5)
GFR calc Af Amer: 52 mL/min — ABNORMAL LOW (ref >60)
Sodium: 137 mEq/L (ref 135–145)

## 2011-01-03 LAB — PROTIME-INR
INR: 1.9 — ABNORMAL HIGH (ref 0.00–1.49)
Prothrombin Time: 22.5 seconds — ABNORMAL HIGH (ref 11.6–15.2)

## 2011-01-03 LAB — CBC
Hemoglobin: 12.1 g/dL — ABNORMAL LOW (ref 13.0–17.0)
RBC: 4.21 MIL/uL — ABNORMAL LOW (ref 4.22–5.81)
WBC: 6.2 10*3/uL (ref 4.0–10.5)

## 2011-01-03 LAB — GLUCOSE, CAPILLARY
Glucose-Capillary: 172 mg/dL — ABNORMAL HIGH (ref 70–99)
Glucose-Capillary: 181 mg/dL — ABNORMAL HIGH (ref 70–99)
Glucose-Capillary: 216 mg/dL — ABNORMAL HIGH (ref 70–99)

## 2011-01-04 LAB — URINALYSIS, ROUTINE W REFLEX MICROSCOPIC
Bilirubin Urine: NEGATIVE
Glucose, UA: NEGATIVE mg/dL
Hgb urine dipstick: NEGATIVE
Ketones, ur: NEGATIVE mg/dL
Protein, ur: NEGATIVE mg/dL

## 2011-01-05 ENCOUNTER — Ambulatory Visit (HOSPITAL_COMMUNITY): Payer: Self-pay

## 2011-01-05 ENCOUNTER — Encounter (HOSPITAL_COMMUNITY): Payer: Self-pay

## 2011-01-05 LAB — PROTEIN C ACTIVITY: Protein C Activity: 170 % — ABNORMAL HIGH (ref 75–133)

## 2011-01-05 LAB — CBC
HCT: 36 % — ABNORMAL LOW (ref 39.0–52.0)
HCT: 39.2 % (ref 39.0–52.0)
Hemoglobin: 12.4 g/dL — ABNORMAL LOW (ref 13.0–17.0)
Hemoglobin: 13.5 g/dL (ref 13.0–17.0)
MCHC: 34.2 g/dL (ref 30.0–36.0)
MCHC: 34.3 g/dL (ref 30.0–36.0)
MCHC: 34.4 g/dL (ref 30.0–36.0)
MCHC: 34.6 g/dL (ref 30.0–36.0)
MCV: 83.7 fL (ref 78.0–100.0)
MCV: 84.4 fL (ref 78.0–100.0)
MCV: 85 fL (ref 78.0–100.0)
Platelets: 195 10*3/uL (ref 150–400)
RBC: 4.26 MIL/uL (ref 4.22–5.81)
RBC: 4.3 MIL/uL (ref 4.22–5.81)
RDW: 14.7 % (ref 11.5–15.5)
RDW: 14.8 % (ref 11.5–15.5)
RDW: 14.8 % (ref 11.5–15.5)
WBC: 4.2 10*3/uL (ref 4.0–10.5)

## 2011-01-05 LAB — POCT I-STAT, CHEM 8
Calcium, Ion: 1.22 mmol/L (ref 1.12–1.32)
Chloride: 106 mEq/L (ref 96–112)
HCT: 41 % (ref 39.0–52.0)
Hemoglobin: 13.9 g/dL (ref 13.0–17.0)
TCO2: 24 mmol/L (ref 0–100)

## 2011-01-05 LAB — HEPATIC FUNCTION PANEL
ALT: 35 U/L (ref 0–53)
AST: 33 U/L (ref 0–37)
Bilirubin, Direct: <0.1 mg/dL (ref 0.0–0.3)

## 2011-01-05 LAB — PROTHROMBIN GENE MUTATION

## 2011-01-05 LAB — GLUCOSE, CAPILLARY
Glucose-Capillary: 166 mg/dL — ABNORMAL HIGH (ref 70–99)
Glucose-Capillary: 172 mg/dL — ABNORMAL HIGH (ref 70–99)
Glucose-Capillary: 188 mg/dL — ABNORMAL HIGH (ref 70–99)
Glucose-Capillary: 192 mg/dL — ABNORMAL HIGH (ref 70–99)
Glucose-Capillary: 202 mg/dL — ABNORMAL HIGH (ref 70–99)
Glucose-Capillary: 213 mg/dL — ABNORMAL HIGH (ref 70–99)
Glucose-Capillary: 235 mg/dL — ABNORMAL HIGH (ref 70–99)
Glucose-Capillary: 283 mg/dL — ABNORMAL HIGH (ref 70–99)

## 2011-01-05 LAB — CARDIAC PANEL(CRET KIN+CKTOT+MB+TROPI)
CK, MB: 1.9 ng/mL (ref 0.3–4.0)
Relative Index: INVALID (ref 0.0–2.5)
Relative Index: INVALID (ref 0.0–2.5)
Total CK: 67 U/L (ref 7–232)
Troponin I: 0.02 ng/mL (ref 0.00–0.06)

## 2011-01-05 LAB — PROTEIN S ACTIVITY: Protein S Activity: 147 % — ABNORMAL HIGH (ref 69–129)

## 2011-01-05 LAB — PROTIME-INR
INR: 0.9 (ref 0.00–1.49)
Prothrombin Time: 12.5 seconds (ref 11.6–15.2)

## 2011-01-05 LAB — HEPARIN LEVEL (UNFRACTIONATED)
Heparin Unfractionated: 0.27 IU/mL — ABNORMAL LOW (ref 0.30–0.70)
Heparin Unfractionated: 0.38 IU/mL (ref 0.30–0.70)
Heparin Unfractionated: >2 IU/mL — ABNORMAL HIGH (ref 0.30–0.70)

## 2011-01-05 LAB — LUPUS ANTICOAGULANT PANEL
PTTLA 4:1 Mix: 72 secs — ABNORMAL HIGH (ref 36.3–48.8)
PTTLA Confirmation: 0.9 secs (ref <8.0)

## 2011-01-05 LAB — DIFFERENTIAL
Basophils Absolute: 0 10*3/uL (ref 0.0–0.1)
Basophils Relative: 0 % (ref 0–1)
Eosinophils Relative: 6 % — ABNORMAL HIGH (ref 0–5)
Lymphocytes Relative: 17 % (ref 12–46)
Monocytes Absolute: 0.1 10*3/uL (ref 0.1–1.0)

## 2011-01-05 LAB — LIPID PANEL
HDL: 23 mg/dL — ABNORMAL LOW (ref >39)
LDL Cholesterol: 52 mg/dL (ref 0–99)
Total CHOL/HDL Ratio: 6.1 RATIO
VLDL: 66 mg/dL — ABNORMAL HIGH (ref 0–40)

## 2011-01-05 LAB — BASIC METABOLIC PANEL
BUN: 17 mg/dL (ref 6–23)
Calcium: 9.3 mg/dL (ref 8.4–10.5)
Creatinine, Ser: 1 mg/dL (ref 0.4–1.5)
GFR calc Af Amer: >60 mL/min (ref >60)

## 2011-01-05 LAB — HOMOCYSTEINE: Homocysteine: 8.6 umol/L (ref 4.0–15.4)

## 2011-01-05 LAB — FACTOR 5 LEIDEN

## 2011-01-05 LAB — CARDIOLIPIN ANTIBODIES, IGG, IGM, IGA: Anticardiolipin IgG: <7 [GPL'U] — ABNORMAL LOW (ref <11)

## 2011-01-05 LAB — POCT CARDIAC MARKERS

## 2011-01-05 LAB — CK TOTAL AND CKMB (NOT AT ARMC): CK, MB: 1.8 ng/mL (ref 0.3–4.0)

## 2011-01-05 LAB — MAGNESIUM: Magnesium: 1.7 mg/dL (ref 1.5–2.5)

## 2011-01-05 LAB — APTT: aPTT: 35 seconds (ref 24–37)

## 2011-01-07 ENCOUNTER — Ambulatory Visit (HOSPITAL_COMMUNITY): Payer: Self-pay

## 2011-01-07 ENCOUNTER — Encounter (HOSPITAL_COMMUNITY): Payer: Self-pay

## 2011-01-10 ENCOUNTER — Encounter (HOSPITAL_COMMUNITY): Payer: Self-pay

## 2011-01-10 ENCOUNTER — Ambulatory Visit (HOSPITAL_COMMUNITY): Payer: Self-pay

## 2011-01-11 ENCOUNTER — Ambulatory Visit (INDEPENDENT_AMBULATORY_CARE_PROVIDER_SITE_OTHER): Payer: PRIVATE HEALTH INSURANCE | Admitting: Internal Medicine

## 2011-01-11 DIAGNOSIS — I2699 Other pulmonary embolism without acute cor pulmonale: Secondary | ICD-10-CM

## 2011-01-11 LAB — POCT INR: INR: 3.2

## 2011-01-11 NOTE — Patient Instructions (Signed)
Same dose 

## 2011-01-12 ENCOUNTER — Ambulatory Visit (HOSPITAL_COMMUNITY): Payer: Self-pay

## 2011-01-12 ENCOUNTER — Encounter (HOSPITAL_COMMUNITY): Payer: Self-pay

## 2011-01-14 ENCOUNTER — Ambulatory Visit (HOSPITAL_COMMUNITY): Payer: Self-pay

## 2011-01-14 ENCOUNTER — Encounter (HOSPITAL_COMMUNITY): Payer: Self-pay

## 2011-01-17 ENCOUNTER — Ambulatory Visit (HOSPITAL_COMMUNITY): Payer: Self-pay

## 2011-01-17 ENCOUNTER — Encounter (HOSPITAL_COMMUNITY): Payer: Self-pay

## 2011-01-18 ENCOUNTER — Other Ambulatory Visit: Payer: Self-pay | Admitting: *Deleted

## 2011-01-18 ENCOUNTER — Other Ambulatory Visit: Payer: Self-pay | Admitting: Internal Medicine

## 2011-01-18 DIAGNOSIS — I10 Essential (primary) hypertension: Secondary | ICD-10-CM

## 2011-01-19 ENCOUNTER — Encounter (HOSPITAL_COMMUNITY): Payer: Self-pay

## 2011-01-21 ENCOUNTER — Encounter (HOSPITAL_COMMUNITY): Payer: Self-pay

## 2011-01-24 ENCOUNTER — Telehealth: Payer: Self-pay | Admitting: *Deleted

## 2011-01-24 ENCOUNTER — Encounter (HOSPITAL_COMMUNITY): Payer: Self-pay

## 2011-01-24 NOTE — Telephone Encounter (Signed)
(  message on v/m) Pt is having h/a, cough, st, and runny nose.  He would like something called in, he thinks he has a sinus infection

## 2011-01-25 ENCOUNTER — Encounter: Payer: Self-pay | Admitting: Family Medicine

## 2011-01-25 ENCOUNTER — Ambulatory Visit (INDEPENDENT_AMBULATORY_CARE_PROVIDER_SITE_OTHER): Payer: PRIVATE HEALTH INSURANCE | Admitting: Family Medicine

## 2011-01-25 ENCOUNTER — Telehealth: Payer: Self-pay | Admitting: *Deleted

## 2011-01-25 VITALS — BP 120/80 | Temp 98.4°F | Wt 270.0 lb

## 2011-01-25 DIAGNOSIS — R609 Edema, unspecified: Secondary | ICD-10-CM

## 2011-01-25 DIAGNOSIS — B349 Viral infection, unspecified: Secondary | ICD-10-CM

## 2011-01-25 DIAGNOSIS — B9789 Other viral agents as the cause of diseases classified elsewhere: Secondary | ICD-10-CM

## 2011-01-25 MED ORDER — FUROSEMIDE 20 MG PO TABS: 20.0000 mg | ORAL_TABLET | Freq: Every day | ORAL | Status: DC

## 2011-01-25 MED ORDER — HYDROCODONE-HOMATROPINE 5-1.5 MG/5ML PO SYRP
5.0000 mL | ORAL_SOLUTION | Freq: Four times a day (QID) | ORAL | Status: AC | PRN
Start: 2011-01-25 — End: 2011-02-04

## 2011-01-25 NOTE — Patient Instructions (Addendum)
Viral Infections Your caregiver has diagnosed you or your child with a viral infection. Viruses are the frequent causes of minor sore throats, aches and pains, runny noses, watery eyes, tiredness, some coughs, and gastrointestinal infections causing nausea (feeling sick to your stomach), vomiting, and diarrhea. These problems (symptoms) DO NOT respond to medications that kill germs (antibiotics). HOME CARE INSTRUCTIONS  Only take over-the-counter or prescription medicines for pain, discomfort, diarrhea or fever as directed by your caregiver.   Drink clear fluids.  SIGNS OF A BACTERIAL (GERM) INFECTION FOLLOWING THE VIRUS MAY BE:  Soreness of throat with pus and difficulty swallowing.   Swollen neck glands.   Chills and a fever.   Severe headache.   Tenderness over sinuses.   Persistent extreme malaise (feeling unhealthy), muscle aches and fatigue (tiredness).   Persistent productive cough.   Any yellow, green or brown mucus production with coughing.  SEEK IMMEDIATE MEDICAL CARE IF:  You or your child has severe headaches, shortness of breath, chest or neck pain or an unusual rash.   You or your child has an oral temperature above 102 F (38.9 C), not controlled by medicine.   Your baby is older than 3 months with a rectal temperature of 102 F (38.9 C) or higher.   Your baby is 55 months old or younger with a rectal temperature of 100.4 F (38 C) or higher.  You can receive the resource "A Common Sense Guide to Antibiotics" free by calling 1-800-LUNG-USA (563)111-4444). MAKE SURE YOU:   Understand these instructions.   Will watch your condition.   Will get help right away if you are not doing well or get worse.  Document Released: 06/22/2005 Document Re-Released: 03/02/2010 [facility] Patient Information 2011 [place], [place].  Take Furosemide in place of the chlorthalidone for the next week.

## 2011-01-25 NOTE — Telephone Encounter (Signed)
Pt thinks he has a sinus infection.  He has a h/a, runny nose, st, cough  Would like Dr Cato Mulligan to call in an antibotic

## 2011-01-25 NOTE — Progress Notes (Signed)
Subjective:    Patient ID: [name], male    DOB: 1951/01/12, 60 y.o.   MRN: [id number]  Cough Associated symptoms include ear pain, postnasal drip and shortness of breath. Pertinent negatives include no chest pain, chills, fever, headaches, rash or wheezing.   Patient is seen with respiratory illness that started about 4 days ago. Initially had sore throat followed by laryngitis symptoms. Nonspecific symptoms and dry cough. No fever or chills. Over-the-counter Mucinex without much relief. Poor sleep quality secondary to cough. Patient is nonsmoker. Denies any nausea, vomiting, or diarrhea. Some mild right ear pain and feels pressure over maxillary sinuses.  Some increased dyspnea with activity recently. No chest pain. Increased fluid retention hands and feet. Takes chlorthalidone 25 mg. Has previously been on furosemide for brief periods. No orthopnea.  Patient states 8 pound weight gain by his scales of the past week or so.  Hx OSA on CPAP.  Past Medical History  Diagnosis Date  . HYPERLIPIDEMIA 04/12/2007  . HYPERTENSION 04/12/2007  . CORONARY ARTERY DISEASE 04/12/2007  . GERD 04/12/2007  . DIABETES MELLITUS, TYPE II, UNCONTROLLED 06/08/2007  . SLEEP APNEA, OBSTRUCTIVE 01/16/2009  . RENAL ARTERY STENOSIS 01/16/2009  . ESOPHAGEAL STRICTURE 01/16/2009  . PSORIASIS 01/16/2009  . PULMONARY EMBOLISM 01/20/2009  . 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 NEPHROLITHIASIS, HX OF 02/27/2009  . DEPRESSION 03/08/2009  . DYSPNEA 03/22/2010  . ANEMIA-UNSPECIFIED 04/28/2010  . DIABETES MELLITUS-TYPE II 09/01/2010  . Atherosclerosis of renal artery   . Coronary atherosclerosis of unspecified type of vessel, native or graft     s/p DES to RCA x2 in 2005; s/p DES to CFX 2009; s/p DES PTCA of RCA for  in stent restenosis 03/2010; s/p Promus DES x2 07/16/10(recurrent pain with abnormal Myoview oct 18,2011; residual at cath 07/16/10: LAD 30%; CFX 30%; RCA 30% ISR.   Past Surgical History  Procedure Date  . Knee  arthroscopy 2007  . Rca stent     x2  . Angioplasty 2005 & 2011  . Coronary angioplasty with stent placement   . Esophagogastroduodenoscopy     reports that he 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 He does not have any smokeless tobacco history on file. He reports that he does not drink alcohol or use illicit drugs. family history includes Diabetes in his brother; Factor V Leiden deficiency in his mother; Heart failure in his brother; Hypertension in his brother; and Pulmonary embolism in his mother. Allergies  Allergen Reactions  . Piroxicam     REACTION: unspecified: blistering on hands  . Rofecoxib     REACTION: Reaction not known      Review of Systems  Constitutional: Positive for fatigue and unexpected weight change. Negative for fever and chills.  HENT: Positive for ear pain, congestion, postnasal drip and sinus pressure. Negative for trouble swallowing.   Respiratory: Positive for cough and shortness of breath. Negative for wheezing.   Cardiovascular: Positive for leg swelling. Negative for chest pain and palpitations.  Gastrointestinal: Negative for abdominal pain.  Skin: Negative for rash.  Neurological: Negative for headaches.  Hematological: Negative for adenopathy. Does not bruise/bleed easily.       Objective:   Physical Exam  Constitutional: He is oriented to person, place, and time. He appears well-developed and well-nourished.  HENT:  Head: Normocephalic and atraumatic.  Right Ear: External ear normal.  Left Ear: External ear normal.  Mouth/Throat: Oropharynx is clear and moist. No oropharyngeal exudate.  Neck: Neck supple.  Cardiovascular: Normal rate, regular rhythm and normal  heart sounds.   No murmur heard. Pulmonary/Chest: Effort normal and breath sounds normal. No respiratory distress. He has no wheezes. He has no rales.  Musculoskeletal: He exhibits edema.       Patient has trace to 1+ edema lower legs bilaterally  Lymphadenopathy:    He has no cervical adenopathy.    Neurological: He is alert and oriented to person, place, and time.  Skin: No rash noted.          Assessment & Plan:  #1 acute viral syndrome. Hycodan for cough suppression. Followup promptly for fever or change in symptoms #2 increased edema. No evidence for overt heart failure. Add back furosemide 20 mg daily and followup with primary physician in 2 weeks to reassess. He does have history of mild low sodium 131 last visit and consider reassess at follow-up.

## 2011-01-25 NOTE — Telephone Encounter (Signed)
Mucinex DM one by mouth twice a day for 10 days.

## 2011-01-25 NOTE — Telephone Encounter (Signed)
Left message on pt's personal voicemail 

## 2011-01-26 ENCOUNTER — Encounter (HOSPITAL_COMMUNITY): Payer: Self-pay

## 2011-01-26 MED ORDER — DOXYCYCLINE HYCLATE 100 MG PO TABS: 100.0000 mg | ORAL_TABLET | Freq: Two times a day (BID) | ORAL | Status: AC

## 2011-01-26 NOTE — Telephone Encounter (Signed)
Notified pt. 

## 2011-01-26 NOTE — Telephone Encounter (Signed)
Doxycycline 100 mg by mouth twice a day for 7 days.

## 2011-01-27 NOTE — Consult Note (Signed)
[name], [name]               ACCOUNT NO.:  [id number]  MEDICAL RECORD NO.:  [id number]           PATIENT TYPE:  E  LOCATION:  [facility]                         FACILITY:  [facility]  PHYSICIAN:  Bettey Mare. [place], NPDATE OF BIRTH:  02-Feb-1951  DATE OF CONSULTATION:  01/01/2011 DATE OF DISCHARGE:                                CONSULTATION   REASON FOR CONSULTATION:  Recurrent chest pain in the setting of known coronary artery disease.  PRIMARY CARDIOLOGIST:  Arturo Morton. Riley Kill, MD, [facility]  PRIMARY CARE PHYSICIAN:  Valetta Mole. Swords, MD  HISTORY OF PRESENT ILLNESS:  This is a 60 year old obese Caucasian male with known history of CAD with stents to the right coronary artery and circumflex with restenosis of the right coronary artery stent and drug- eluting stent placement in 2009 secondary to this.  He has also recently had an angioplasty in July 2011 for RCA in-stent restenosis as well.  He was last seen by Dr. Riley Kill on December 29, 2010, at which time Dr. Riley Kill had planned to decrease his Effient dose from 10 mg to 5 mg daily in order to wean him from it as he continued to be on Coumadin.  The patient has a history of factor V deficiency.  The patient apparently had had some chest tightness and upper chest tightness at the top of the sternum which have been coming and going. He did express this to Dr. Riley Kill on his last office visit and was told to watch it for any changes, recurrences, or more severity.  The patient normally uses a CPAP and had taken off the CPAP when he awoke this morning, got up and walked around and began to have some chest tightness and also tightness at the top of the sternum.  He did have some radiation to the left arm which was very transient.  It went away on its own.  It did return home later that morning and he did take a sublingual nitroglycerin which did not help and it only gave him a headache.  The patient works as a Surveyor, mining and decided to go  ahead and appraise a house.  He states it was a 2-story house and walked up stairs and he did not have any issues with that, recurrence of chest pain, or shortness of breath.  However, afterwards when he came home doing paperwork, he had recurrence of the discomfort and then began to have a clammy feeling and some queasiness.  As a result of this as a known history, he came to the emergency room to be evaluated.  Currently, the patient has been given aspirin.  He has been placed on nitroglycerin drip and is comfortable without any recurrence of discomfort.  REVIEW OF SYSTEMS:  Positive for chest tightness and tightness at the base of the throat, queasy feeling, some left arm discomfort, and clammy feeling.  All other systems were reviewed and found to be negative.  CODE STATUS:  Full code.  PAST MEDICAL HISTORY: 1. Insulin-dependent diabetes. 2. Hyperlipidemia. 3. Morbid obesity. 4. Obstructive sleep apnea, wearing CPAP at night. 5. Depression. 6. CAD. c:  Status post  successful Promus drug-eluting stent overlapping of the right coronary artery in October 2011 in the setting of abnormal stress Myoview. 1. Status post balloon angioplasty of the right coronary artery in     July 2011 secondary to in-stent restenosis. 2. Status post drug-eluting stent to the circumflex in 2009. 3. Status post drug-eluting stent to the right coronary artery in     2005. 4. Factor V Leiden deficiency. a:  Chronic Coumadin therapy. b:  History of pulmonary embolism. 1. Dyslipidemia. 2. Anemia. 3. Most recent cardiac catheterization in October 2011 with LAD 30%,     circumflex 30%, right coronary artery with 30% in-stent restenosis,     ejection fraction of 55-60%.  PAST SURGICAL HISTORY:  Knee arthroscopy.  SOCIAL HISTORY:  He lives in [place] his wife.  He is a Surveyor, mining.  He is married.  He does not drink, smoke, or use drugs.  FAMILY HISTORY:  Mother with valve disease and  COPD.  She is a patient of Dr. Fabio Bering.  Father who is in good health.  Siblings, brother is deceased from CAD, sister who is healthy.  CURRENT MEDICATIONS PRIOR TO ADMISSION: 1. Effient 5 mg. 2. Insulin per sliding scale. 3. Also insulin Levemir FlexPen. 4. Aspirin 81 mg daily. 5. Zyrtec 10 mg daily. 6. Chlorthalidone 25 mg daily. 7. Fish oil 3 g daily. 8. Anusol at bedtime. 9. Metoprolol 25 mg daily. 10.Nitroglycerin sublingual 0.4 mg p.r.n. 11.Benicar 40 mg daily. 12.Crestor 20 mg daily. 13.Coumadin per [facility]. 14.Trazodone 50 mg at bedtime. 15.Align 40 mg daily.  ALLERGIES: 1. PIROXICAM. 2. ROFECOXIB.  CURRENT LABORATORY DATA:  Sodium 131, potassium 4.5, chloride 99, CO2 of 21, BUN 22, creatinine 0.85, and glucose 190.  Hemoglobin 12.4, hematocrit 36.2, white blood cells 4.0, and platelets 225.  Troponin 0.03.  PT 23.2 and INR 2.0.  EKG revealing sinus rhythm with no acute EKG changes, rate of 72 beats per minute.  Chest x-ray:  Low lung volumes with bilateral bibasilar atelectasis.  PHYSICAL EXAMINATION:  VITAL SIGNS:  Blood pressure 100/58, pulse 72, respirations 18, temperature 98.0, and O2 sat 94% on room air. GENERAL:  He is awake, alert, and oriented, in no acute distress. HEENT:  Head is normocephalic and atraumatic. NECK:  Obese, supple.  No thyromegaly.  No carotid bruit.  No JVD. CARDIOVASCULAR:  Distant heart sounds.  Regular rate and rhythm without murmurs, rubs, or gallops.  Pulses are 2+ and equal without bruits. LUNGS:  Clear to auscultation without wheezes, rales, or rhonchi. ABDOMEN:  Obese and nontender, with 2+ bowel sounds. EXTREMITIES:  Without clubbing, cyanosis, or edema. NEURO:  Cranial nerves II-XII are grossly intact. PSYCH:  He has a very good affect in a friendly manner.  IMPRESSION:  Recurrent chest pain in the setting of known history of coronary artery disease with multiple interventions to right coronary artery and  circumflex with typical angina symptoms, but slightly less severe with a negative troponin and EKG at present.  I believe this to be noncardiac chest pain.  The patient knows this are anginal symptoms and this is different.  We will discharge from ER.  Acute coronary syndrome/angina has been reviewed with the patient and had a respond. He will continue to follow Dr. Riley Kill as an outpatient and also Dr. Cato Mulligan.     Bettey Mare. Lyman Bishop, NP     KML/MEDQ  D:  01/01/2011  T:  01/02/2011  Job:  [id number]  cc:   Valetta Mole. Swords, MD  Electronically Signed by Joni Reining NP on 01/03/2011 11:15:28 AM Electronically Signed by Valera Castle MD [facility] on 01/27/2011 08:16:33 AM

## 2011-01-28 ENCOUNTER — Encounter (HOSPITAL_COMMUNITY): Payer: Self-pay

## 2011-01-31 ENCOUNTER — Encounter (HOSPITAL_COMMUNITY): Payer: Self-pay

## 2011-02-02 ENCOUNTER — Encounter (HOSPITAL_COMMUNITY): Payer: Self-pay

## 2011-02-04 ENCOUNTER — Encounter (HOSPITAL_COMMUNITY): Payer: Self-pay

## 2011-02-07 ENCOUNTER — Encounter (HOSPITAL_COMMUNITY): Payer: Self-pay

## 2011-02-08 ENCOUNTER — Other Ambulatory Visit: Payer: Self-pay | Admitting: Internal Medicine

## 2011-02-08 NOTE — Assessment & Plan Note (Signed)
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   He is back in today.  He was initially trialed on Neurontin and was  titrating up on his medications; however, he came back and states he  stopped his Neurontin because it was not helping.  Today, we had planned  to increase the dose if he felt that was not helping that much.  However, he has discontinued his medications.   His average pain is about 6 on a scale of 10.  He states overall he has  been doing better this last month.  Sleep is fair.  He gets fair relief  from the meds at this time.   He is able to walk about 10 minutes at a time.  He is able to climb  stairs and drive.  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  He has started a pool  therapy program as well.  He has gone about 2-3 times since I saw him  last.   Denies problems controlling bowel or bladder.  Denies depression or  anxiety.  Denies suicidal ideation.   REVIEW OF SYSTEMS:  Otherwise, noncontributory.   No changes in past medical, social, or family history since our last  visit as well.   MEDICATIONS:  Prescribed by this clinic include Neurontin 300 mg 1 p.o.  t.i.d.  However, he has discontinued his medications.  We will need to  re-titrate back up again.   PHYSICAL EXAMINATION:  Today, blood pressure is 154/81, pulse 80,  respirations 20, and O2 saturation is 95%.  He is an obese gentleman who does not appear in any distress.  He is  oriented x3.  Speech is clear.  Affect is bright.  He is alert,  cooperative, and pleasant.  Follows commands without any difficulty.   Transitioning from sitting to standing is done with ease.  Gait in the  room is  stable and nonantalgic.  Tandem gait and Romberg test are all  performed adequately.   Limitations in lumbar motion in all planes are noted.  Sensation is  intact in lower extremities.  Reflexes are 1+.  Motor strength is 5/5  without focal deficits.   Limitations are noted in cervical range of motion in all planes as well.   IMPRESSION:  1. Cervicalgia.  2. Lumbago.  3. Bilateral lower extremity pain, which is dull in nature and      constant.   PLAN:  We will restart Neurontin 400 mg 1 p.o. b.i.d. titrating up to  400 q.i.d. over the next couple of weeks.  He will call me in 2 weeks  and let me know if he would like to increase it further.  We will see  him back in a month, otherwise.  I encouraged him to engage in his pool  program, and I have also prescribed some physical therapy for  him to address his bilateral trochanteric bursitis.  He continues to  have tenderness over the trochanters bilaterally.  We will see him back  in a month.           ______________________________  Brantley Stage, M.D.     DMK/MedQ  D:  03/07/2008 12:36:41  T:  03/08/2008 07:09:46  Job #:  [id number]

## 2011-02-08 NOTE — H&P (Signed)
NAMETETSUO, COPPOLA NO.:  [id number]   MEDICAL RECORD NO.:  [id number]          PATIENT TYPE:  INP   LOCATION:  1823                         FACILITY:  [facility]   PHYSICIAN:  Unice Cobble, MD     DATE OF BIRTH:  [birth year]   DATE OF ADMISSION:  07/10/2008  DATE OF DISCHARGE:                              HISTORY & PHYSICAL   CARDIOLOGIST:  Dr. Bonnee Quin in the [facility].   CHIEF COMPLAINTS:  Chest pain.   HISTORY OF PRESENT ILLNESS:  This is a 60 year old white male with a  history of coronary artery disease status post PCI x2 who presents with  chest pain.  The patient felt cold at dinner tonight and had anorexia.  Unable to eat, he went to his car at approximately 1800, and had onset  of substernal chest pressure radiating to his left arm and left neck  associated with shortness of breath.  No nausea or diaphoresis.  These  symptoms lasted approximately 30 minutes very intensely and then  subsided.  It is now approximately 1/10.  He had mild presyncope when  given nitroglycerin but his chest pain decreased in severity.  He tells  me that he has had shortness of breath with exertion lately,  specifically with stairs.  He denies orthopnea, PND, or edema.  His last  myocardial infarction presented with substernal chest pain but radiation  down his right arm.   PAST MEDICAL HISTORY:  1. Coronary artery disease status post non-ST elevation MI and PCI x2      in [place], [place].  2. Diabetes mellitus.  3. Hypertension.  4. Dyslipidemia.  5. Obesity.  6. Psoriatic arthritis as a child.  7. Lumbago.  8. Neck pain.  9. Left kidney stone status post ureteral stent, June 11, 2008,      with change out of ureteral stent on June 23, 2008.  10.Obstructive sleep apnea.  11.Last Myoview was done in September 2008 which showed an ejection      fraction of 53% without ischemic deficits.   ALLERGIES:  FELDENE.   MEDICATIONS:  1. Calcium  citrate.  2. Hydrocodone p.r.n.  3. Red yeast 600 mg daily.  4. Actos/metformin 15/500 mg b.i.d.  5. Aspirin 81 mg daily.  6. Benicar 40 mg daily.  7. Crestor 20 mg daily.  8. Metoprolol tartrate 25 mg daily.  9. Nexium 40 mg daily.  10.Plavix 75 mg daily.  11.Zetia 10 mg daily.  12.Fish oil 1200 mg daily.  13.Levemir 40 units subcu q.h.s.  14.Uroxatral 10 mg daily.  15.Lexapro 10 mg daily.   SOCIAL HISTORY:  Lives in [place]  with his wife.  Real Administrator.  No tobacco or alcohol.   FAMILY HISTORY:  There is no evidence of coronary artery disease on his  mother's or father's side.   REVIEW OF SYSTEMS:  The patient has kidney stones which he is being  treated for with ureteral stents which gives him pain from now and again  which he takes hydrocodone for.  He took hydrocodone today and had not  done  so for the previous 5 days.  He is not experiencing any urine in  his blood right now.  The remainder of the review systems was found to  be negative except as stated here and in the HPI.   PHYSICAL EXAM:  He is afebrile with a pulse of 95.  Respiratory rate is  24.  Blood pressure is 92/50 with O2 sats of 98% on 2 L.  GENERAL:  He is in no acute distress.  HEENT:  Shows NCAT, PERRLA, EOMI, MMM, dentition good, oropharynx  without erythema or exudates.  NECK:  Supple without lymphadenopathy, thyromegaly, bruits, or jugular  venous distention.  HEART:  Has a regular rate and rhythm.  Tachycardiac.  S1 and S2 are  normal.  No murmurs, gallops, or rubs.  Normal PMI.  Pulses 2+ and equal  bilaterally without bruits.  LUNGS:  Clear to auscultation bilaterally.  SKIN:  No rashes or lesions.  ABDOMEN:  Soft with normal bowel sounds and no rebound or guarding.  He  does have some mild right upper quadrant pain.  EXTREMITIES:  Show no cyanosis or clubbing.  He has 1+ edema  bilaterally.  MUSCULOSKELETAL:  Shows no joint deformity, effusions, or spine or CVA  tenderness.   NEUROLOGICALLY:  He is alert and oriented x3 with cranial nerves II-XII  grossly intact.  Strength is 5/5, all extremities and axial groups.  Normal sensation throughout.   RADIOLOGY:  His chest x-ray shows no acute cardiopulmonary disease.  EKG  shows a rate of 103 and sinus tachycardia.  He has left anterior  fascicular block with left axis deviation.  First-degree AV block.  Left  atrial enlargement.  No ischemic changes.   LABS:  So far show a hemoglobin of 12.2 with a creatinine of 1.1 and a  glucose of 342.  His troponin and CK-MB are negative.   ASSESSMENT AND PLAN:  This is a 60 year old white male with a history of  coronary disease who presents with chest pain.  1. Chest pain.  Patient has atypical to typical angina, having onset      with exertion and decreased with nitroglycerin but not relieved by      rest.  He has no ECG changes concerning for ischemia at this time.      However, I am concerned enough with his story to start heparin but      not a 2b3a inhibitor.  He will be admitted to rule out myocardial      infarction under the assumption that this is unstable angina.  His      urine will be monitored for RBCs in light of ureteral stenting.      His home medications will otherwise be continued.  2. Diabetes mellitus.  He will get half-dose insulin tonight with      insulin sliding scale coverage.  Metformin will be stopped in case      any imaging needs to be done.  3. Lower extremity edema.  I am uncertain whether this is due to some      small part of heart failure (doubtful as he      has no jugular venous distention) or is secondary to medication      use, i.e., Actos.  This will need to be kept in mind as his      hospitalization continues.  4. Ureteral stents:  Check UA for infection.  Check CBC with      differential.  Unice Cobble, MD  Electronically Signed     ACJ/MEDQ  D:  07/10/2008  T:  07/10/2008  Job:  [id number]

## 2011-02-08 NOTE — Assessment & Plan Note (Signed)
[facility]                         GASTROENTEROLOGY OFFICE NOTE   Adam, [name]                        MRN:          [id number]  DATE:02/21/2007                            DOB:          August 06, 1951    This very nice gentleman was referred by Dr. Birdie Sons because of  occasion of some bright red blood per rectum, this is not very often,  otherwise he is doing well.  Basically been in good health.  He was  being referred for colonoscopic examination.  His last one was in 2004  and he had several small polyps, as he did the time prior.  He also had  some GERD, chronic gastritis on previous endoscopies.  Otherwise has  been doing well.  Has no family history of significance.   PAST MEDICAL HISTORY:  1. Two stents in 2005.  2. Diabetes.  3. Hypercholesterolemia.  4. Arthritis.  5. Kidney stones.  6. Allergies.  7. Sleep apnea.   SOCIAL HISTORY:  He is still working.  Does not drink or smoke.   REVIEW OF SYSTEMS:  Reveals some swelling of his feet, some coughing,  some sleep difficulties, back pain, and itching.   PHYSICAL EXAMINATION:  He is a healthy-appearing man, overweight.  Weight 273, blood pressure 130/78, pulse 78 and regular.  OROPHARYNX:  Negative.  NECK:  Negative.  SUPRACLAVICULAR:  Area was negative.  CHEST:  Clear.  HEART:  Revealed a regular rate and rhythm without significant murmur.  ABDOMEN:  Soft and without organomegaly, he has a ventral hernia.  RECTAL:  Deferred.  EXTREMITIES:  Unremarkable.   IMPRESSION:  1. Rectal bleeding with a history of multiple colon polyps.  2. Diabetes.  3. Moderate obesity.  4. Arteriosclerotic cardiovascular disease, status post stents.   RECOMMENDATIONS:  Please schedule for colonoscopic examination sometime  in the very near future.     Adam Mort, MD  Electronically Signed    SML/MedQ  DD: 02/21/2007  DT: 02/21/2007  Job #: [id number]   cc:   Valetta Mole. Swords, MD

## 2011-02-08 NOTE — H&P (Signed)
NAMESHEPHERD, [name]               ACCOUNT NO.:  [id number]   MEDICAL RECORD NO.:  [id number]          PATIENT TYPE:  INP   LOCATION:  1826                         FACILITY:  [facility]   PHYSICIAN:  Doylene Canning. Ladona Ridgel, MD    DATE OF BIRTH:  11-30-1950   DATE OF ADMISSION:  06/23/2007  DATE OF DISCHARGE:                              HISTORY & PHYSICAL   ADMISSION DIAGNOSIS:  Chest pain.   HISTORY OF PRESENT ILLNESS:  The patient is a 60 year old male with a  history of coronary disease status post stenting in the past, most  recently with catheterization in August 2005.  At that time, he had  previously had a stent placed for known non-STEMI.  His LV function was  preserved.  He was in his usual state of health until this morning when  he awoke with substernal chest pressure and choking sensation.  He notes  that his symptoms were different than what he had with a prior Mis, and  he had no right-sided arm pain which is what his typical anginal  equivalent was associated with.  In the emergency department, he got a  GI cocktail, and his pain resolved.  He did not experience symptoms with  exertion, and has had no exertional symptoms recently.  He does note  that his discomfort is slightly worse with deep inspiration.  Otherwise,  no specific complaints, but there was no alleviating nor exacerbating  factors.  His pain persisted until he came to emergency department as  previously noted.   ADDITIONAL PAST MEDICAL HISTORY:  1. Diabetes.  2. Hypertension.  3. Dyslipidemia .  4. Obesity.  5. Remote history of psoriatic arthritis, and is on ibuprofen      presently.  He does not have much in the way of skin manifestations      from arthritis.   SOCIAL HISTORY:  The patient is married and lives in [place].  He  works as a Technical sales engineer.  He denies tobacco or ethanol use.   MEDICATIONS:  1. Glucophage 1 gram twice daily.  2. Lopressor 25 twice daily.  3. Nexium 40 mg daily.  4. Plavix 75 daily.  5. Lisinopril 28-day.  6. Aspirin 81 a day.  7. Lexapro 100 a day.  8. Simvastatin 40-day.  9. Glyburide and Lantus insulin 20 a day.  10.Ibuprofen 200 mg four tablets three times a day.  11.Zyrtec 10 mg daily.   FAMILY HISTORY:  Notable for both parents being living and no coronary  disease.   REVIEW OF SYSTEMS:  Review of systems negative, notes occasional  headache.  He has 2-3 weeks of recent cold and rhinitis.  He denies any  skin problems except for very mild psoriasis.  He does have a cough with  occasional productive sputum, and he also notes occasional dizziness and  lower extremity edema when he has been upright for some time and when he  has eaten too much sodium.  He has generalized arthralgias.  He has  reflux symptoms.  Otherwise, all the other systems were reviewed and  found to be negative.  PHYSICAL EXAMINATION:  GENERAL:  He is a pleasant obese middle-aged man  in no distress.  VITAL SIGNS:  Blood pressure 156/82, the pulse 66 and regular,  respirations 20, temperature is 98.3.  HEENT:  Normocephalic, atraumatic.  Pupils equal, round.  Oropharynx  moist.  Sclerae anicteric.  NECK:  Revealed no jugular distention.  There was no thyromegaly.  Trachea was midline.  Carotids were 2+ and symmetric.  LUNGS:  Clear bilaterally auscultation.  No wheezes, rales or rhonchi  are present.  No increased work of breathing.  CARDIOVASCULAR:  Regular rhythm.  Normal S1-S2.  The heart sounds were  somewhat distant.  There were no murmurs, rubs or gallops that I could  appreciate.  ABDOMEN:  Soft, nontender, nondistended.  No organomegaly.  The bowel  sounds present.  There is no rebound or guarding.  EXTREMITIES:  Demonstrated no cyanosis, clubbing, edema.  Pulses 2+ and  symmetric.  NEUROLOGIC:  Alert and oriented x3 with cranial nerves intact.  Strength  is 5/5 and symmetric.   STUDIES:  The EKG demonstrates sinus rhythm at 66 beats per minute.   There are no acute ST-T wave changes.   LABORATORY DATA:  Labs note demonstrated negative initial point of care  cardiac markers.  Hemoglobin was 12.9 and hematocrit was 0.7, glucose  was 202, potassium was 5.4.   IMPRESSION:  1. Chest pain with typical and atypical features, mostly atypical, now      relieved with nitroglycerin.  2. Known coronary disease status post non-STEMI in the past with      stenting.  3. Hypertension.  4. Diabetes.  5. Dyslipidemia.   DISCUSSION:  I have recommended that the patient be admitted for  observation.  If he rules out and he is pain free, then we will send him  home early with outpatient stress Myoview.  If the patient has recurrent  chest pain, or his cardiac enzymes are abnormal, then catheterization  would be warranted.      Doylene Canning. Ladona Ridgel, MD  Electronically Signed     GWT/MEDQ  D:  06/23/2007  T:  06/24/2007  Job:  [id number]   cc:   Arturo Morton. Riley Kill, MD, [facility]  Bruce H. Swords, MD

## 2011-02-08 NOTE — Assessment & Plan Note (Signed)
[facility]                            CARDIOLOGY OFFICE NOTE   [name], [name]                      MRN:          [id number]  DATE:06/28/2007                            DOB:          12/20/1950    [name] is in for follow up. He was recently admitted to the hospital  after he developed a heaviness in his chest. He has gained quite a bit  of weight. He went to the emergency room where he received a GI cocktail  with relief of his symptoms. Subsequent to that, he was admitted for a  rule out. He was noted to have a hemoglobin that was borderline at 12.5,  but his MCV was normal at 83. His Prothine was 12. His BUN and creatine  were normal and his potassium was slightly elevated. His enzymes were  negative x3. His TSH was normal. The patient had also had a recent MRI  of the side discomfort area and that was completely normal as well. His  chest x-ray was done while he was in the hospital and revealed linear  marking at the left side. He subsequently has returned for a myocardial  perfusion imaging study that was done with adenosine. This revealed no  perfusion defect. His ejection fraction however was 53%, but without a  perfusion deficit. He has had no more symptoms whatsoever. He feels  relatively well. He has gained quite a bit of weight and has not been  all that active, and he would like to get back into rehab.   MEDICATIONS:  1. Fish oil b.i.d.  2. Metoprolol 25 mg b.i.d.  3. Actos plus b.i.d.  4. Nexium 40 mg daily.  5. Lexapro 10 mg daily.  6. Aspirin 81 mg daily.  7. Plavix 75 mg daily.  8. Multivitamin daily.  9. Lisinopril 20 mg daily.  10.Glyburide 5 mg daily.  11.Lantus 20 units every morning.  12.Simvastatin 40 mg daily.  13.Carafate.   Of note, the patient has been taking fairly non-stop Motrin. He asked  about a non-steroidal today and we reviewed this in some detail.   PHYSICAL EXAMINATION:  VITAL SIGNS:  Blood pressure  130/78, pulse 72.  LUNGS:  Fields clear.  CARDIAC:  Rhythm is regular.   We reviewed in detail both his myocardial perfusion imaging studies, and  his long-term situation. We talked about the non-steroidals in detail,  and also talked about the need for regular exercise and weight loss. I  plan to see him back in follow up within three months. Should he  have any problems in the interim, he is to call us. He plans to his  increase his activity significantly.     Arturo Morton. Riley Kill, MD, [facility]  Electronically Signed    TDS/MedQ  DD: 06/28/2007  DT: 06/29/2007  Job #: [id number]

## 2011-02-08 NOTE — Group Therapy Note (Signed)
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 Birdie Sons  for management of multiple  pain complaints. Mr. Wheller states he has a history of psoriasis as a  child and was diagnosed with psoriatic arthritis a few years ago by Dr.  Nila Nephew and a radiologist.   He has multiple pain complaints today including cervicalgia and lumbago.  His back and neck pain are his predominate pain problems at this time.  He also has complaints of bilateral lower extremity pain, which he  describes as fairly constant, dull achiness in the calves and anterior  tibial regions.   The pain his neck is worse with activity especially driving a car and  looking towards the left. He also has some difficulty with flexion and  extension of the neck.   His low back pain is between a 5 and a 10 on a scale of 10, located in  the low lumbar region over the sacroiliac area.   Overall average pain he states is about a 9 on a scale of 10. It  moderately interferes with his activity level and significantly  interferes his enjoyment of life.  Pain in the neck is described as  fairly constant as well, but worse with activity and described as dull  aching. He has sharp burning dull aching sensation in the low back and  dull and aching in his anterior tibial and gastrocnemius area  bilaterally.   Leg pain is fairly symmetric. One leg is not worse than the other.   Pain is typically worse with activity, however, leg pain is constant  despite any activity and is equally bad with sitting.   His pain improves when he takes ibuprofen. He has been taking 800 mg of  ibuprofen four times a day. His functional status is as follows. He is  able to walk about ten minutes at a time. He is able to climb stairs and  drive. He states that climbing stairs is getting more difficult for him.  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   He is independent with his health care. He is able to do some outside  work around the house as well. He is a high functioning individual  overall.   REVIEW OF SYSTEMS:  Mr. Keltner states he has had some problems with  incontinence two or three times over the last four months and he has  been followed by Dr. Vernice Jefferson and has another follow up appointment with  him on May 20. Mr. Olesen states that he may have a kidney stone, which  is lodged in the bladder causing some spasm. However, I do not have any  note from the urology office to confirm this.   Mr. Kope states he has some intermittent tingling, spasms, rare  dizziness. He does have a history of anxiety and takes Lexapro for the  last two to three years. He denies suicidal ideations.   He also admits to some shortness of breath and is on C-PAP for sleep  apnea. He sleeps in a recliner and has been sleeping in a recliner now  for about one year.   Physicians currently involved with Mr. Kestler care include Dr.  Cato Mulligan, Dr. Margaretha Sheffield, Dr. Riley Kill, Dr. Thurston Hole, Dr. Newell Coral and Dr.  Vernice Jefferson.   PAST MEDICAL HISTORY:  Is remarkable for history of psoriatic arthritis,  hypertension, coronary artery disease, status post two stents placed in  July of 2007, diabetes mellitus, currently on Lantus.   PAST SURGICAL HISTORY:  Is remarkable for 08/2007, left knee  arthroscopic surgery. He has had three ultrasound Doppler studies of the  left lower leg and has been told he does not have a clot in the leg.   SOCIAL HISTORY:  He is married, lives with his wife and his 75 year old  daughter. He denies smoking. Denies alcohol use.   FAMILY HISTORY:  Father is 27 and healthy. Mother is 74 years old and  has hypertension, thyroid disease, vasculitis and asthma and a calcified  aorta.  He had a 77 year old brother, who died of complications related  to diabetes and a  staph infection.  He has a sister, who is 31 and who  is healthy.   MEDICATIONS:  Mr. Khamis currently takes aspirin Actoplus, Metoprolol,  Plavix, Crestor, Zetia, Nexium, Glyburide, Benicar, Lexapro, fish oil,  red yeast, multi-vitamin, Lantus, Solo scan.   PHYSICAL EXAMINATION:  Mr. Tibbs blood pressure is 139/74, pulse 67,  respirations 18. 96% saturated on room air. He is a well-developed obese  gentleman with significant truncal obesity.   He is oriented times three. Speech is clear. His affect is bright. He is  alert, cooperative and pleasant. He follows commands without any  difficulty.   Transitioning from sitting to standing is done with ease. His gait in  the room is nonantalgic and symmetric with a slightly wide base.  He has  normal heel-toe mechanic and no antalgia is observed.   He has limitations in lumbar motion in all planes. Significant lack of  extension is noted.   He has full shoulder range of motion. He has decreased range of motion  in his cervical spine. Decreased flexion extension and rotation both  right and left, however, he lacks a bit more rotation to the left than  to the right.   Reflexes are 1+ at the biceps, triceps, brachial radialis. 1+ of the  patella. 0 at the Achilles tendons. His sensation is intact. Motor  strength is 5/5 without focal deficit with the exception of right EHL is  slightly weaker than the left EHL.  Straight leg raise is negative.   Tone is normal. There is no clonus.   His balance is quite good. Tandem gait and Romberg tests were both  performed adequately.   Palpation throughout the cervical paraspinal muscles reveals some mild-  to-moderate paraspinal muscle tenderness. He also has some moderate  tenderness over the sacroiliac joints bilaterally.   He has significant tenderness over both trochanters and down the  iliotibial band bilaterally as well.   He brings in a report from an MRI done 02/08/2007, lumbar MRI  without  contrast showed facet arthropathy at multiple lumbar levels without  evidence of central or foraminal stenosis.   IMPRESSION:  1. Cervicalgia.  2. Lumbago.  3. Bilateral lower extremity pain dull in nature, which is constant.   PLAN:  Mr. Horrigan reported a history of two falls within the last few  months and will go ahead and get flexion extension radiographs of his  neck.   We have discussed options to treat his cervical and lumbar pain. He  appears to be somewhat interested in pursuing a medial branch blocks  possibly moving towards neurotomy.   Will trial him initially on some Neurontin 300 mg one p.o. q.h.s. for  three days,  then titrating up to three times a day. Will see him back in  two weeks and may increase it further to improve some of the pain in the  lower extremities, which may be related to his diabetes.   Mr. Sacks is quite interested in increasing his general activity level  and getting involved in a therapeutic exercise program. Would like him  to start with water aerobics.   He also has some moderate trochanteric bursitis bilaterally, which may  overall improve with increased activity level especially with the water  arthritis program. If it does not improve, may consider injection into  the trochanter bilaterally.   Will see him back in two weeks and may titrate his Neurontin up a bit  more. May consider some electrodiagnostic studies of the lower  extremities as well and may consider adding Ultracet to help manage his  pain especially at night, because he is not sleeping well.  Will see him  back in two weeks.           ______________________________  Brantley Stage, M.D.     DMK/MedQ  D:  02/04/2008 14:19:53  T:  02/04/2008 16:12:26  Job #:  [id number]   cc:   Dr. Page Spiro H. Swords, MD  [address] [place]  [place] [address]

## 2011-02-08 NOTE — Cardiovascular Report (Signed)
NAMEHEZEKIAH, [name]               ACCOUNT NO.:  [id number]   MEDICAL RECORD NO.:  [id number]          PATIENT TYPE:  INP   LOCATION:  6526                         FACILITY:  [facility]   PHYSICIAN:  Everardo Beals. 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  He had an overlapping Taxus stent placed in  the right coronary artery in [place] when he was traveling there in  2005.  We studied him later in 2005 and he had a 70% lesion in the  circumflex artery and no obstruction in the stent.  He was admitted  yesterday with prolonged episode of chest pain suggestive of angina.  He  is diabetic.   PROCEDURE:  The procedure was followed by the right femoral artery and  arterial sheath and 5-French preformed coronary catheters.  A front wall  arterial puncture was performed and Omnipaque contrast was used.  After  completion of diagnostic study, we made decision to proceed with  intervention on the lesion in the mid circumflex artery.   The patient was given Angiomax bolus and infusion.  He was given  additional 300 mg load of Plavix.  He was given 20 mg of Pepcid.  He had  previously been given 4 chewable aspirin.   We chose a 6-French CLS #4 guiding catheter with side holes.  We crossed  the lesion with a Prowater wire with a moderate amount of difficulty.  We then predilated the lesion with 2.5 x 20 mm apex balloon performing 1  inflation up to 6 atmospheres for 30 seconds.  We then deployed a 2.5 x  28 mm Promus stent deploying this with 1 inflation up to 10 atmospheres  for 30 seconds.  The proximal reference vessel was only 2.5 mm which  probably indicated significant amount of plaque in that region and  throughout the vessel.  We then postdilated the stent with a 2.5 x 20 mm  Borden Voyager  performing 2 inflations up to 20 atmospheres for 30 seconds.  Final diagnostic studies were then performed through the guiding  catheter.  The patient tolerated the procedure well and left laboratory  in satisfactory condition.   RESULTS:  Aortic pressure was 129/73 with a mean of 98.  Left  ventricular pressure was 129/17.   The left main coronary. The left main coronary is free of significant  disease.   Left anterior descending artery.  The left anterior ascending artery  gave rise to 2 diagonal branches and 2 septal perforators.  There was  30% narrowing in the proximal LAD just after the septal perforator.   The circumflex artery.  The circumflex artery gave rise to a ramus  branch, a small marginal branch, and atrial branch and large and small  posterolateral branch.  There was 90% stenosis in the mid circumflex  artery with segmental disease extending over about 26 mm.   The right coronary artery.  The right coronary artery  was a large  dominant vessel.  It gave rise to a conus branch, a ventricle branch,  posterior descending branch, and 2 posterolateral branches.  There were  overlapping stents extending from the proximal to the distal vessel.  There was 30% narrowing right at the proximal edge of the stent.  There  was diffuse 30% narrowing within the distal 15 mm of the distal stent.   The left ventriculogram.  The left ventriculogram was performed on the  RAO projection showed good wall motion with no areas of hypokinesis.  The estimated fraction was 60%.   CONCLUSION:  1. Coronary artery disease with 30% narrowing in the proximal left      anterior descending, 90% narrowing in the mid circumflex artery      which had progressed from previous studies, and 30% narrowing in      the proximal edge of the overlapping stents in the right coronary      artery with 30% narrowing in the distal portion of the stent and      normal left ventricular function.  2. Successful  percutaneous coronary intervention of the lesion in the      mid circumflex artery using a Promus drug-eluting stent with      improvement in center narrowing from 90% to 0%.   DISPOSITION:  The patient  to return  __________ for further  observation.      Bruce Elvera Lennox Juanda Chance, MD, [facility]  Electronically Signed     BRB/MEDQ  D:  07/11/2008  T:  07/12/2008  Job:  [id number]   cc:   Valetta Mole. Swords, MD  Arturo Morton. Riley Kill, MD, [facility]  Cardiopulmonary Lab

## 2011-02-08 NOTE — Letter (Signed)
May 08, 2008    Bruce H. Swords, MD  [address] [place], [place] [address]   RE:  Adam, WATERSON  MRN:  [id number]  /  DOB:  11-15-1950   Dear Smitty Cords:   I had the pleasure of seeing [name] in the office today in  followup.  Clinically, he seems to be getting along reasonably well from  a cardiac standpoint.  He has been having minimal degree of shortness of  breath when he just gets up to do things, but fortunately is now doing  some water therapy.  He has not had significant chest pain, as you know,  he has had previous stenting with drug-eluting stents in the right  coronary artery and has residual circumflex disease.   MEDICATIONS:  1. Crestor 40 mg daily.  2. Plavix 75 mg daily.  3. Aspirin 81 mg daily.  4. Lexapro 10 mg daily.  5. Nexium 40 mg daily.  6. ActoPlus 1 b.i.d.  7. Metoprolol 25 mg b.i.d.  8. Fish oil daily.   PHYSICAL EXAMINATION:  Her blood pressure is 126/84 the pulse is 71.  The lung fields are clear.  Cardiac rhythm is regular.  Unfortunately,  his weight is 270 pounds.   His electrocardiogram demonstrates normal sinus rhythm with left axis  deviation   In summary, [name] appears to be doing well from a cardiac standpoint.  We will plan to see him back in the Cardiology Clinic in approximately 6  months' time.  Should he have any problems in the interim, he has to  contact us, I appreciate the opportunity of sharing in his care.    Sincerely,      [name]. Riley Kill, MD, [facility]  Electronically Signed    TDS/MedQ  DD: 05/08/2008  DT: 05/09/2008  Job #: [id number]

## 2011-02-08 NOTE — Op Note (Signed)
[name], [name]               ACCOUNT NO.:  [id number]   MEDICAL RECORD NO.:  [id number]          PATIENT TYPE:  AMB   LOCATION:  DAY                          FACILITY:  [facility]   PHYSICIAN:  Bertram Millard. Dahlstedt, M.D.DATE OF BIRTH:  [birth year]   DATE OF PROCEDURE:  06/23/2008  DATE OF DISCHARGE:  06/23/2008                               OPERATIVE REPORT   PREOPERATIVE DIAGNOSIS:  Left renal calculus.   POSTOPERATIVE DIAGNOSIS:  Left renal calculus.   PRINCIPAL PROCEDURE:  Cystoscopy, left double-J stent extraction, left  ureteroscopy with holmium laser lithotripsy of left renal stone, left  double-J stent placement, fluoroscopy.   SURGEON:  Bertram Millard. Dahlstedt, M.D.   ANESTHESIA:  General endotracheal.   COMPLICATIONS:  None.   BRIEF HISTORY:  A 60 year old male who recently presented with a left  UPJ stone.  This occurred after hours and Dr. Lorin Picket MacDiarmid placed  double-J stent as immediate treatment.  This stone was visualized on the  KUB in the office.  However, on the lithotripsy unit today, the stone  could not be identified for adequate positioning and targeting.  Because  of this, it was recommended that we then proceed with ureteroscopy and  holmium laser of the stone.  We tried to do this today, as the patient  has a significant family engagement in about 5 days.  Risks and  complications of the procedure have been discussed with the patient.  He  understands these and desires to proceed.   DESCRIPTION OF PROCEDURE:  The patient was identified in the holding  area and the surgical side marked.  He received preoperative IV  antibiotics.  He was taken to the operating room where general  anesthetic was administered.  He was placed in the dorsal lithotomy  position.  Genitalia and perineum were prepped and draped.  Time-out was  then called.   The rigid ureteroscope was passed under direct vision through his  urethra.  The left ureteral stent was identified  and extracted.  The  guidewire was placed in the left ureter with good coil seen in the renal  pelvis.  A 55-cm ureteral access sheath was then placed.  The flexible  ureteroscope was guided into the left kidney.  The stone, which had been  measured at approximately 8-10 mm, was visualized.  The holmium laser  fiber was passed, 200 micron.  The stone was fragmented into several  smaller fragments.  Due to the size of the scope as well as the need for  passing a fiber or basket, it was difficult to identify how many  fragments were produced.  However, in fragmentation, there was a  significant amount of sediment produced.  I attempted to basket a few  fragments.  These would not easily pass through the access catheter.  After significant fragmentation, I decided to leave the fragments within  the left kidney, so that they might be able to pass.  A double-J stent  was then placed over a guidewire after the ureteroscope had  been removed.  A 4.8-French x 26-cm stent was used, with the string left  on.  This was brought through the urethra after the bladder was drained  and the scope removed.   The patient tolerated the procedure well.  He was awakened and taken to  the PACU in stable condition.      Bertram Millard. Dahlstedt, M.D.  Electronically Signed     SMD/MEDQ  D:  06/23/2008  T:  06/24/2008  Job:  [id number]

## 2011-02-08 NOTE — Assessment & Plan Note (Signed)
Mr. 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   Mr. Mustard is back in and states that his leg pain is improved since he  restarted his Neurontin at a higher dose.  He is currently taking 400 mg  4 times a day and reports overall improvement in his leg pain.  He  states that he was able to play a 11 holes of golf on March 29, 2008.  He  has not played even 9 holes of golf for at least 2 years.  He has been  increasing his activity overall and feels that the Neurontin is helping  him.   His average pain is about a 5 on a scale of 10.  Sleep is fair.  He is  actually sleeping better now that he is on Neurontin.  Activity is  moderately interfered with by his pain.  His pain is typically worse  with walking and standing; improves with rest, heat, medications.  He is  getting fair relief with current meds at this time.   He still has some intermittent cervicalgia as well as lumbago and has  some continued left hip pain, especially at heel strike.  He notes some  pain in the left lateral hip.   He was continued in physical therapy and they have done some manual  therapy with him as well as educating him on stretching and they have  done some ultrasound along the left hip as well.   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  He is independent with all of his self-care.  He admits to occasional  tingling in the lower extremities and trouble walking.  Denies  depression, anxiety, or suicidal ideation.  Denies bowel or bladder  problems.   No other changes with respect to review of systems.   Past medical, social, and family history are unchanged.   Medications prescribed by this clinic include Neurontin 400 mg 1 p.o.  q.i.d.   On exam, blood pressure 153/65, pulse 83,  respirations 20, and 96%  saturate on room air.  He is an obese, well-developed gentleman.  He is  orientated x3.  Speech is clear.  Affect is bright.  He is alert,  cooperative, and pleasant.  Follows commands without difficulty.   Cranial nerves are grossly intact.  Coordination is grossly intact.  Reflexes are diminished in the lower extremities.  Motor strength is 5/5  without focal deficits.  Sensation is intact below the knees with light  touch.   He transitioned from sitting to standing easily.  Gait in the room is  slightly antalgic with slight decreased weightbearing in the right lower  extremity.   Internal and external rotation of the hips reveals good range of motion.  He does complain of some pain in the right lateral hip and slightly into  the groin with internal rotation of the left hip.  He has exquisite  tenderness along the left trochanter and on the iliotibial band.   IMPRESSION:  1. Cervicalgia.  2. Lumbago.  3. Bilateral lower extremity pain into the calves, has resolved.  4. Left lateral hip pain consistent with trochanteric bursitis and      iliotibial band syndrome.   PLAN:  We will have the patient continue on physical therapy to address  his left  hip bursitis and iliotibial band syndrome.  We will continue  him on Neurontin 400 mg q.i.d.   I have encouraged him to continue to engage in his clinical program as  well as home program to address his left hip trochanteric bursitis and  iliotibial band syndrome.   Overall, his activity level is improving.  He played 11 holes of golf on  March 29, 2008.  He has not played this many holes for about 2 years.  Overall, he is pleased with his overall improvement.  I have encouraged  him to continue to stay active and he seems to be doing quite well on  the Neurontin without problems of oversedation, gait problems, or edema  at this time.  We will see him back in 5 weeks.            ______________________________  Brantley Stage, M.D.     DMK/MedQ  D:  04/09/2008 09:44:45  T:  04/10/2008 02:05:55  Job #:  [id number]   cc:   Valetta Mole. Swords, MD  [address] [place]  [place] [address]

## 2011-02-08 NOTE — Discharge Summary (Signed)
[name], [name]               ACCOUNT NO.:  [id number]   MEDICAL RECORD NO.:  [id number]          PATIENT TYPE:  INP   LOCATION:  4732                         FACILITY:  [facility]   PHYSICIAN:  Arturo Morton. Riley Kill, MD, FACCDATE OF BIRTH:  November 18, 1950   DATE OF ADMISSION:  01/12/2009  DATE OF DISCHARGE:  01/15/2009                               DISCHARGE SUMMARY   PRIMARY CARDIOLOGIST:  Arturo Morton. Riley Kill, MD, [facility]   HEMATOLOGY/ONCOLOGY:  Genene Churn. Cyndie Chime, MD   PRIMARY CARE PHYSICIAN:  Valetta Mole. Swords, MD   DISCHARGING DIAGNOSES:  1. Bilateral pulmonary emboli.  2. Participant in the Rivaroxaban as prior to the [employer]      study.  The patient being maintained on Plavix without aspirin.  3. Hypercoagulopathy, unclear etiology, pending outpatient workup with      Dr. Cyndie Chime.   PAST MEDICAL HISTORY:  1. Coronary artery disease.      a.     Status post PROMUS drug-eluting stent to the mid circumflex       in October 2009.      b.     Overlapping TAXUS drug-eluting stents to the right coronary       artery in 2005.  2. Poorly controlled diabetes with a hemoglobin A1c of 8.7.  3. Kidney stones.  4. Hypertension.  5. Hyperlipidemia.  6. Morbid obesity.  7. Obstructive sleep apnea/CPAP use.  8. Renal artery stenosis, status post left renal stent and a urethral      stent in 2009.  9. Degenerative joint disease.   PROCEDURES THIS ADMISSION:  1. A 2-D echocardiogram obtained on January 13, 2009.  Impression; left      ventricle normal in size systolic function, ejection fraction 55%,      right ventricle poorly visualized.  There is suggestion that is      mildly enlarged with normal systolic function.  Mild pulmonary      hypertension.  2. CT angiogram of chest showed bilateral pulmonary emboli.  3. Abdominal ultrasound.  Slight diffuse fatty infiltration of liver.      Intervals 6 mm mid-to-lower pole, nonobstructing left renal      calculus with resolved  previous slight left hydronephrosis.      Otherwise, negative chest x-ray showing no acute findings.  4. Lower extremity venous Doppler, no evidence of Baker cyst on the      right or left, no evidence of deep venous thrombosis or superficial      thrombosis.   HOSPITAL COURSE:  [name] is a 60 year old Caucasian gentleman with  past medical history as stated above who presented to [facility]  Emergency Room complaining of cough, right-sided chest pain worse with  taking deep breaths, increased dyspnea on exertion with much worsening  over the last 4 days.  The patient was seen in the ER, D-dimer elevated  at 1.64.  Cardiac enzymes negative.  Initial platelets are 192,000.  The  patient was seen by Joni Reining and Dietrich Pates.  CT angiogram  obtained showed multiple bilateral PE.  Anticoagulation therapy was  initiated.  The  patient agreed to participate in [place] research  study.  He was randomized to Rivaroxaban Xa inhibitor for treatment of  his pulmonary emboli.  Heparin was discontinued.  The patient received  the starting dose of his research medication.  A 2-D echocardiogram  obtained, results as stated above.  Lower extremity Doppler obtained,  results as stated above.  Dr. Riley Kill spoke with Dr. Cyndie Chime  regarding the patient's status.  Dr. Cyndie Chime agreed to evaluate the  patient outpatient for on hypercoagulopathy.  Recommendation to continue  Plavix along with his anticoagulant, hold aspirin at this point.  Dr.  Riley Kill in to see the patient on day of discharge, the patient is  stable.  Blood pressure 107/61, heart rate 76.  The patient being  discharged home to follow up with Dr. Cato Mulligan in 1 week and follow up  with Dr. Riley Kill.  I have scheduled him to be seeing on January 20, 2009  at 2:45.  I spoke with Bradly Chris and Dr. Patsy Lager office, they will  arrange for the patient to be seeing.  I have given Bradly Chris Mr. Chandra's  cell phone number and home number.   They agreed to call him at home with  date and time to see Dr. Cyndie Chime.   MEDICATIONS AT TIME OF DISCHARGE:  I have spoke with the research team.  They will arrange study drug prior to discharge today.  Otherwise  medications include Plavix 75, metoprolol 12.5 b.i.d., Crestor 20,  Nexium 40, fish oil 1 g daily, red yeast rice as previously taken,  Benicar 40 mg daily, Lexapro 10, Levemir, NovoLog, metformin, Actos as  previously prescribed by Dr. Cato Mulligan.  The patient is to follow up with  Dr. Cato Mulligan regarding his elevated hemoglobin A1c this admission for  further adjustments in medications.  Chlorthalidone 25 mg daily,  cetirizine 10 mg daily.  No aspirin.   DURATION OF DISCHARGE ENCOUNTER:  Over 30 minutes.      Dorian Pod, ACNP      Arturo Morton. Riley Kill, MD, [facility]  Electronically Signed    MB/MEDQ  D:  01/15/2009  T:  01/15/2009  Job:  [id number]   cc:   Genene Churn. Cyndie Chime, M.D.  Bruce Rexene Edison Swords, MD

## 2011-02-08 NOTE — Assessment & Plan Note (Signed)
[facility]                            CARDIOLOGY OFFICE NOTE   [name], [name]                      MRN:          [id number]  DATE:07/23/2008                            DOB:          [birth year]    PRIMARY CARDIOLOGIST:  Arturo Morton. Riley Kill, MD, [facility]   [name] is a very pleasant 60 year old white male patient who was  hospitalized for chest pain on July 10, 2008.  He was found to have a  90% mid circumflex in which Dr. Juanda Chance placed a PROMUS drug-eluting  stent successfully.  He had residual 30% LAD, 30% narrowing in the  proximal edge of overlapping stents in the RCA, and 30% narrowing in the  distal portion of the stent of the RCA with normal LV function.   Since the patient has been home, he denies any chest pain, palpitations,  dyspnea, dyspnea on exertion, dizziness, or presyncope.  He started  watching his diet closely and is walking 5 minutes at a time each day.  He has been contacted by cardiac rehab which he is hoping to start.   CURRENT MEDICATIONS:  1. Fish oil 1200 mg daily.  2. Actoplus b.i.d.  3. Nexium 40 mg daily.  4. Aspirin 81 mg daily.  5. Plavix 75 mg daily.  6. Multivitamin daily.  7. Crestor 20 mg daily.  8. Benicar 40 mg daily.  9. Zetia 10 mg daily.  10.Metoprolol 12.5 mg b.i.d.  11.Levemir 40 units b.i.d.  12.Red yeast rice 600 mg daily.   PHYSICAL EXAMINATION:  GENERAL:  This is a pleasant overweight 57-year-  old white male in no acute distress.  VITAL SIGNS:  Blood pressure 121/81, pulse 83, weight 266.  NECK:  Without JVD, HJR bruit, or thyroid enlargement.  LUNGS:  Decreased breath sounds, but clear anterior, posterior, and  lateral.  HEART:  Regular rate and rhythm at 83 beats per minute.  Normal S1 and  S2.  No murmur, rub, bruit, thrill, or heave noted.  ABDOMEN:  Obese.  Normoactive bowel sounds are heard throughout.  No organomegaly, masses,  lesions, or abnormal tenderness.  Right groin  without hematoma or  hemorrhage.  LOWER EXTREMITIES:  Without cyanosis, clubbing, or edema.  He has good  distal pulses.   EKG, normal sinus rhythm.  No acute change.   IMPRESSION:  1. Coronary artery disease status post PROMUS drug-eluting stent to      the mid circumflex on July 10, 2008, by Dr. Juanda Chance.  2. History of overlapping Taxus stent to the right coronary artery in      [place] in 2005.  3. Normal left ventricular function.  4. Kidney stones.  5. Diabetes mellitus.  6. Hypertension.  7. Hyperlipidemia.  8. Obesity.  9. Status post left kidney stent and urethral stent in September 2009.   PLAN:  The patient is stable from a cardiac standpoint.  I recommend  that he increase his physical activity and enroll in cardiac rehab on a  good exercise program.  He is trying to lose weight, which I encouraged  and he will see Dr.  Stuckey back in 2-3 months.      Jacolyn Reedy, PA-C  Electronically Signed      Marca Ancona, MD  Electronically Signed   ML/MedQ  DD: 07/23/2008  DT: 07/24/2008  Job #: [id number]

## 2011-02-08 NOTE — Letter (Signed)
October 07, 2008    Bruce H. Swords, MD  [address] [place], [place] [address]   RE:  [name], [name]  MRN:  [id number]  /  DOB:  December 12, 1950   Dear Smitty Cords,   I had the pleasure of seeing Nickolis Diel in the office today in  followup.  As you know, he has had a pretty rough fall.  He had kidney  stones and subsequently had to have stenting.  He then developed a  stomach virus and developed some dehydration.  He has recently developed  some abdominal and lower extremity swelling.  You have ordered an  echocardiogram and this is scheduled tomorrow  morning at 7:30.  He has  had a little bit of shortness of breath.  His weight, however, has come  down from 279 to 269.  He is scheduled to see you later this week.   PHYSICAL EXAMINATION:  VITAL SIGNS:  Today, the blood pressure was  158/80 and the pulse is 65.  NECK:  The jugular veins were not distended, whatsoever.  LUNG:  Lung fields were clear.  ABDOMEN:  I could not feel a fluid wave.  He does have some right upper  quadrant tenderness.  EXTREMITIES:  Trace if any edema.   His electrocardiogram demonstrates normal sinus rhythm with left axis  deviation.   Since coming off of Actos, he has clinically improved.  I planned to  have him return to see you in followup and he is going to see you on  Thursday and a 2-D echo will be done.  I have no reason to believe from  a clinical exam standpoint that he has significant left ventricular or  even right ventricular failure.  The cause of the excess fluid could in  fact be Actos, which you have already  intervened upon.  With the right upper quadrant tenderness, he may  require further abdominal investigation, although this has been done in  the past.  We will see him back in followup in 2 months in Cardiology,  but at the present time, he seems to be stable from this standpoint.  I  will get a chance to review his echo tomorrow.    Sincerely,      Arturo Morton. Riley Kill, MD,  [facility]  Electronically Signed    TDS/MedQ  DD: 10/07/2008  DT: 10/08/2008  Job #: [id number]

## 2011-02-08 NOTE — Discharge Summary (Signed)
[name], [name]               ACCOUNT NO.:  [id number]   MEDICAL RECORD NO.:  [id number]          PATIENT TYPE:  INP   LOCATION:  4737                         FACILITY:  [facility]   PHYSICIAN:  Adam Canning. Ladona Ridgel, MD    DATE OF BIRTH:  March 02, 1951   DATE OF ADMISSION:  06/23/2007  DATE OF DISCHARGE:  06/24/2007                               DISCHARGE SUMMARY   PROCEDURES:  None.   PRIMARY FINAL DISCHARGE DIAGNOSES:  Chest pain, possibly  gastrointestinal in origin; enzymes negative for myocardial infarction.  An outpatient stress test is pending.   SECONDARY DIAGNOSES:  1. Status post non-ST segment elevation myocardial infarction in 2005      with TAXUS stent x2 to the right coronary artery in [place].  2. History of chest pain with a re-look catheterization in 2005      showing left main no stenosis, left anterior descending  30%,      circumflex 70%, right coronary artery stent patent, ejection      fraction  60%. Medical therapy recommended.  3. History of chest pain in 2007 with an admission and a Myoview at      that time which was negative with an ejection fraction of 58%.  4. Acute renal failure secondary to dehydration which was secondary to      gastroenteritis in 2007, resolved.  5. Diabetes.  6. Hypertension.  7. Hyperlipidemia.  8. Obesity.  9. Gastroesophageal reflux disease, history of gastritis, hiatal      hernia.  10.History of nephrolithiasis.  11.History of anxiety.  12.Psoriatic arthritis.  13.Status post left knee surgery as well as esophagogastroduodenoscopy      and colonoscopy with polypectomy.  14.Allergy or intolerance to [name].   TIME AT DISCHARGE:  41 minutes.   HOSPITAL COURSE:  [name] is a 60 year old male with a history of  coronary artery disease.  He states he has been under greatly increased  stress for the last couple of months and for the last 4-6 weeks has been  having intermittent choking sensation and chest pressure 2-3 times  a  day.  He woke up with it on the day of admission, and it was a 5 or 6/10  which was worse than usual.  In the emergency room, his symptoms were  completely relieved a by GI cocktail.  He was admitted for further  evaluation.   His cardiac enzymes were negative.  His TSH was within normal limits at  1.861.  A hemoglobin A1c was elevated at 7.4.  He admits that he has not  been sticking tightly to a diabetic diet.  A chest x-ray showed linear  markings, possibly scarring that were unchanged and no acute disease.   [name] as had no further episodes of chest pain.  He is pending  evaluation by physician but tentatively considered stable for discharge  with outpatient followup arranged.   DISCHARGE INSTRUCTIONS:  1. His activity level is to be increased gradually.  2. He is to stick to a low-sodium diabetic diet.  3. He is to follow up with Dr. Riley [name] as  scheduled on October 2, and      he is to call Dr. Cato [name] for an appointment for management of his      diabetes and stress issues.  4. He is to get a stress test. and our office will call him with an      appointment.   DISCHARGE MEDICATIONS:  1. Carafate 1 gram before meals or food and nightly for 1 week.  2. Nexium 40 mg b.i.d. for 2 weeks, then decrease back to daily.  3. Celebrex 200 mg daily.  4. Glucophage 1 gram b.i.d.  5. Metoprolol 25 mg b.i.d.  6. Plavix 75 mg daily.  7. Lisinopril 20 mg a day.  8. Aspirin 81 mg a day.  9. Lexapro 10 mg a day.  10.Simvastatin 40 mg a day.  11.Glyburide 5 mg a day.  12.Lantus 20 units daily.  13.Zyrtec 10 mg daily.  14.He is to avoid ibuprofen if possible.      Adam Demark, PA-C      Adam Canning. Ladona Ridgel, MD  Electronically Signed    RB/MEDQ  D:  06/24/2007  T:  06/24/2007  Job:  [id number]   cc:   Adam Mole. Swords, MD

## 2011-02-08 NOTE — Op Note (Signed)
[name], [name]               ACCOUNT NO.:  [id number]   MEDICAL RECORD NO.:  [id number]          PATIENT TYPE:  AMB   LOCATION:  DAY                          FACILITY:  [facility]   PHYSICIAN:  Bertram Millard. Dahlstedt, M.D.DATE OF BIRTH:  1951-02-16   DATE OF PROCEDURE:  03/06/2009  DATE OF DISCHARGE:                               OPERATIVE REPORT   PREOPERATIVE DIAGNOSIS:  Left upper ureteral stone.   POSTOPERATIVE DIAGNOSIS:  Left renal stone.   SURGICAL PROCEDURES:  Cystoscopy, left retrograde ureteral pyelogram,  left ureteroscopy with holmium laser of left renal stone, double-J stent  on left.   SURGEON:  Bertram Millard. Dahlstedt, M.D.   ANESTHESIA:  General endotracheal.   COMPLICATIONS:  None.   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  He presented a couple of weeks ago with significant left  flank pain while on vacation.  Evaluation revealed an 11 mm left UPJ  stone.  The patient has been minimally symptomatic since that time.  Due  to the radiolucency, as well as the fact that this is a high stone, it  was recommended that he undergo ureteroscopy with holmium laser of the  stone.  The risks and complications have been discussed with the  patient.  He understands these and desires to proceed.   DESCRIPTION OF PROCEDURE:  The patient was identified in the holding  area, and the surgical side marked.  He was administered preoperative IV  antibiotics and taken to the operating room, where a general anesthetic  was administered using endotracheal apparatus.  He was placed in the  dorsal lithotomy position.  The genitalia and perineum were prepped and  draped.  A time-out was then called.   The procedure then commenced.  A 22-French panendoscope was advanced to  the urethra which was normal.  The prostate was not obstructive.  The  bladder was normal.  The left ureter was cannulated with a 6-French open-  ended catheter.   Retrograde  pyelogram was performed.  This showed a normal distal ureter.  There was a filling defect at the proximal ureter, which with even a  light pressure, bounced up into the renal pelvis.  There was moderate  blunting of the caliceal system.   At this point a guidewire was introduced into the left ureter and renal  pelvis, and the cystoscope removed.  I then passed a rigid ureteroscope.  This revealed a normal ureter, but I could not get up to the stone which  was in the pelvis.  I eventually place a 35 cm ureteral access sheath.  Ureteroscope was then passed.  This revealed a significant sediment in  the patient's kidney.  The stone was eventually identified, settling  down in a posterior calix.  It was fairly large.  A 200 micron laser  fiber was then advanced through the scope.  I then lasered the stone  into multiple smaller fragments.  The sediment did impede visualization  some.  I would imagine that there were approximately 40 small fragments  through a half an hour of fragmenting with  the laser.  No large  fragments were seen.  I felt it prudent to, because of the large number  fragments, just leave the fragments and not extract done, and placed a  double-J stent to assist in passage of the fragmentation, as if he had a  lithotripsy.  I then withdrew the ureteroscope and passed, using  cystoscopic assistance, a 6-French x 24 cm double-J stent.  Good curls  were seen proximally and distally once the guidewire was removed.  The  bladder was drained and the cystoscope removed.  A B and O suppository  was placed.   The patient tolerated the procedure well.  He was awakened and taken to  the PACU in stable condition.      Bertram Millard. Dahlstedt, M.D.  Electronically Signed     SMD/MEDQ  D:  03/06/2009  T:  03/06/2009  Job:  [id number]   cc:   Genene Churn. Cyndie Chime, M.D.  Fax: [phone]   Charlcie Cradle Delford Field, MD, FCCP  [address]  [place]  [place] [address]

## 2011-02-08 NOTE — Op Note (Signed)
NAMEVED, MARTOS               ACCOUNT NO.:  [id number]   MEDICAL RECORD NO.:  [id number]          PATIENT TYPE:  OBV   LOCATION:  0098                         FACILITY:  [facility]   PHYSICIAN:  Martina Sinner, MD DATE OF BIRTH:  Oct 30, 1950   DATE OF PROCEDURE:  06/11/2008  DATE OF DISCHARGE:  06/11/2008                               OPERATIVE REPORT   PREOPERATIVE DIAGNOSIS:  Left renal stone.   POSTOPERATIVE DIAGNOSIS:  Left renal stone.   SURGERY:  Cystoscopy, left retrograde pyelogram, insertion of left  ureteral stent.   HISTORY:  Mr. Calem Cocozza has a large stone at the left ureteropelvic  junction.  It was over 1 cm in size.  It was difficult to see on KUB.  He had continual nausea and pain in the emergency room and so he elected  to have a stent.   PROCEDURE IN DETAIL:  The patient was prepped and draped in the usual  fashion.  I initially scoped the patient with a 70 [place] scope.  The  bulbar, membranous and prostatic urethra were normal.  He had a 1/4  bladder trabeculation.  Trigone was easily identified.   I passed a sensor wire under direct cystoscopic vision and fluoroscopic  vision to the mid left ureter.  I passed a 6 [place] open-ended ureteral  catheter over it.  I did a gentle retrograde of 3-4 mL of contrast just  to outline the filling defect near the ureteropelvic junction and  nondilated calyces and a small renal pelvis.  This was the target I used  to pass the stent.   I then fluoroscopically and cystoscopically passed the sensor wire up  and curled in the upper pole calix.  I then passed a 26 cm x 6 French  double-J stent over the sensor wire fluoroscopically and cystoscopically  up into the renal pelvis.   For reasons I could not explain it was difficult to pass the stent with  any sort of speed almost the entire length of the ureter.  The stent  wanted to bow a little bit into the medial aspect of the bladder.  I  could inch it along  approximately a centimeter at a time.  In my opinion  there was not increased resistance at the level of the stone.  It  finally reached the left renal pelvis.  It was not traumatic.  I removed  the sensor wire and there was a nice curl in the left renal pelvis and  in the bladder.  The bladder was emptied.  He was given preoperative  ciprofloxacin.   I will have Mr. Gortney follow up with Dr. Marcine Matar next week.  I will send him home on Vicodin, Phenergan and ciprofloxacin.  He is a  diabetic and I will talk to his family about warning signs of fever.           ______________________________  Martina Sinner, MD  Electronically Signed     SAM/MEDQ  D:  06/11/2008  T:  06/13/2008  Job:  [id number]   cc:   Bertram Millard. Dahlstedt, M.D.  Fax: [phone]

## 2011-02-08 NOTE — Discharge Summary (Signed)
NAMEGAUDENCIO, CHESNUT               ACCOUNT NO.:  [id number]   MEDICAL RECORD NO.:  [id number]          PATIENT TYPE:  INP   LOCATION:  6526                         FACILITY:  [facility]   PHYSICIAN:  Noralyn Pick. Eden Emms, MD, FACCDATE OF BIRTH:  [birth year]   DATE OF ADMISSION:  07/10/2008  DATE OF DISCHARGE:  07/11/2008                               DISCHARGE SUMMARY   PROCEDURES PERFORMED DURING HOSPITALIZATION:  1. Cardiac catheterization completed by Dr. Charlies Constable on July 11, 2008.      a.     Circumflex showing 90% mid right coronary artery, 30%       proximal edge stent, 30% diffuse distal, LV normal, stent to       circumflex using a Promus drug-eluting stent 2.5 x 28 mm, reducing       stenosis, and circumflex from 90% to 0%.   FINAL DISCHARGE DIAGNOSES:  1. Coronary artery disease.      a.     History of non-ST elevated myocardial infarction.      b.     Percutaneous coronary intervention x2 to unknown arteries in       [place], [place].      c.     Status post percutaneous coronary intervention to the       circumflex and a 90% mid vessel occlusion, reducing it from 90% to       0% using a Promus drug-eluting stent.  2. Diabetes.  3. Hypertension.  4. Dyslipidemia.  5. History of obesity.  6. Left kidney stent, status post urethral stent on June 11, 2008.   HOSPITAL COURSE:  This is a 60 year old Caucasian male with known  history of CAD, status post PCI x2 to the right coronary artery.  The  patient complained of feeling cold after dinner with anorexia, went to  the car and had sudden onset of substernal chest discomfort with pain  radiating to the left arm and neck with associated shortness of breath  lasting approximately 30 minutes which was very intense.  The patient  had some mild presyncope.  The patient seen in the emergency room by Dr.  Verne Carrow and Dr. Arabella Merles, fellow for Dr. Clifton James and  admitted to rule out myocardial  infarction.  The patient was started on  heparin and monitored for elevation in cardiac enzymes.   The following morning, the patient was seen by Dr. Verne Carrow.  His cardiac enzymes were found to be negative.  He had some continuation  of chest discomfort the night before.  The patient was continued on  aspirin and Plavix and heparin was discontinued, but as result of the  patient's symptoms and known history of coronary artery disease, the  patient was scheduled for cardiac catheterization on the October 16.   Cardiac catheterization was completed by Dr. Charlies Constable and was found  that the patient had a 90% mid circumflex stenosis, was subsequently  amenable to PCI which was completed using a Promus drug-eluting stent  2.5 x 28 mm.  The patient recovered  well without any evidence of  arrhythmias, recurrence of chest discomfort, bleeding, or bruit noted at  the right femoral cath insertion site.   The patient was seen and examined by Dr. Charlton Haws on the morning of  discharge and found to be stable.  The patient will continue with Plavix  and aspirin, and follow up with Dr. Riley Kill within the next 2-4 weeks.   DISCHARGE LABS:  Sodium 138, potassium 3.9, chloride 107, CO2 26,  glucose 167, BUN 13, creatinine 0.80, hemoglobin 11.3, hematocrit 33.7,  white blood cells 4.3, platelets 183, troponin less than 0.01, CK 46, MB  1.1, hemoglobin A1c 8.0.  Chest x-ray on July 10, 2008, revealed no  acute abnormalities.   Vital signs at discharge:  Blood pressure 142/63, pulse 82, respirations  20, temperature 98.7, and O2 sat 93% on room air.  The patient's weight  121.7 kg.   DISCHARGE MEDICATIONS:  1. Metoprolol 12.5 mg twice a day.  2. Actoplus 15 mg twice a day.  3. Nexium 40 mg daily.  4. Zetia 10 mg daily.  5. Crestor 20 mg daily.  6. Plavix 75 mg daily.  7. Aspirin 81 mg daily.  8. Lexapro 10 mg at bedtime.  9. Levemir 4 units subcu daily.  10.Fish oil 1200 mg  daily.  11.Red rice yeast daily.   ALLERGIES:  FELDENE.   FOLLOWUP PLANS AND APPOINTMENTS:  1. The patient will follow up with Dr. Shawnie Pons in the office      within 2-4 weeks.  Our office will call and make that appointment.      2.  The patient is given post cardiac catheterization instructions      particularly this is on the right groin site, evidence of bleeding,      hematoma, or signs of infection.  2. The patient had been advised to follow up with primary care      physician for continued management of diabetes.      Bettey Mare. Lyman Bishop, NP      Noralyn Pick. Eden Emms, MD, [facility]  Electronically Signed    KML/MEDQ  D:  07/12/2008  T:  07/12/2008  Job:  [id number]

## 2011-02-08 NOTE — H&P (Signed)
[name], [name]               ACCOUNT NO.:  [id number]   MEDICAL RECORD NO.:  [id number]          PATIENT TYPE:  INP   LOCATION:  4732                         FACILITY:  [facility]   PHYSICIAN:  Pricilla Riffle, MD, FACCDATE OF BIRTH:  Jan 01, 1951   DATE OF ADMISSION:  01/12/2009  DATE OF DISCHARGE:                              HISTORY & PHYSICAL   PRIMARY CARDIOLOGIST:  Arturo Morton. Riley Kill, MD, [facility]   PRIMARY CARE PHYSICIAN:  Valetta Mole. Swords, MD   HISTORY OF PRESENT ILLNESS:  This is a 60 year old obese Caucasian male  with known history of CAD, status post drug-eluting stent to the mid  circ in August 2009 as two TAXUS overlapping stents to the right  coronary artery in 2005 with history of diabetes, difficult to control,  hypertension, hyperlipidemia along with renal artery stenosis who was  seen in the emergency room today with complaints of right-sided chest  pain, which occurred taking deep breaths.  He also had a cough which  started about 3-4 days ago.  He also had a recent sinus infection in  November 24, 2008 and was treated with antibiotics, but he has been having a  sore throat since snowfall ever since at time with increasing dyspnea on  exertion.  He states that the dyspnea on exertion, however, really  started at the first of the year and has been beginning progressively  worse over the last 4 months.  He has an office in the basement and  climbing stairs up to the first floor is really causing to have some  shortness of breath.  His breathing got really bad over the last 4  days and he did talk with Dr. Cato Mulligan about it who told him he felt that  it was related to his weight that he should walk around down stairs in  the basement before climbing the stairs, however, that did not improve  his breathing status.  This a.m. while taking at recycled goods to the  road (the patient had to drive to the road to drop off the recyclable  items).  The patient had some right-sided chest  pain, which he described  sharp, occurring only with deep breaths.  The patient has also been  having some complaints of gas and diarrhea over the weekend.  Of note,  the patient's angina equivalent is right arm pain, jaw pain, and chest  fullness and he states he has not been feeling that.  His main complaint  has been shortness of breath and this morning, having the sharp right-  sided chest pain.  He has also noted that his blood glucose has been  elevated x1 week and more difficult to control.  As a result, the  patient came to the emergency room.  He is not complaining of any frank  chest discomfort or shortness of breath at this time.  He will complain  of some right-sided chest discomfort described sharp with deep breath on  exam.   REVIEW OF SYSTEMS:  Sweatiness; sore throat; chest pain; dyspnea on  exertion; orthopnea, three-pillow; cough; nausea.  All systems are  reviewed and otherwise negative.   PAST MEDICAL HISTORY:  1. CAD.      a.     Status post PROMUS drug-eluting stent to the mid circ in       October 2009.      b.     Overlapping TAXUS drug-eluting stents to the right coronary       artery in 2005.  2. Kidney stones.  3. Diabetes.  4. Hypertension.  5. Hyperlipidemia.  6. Morbid obesity.  7. Renal artery stenosis, status post left kidney stent and an      urethral stent in 2009.  8. Sleep apnea using CPAP in the bed, but mostly sleeping in the      recliner chronically.  9. Degenerative joint disease.   Echocardiogram in February revealing LVEF of 60%.   PAST SURGICAL HISTORY:  None.   SOCIAL HISTORY:  He lives in [name] with his wife.  He is a Conservator, museum/gallery.  He is married, does not smoke, does not drink  alcohol.  No drug use.   FAMILY HISTORY:  Mother is alive with asthma, CAD.  She had bypass  grafting and valvular replacement.  Father in good health.  He has one  brother who deceased from diabetes and complications of CAD.  He also   had a PE after a bronch.  The patient's brother died at age 28.  He also  has a sister who is in good health.   CURRENT MEDICATIONS AT HOME:  1. Metformin 500 mg twice a day.  2. Metoprolol 12.5 mg b.i.d.  3. Actos plus 15 mg b.i.d.  4. Nexium 40 mg daily.  5. Crestor 20 mg daily.  6. Plavix 75 mg daily.  7. Aspirin 81 mg daily.  8. Lexapro 10 mg daily.  9. Levemir 40 units subcu b.i.d.  10.Fish oil 1000 mg daily.  11.Red yeast rice daily.  12.Nabumetone.  13.Multivitamin daily.  14.NovoLog 12 units q.i.d.  15.Chlorthalidone 25 mg daily.  16.Benicar 40 mg daily.  17.Cetirizine 10 mg daily.   ALLERGIES:  FELDENE.   CURRENT LABS:  D-dimer 1.64.  BNP less than 30, troponin less than 0.05.  Sodium 139, potassium 4.1, chloride 106, CO2 24, BUN 25, creatinine 0.9,  glucose 233.  Hemoglobin 13.5, hematocrit 39.2, white blood cells 5.3,  platelets are 192.   EKG revealing sinus rhythm with a left anterior fascicular block and  ventricular rate of 83 beats per minute.  Chest x-ray revealing no acute  abnormality.   PHYSICAL EXAMINATION:  VITAL SIGNS:  Blood pressure 127/72, pulse 86,  respirations 20, temperature 98.1, O2 saturation 94% on room air, weight  258 pounds.  GENERAL:  He is an awake, alert, and oriented in no acute distress.  HEENT: Head is normocephalic and atraumatic.  Eyes PERRLA.  Mucous  membranes, mouth pink and moist.  Tongue is midline.  NECK:  Supple.  He has no JVD.  Neck is obese.  There are no carotid  bruits appreciated.  CARDIOVASCULAR:  Regular rate and rhythm with 1/6 systolic murmur  without rubs or gallops.  Pulses are 2+ and equal.  LUNGS:  Some right base crackles.  Otherwise, he is clear to  auscultation.  ABDOMEN:  Obese with ventral hernia, mild distension with right upper  quadrant tenderness.  Bowel sounds are 2+.  EXTREMITIES:  Trace edema noted pretibially.  NEUROLOGIC:  Cranial nerves II through XII are grossly intact.   IMPRESSION:   1. Dyspnea on  exertion.  2. Known history of coronary artery disease, status post stent to the      circumflex in 2009, status post overlapping TAXUS right coronary      artery stents in 2005.  3. History of diabetes.  4. Hypertension.  5. Hyperlipidemia.  6. Morbid obesity.  7. Renal artery stenosis with left kidney stent and urethral stent in      2009.  8. Sleep apnea.   PLAN:  This is a 60 year old morbidly obese Caucasian male with known  history of CAD who is having increasing dyspnea on exertion and has  presented with some right-sided chest discomfort.  The patient has a  known history of CAD.  His angina equivalent is right arm pain, jaw  pain, and fullness in his chest.  He talked to Dr. Cato Mulligan who felt it  was secondary to weight, but he feels that it is not getting better and  actually becoming worse.  Today while driving recycles to the street, he  got short of breath and broke out into a sweat with right shoulder pain,  which I believe is pleuritic.   Chest pain is worrisome as getting for possible ischemia as it is  getting worse.  The patient's D-dimer is elevated at 1.64.  We will get  a V/Q scan and start heparin.  The patient did take metformin today and  we will evaluate if he does have a PE.  If this is negative, we will  plan right and left cath for tomorrow.  In the interim, we will check  cholesterol, lipids, LFTs, place him back on CPAP at night for sleep  apnea.  We will hold his metformin for now and continue with insulin and  check hemoglobin A1c.      Bettey Mare. Lyman Bishop, NP      Pricilla Riffle, MD, [facility]  Electronically Signed    KML/MEDQ  D:  01/12/2009  T:  01/13/2009  Job:  [id number]   cc:   Valetta Mole. Swords, MD

## 2011-02-08 NOTE — Assessment & Plan Note (Signed)
[facility]                            CARDIOLOGY OFFICE NOTE   GILFORD, LARDIZABAL                      MRN:          [id number]  DATE:12/15/2008                            DOB:          14-Oct-1950    [name] is in for followup.  He has been stable.  He did have a  sinus infection, so that he saw Dr. Patsy Lager over the weekend and was  placed on some antibiotics.  Otherwise, he feels pretty well.  His last  laboratories included an A1c of 7.2.  He has started the maintenance  program at [facility] and cardiac rehab.  I thinks that is going well.   PHYSICAL EXAMINATION:  VITAL SIGNS:  Today, his blood pressure is 110/76  and pulse is 68.  LUNGS:  Fields are clear.  CARDIAC:  Rhythm is regular.  There is no definite edema in the lower  extremities.   Dr. Cato Mulligan has recently started him on chlorthalidone, and I reminded  the patient that he needed to have follow up of his potassium.  He will  call Dr. Cato Mulligan' office to arrange.  Meanwhile, he will continue on the  same medical regimen.  If there is any change, he will let us know.  Otherwise, we will see him in 6 months.     [name]. Adam Kill, MD, [facility]  Electronically Signed    TDS/MedQ  DD: 12/15/2008  DT: 12/16/2008  Job #: [id number]

## 2011-02-08 NOTE — Letter (Signed)
October 30, 2007    Bruce H. Swords, MD  [address] [place], [place] [address]   RE:  [name], [name]  MRN:  [id number]  /  DOB:  1951/03/31   Dear Smitty Cords:   I saw [name] the office today in followup.  He seems to be  clinically somewhat better.  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  That was highly stressful.  He is now doing his  independent things again and quite happy with what he is doing.  He  denies any ongoing chest pain or shortness of breath.  He has had his  blood pressure medicines switched and has been on Crestor.  His  rehabilitation seems to help with regard to his back and his neck, but  seems to be worse when he stops the rehabilitation.   He is scheduled to see you next week.   EXAMINATION:  VITAL SIGNS:  On exam today, the blood pressure is 140/90,  pulse is 83.  LUNG:  Fields are clear.  CARDIAC:  Rhythm is regular.  He is not a significant murmur.   EKG reveals normal sinus rhythm, left axis deviation.   He seems to be doing well.  We had a discussion about continuation of  Plavix today.  He will also be a lipid liver profile when he sees you  back in followup, as you have recently started on Crestor.  We will be  happy to see him again, if further problems arise.  Otherwise, we will  see him in 6 months in routine follow-up.    Sincerely,      Arturo Morton. Riley Kill, MD, [facility]  Electronically Signed    TDS/MedQ  DD: 10/30/2007  DT: 10/31/2007  Job #: [id number]

## 2011-02-09 ENCOUNTER — Ambulatory Visit: Payer: PRIVATE HEALTH INSURANCE

## 2011-02-09 ENCOUNTER — Encounter (HOSPITAL_COMMUNITY): Payer: Self-pay

## 2011-02-09 ENCOUNTER — Encounter: Payer: Self-pay | Admitting: Internal Medicine

## 2011-02-09 ENCOUNTER — Ambulatory Visit (INDEPENDENT_AMBULATORY_CARE_PROVIDER_SITE_OTHER): Payer: PRIVATE HEALTH INSURANCE | Admitting: Internal Medicine

## 2011-02-09 DIAGNOSIS — I10 Essential (primary) hypertension: Secondary | ICD-10-CM

## 2011-02-09 DIAGNOSIS — E119 Type 2 diabetes mellitus without complications: Secondary | ICD-10-CM

## 2011-02-09 DIAGNOSIS — I2510 Atherosclerotic heart disease of native coronary artery without angina pectoris: Secondary | ICD-10-CM

## 2011-02-09 DIAGNOSIS — I2699 Other pulmonary embolism without acute cor pulmonale: Secondary | ICD-10-CM

## 2011-02-09 DIAGNOSIS — E785 Hyperlipidemia, unspecified: Secondary | ICD-10-CM

## 2011-02-09 LAB — POCT INR: INR: 2.3

## 2011-02-09 NOTE — Assessment & Plan Note (Signed)
Goal INR 2.5-3.0  He missed a few 7.5 mg doses

## 2011-02-09 NOTE — Assessment & Plan Note (Signed)
Controlled Continue meds 

## 2011-02-09 NOTE — Assessment & Plan Note (Signed)
No sxs Continue meds 

## 2011-02-09 NOTE — Assessment & Plan Note (Signed)
F/u 2 months 

## 2011-02-09 NOTE — Progress Notes (Signed)
  Subjective:    Patient ID: [name], male    DOB: 12/27/1950, 60 y.o.   MRN: [id number]  HPI  Patient Active Problem List  Diagnoses  . DIABETES MELLITUS-TYPE II---tolerating meds-CBGs at home--110-180  . HYPERLIPIDEMIA---tolerating meds  . OBESITY-MORBID (>100')--has not tried to lose weight  . 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 DEPRESSION---tolerating meds  . SLEEP APNEA, OBSTRUCTIVE  . HYPERTENSION---tolerating meds  . CORONARY ARTERY DISEASE---no CP, SOB, pnd  . PULMONARY EMBOLISM--no sxs  .   .   .   .   .   .   Marland Kitchen    Past Medical History  Diagnosis Date  . HYPERLIPIDEMIA 04/12/2007  . HYPERTENSION 04/12/2007  . CORONARY ARTERY DISEASE 04/12/2007  . GERD 04/12/2007  . DIABETES MELLITUS, TYPE II, UNCONTROLLED 06/08/2007  . SLEEP APNEA, OBSTRUCTIVE 01/16/2009  . RENAL ARTERY STENOSIS 01/16/2009  . ESOPHAGEAL STRICTURE 01/16/2009  . PSORIASIS 01/16/2009  . PULMONARY EMBOLISM 01/20/2009  . 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 NEPHROLITHIASIS, HX OF 02/27/2009  . DEPRESSION 03/08/2009  . DYSPNEA 03/22/2010  . ANEMIA-UNSPECIFIED 04/28/2010  . DIABETES MELLITUS-TYPE II 09/01/2010  . Atherosclerosis of renal artery   . Coronary atherosclerosis of unspecified type of vessel, native or graft     s/p DES to RCA x2 in 2005; s/p DES to CFX 2009; s/p DES PTCA of RCA for  in stent restenosis 03/2010; s/p Promus DES x2 07/16/10(recurrent pain with abnormal Myoview oct 18,2011; residual at cath 07/16/10: LAD 30%; CFX 30%; RCA 30% ISR.   Past Surgical History  Procedure Date  . Knee arthroscopy 2007  . Rca stent     x2  . Angioplasty 2005 & 2011  . Coronary angioplasty with stent placement   . Esophagogastroduodenoscopy     reports that he 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 He does not have any smokeless tobacco history on file. He reports that he does not drink alcohol or use illicit drugs. family history includes Diabetes in his brother; Factor V Leiden deficiency in his  mother; Heart failure in his brother; Hypertension in his brother; and Pulmonary embolism in his mother. Allergies  Allergen Reactions  . Piroxicam     REACTION: unspecified: blistering on hands  . Rofecoxib     REACTION: Reaction not known     Review of Systems  patient denies chest pain, shortness of breath, orthopnea. Denies lower extremity edema, abdominal pain, change in appetite, change in bowel movements. Patient denies rashes, musculoskeletal complaints. No other specific complaints in a complete review of systems.      Objective:   Physical Exam  well-developed well-nourished male in no acute distress. HEENT exam atraumatic, normocephalic, neck supple without jugular venous distention. Chest clear to auscultation cardiac exam S1-S2 are regular. Abdominal exam overweight with bowel sounds, soft and nontender. Extremities no edema. Neurologic exam is alert with a normal gait.        Assessment & Plan:

## 2011-02-10 ENCOUNTER — Ambulatory Visit: Payer: PRIVATE HEALTH INSURANCE

## 2011-02-11 ENCOUNTER — Encounter (HOSPITAL_COMMUNITY): Payer: Self-pay

## 2011-02-11 NOTE — Discharge Summary (Signed)
NAME:  [name], [name]                         ACCOUNT NO.:  [id number]   MEDICAL RECORD NO.:  [id number]                   PATIENT TYPE:  REC   LOCATION:  REHS                                 FACILITY:  [facility]   PHYSICIAN:  Guy Franco, P.A. [facility]                DATE OF BIRTH:  1951-05-07   DATE OF ADMISSION:  05/24/2004  DATE OF DISCHARGE:  06/14/2004                           DISCHARGE SUMMARY - REFERRING   DISCHARGE DIAGNOSES:  1.  Chest pain, resolved.  2.  Coronary artery disease.  3.  Diabetes mellitus, on oral agents.  4.  Hypertension, treated.  5.  Hyperlipidemia, treated.  6.  Obesity.   HISTORY OF PRESENT ILLNESS:  The patient is a 60 year old male patient with  a known history of coronary artery disease.  He suffered a non-Q-wave  inferior myocardial infarction on March 31, 2004.  He underwent two stent  placements to the right coronary artery.  At that time he was found to have  a residual circumflex/OM lesion of 70%.  This was treated medically.  He  began having chest pain at 2 a.m. on the date of admission.  He did not feel  like it was similar to his previous chest pain.   HOSPITAL COURSE:  Because of his history and known disease, we did elect to  take him to the cardiac catheterization laboratory urgently.  He was found  to have no stenosis in the stents of the right coronary artery.  He did  still have a 70% mid-circumflex lesion.  There was a 30% proximal LAD  lesion, and a normal ejection fraction.  There was no source of ischemia,  and the patient was reassured.   DISPOSITION:  We discharged him home and wanted him to have an outpatient  Cardiolite performed to assess this circumflex to look for any ischemia.  Then to have him follow back with Dr. Arturo Morton. [place].  He was angioceled.   DISCHARGE INSTRUCTIONS:  At this point the patient was given discharge  instructions regarding care of the angioceled area.  Also will contact the  patient regarding  outpatient Cardiolite.  The formal discharge/pink sheet is not available at this time.  It is  unclear if this was actually performed.  I do see that the patient did  receive home care instructions and follow-up instructions for post-hospital  care.       LB/MEDQ  D:  06/14/2004  T:  06/14/2004  Job:  [id number]   cc:   Arturo Morton. Riley Kill, M.D. [facility]   Ranelle Oyster, M.D.  [address] [place]  [place] [address]  Fax: [phone]

## 2011-02-11 NOTE — Assessment & Plan Note (Signed)
[facility]                            CARDIOLOGY OFFICE NOTE   CLENTON, ESPER                        MRN:          [id number]  DATE:12/18/2006                            DOB:          [birth year]    [name] is in for followup.  In general, he has been relatively stable,  although he has been gaining weight because he has been generally  inactive.  Overall, he denies any chest pain.  He has not been able to  do much since he had his knee surgery.  He has not had much in the way  of chest pain, but he has had a little bit of lower extremity edema.   On his examination, his weight is 279 pounds; that is up about 7 pounds  from November.  Blood pressure is 156/84, and the pulse is 67.  The lung  fields are actually clear.  There is not a significant murmur.  There is  mild 1+ lower extremity edema.  He does have a small area of bluish  discoloration in the low back.  His peripheral pulses are intact.   His electrocardiogram today demonstrates normal sinus rhythm.  There is  a leftward oriented axis.   The patient has had prior percutaneous intervention of the right  coronary.  He has residual circumflex disease.  He has had knee surgery  and has not quite gained his activity back.  He does have some lower  extremity edema, and with the shortness of breath I think it would be  appropriate to do some lower extremity Dopplers to make sure he does not  have a DVT.  We will try to get this done within the next few days.     [name]. Riley Kill, MD, [facility]  Electronically Signed    TDS/MedQ  DD: 12/29/2006  DT: 12/29/2006  Job #: [id number]

## 2011-02-11 NOTE — H&P (Signed)
[name], [name]               ACCOUNT NO.:  [id number]   MEDICAL RECORD NO.:  [id number]          PATIENT TYPE:  INP   LOCATION:  3731                         FACILITY:  [facility]   PHYSICIAN:  Jonelle Sidle, M.D. LHCDATE OF BIRTH:  06/14/1951   DATE OF ADMISSION:  09/30/2005  DATE OF DISCHARGE:                                HISTORY & PHYSICAL   PRIMARY CARDIOLOGIST:  Arturo Morton. Riley Kill, M.D. [facility].   PRIMARY CARE PHYSICIAN:  Valetta Mole. Swords, M.D. [facility].   [name] is a very pleasant 60 year old Caucasian male with known history  of coronary artery disease, status post __________  , in July 2005, in  [place] with a cath at that time x2 TAXUS drug-eluting stents to the right  coronary artery.  Repeat catheterization, in August 2005, for an episode of  chest pain which we actually did here showing patent stents.  [name]  recently was seen by Dr. Riley Kill, on September 01, 2005, for a yearly exam.  At that time, [name] was doing fine from a cardiac view.  He presents  to the [facility] Emergency Room this admission complaining of chest pain x1  week.  He describes it as an intermittent chest discomfort as a dull ache  rating around an 8 on a scale of 1 to 10, not associated consistently with  any activity.  He has it with exertion and at rest.  [name] is very  sedentary, however, he does climb steps frequently at his job.  He does not  see a correlation in his activity with the chest discomfort.  He does  complain of mild dyspnea on exertion and has had episodes of mild nausea  over the last week.  The chest discomfort is localized to his left breast.  At time it radiates into his left shoulder and up into his left upper jaw  and his teeth ache.  He also complains of increased fatigue x1 month and x1  syncopal episode last Thursday, I believe.  He states he was resting in a  recliner.  He stood up to stand, upon standing he completely lost  consciousness, fell face  forward into the floor.  His wife states he was out  approximately 20 seconds.  Denied any dizziness or lightheadedness with  episode.  Denied any chest pain at that time.  No further episodes of  syncope or presyncope.  Currently, [name] has received IV morphine and  nitroglycerin drip infusing and he is pain free.   ALLERGIES:  FELDENE.   MEDICATIONS:  1.  Metformin 1,000 mg p.o. b.i.d.  2.  Actos 45 mg daily.  3.  Metoprolol 25 mg b.i.d.  4.  Lexapro 10 mg daily.  5.  Lisinopril 20 mg daily.  6.  Nexium 40 mg daily.  7.  Plavix 75 mg daily.  8.  Aspirin 81 mg daily.  9.  Vytorin 10/40 mg daily.  10. Nitroglycerin p.r.n.   Here in the emergency room he has received aspirin, 2 mg of morphine, and  nitroglycerin drip.   PAST MEDICAL HISTORY:  1.  Coronary artery disease, status post __________  cardiac catheterization      x2 drug-eluting stents to the right coronary artery in July 2005.      Followup cardiac catheterization, August 2005, showing patent stents.  A      followup stress test, December 2005, negative for ischemia with an EF of      60%.  2.  Non-insulin-dependent diabetes x12 years.  3.  Obesity.  4.  Hiatal hernia.  5.  Depression.  6.  Hypercholesterolemia.  7.  New diagnosis obstructive sleep apnea requiring CPAP.  8.  Arthritis.  9.  Hypertension.   SOCIAL HISTORY:  The patient lives in [place] with his wife.  He is a  Technical sales engineer.  He has two adult children.  Denies ever using  tobacco and negative for ETOH, drug, or herbal medication use.  He tries to  follow a heart healthy, diabetic diet, poor compliance at times.  Exercise:  None.   FAMILY HISTORY:  Mother alive with hypertension, asthma.  Father alive and  well.  Siblings:  He has a brother with diabetes and who has had several  exacerbations and CHF.   REVIEW OF SYSTEMS:  Positive for sweats, chest pain, dyspnea on exertion,  trace peripheral edema, syncopal episode,  occasional cough. GI:  Positive  for nausea.  All other systems negative per patient.   PHYSICAL EXAMINATION:  VITAL SIGNS:  Temp 97.4, pulse is 80, respirations  are 18, blood pressure 105/66, sat 95% on room air.  GENERAL:  He is in no acute distress, very pleasant, cooperative, obese, 28-  year-old, Caucasian male.  HEENT:  Pupils are equal, round, and reactive to light.  Sclerae is clear.  NECK:  Supple without lymphadenopathy.  Negative bruit or JVD.  CARDIOVASCULAR:  Heart rate regular rhythm.  S1 and S2.  Pulses are 2+  without bruits.  LUNGS:  Clear to auscultation bilateral.  Poor inspiratory effort.  ABDOMEN:  Soft, nontender.  Positive bowel sounds.  He has a very obese  abdomen.  EXTREMITIES:  Without clubbing, cyanosis, or edema.  He has 2+ DPs  bilateral.  NEUROLOGIC:  Alert and oriented x3.  Cranial nerves II-XII grossly intact.   Chest x-ray showing new opacity in the left lower base, likely atelectasis,  can not rule out early infiltrate, recommend followup film.  EKG at a rate  of 83, normal sinus rhythm, without ST-T wave changes.   LABORATORY:  CBC:  White blood cell count 5.7, hemoglobin 12.4, hematocrit  36.2, with a platelet count of 263,000.  Sodium 137, potassium 4.1, BUN 17,  creatinine 0.9, with a glucose of 256.  Point-of-care markers:  Troponin  less than 0.05 x2.  PT 12 with an INR of 0.9.   Dr. Simona Huh in to examine and assess the patient with:  1.  What sounds like unstable angina in the setting of known coronary artery      disease, two drug-eluting stents to the right coronary artery, also with      a syncopal episode times one.  2.  Diabetes.  3.  Obesity.   1.  The patient will be admitted to rule out myocardial infarction.  2.  Anticoagulate with heparin.  3.  Continue nitroglycerin drip.  4.  Continue home medications except for Glucophage which we will hold.  5.  At this time we will observe the patient.  6.  Monitor cardiac  enzymes. 7.  Stress test versus cath.  However,  we were leaning towards a cardiac      catheterization at this time.  We will further discuss with the patient      in the a.m.      Dorian Pod, NP    ______________________________  Jonelle Sidle, M.D. [facility]    MB/MEDQ  D:  09/30/2005  T:  09/30/2005  Job:  [id number]

## 2011-02-11 NOTE — Assessment & Plan Note (Signed)
[facility]                                   ON-CALL NOTE   NAME:SHELTONMoshe, Wenger                      MRN:          [id number]  DATE:08/02/2006                            DOB:          [birth year]    TIME OF CALL:  5:25 p.m.   CALLER:  Jacki Cones from [facility] at [phone].   REASON FOR CALL:  Call is regarding lab report.  She is calling to say that  stat D-dimer test which was drawn by Dr. Cato Mulligan today was within the normal  range at 0.22.  She is calling because she needs to let the doctor on call  know about the stat result.  I told her since it was normal, I would let Dr.  Cato Mulligan know first thing in the morning, and they will also fax a report to  the office.    ______________________________  Audrie Gallus. Tower, MD    MAT/MedQ  DD: 08/02/2006  DT: 08/03/2006  Job #: [id number]   cc:   Valetta Mole. Swords, MD

## 2011-02-11 NOTE — Consult Note (Signed)
[facility]                            ENDOCRINOLOGY CONSULTATION   NAME:[name], [name]                      MRN:          [id number]  DATE:05/04/2006                            DOB:          05-06-1951    REFERRING PHYSICIAN:  Valetta Mole. Swords, MD   REFERRING PHYSICIAN:  Birdie Sons, MD   REASON FOR REFERRAL:  Diabetes.   HISTORY OF PRESENT ILLNESS:  60 year old man, with a 12-year history of  diabetes.  His only known complication is coronary disease.  He started on  insulin, just three days ago, due to a failure of oral agents.  He currently  is continuing his oral agents, as well as taking Lantus, 20 units a day.  His glucose has continued to be in the 200's.  Symptomatically, he has  several years of intermittent abdominal cramping, but no associated numbness  of the feet.  He describes his diet as good, and his exercise is slightly  better.  He also has a number of symptoms today, which do not appear to be  diabetes-related.   PAST MEDICAL HISTORY:  1. Dyslipidemia.  2. Depression.  3. Hypertension.  4. GERD.  5. CAD, as noted above.   SOCIAL HISTORY:  He is married.  He works as a Technical sales engineer.   FAMILY HISTORY:  Positive for diabetes in his brother.   REVIEW OF SYSTEMS:  He denies weight gain and vomiting.   PHYSICAL EXAMINATION:  BLOOD PRESSURE:  112/76.  HEART RATE:  73.  TEMPERATURE:  99.4  WEIGHT:  256.  GENERAL:  No distress.  He is obese.  SKIN:  Not diaphoretic.  I do not see a rash.  HEENT:  No proptosis, no periorbital scrubbing.  PHARYNX:  No erythema.  NECK:  No goiter.  CHEST:  Clear to auscultation.  No respiratory distress.  CARDIOVASCULAR:  No JVD.  1+ bilateral pretibial edema, regular rate and  rhythm.  No murmur.  CAROTID ARTERIES:  No bruit.  PEDAL PULSES:  Decreased and normal, perhaps due to his edema.  FEET:  Normal color and temperature.  There is no ulcer present on the feet.  NEUROLOGIC:  Alert and oriented.  Does not appear anxious, nor depressed.  SENSATION:  Intact in touch in the feet.   IMPRESSION:  1. I agree with Dr. Cato Mulligan, that he needs to continue increasing his      insulin.  2. Abdominal cramps, may be caused by his metformin.  3. Edema, probably caused by the Actoplus insulin.   PLAN:  1. Discontinue your oral medications.  2. Increased Lantus to 30 units a day.  3. Continue increasing the Lantus until your glucose's are in the low      100's.  4. Return in two weeks.  5. I discussed with him the risk of diabetes to his health.  6. I also discussed with him the fact that we will probably need to change      the type of insulin he is taking at his return appointment, but that      would depend on the  pattern of his home glucose's throughout the day.                                   Sean A. Everardo All, MD   SAE/MedQ  DD:  05/04/2006  DT:  05/04/2006  Job #:  [id number]   cc:   Valetta Mole. Swords, MD

## 2011-02-11 NOTE — Cardiovascular Report (Signed)
NAMEFERRELL, CLAIBORNE                         ACCOUNT NO.:  [id number]   MEDICAL RECORD NO.:  [id number]                   PATIENT TYPE:  INP   LOCATION:  1830                                 FACILITY:  [facility]   PHYSICIAN:  Charlies Constable, M.D. [facility]              DATE OF BIRTH:  13-May-1951   DATE OF PROCEDURE:  05/04/2004  DATE OF DISCHARGE:                              CARDIAC CATHETERIZATION   INDICATIONS FOR PROCEDURE:  The patient is 60 years old and was recently  hospitalized in [place], [place], with a non-ST elevation infarction and  had two overlapping Taxis stents placed in the right coronary artery.  He  was found to have residual 70% stenosis in the circumflex artery.  He came  to the emergency room today with chest pain which was different than his MI  pain.  Today's pain was left side and somewhat worse with inspiration.  He  was seen by Jonelle Sidle, M.D. [facility] and arrangements were made for him  to come to the lab for evaluation with angiography.   DESCRIPTION OF PROCEDURE:  The procedure was performed via the left femoral  artery using arterial sheath and 6 French preformed coronary catheters.  A  front wall arterial puncture was performed and Omnipaque contrast was used.  The left femoral artery was closed with Angioseal at the end of the  procedure.  The patient tolerated the procedure well and left the laboratory  in satisfactory condition.   RESULTS:  1. The aortic pressure was 132/91 with a mean of 108.  Left ventricular     pressure was 132/21.  2. Left main coronary artery was free of significant disease.  3. Left anterior descending artery gave rise to two diagonal branches and     three septal perforators.  The LAD was irregular and there was 30     narrowing and 30% stenosis in the proximal vessel.  4. The circumflex artery gave rise to two marginal branches, atrial branch,     and a posterolateral branch. There was 70% narrowing in the midvessel  before the posterolateral branch.  5. The right coronary artery was a large dominant vessel that gave rise to     two right ventricular branches, a posterior descending branch, and a     posterolateral branch.  There was a stent that extended from the proximal     vessel to about 2/3 of the way down the vessel.  There was 0% stenosis     within the stents.  There were irregularities in the distal vessel.  6. The left ventriculogram performed in the RAO projection showed good wall     motion with no areas of hypokinesis.  The estimated ejection fraction was     60%.   CONCLUSION:  Coronary artery disease status post recent non-ST elevation  infarction and stenting of the right coronary artery with 30% narrowing in  the  proximal LAD, 70% narrowing in the mid to distal circumflex artery, 0%  stenosis at the overlapping Taxis stents in the right coronary artery, and  normal LV function.   RECOMMENDATIONS:  Reassurance.  Will plan follow-up evaluation of Cardiolite  scan and an office visit with Arturo Morton. Riley Kill, M.D. [facility].  We plan  discharge later today.                                               Charlies Constable, M.D. [facility]   BB/MEDQ  D:  05/04/2004  T:  05/05/2004  Job:  [id number]   cc:   Valetta Mole. Swords, M.D. [facility]

## 2011-02-11 NOTE — Consult Note (Signed)
[facility]  Patient:    [name], [name]                      MRN: [id number] Proc. Date: 07/02/00 Adm. Date:  [id number] Disc. Date: [id number] Attending:  Judie Petit CC:         Osvaldo Human, M.D.  Corwin Levins, M.D. [facility]  [facility]   Consultation Report  ADDENDUM:  Shortly after leaving the emergency room, Dr. Ignacia Palma called me, and stated that the patient had a four beat run of V-tach.  At that time, I talked with the nurse and told her that I was going to admit the patient because of the change in his EKG.  I then spoke to the patient over the phone. He told me that he was going to refuse to stay.  I strongly recommended that he stay just to have this evaluated as an inpatient.  He refused.  Again, I encouraged the nurse to ask him to stay.  If he is going to leave, he will have sign to [facility] paper, and then we will continue the workup as an outpatient, although that is not my first choice of how to further evaluate this.  The patient understands this. DD:  07/02/00 TD:  07/02/00 Job: [id number]

## 2011-02-11 NOTE — Procedures (Signed)
[name], Adam NO.:  [id number]   MEDICAL RECORD NO.:  [id number]          PATIENT TYPE:  OUT   LOCATION:  SLEEP CENTER                 FACILITY:  [facility]   PHYSICIAN:  Marcelyn Bruins, M.D. [facility] DATE OF BIRTH:  1951/03/30   DATE OF STUDY:  09/07/2005                              NOCTURNAL POLYSOMNOGRAM   REFERRING PHYSICIAN:  Bruce H. Swords, M.D. [facility].   DATE OF STUDY:  September 07, 2005.   INDICATION FOR STUDY:  Hypersomnia with sleep apnea.   EPWORTH SLEEPINESS SCORE:  5.   SLEEP ARCHITECTURE:  The patient had a total sleep time of 263 minutes with  very little REM and never achieved slow wave sleep. Sleep onset latency was  at the upper limits of normal and the REM onset was very prolonged at 304  minutes. Sleep efficiency was poor at 69%.   RESPIRATORY DATA:  The patient was found to have 89 hypopneas and 24 apneas  for a respiratory disturbance index of 26 events per hour. The events were  not positional but there was moderate to loud snoring noted throughout. The  patient did not meet split night criteria secondary to the majority of his  events occurring after midnight.   OXYGEN DATA:  The patient had an O2 desaturation as low as 87% with his  obstructive events.   CARDIAC DATA:  No clinically significant cardiac arrhythmias.   MOVEMENT/PARASOMNIAS:  Rare leg jerks without clinical significance.   IMPRESSION/RECOMMENDATIONS:  Moderate obstructive sleep apnea/hypopnea  syndrome with a respiratory disturbance index of 26 events per hour and O2  desaturation as low as 87%. Treatment for this degree of sleep apnea may  include weight loss alone if applicable, upper airway surgery, oral  appliance, or C-PAP. Clinical correlation is suggested.           ______________________________  Marcelyn Bruins, M.D. [facility]  Diplomate, American Board of Sleep  Medicine    KC/MEDQ  D:  09/14/2005 15:45:33  T:  09/15/2005 08:58:56  Job:  [id number]

## 2011-02-11 NOTE — Discharge Summary (Signed)
NAMEADAL, SERENO               ACCOUNT NO.:  [id number]   MEDICAL RECORD NO.:  [id number]          PATIENT TYPE:  INP   LOCATION:  3731                         FACILITY:  [facility]   PHYSICIAN:  Arvilla Meres, M.D. LHCDATE OF BIRTH:  Jun 09, 1951   DATE OF ADMISSION:  09/30/2005  DATE OF DISCHARGE:  10/01/2005                                 DISCHARGE SUMMARY   DISCHARGE DIAGNOSIS:  Atypical chest discomfort.   SUMMARY OF HISTORY:  Mr. Samson is a 60 year old male who presented with a  one-week history of intermittent chest discomfort with radiation to the  teeth and jaw, with mild dyspnea on exertion, nausea, he describes as a dull  and 8 on a scale of 0/10 with mild relief with nonsteroidals.  He also  describes a one-month history of fatigue and a syncopal episode on the  preceding week.   PAST MEDICAL HISTORY:  1.  Type 2 diabetes.  2.  Known coronary artery disease with two drug-eluting stents.  Last      catheterization in August 2005 showed these stents to be patent.  3.  Obstructive sleep apnea on CPAP.  Last stress Myoview was negative in      December 2005.  4.  Arthritis.  5.  Hypertension.  6.  Depression.  7.  Hyperlipidemia.  8.  Hiatal hernia.   LABORATORY DATA:  Fasting lipids showed a total cholesterol of 156,  triglycerides 368, HDL 27, LDL 55.  CK, MB and troponins were negative x3.  BNP was less than 30.  Admission sodium was 137, potassium 4.1, BUN 17,  creatinine 0.9.  Normal LFTs except for an alkaline phosphatase that was  slightly elevated at 128.  Admission H&H was 12.4 and 36.2, normal indices,  platelets 263, WBC 5.7.  PT 12.0, PTT 37.  Chest x-ray:  New opacity at the  left lung base, likely atelectasis, cannot rule out consolidation.  Recommended follow-up imaging.  EKG showed sinus rhythm, left axis  deviation, first degree AV block, nonspecific ST-T wave changes.   HOSPITAL COURSE:  Mr. Pellecchia was placed on IV heparin and admitted for  further evaluation.  Overnight he ruled out for myocardial infarction.  Dr.  Gala Romney saw the patient on October 11, 2005, and the patient insisted upon  being discharged as he did not think that this was angina.  Dr. Gala Romney  agreed and felt that he could be discharged home with outpatient stress  Myoview and early follow-up.  Risks and benefits were discussed.  Repeat PA  and lateral chest x-ray was performed prior to discharge for evaluation of  new findings (report is not on the chart at the time of dictation).  Dr.  Gala Romney felt if this was negative that the patient could be discharged  home.   He was asked to maintain a low salt, fat and cholesterol ADA diet.   His medications remain unchanged from admission.  These include:  1.  Metformin 1000 mg b.i.d.  2.  Actos 45 mg daily.  3.  Metoprolol 25 mg b.i.d.  4.  Lexapro 10 mg daily.  5.  Lisinopril 20 mg daily.  6.  Nexium 40 mg daily.  7.  Aspirin 81 mg daily.  8.  Vytorin 10/40 mg q.h.s.  9.  Nitroglycerin 0.4 mg as needed.  10. Plavix 75 mg daily.   The office will call him with arrangements for a stress Myoview and a follow-  up appointment with Dr. Riley Kill.  He was also asked to call Dr. Cato Mulligan for a  follow-up appointment.   TIME:  Less than 30 minutes.      Joellyn Rued, P.A. [facility]      Arvilla Meres, M.D. [facility]  Electronically Signed    EW/MEDQ  D:  12/14/2005  T:  12/15/2005  Job:  [id number]   cc:   Valetta Mole. Swords, M.D. [facility]  [address] [place]  [place] [address]   Arturo Morton. Riley Kill, M.D. [facility]  [address]  [address]  [place]  [place] [address]

## 2011-02-11 NOTE — H&P (Signed)
[name], [name]               ACCOUNT NO.:  [id number]   MEDICAL RECORD NO.:  [id number]          PATIENT TYPE:  INP   LOCATION:  1305                         FACILITY:  [facility]   PHYSICIAN:  [name], M.D. [facility] OF BIRTH:  May 27, 1951   DATE OF ADMISSION:  12/25/2005  DATE OF DISCHARGE:                                HISTORY & PHYSICAL   REASON FOR ADMISSION:  Renal failure.   HISTORY OF PRESENT ILLNESS:  A 60 year old man with five days of nausea,  vomiting, and diarrhea. He has associated noncrampy quality, pain over the  entire abdomen. He had associated noncrampy quality pain over the entire  abdomen. He states he has also lost 25 pounds in the last 30 days.   PAST MEDICAL HISTORY:  1.  Type 2 diabetes.  2.  Hypertension.  3.  Depression.  4.  Dyslipidemia.  5.  CAD.  6.  Urolithiasis.   His creatinine was normal a month ago.   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   FAMILY HISTORY:  His son also has diarrhea.   REVIEW OF SYSTEMS:  Denies the following--hematuria, rectal bleeding, loss  of consciousness, dysuria, back pain, chest pain, shortness of breath, sore  throat, ear ache, fever, rash, and seizure.   PHYSICAL EXAMINATION:  VITAL SIGNS: Blood pressure 98/56, heart rate 69,  temperature 97.6, respiratory rate 18.  GENERAL: In no acute distress.  SKIN: Not diaphoretic. I do not see a rash.  HEENT: No scleral icterus. Pharynx reveals mucous membranes slightly dry. No  erythema.  NECK: Supple.  CHEST: Clear to auscultation.  CARDIOVASCULAR: No JVD, no edema. Regular rate and rhythm. No murmur. Pedal  pulses are intact.  ABDOMEN: Soft, obese, nontender. No hepatosplenomegaly. No masses. It is not  distended.  GENITORECTAL: Not done at this time due to the patient's condition.  EXTREMITIES: No deformities seen.  NEUROLOGIC: Alert and oriented. Cranial nerves appear to be intact and he  readily moves [name] four  extremities.   LABORATORY STUDIES:  CBC is normal. Sodium 138, potassium 3.4, chloride 106,  carbon dioxide 20, glucose 147, BUN 41, creatinine 4.4. Urinalysis normal  except for protein.   IMPRESSION:  1.  Renal failure of uncertain etiology.  2.  Nausea, vomiting, and diarrhea. It is uncertain what relationship, if      any, this has with #1.  3.  Type 2 diabetes which could have contributed to his renal failure.  4.  Hypertension for which he takes Zestril which could have contributed to      his renal failure.  5.  History of urolithiasis.  6.  Weight loss. It is uncertain what relationship if any this has to his      renal failure.   PLAN:  1.  Intravenous fluids.  2.  Recheck labs tomorrow morning.  3.  Discontinue Zestril, oral antibiotic agents, and Vytorin.  4.  Symptomatic therapy.  5.  Recheck renal ultrasound tomorrow.  6.  Change to insulin.  7.  I discussed code status with the patient and  he states he does not wish      to make any decision right now.  8.  Check TSH to evaluate weight loss.           ______________________________  [name] Everardo [name], M.D. [facility]     SAE/MEDQ  D:  12/25/2005  T:  12/26/2005  Job:  [id number]

## 2011-02-11 NOTE — Consult Note (Signed)
[employer] [facility]  Patient:    [name], [name]                      MRN: [id number] Adm. Date:  [id number] Disc. Date: [id number] Attending:  Judie Petit CC:         Osvaldo Human, M.D.  Corwin Levins, M.D. [facility]   Consultation Report  REFERRING PHYSICIAN:  Carleene Cooper III, M.D.  CHIEF COMPLAINT:  Chest discomfort.  HISTORY OF PRESENT ILLNESS:  [name] is a 60 year old male who comes to the ER for evaluation of chest pain.  The patient was seen and examined apparently by Osvaldo Human, M.D., who asked me to come and see the patient.  [name] reports to me a four to five-day history of chest discomfort which occurs intermittently.  He states only at rest will he develop a vague chest discomfort which can radiate to his neck ("in my throat").  This usually comes on and lasts for approximately one minute or less and resolves spontaneously.  The patient has been active this week doing his usual job.  He has never had any exertional chest discomfort and has never had any discomfort that has lasted for more than minute.  This morning he woke up with discomfort.  The discomfort has always been mild and this morning it was mild.  Today the discomfort was exactly the same as it has been, but lasted for quite some time.  He continued to do his daily activities.  He went to church, came home, and had lunch.  The discomfort lasted for approximately 10 hours, so he decided to come to the emergency room.  He was given a nitroglycerin, which did not have any effect initially, but 30 minutes or an hour later he noted that he had no discomfort.  He has never had any associated symptoms like shortness of breath, diaphoresis, nausea, or vomiting.  He has never had any exertional chest discomfort.  He states that this discomfort is very similar to the discomfort that he was having when he had his hiatal hernia pain.  PAST MEDICAL HISTORY:   Significant for hiatal hernia, diabetes, and osteoarthritis.  CURRENT MEDICATIONS:  Glucophage, Actos, Altace, and Lipitor.  Does not know the doses.  He takes an aspirin a day.  SOCIAL HISTORY:  He works as a Technical sales engineer.  He is married.  He used to walk two miles a day up until three to four months ago.  He is a nonsmoker.  FAMILY HISTORY:  His mother and father are living and well.  A brother and sister are alive and well without any diabetes or coronary artery disease known in the family.  REVIEW OF SYSTEMS:  He denies any abdominal pain, dysuria, lower extremity edema, neurologic complaints, or any other complaints in the review of systems.  PHYSICAL EXAMINATION:  Temperature 98.4 degrees, pulse 81, respirations 20, blood pressure 138/80.  GENERAL APPEARANCE:  He is a well-developed, well-nourished male in no acute distress.  He is moderately overweight.  HEENT:  Normocephalic and atraumatic.  Extraocular muscles are intact.  Pupils are equally, round, and reactive to light.  The oropharynx is moist.  NECK:  Supple without lymphadenopathy, thyromegaly, jugular venous distention, or carotid bruits.  CHEST:  Clear to auscultation without any increased work of breathing.  CARDIAC:  S1 and S2 are normal.  The PMI is normal.  There are no significant murmurs or gallops.  He has chest discomfort to palpation of the lower sternum, which exactly reproduces the discomfort that he has.  ABDOMEN:  Active bowel sounds.  Soft and nontender.  There is no hepatosplenomegaly.  No masses are palpated.  EXTREMITIES:  There is no clubbing, cyanosis, or edema.  NEUROLOGIC:  He is alert and oriented.  LABORATORY DATA:  The EKG demonstrates a normal sinus rhythm with occasional PVC, otherwise normal.  ISTAT:  Glucose 136, BUN 15, sodium 144, potassium 3.7, chloride 107.  Anion gap 16.  The pH is 7.406 and pCO2 39.8.  The creatinine is 1.1.  The CBC is essentially normal,  except for a hemoglobin of 12.5, MCV 82.4, and platelet count 273,000.  The CK total is 134, the CK-MB is 1.7, relative index 1.3, and the troponin I is 0.01.  The chest x-ray demonstrates no active disease.  ASSESSMENT AND PLAN:  Chest discomfort which is atypical for coronary disease in a 60 year old male with other risk factors (diabetes and hyperlipidemia). He does need further evaluation.  I discussed the options with him in detail. His discomfort is atypical enough he is pain-free.  I do not think that his symptoms and story are consistent with coronary disease, but given his risk factors it is certainly a possibility.  He does need further evaluation.  I think it is safe to do this as an outpatient.  Will use Protonix 40 mg p.o. q.d. empirically.  He is given a prescription for nitroglycerin 0.4 mg one sublingual every five minutes p.r.n. chest pain.  He is advised to come back to the emergency room for any further chest discomfort.  We will schedule a stress test for him tomorrow. DD:  07/02/00 TD:  07/02/00 Job: [id number]

## 2011-02-11 NOTE — Assessment & Plan Note (Signed)
[facility]                            CARDIOLOGY OFFICE NOTE   NAME:[name], [name]                      MRN:          [id number]  DATE:08/25/2006                            DOB:          25-Mar-1951    PRIMARY CARE PHYSICIAN:  Valetta Mole. Swords, M.D.   CARDIOLOGIST:  Arturo Morton. Riley Kill, MD, [facility]   HISTORY OF PRESENT ILLNESS:  [name] is a 60 year old male patient  with a history of coronary disease, status post non-ST elevation  myocardial infarction in [place], [place] in July, 2005, treated  with Taxus drug-eluting stents x2 to the RCA.  The patient is in need of  left knee arthroscopic surgery.  He presents to the office today for  perioperative clearance.  He denies any chest discomfort, shortness of  breath, arm or jaw discomfort, nausea or diaphoresis, syncope,  presyncope, orthopnea, paroxysmal nocturnal dyspnea.  He has had quite a  bit of swelling in his left knee, extending down his leg.  He has  apparently had some venous Dopplers performed, which have been negative.   PAST MEDICAL HISTORY:  As noted above.  Significant for coronary  disease, status post Taxus drug-eluting stent to the RCA in July, 2005  in [place], [place].  He underwent relook catheterization on May 04, 2004 that revealed a 30% proximal stenosis in the LAD, 70% narrowing  of the mid vessel in the circumflex, and patent stents in the RCA.  His  EF was 60%.  He had an admission to [facility] for chest pain  at the beginning of this year.  He had a Myoview study in January, 2007  that revealed an EF of 58% with no ischemia.  He has a history of  diabetes mellitus, hyperlipidemia, hypertension, gastroesophageal reflux  disease, nephrolithiasis.  He was admitted to [facility] in  April, 2007 with nausea, vomiting, and diarrhea resulting in acute renal  failure.  His renal function returned to normal with resolution of his  disease.  He is  status post left knee arthroscopic surgery some 25 years  ago.   PLAN:   ALLERGIES:  FELDENE.   MEDICATIONS:  1. Metoprolol 25 mg b.i.d.  2. Lexapro 10 mg per day.  3. Aspirin 81 mg daily.  4. Plavix 75 mg daily.  5. Multivitamin daily.  6. Lisinopril 20 mg daily.  7. Actosplus Met 15/500 mg daily.  8. Glyburide 5 mg daily.  9. Lantus 20 units daily.  10.Allegra 60 mg daily.  11.Nitroglycerin p.r.n. chest pain.   SOCIAL HISTORY:  The patient denies any tobacco abuse.  He works as a  Technical sales engineer.  He has two children and one adopted child.   REVIEW OF SYSTEMS:  Please see HPI.  Denies any fevers, chills, or  cough.  No melena, hematochezia, hematuria, or dysuria.  Review of  systems are negative.   PHYSICAL EXAMINATION:  VITAL SIGNS:  Blood pressure is 134/84, pulse 78.  Weight 272 pounds.  GENERAL:  He is a well-developed and well-nourished male in no acute  distress.  HEENT:  Head  is normocephalic and atraumatic.  Eyes PERRLA.  EOMI.  Sclerae are anicteric.  NECK:  Without JVD.  LYMPH:  Without lymphadenopathy.  Carotids without bruits bilaterally.  CARDIOVASCULAR:  Normal S1 and S2.  Regular rate and rhythm without  murmurs.  LUNGS:  Clear to auscultation bilaterally without wheezes, rales or  rhonchi.  ABDOMEN:  Soft, nontender with normoactive bowel sounds.  No  organomegaly.  EXTREMITIES:  No edema on the right.  Trace 1+ edema on the left.  Calves are soft and nontender.  SKIN:  Warm and dry.  NEUROLOGIC:  He is alert and oriented x3.  Cranial nerves II-XII are  grossly intact.   Electrocardiogram reveals a sinus rhythm with a heart rate of 78.  Left  axis deviation, no significant change since previous tracings.   IMPRESSION:  1. Internal derangement, left knee, needs arthroscopic surgery.  2. Coronary artery disease, status post Taxus drug-eluting stent x2 to      the right coronary artery in 2005.      a.     Recent Myoview scan negative for  ischemia.  3. Good left ventricular function.  4. Treated dyslipidemia.  5. Treated hypertension.  6. Diabetes mellitus x2, insulin dependent.  7. Depression.  8. Gastroesophageal reflux disease.  9. History of nephrolithiasis.  10.History of acute renal failure in the setting of viral      gastroenteritis, resolved.   PLAN:  The patient presents to the office today for perioperative  clearance for upcoming knee surgery.  He has had no recurrent anginal  symptoms.  He has had a negative workup within the last year.  According  to [facility] and AHA guidelines, he requires no further cardiac workup prior  to his noncardiac surgery.  He should be at acceptable risk.  I  discussed the case today with Dr. Diona Browner, who agreed.  I was also in  touch with Dr. Riley Kill by telephone.  It should be okay for the patient  to hold his Plavix in the perioperative period, then to restart it after  surgery, as soon as it is felt to be safe.  He will, however, have to  remain on aspirin throughout the perioperative  period to reduce the risk of stent thrombosis.  I have explained this to  the patient today.  Our service will certainly be available in the  perioperative period as necessary.      Tereso Newcomer, PA-C  Electronically Signed      Jonelle Sidle, MD  Electronically Signed   SW/MedQ  DD: 08/25/2006  DT: 08/25/2006  Job #: [id number]   cc:   Valetta Mole. Swords, MD  Elana Alm. Thurston Hole, M.D.  Arturo Morton. Riley Kill, MD, [facility]

## 2011-02-11 NOTE — Discharge Summary (Signed)
[name], [name]               ACCOUNT NO.:  [id number]   MEDICAL RECORD NO.:  [id number]          PATIENT TYPE:  INP   LOCATION:  2011                         FACILITY:  [facility]   PHYSICIAN:  Charlton Haws, M.D.     DATE OF BIRTH:  [birth year]   DATE OF ADMISSION:  09/28/2004  DATE OF DISCHARGE:  09/29/2004                                 DISCHARGE SUMMARY   PROCEDURE:  None.   HOSPITAL COURSE:  The patient is a 60 year old male with a history of  coronary artery disease.  He had seen Dr. Riley Kill for jaw pain that was  similar to his pre-MI pain but had a Cardiolite that was without ischemia.  He came to the hospital on September 28, 2004 for chest pain.   His chest pain was atypical.  He was evaluated by Dr. Eden Emms and then by Dr.  Riley Kill.  His cardiac enzymes were negative for MI.  He had C-spine films  performed that were negative.  Dr. Riley Kill evaluated him and felt that he  could ambulated, and if he did not have chest pain with ambulation, he could  be discharged with outpatient followup arranged.   DISCHARGE DIAGNOSES:  1.  Chest pain.  2.  Status post percutaneous intervention with CYPHER stents x2 to the right      coronary artery in July, 2005 in [place].  3.  Diabetes.  4.  Hypertension.  5.  Hyperlipidemia.  6.  Obesity.  7.  History of hiatal hernia/reflux symptoms.  8.  History of osteoarthritis.  9.  Allergy or intolerance to [facility].   DISCHARGE INSTRUCTIONS:  1.  His activity level is to be as tolerate.  2.  He is to stick to a low-fat diabetic diet.  3.  He is to keep his followup appointment with Dr. Riley Kill and see his      primary care physician p.r.n.   DISCHARGE MEDICATIONS:  1.  Metformin 1000 mg b.i.d.  2.  Plavix 75 mg daily.  3.  Metoprolol 50 mg 1/2 tab b.i.d.  4.  Aspirin 81 mg daily.  5.  Nexium 40 mg daily.  6.  Vytorin 10/40 mg daily.  7.  Lexapro 10 mg daily.  8.  Fish oil.  9.  Claritin.  10. Multivitamin daily.      RB/MEDQ   D:  12/07/2004  T:  12/07/2004  Job:  [id number]   cc:   Arturo Morton. Riley Kill, M.D. [facility]   Bruce H. Swords, M.D. [facility]

## 2011-02-11 NOTE — Assessment & Plan Note (Signed)
[facility]                            CARDIOLOGY OFFICE NOTE   ANTOLIN, BELSITO                      MRN:          [id number]  DATE:10/19/2006                            DOB:          June 01, 1951    [name] is in for a followup visit.  In general he is stable, he has  not been having any ongoing chest pain or progressive shortness of  breath.  He has picked up quite a bit of weight over the past few  months.  He had knee surgery.  He is doing everything he can to try to  bring that down.  He is also being followed by Dr. Cato Mulligan closely.   MEDICATIONS:  Include:  1. Metoprolol 25 mg p.o. b.i.d.  2. Nexium 40 mg daily.  3. Vytorin 10/40.  4. Lexapro 10 daily.  5. Aspirin 81 mg daily.  6. Plavix 75 mg daily.  7. Multivitamin daily.  8. Lisinopril 20 daily.  9. Actos plus metformin 15/500 b.i.d.  10.Glyburide 5 mg daily.  11.Lantus daily.  12.Zyrtec.   PHYSICAL:  The weight is 276 pounds when compared to the previous visit  in November it is up 4 pounds.  Blood pressure is 142/88, pulse is 77.  LUNGS:  Fields are clear.  CARDIAC:  Rhythm is regular without a significant murmur.  ABDOMEN:  Is protuberant.  EKG:  Reveals sinus rhythm.  There are premature atrial contractions and  a rare PVC.   IMPRESSION:  1. Coronary artery disease, status post percutaneous stent to the      right coronary artery with drug eluting stents.  2. Residual coronary artery disease with 70% stenosis of circumflex      artery.  3. Some exertional dyspnea.  4. Hypercholesterolemia.  5. Non-insulin dependent diabetes mellitus.  6. Obesity.    1. The patient is working with a nutritionist at the present time.      Weight reduction is important.  2. His exertional dyspnea could well be related to this.  He is not      been having substernal tightness.  His radionuclear imaging study      in January 2007 was non-ischemic.  We would have a low threshold  for considering repeat catheterization should his symptoms not      improve and to that end, he and I have discussed bringing him back      in about 6 weeks to 2 months for a revisit to reassess his overall      condition.  3. Follow up of his hypertension, diabetes, and hyperlipidemia will be      with Dr. Cato Mulligan.  He may need to have blood pressure improvement,      but weight loss would substantially improve this at the present      time.     Arturo Morton. Riley Kill, MD, [facility]  Electronically Signed    TDS/MedQ  DD: 10/19/2006  DT: 10/19/2006  Job #: [id number]

## 2011-02-11 NOTE — H&P (Signed)
NAMECUONG, MOORMAN NO.:  [id number]   MEDICAL RECORD NO.:  [id number]          PATIENT TYPE:  EMS   LOCATION:  MAJO                         FACILITY:  [facility]   PHYSICIAN:  Charlton Haws, M.D.     DATE OF BIRTH:  August 10, 1951   DATE OF ADMISSION:  09/28/2004  DATE OF DISCHARGE:                                HISTORY & PHYSICAL   PRIMARY CARE PHYSICIAN:  Valetta Mole. Swords, M.D.   PRIMARY CARDIOLOGIST:  Arturo Morton. Riley Kill, M.D.   CHIEF COMPLAINT:  Chest pain.   HISTORY OF PRESENT ILLNESS:  Mr. Sturgill is a 60 year old male with a  history of coronary artery disease.  He had some left jaw pain that was  similar to his MI symptoms in July, 2005.  He saw Dr. Riley Kill and had a  Cardiolite.  On office follow-up visit on December 20th, Dr. Riley Kill says  that the Cardiolite did not demonstrate any significant perfusion defect and  was normal.  He also had no electrocardiographic changes.  He was to follow  up with Dr. Riley Kill on January 12th.   Four or five days ago, Mr. Deroos began experiencing neck pain on the left  side of his neck.  He felt that this was because he had slept wrongly.  Last  p.m., he slept on the couch and was awakened at 4:30 a.m. by general  malaise/diaphoresis, and had left shoulder pain in his shoulder blade.  It  was up to an 8/10.  He was able to get back to sleep in this a.m..  He has  continued to have pain.  It radiated down his left arm.  He had some  occasional nausea but no vomiting.  There was no diaphoresis or shortness of  breath.  He took sublingual nitroglycerin x1 at home.  He then came to the  emergency room, where he received aspirin.  He took Tylenol at home prior to  admission also.  His pain is down to a 1/10.   Mr. Skog states that these symptoms are similar to his MI symptoms, but  his MI symptoms were in his right shoulder, not in his left shoulder blade.  He also had left jaw pain, which he does not have at this  time.   PAST MEDICAL HISTORY:  1.  MI with Cypher stents x2 to the RCA in July, 2005 in [place].  2.  Diabetes.  3.  Hypertension.  4.  Hyperlipidemia.  5.  Obesity.  6.  Status post cardiac catheterization in August, 2005 with a 30% LAD, 70%      circumflex, and no restenosis in the RCA, EF 60%.  7.  History of hiatal hernia/reflux symptoms.  8.  History of osteoarthritis.   PAST SURGICAL HISTORY:  1.  Cardiac catheterizations x2.  2.  Arthroscopic knee surgery.   ALLERGIES:  FELDENE.   MEDICATIONS:  1.  Metformin 1 gm b.i.d.  2.  Metoprolol 50 mg 1/2 tab b.i.d.  3.  Plavix 75 mg q.d.  4.  Sublingual nitroglycerin p.r.n.  5.  Nexium 40 mg q.d.  6.  Aspirin 81 mg q.d.  7.  Lexapro 10 mg q.d.  8.  Vytorin 10/40 mg q.d.  9.  Multivitamin q.d.  10. Fish oil q.d.  11. Claritin 10 mg q.h.s.  (not very effective, according to the patient).   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  He does not abuse alcohol, tobacco or drugs.   FAMILY HISTORY:  His parents are both alive in their 30s.  Neither one has  heart disease.  He has a brother who is alive and has a history of CHF but  has never been cathed, to Mr. Transue's knowledge.   REVIEW OF SYSTEMS:  Significant for dyspnea on exertion, which he states is  a little worse recently.  He has occasional dizziness.  He has occasional  arthralgias in his joints and neck pain, as described above.  His reflux  symptoms are fairly well controlled on medication.  His review of systems is  otherwise negative.   PHYSICAL EXAMINATION:  VITAL SIGNS:  Blood pressure 120/76, pulse 82,  respiratory rate 24, temperature 98.4.  O2 saturation 99% on two liters.  GENERAL:  He is a well-developed and well-nourished white male in no acute  distress.  HEENT:  Head is normocephalic and atraumatic.  Pupils are equal, round and  reactive to light and accommodation.  Extraocular movements are intact.   Sclerae are clear.  Nose without discharge.  NECK:  There is no lymphadenopathy, thyromegaly, bruits, or JVD noted.  LUNGS:  Clear to auscultation bilaterally.  HEART:  Regular rate and rhythm with an S1 and S2.  No significant murmur,  rub or gallop noted.  ABDOMEN:  Soft and nontender with active bowel sounds and no  hepatosplenomegaly by exam.  EXTREMITIES:  There is no clubbing, cyanosis or edema.  Distal pulses are 2+  in all four extremities and no femoral bruits are appreciated.  MUSCULOSKELETAL:  There is no joint deformity or effusions.  There is minor  CVA tenderness.  NEUROLOGIC:  He is alert and oriented.  Cranial nerves II-XII are grossly  intact.  SKIN:  No rashes or lesions are noted.   CHEST X-RAY:  No active disease.   EKG:  Sinus rhythm.  Rate of 73.  No acute ischemic changes.  It has not  changed significantly from an EKG dated August, 2005 except for inferior T  waves in lead III and aVF are now upright.   Laboratory values are pending at the time of dictation.   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  His symptoms are atypical in nature and  not associated with acute ischemic electrocardiogram changes.  They are also  dissimilar to his previous myocardial infarction symptoms.  He will be  admitted overnight to rule out myocardial infarction.  Recent Cardiolite was  negative.  Imdur will be added to his medication regimen.  If he remains  stable overnight, he can be discharged in the a.m. and follow up with Dr.  Riley Kill as scheduled on January 12th.   Dr. Charlton Haws saw the patient and determined the plan of care.       RB/MEDQ  D:  09/28/2004  T:  09/28/2004  Job:  [id number]

## 2011-02-11 NOTE — Discharge Summary (Signed)
[name], [name]               ACCOUNT NO.:  [id number]   MEDICAL RECORD NO.:  [id number]          PATIENT TYPE:  INP   LOCATION:  1305                         FACILITY[facility]   PHYSICIAN:  Rene Paci, M.D. LHCDATE OF BIRTH:  06/01/1951   DATE OF ADMISSION:  12/25/2005  DATE OF DISCHARGE:  12/27/2005                                 DISCHARGE SUMMARY   DISCHARGE DIAGNOSES:  1.  Dehydration secondary to nausea, vomiting, diarrhea, likely infectious      gastroenteritis, resolved.  2.  Acute renal insufficiency secondary to above, resolved.  Discharge      creatinine 1.1.  3.  Type 2 diabetes.  Hemoglobin A1c was 7.8 last week.  Safe to resume home      medications including metformin and Glucotrol.  4.  Hypertension.  5.  Dyslipidemia.  6.  History of coronary disease with stent.  Continue medical management.      No signs or symptoms of angina or other cardiac issues.   DISCHARGE MEDICATIONS:  Discharge medications are as prior to admission and  include:  1.  Metformin 1 gram p.o. b.i.d.  2.  Metoprolol 25 mg b.i.d.  3.  Nexium 40 mg daily.  4.  Plavix 75 mg daily.  5.  Lisinopril 20 mg daily.  6.  Aspirin 81 mg daily.  7.  Lexapro 10 mg daily.  8.  Vytorin 10/40 p.o. daily.  9.  Glyburide 5 mg daily.   DISPOSITION:  The patient is discharged home medically stable in good  condition tolerating p.o. and all medications without problems.  Hemodynamically stable.   FOLLOW UP:  Hospital follow-up is to be arranged by the patient with his  primary care physician, Dr. Birdie Sons, in the next 2-3 weeks for routine  follow-up.   PROCEDURE THIS HOSPITALIZATION:  A renal ultrasound on December 26, 2005 with  normal size kidneys, no hydro, no abnormality.   HOSPITAL COURSE BY PROBLEM:  Nausea, vomiting, diarrhea.  The patient is a  pleasant 60 year old gentleman who presented to the emergency room at [facility] after 5 days of GI losses associated with abdominal  cramping.  Further  evaluation in the emergency room found him to be markedly dehydrated with a  creatinine of 4.4, different than his baseline creatinine of 0.7 on December 15, 2005, one week earlier.  He was admitted for IV fluid hydration and  symptomatic control for which he was given Zofran with good results.  His  nausea, vomiting, diarrhea resolved very rapidly, and he was advanced to a  clear liquid then diabetic diet without difficulty or problem.  His  creatinine improved greatly with hydration, coming from 4.4 to 2.4 to 1.1 on  the day of discharge.  He has had good urine output, and renal ultrasound  showed normal-sized kidneys without hydro or other  abnormality.  It is expected that his renal function will continue to return  to its premorbid baseline, and it is safe at this time to resume metformin  and an ACE inhibitor for his diabetes.  He _________ sliding-scale insulin  during  this hospital course, and his CBGs remained in the 130s to 150s range  with this regimen.      Rene Paci, M.D. [facility]  Electronically Signed     VL/MEDQ  D:  12/27/2005  T:  12/27/2005  Job:  [id number]

## 2011-02-11 NOTE — H&P (Signed)
NAME:  [name], [name]                         ACCOUNT NO.:  [id number]   MEDICAL RECORD NO.:  [id number]                   PATIENT TYPE:  EMS   LOCATION:  [facility]                                 FACILITY:  [facility]   PHYSICIAN:  Jonelle Sidle, M.D. [facility]        DATE OF BIRTH:  1950-11-16   DATE OF ADMISSION:  05/04/2004  DATE OF DISCHARGE:                                HISTORY & PHYSICAL   CARDIOLOGIST:  Arturo Morton. Riley Kill, M.D.   PRIMARY CARE PHYSICIAN:  Valetta Mole. Swords, M.D.   CHIEF COMPLAINT:  Chest pain.   HISTORY OF PRESENT ILLNESS:  [name] is a pleasant 60 year old gentleman  with a history of type 2 diabetes mellitus, hypertension, and recently  diagnosed coronary artery disease, status post presentation with a non-ST  elevation myocardial infarction back on April 20, 2004, while vacationing at  [place], [place].  At that time, he underwent stent placement (TAXUS)  x 2 to the right coronary artery following findings of an occluded vessel.  At that time, he was also noted to have a reported 60-70% obtuse marginal  stenosis that was managed medically.  Since then he has been doing well and  saw Dr. Riley Kill back in the office on April 29, 2004.  An exercise  Cardiolite was scheduled over the next few weeks to follow up exercise  capacity and potential residual ischemia.   This morning he states that he awoke around 2 a.m. in his recliner, having  fallen asleep watching television.  He describes a twinge in his left  upper chest which then became more pleuritic and heavy.  This persisted  throughout the night and in fact continues at this point.  He did not take  any nitroglycerin with this.  These symptoms are different from his  presentation, which he describes initially as a heaviness in the right side  of his chest radiating down his right arm.  He was nevertheless concerned  and presented to the emergency department.  His electrocardiogram at present  shows  nonspecific lateral T-wave flattening with no other acute ST changes  compared to previous tracing from April 29, 2004.  He does have a leftward  axis.   ALLERGIES:  FELDENE.  He denies any contrast allergies.   MEDICATIONS AT PRESENT:  1. Lexapro 10 mg p.o. daily.  2. Aspirin 81 mg p.o. daily.  3. Altace 5 mg p.o. daily.  4. Glucophage 1000 mg p.o. b.i.d.  5. Actos 45 mg p.o. daily.  6. Lipitor 40 mg p.o. daily.  7. Nexium 40 mg p.o. daily.  8. Plavix 75 mg p.o. daily.  9. Metoprolol 25 mg p.o. b.i.d.  10.      Sublingual nitroglycerin p.r.n.   He reports compliance with these medications.   PAST MEDICAL HISTORY:  1. Coronary artery disease as outlined above.  2. Type 2 diabetes mellitus.  3. Hypertension.  4. Dyslipidemia.  5. History of arthroscopic knee  surgery.   SOCIAL HISTORY:  The patient lives in [place], [place], with his  wife.  He is a Technical sales engineer.  He denies any tobacco and alcohol  use.   FAMILY HISTORY:  Noncontributory at present.   REVIEW OF SYSTEMS:  As described in the history of present illness.  He  states that he has had a dry cough that has been nonproductive, but has had  no fever or chills.  He has been feeling well in his usual state of health.  He has been trying to do some walking since he saw Dr. Riley Kill recently.  He  is not having any claudication symptoms.   PHYSICAL EXAMINATION:  VITAL SIGNS:  The heart rate is 78 and sinus rhythm,  blood pressure 127/66, oxygen saturation 97% on room air, and the patient is  afebrile.  GENERAL APPEARANCE:  This is a well-nourished male lying in bed in no acute  distress.  NECK:  No elevated jugular venous pressure or carotid bruits.  No  thyromegaly is noted.  LUNGS:  Clear with nonlabored breathing at rest.  CARDIAC:  Regular rate and rhythm without S3 gallop or pericardial rub.  There is no loud murmur.  ABDOMEN:  Soft with normoactive bowel sounds.  EXTREMITIES:  No pitting  edema.  Peripheral pulses are 2+.  There is a firm,  nontender site in the right groin that is likely scar tissue.   INITIAL LABORATORY DATA:  Glucose 169, BUN 16, creatinine 0.7, sodium 137,  potassium 4.0.  The hemoglobin is 13.6.   The chest x-ray is pending.   IMPRESSION:  1. Chest pain syndrome at rest.  Concerning for potential unstable angina in     a 60 year old gentleman with recently diagnosed coronary artery disease,     status post non-ST elevation myocardial infarction in late July.  Treated     with TAXUS stent placement to the right coronary artery.  He does have     residual 60-70% obtuse marginal disease as described.  His     electrocardiogram is nonspecific at this point.  He continues to have     symptoms.  2. Type 2 diabetes mellitus managed medically and by diet.  3. Hypertension.  4. Dyslipidemia on statin therapy.   PLAN:  1. After discussing the risks and benefits with the patient, will proceed     with diagnostic coronary angiography to assess both stent patency, as     well as residual disease in the circumflex.  He is in agreement to     proceed and we will plan to move head today.  2. Will continue home medications and transport directly to the cardiac     catheterization laboratory.  Additional anticoagulants can be considered     at that time.  3. Further plans to follow.                                                Jonelle Sidle, M.D. [facility]    SGM/MEDQ  D:  05/04/2004  T:  05/04/2004  Job:  [id number]

## 2011-02-14 ENCOUNTER — Encounter (HOSPITAL_COMMUNITY): Payer: Self-pay

## 2011-02-16 ENCOUNTER — Encounter (HOSPITAL_COMMUNITY): Payer: Self-pay

## 2011-02-18 ENCOUNTER — Encounter (HOSPITAL_COMMUNITY): Payer: Self-pay

## 2011-02-21 ENCOUNTER — Encounter (HOSPITAL_COMMUNITY): Payer: Self-pay

## 2011-02-22 ENCOUNTER — Other Ambulatory Visit: Payer: Self-pay | Admitting: *Deleted

## 2011-02-22 DIAGNOSIS — F329 Major depressive disorder, single episode, unspecified: Secondary | ICD-10-CM

## 2011-02-22 DIAGNOSIS — F32A Depression, unspecified: Secondary | ICD-10-CM

## 2011-02-22 MED ORDER — CITALOPRAM HYDROBROMIDE 20 MG PO TABS: 20.0000 mg | ORAL_TABLET | Freq: Every day | ORAL | Status: DC

## 2011-02-23 ENCOUNTER — Encounter (HOSPITAL_COMMUNITY): Payer: Self-pay

## 2011-02-25 ENCOUNTER — Encounter (HOSPITAL_COMMUNITY): Payer: Self-pay

## 2011-02-28 ENCOUNTER — Encounter (HOSPITAL_COMMUNITY): Payer: Self-pay

## 2011-03-02 ENCOUNTER — Encounter (HOSPITAL_COMMUNITY): Payer: Self-pay

## 2011-03-04 ENCOUNTER — Encounter (HOSPITAL_COMMUNITY): Payer: Self-pay

## 2011-03-07 ENCOUNTER — Encounter (HOSPITAL_COMMUNITY): Payer: Self-pay

## 2011-03-09 ENCOUNTER — Encounter (HOSPITAL_COMMUNITY): Payer: Self-pay

## 2011-03-11 ENCOUNTER — Ambulatory Visit (INDEPENDENT_AMBULATORY_CARE_PROVIDER_SITE_OTHER): Payer: PRIVATE HEALTH INSURANCE | Admitting: Internal Medicine

## 2011-03-11 ENCOUNTER — Encounter (HOSPITAL_COMMUNITY): Payer: Self-pay

## 2011-03-11 DIAGNOSIS — I2699 Other pulmonary embolism without acute cor pulmonale: Secondary | ICD-10-CM

## 2011-03-11 LAB — POCT INR: INR: 4.4

## 2011-03-11 NOTE — Patient Instructions (Signed)
Same dose check in 2 weeks

## 2011-03-14 ENCOUNTER — Encounter (HOSPITAL_COMMUNITY): Payer: Self-pay

## 2011-03-16 ENCOUNTER — Encounter (HOSPITAL_COMMUNITY): Payer: Self-pay

## 2011-03-18 ENCOUNTER — Encounter (HOSPITAL_COMMUNITY): Payer: Self-pay

## 2011-03-21 ENCOUNTER — Encounter (HOSPITAL_COMMUNITY): Payer: Self-pay

## 2011-03-23 ENCOUNTER — Encounter (HOSPITAL_COMMUNITY): Payer: Self-pay

## 2011-03-24 ENCOUNTER — Other Ambulatory Visit: Payer: Self-pay | Admitting: *Deleted

## 2011-03-24 MED ORDER — METOPROLOL SUCCINATE ER 25 MG PO TB24: 25.0000 mg | ORAL_TABLET | Freq: Every day | ORAL | Status: DC

## 2011-03-25 ENCOUNTER — Encounter (HOSPITAL_COMMUNITY): Payer: Self-pay

## 2011-03-25 ENCOUNTER — Ambulatory Visit (INDEPENDENT_AMBULATORY_CARE_PROVIDER_SITE_OTHER): Payer: PRIVATE HEALTH INSURANCE | Admitting: Internal Medicine

## 2011-03-25 DIAGNOSIS — I2699 Other pulmonary embolism without acute cor pulmonale: Secondary | ICD-10-CM

## 2011-03-25 NOTE — Patient Instructions (Signed)
Same dose 

## 2011-03-28 ENCOUNTER — Encounter (HOSPITAL_COMMUNITY): Payer: Self-pay

## 2011-03-30 ENCOUNTER — Encounter (HOSPITAL_COMMUNITY): Payer: Self-pay

## 2011-04-01 ENCOUNTER — Encounter (HOSPITAL_COMMUNITY): Payer: Self-pay

## 2011-04-04 ENCOUNTER — Other Ambulatory Visit: Payer: Self-pay | Admitting: Internal Medicine

## 2011-04-04 ENCOUNTER — Other Ambulatory Visit (INDEPENDENT_AMBULATORY_CARE_PROVIDER_SITE_OTHER): Payer: PRIVATE HEALTH INSURANCE

## 2011-04-04 ENCOUNTER — Encounter (HOSPITAL_COMMUNITY): Payer: Self-pay

## 2011-04-04 DIAGNOSIS — I2699 Other pulmonary embolism without acute cor pulmonale: Secondary | ICD-10-CM

## 2011-04-04 LAB — HEPATIC FUNCTION PANEL
ALT: 24 U/L (ref 0–53)
AST: 18 U/L (ref 0–37)
Bilirubin, Direct: 0 mg/dL (ref 0.0–0.3)
Total Bilirubin: 0.5 mg/dL (ref 0.3–1.2)
Total Protein: 7.5 g/dL (ref 6.0–8.3)

## 2011-04-04 LAB — BASIC METABOLIC PANEL
Calcium: 10.2 mg/dL (ref 8.4–10.5)
GFR: 77.99 mL/min (ref >60.00)
Glucose, Bld: 212 mg/dL — ABNORMAL HIGH (ref 70–99)
Potassium: 4.3 mEq/L (ref 3.5–5.1)
Sodium: 141 mEq/L (ref 135–145)

## 2011-04-04 LAB — LDL CHOLESTEROL, DIRECT: Direct LDL: 96.9 mg/dL

## 2011-04-04 LAB — LIPID PANEL: Triglycerides: 486 mg/dL — ABNORMAL HIGH (ref 0.0–149.0)

## 2011-04-04 LAB — HEMOGLOBIN A1C: Hgb A1c MFr Bld: 8.5 % — ABNORMAL HIGH (ref 4.6–6.5)

## 2011-04-06 ENCOUNTER — Other Ambulatory Visit: Payer: Self-pay | Admitting: Internal Medicine

## 2011-04-06 ENCOUNTER — Encounter (HOSPITAL_COMMUNITY): Payer: Self-pay

## 2011-04-08 ENCOUNTER — Encounter (HOSPITAL_COMMUNITY): Payer: Self-pay

## 2011-04-11 ENCOUNTER — Encounter (HOSPITAL_COMMUNITY): Payer: Self-pay

## 2011-04-11 ENCOUNTER — Ambulatory Visit: Payer: PRIVATE HEALTH INSURANCE | Admitting: Internal Medicine

## 2011-04-11 ENCOUNTER — Telehealth: Payer: Self-pay | Admitting: Cardiology

## 2011-04-11 MED ORDER — METOPROLOL SUCCINATE ER 25 MG PO TB24: 25.0000 mg | ORAL_TABLET | Freq: Every day | ORAL | Status: DC

## 2011-04-11 NOTE — Telephone Encounter (Signed)
Will send a new prescription for Toprol 25 mg daily.

## 2011-04-11 NOTE — Telephone Encounter (Signed)
Lmtcb* I did clarify Metoprolol dose.

## 2011-04-11 NOTE — Telephone Encounter (Signed)
Pt rtn call, pls call [phone]

## 2011-04-11 NOTE — Telephone Encounter (Signed)
Pt has always been on metoprolol. Please clarify direction/ dosage of meds.

## 2011-04-13 ENCOUNTER — Encounter (HOSPITAL_COMMUNITY): Payer: Self-pay

## 2011-04-15 ENCOUNTER — Encounter (HOSPITAL_COMMUNITY): Payer: Self-pay

## 2011-04-18 ENCOUNTER — Encounter (HOSPITAL_COMMUNITY): Payer: Self-pay

## 2011-04-18 ENCOUNTER — Ambulatory Visit: Payer: PRIVATE HEALTH INSURANCE | Admitting: Internal Medicine

## 2011-04-18 ENCOUNTER — Ambulatory Visit: Payer: PRIVATE HEALTH INSURANCE | Admitting: Family Medicine

## 2011-04-19 ENCOUNTER — Ambulatory Visit (INDEPENDENT_AMBULATORY_CARE_PROVIDER_SITE_OTHER): Payer: PRIVATE HEALTH INSURANCE

## 2011-04-19 DIAGNOSIS — I2699 Other pulmonary embolism without acute cor pulmonale: Secondary | ICD-10-CM

## 2011-04-19 NOTE — Patient Instructions (Signed)
Take 7.5mg . Today only then resume 5mg . Every day Check in 4 weeks

## 2011-04-20 ENCOUNTER — Other Ambulatory Visit: Payer: Self-pay | Admitting: Internal Medicine

## 2011-04-20 ENCOUNTER — Encounter (HOSPITAL_COMMUNITY): Payer: Self-pay

## 2011-04-22 ENCOUNTER — Encounter (HOSPITAL_COMMUNITY): Payer: Self-pay

## 2011-04-25 ENCOUNTER — Encounter (HOSPITAL_COMMUNITY): Payer: Self-pay

## 2011-04-27 ENCOUNTER — Encounter (HOSPITAL_COMMUNITY): Payer: Self-pay

## 2011-04-29 ENCOUNTER — Encounter (HOSPITAL_COMMUNITY): Payer: Self-pay

## 2011-05-02 ENCOUNTER — Encounter (HOSPITAL_COMMUNITY): Payer: Self-pay

## 2011-05-04 ENCOUNTER — Encounter (HOSPITAL_COMMUNITY): Payer: Self-pay

## 2011-05-06 ENCOUNTER — Encounter (HOSPITAL_COMMUNITY): Payer: Self-pay

## 2011-05-09 ENCOUNTER — Encounter (HOSPITAL_COMMUNITY): Payer: Self-pay

## 2011-05-10 ENCOUNTER — Encounter: Payer: PRIVATE HEALTH INSURANCE | Attending: Internal Medicine | Admitting: Dietician

## 2011-05-10 ENCOUNTER — Encounter: Payer: Self-pay | Admitting: Dietician

## 2011-05-10 ENCOUNTER — Other Ambulatory Visit: Payer: Self-pay | Admitting: Internal Medicine

## 2011-05-10 DIAGNOSIS — E119 Type 2 diabetes mellitus without complications: Secondary | ICD-10-CM | POA: Insufficient documentation

## 2011-05-10 DIAGNOSIS — Z713 Dietary counseling and surveillance: Secondary | ICD-10-CM | POA: Insufficient documentation

## 2011-05-11 ENCOUNTER — Encounter (HOSPITAL_COMMUNITY): Payer: Self-pay

## 2011-05-12 ENCOUNTER — Ambulatory Visit: Payer: PRIVATE HEALTH INSURANCE | Admitting: Dietician

## 2011-05-13 ENCOUNTER — Encounter (HOSPITAL_COMMUNITY): Payer: Self-pay

## 2011-05-15 NOTE — Progress Notes (Signed)
  Medical Nutrition Therapy:  Appt start time: 1500 end time:  1600.   Assessment:  Primary concerns today: Uncontrolled blood glucose with weight gain.  Wants to loose weight.  Appears to know how to eat and to exercise, but fails to do so.  At times appears motivated to participate in his care.  His wife verbalizes frustration with him.  HgA1C in June was 8.2%.  On Levemir 60 units AM and PM,  Fasting levels sometimes as high as 300 mg.  AC lunch is at 120-150 and AC dinner at 120-150 mg.  His Novolin is 30-35 units before meals.  He is not exercising.  Diet recall is notable for higher fat content at times along with increased portions.   MEDICATIONS: Continue current medication.  Needs to note the amount of the Novolin that he is really taking at meals. Recommend using a glucose log book with a record of glucose levels and medications that he is taking for his glucose levels    DIETARY INTAKE:  Usual eating pattern includes 3 meals and 1 snacks per day.  24-hr recall:  B (7-8:00 AM): Scrambled eggs with 3-4 strips of bacon and decaf coffee  Snk ( AM): None  L (11:00 AM):  Chicken sandwich with a coke Zero or a hamburger with lettuce, tomato, onion, an occ. Serving of fries Snk ( PM): None D (6:00 PM): Spaghetti, (noodles and meat sauce= 11/2 -2 cups, salad  No bread. Snk ( PM): Sometimes will have a snack.  Last evening had a lime float with lime ice cream.  Usual physical activity: Currently, not regularly exercising.  Estimated energy needs: 1800-1900 200-205 g carbohydrates 135-140 g protein 50 g fat  Progress Towards Goal(s):  In progress.   Nutritional Diagnosis:  [place]-2.1 Inpaired nutrition utilization As related to glucose.  As evidenced by diagnosis of DM 2 and HgA1C of 8.2 %.    Intervention:  Nutrition Nutrient review reveals need for closer attention to carbohydrate and fat intake.  Monitoring/Evaluation:  Dietary intake, exercise, blood glucose and medications, and  body weight 8-12 weeks.

## 2011-05-15 NOTE — Patient Instructions (Signed)
   Look to explore getting into a water exercise program or water aerobics program.  Just walking in the water for 30-45 minutes for 3 time per week would be good for the glucose levels.  Look to use a leaner meat at breakfast, consider the use of Canadian bacon rather than the regular bacon..  Continue to use the [employer] for a margarine.  Consider using the orange rather than the juice.  The juice goes to glucose faster.  Other foods with potassium would include the cantaloupe, or  Tomato at breakfast.  Try using the calorieking.com web site for the carbohydrate counts of foods that you do not have a food label for.    When going to a chain restaurant, try going to their web site for nutrition counts.  These sites can also be accessed by an App site on a phone these days.  Begin to measure portions of starchy items like the spaghetti and make sure that you keep the portion to 1 cup and add a non-starch vegetable to the meal.  The non-starchy will add volume without adding a great deal more carbohydrates and calories.  Use snack list and the menu suggestions for keeping the carb intake at snacks to no more than 30 gms for snacks at night and 60 gms for carbohydrates for meals.

## 2011-05-16 ENCOUNTER — Encounter (HOSPITAL_COMMUNITY): Payer: Self-pay

## 2011-05-18 ENCOUNTER — Encounter (HOSPITAL_COMMUNITY): Payer: Self-pay

## 2011-05-18 ENCOUNTER — Telehealth: Payer: Self-pay | Admitting: Internal Medicine

## 2011-05-18 NOTE — Telephone Encounter (Signed)
3 months---come in fasting

## 2011-05-18 NOTE — Telephone Encounter (Signed)
Left message to notify pt  

## 2011-05-18 NOTE — Telephone Encounter (Signed)
Please advise 

## 2011-05-18 NOTE — Telephone Encounter (Signed)
Pt called and wants to know when he needs to come in for next ov with labs prior?

## 2011-05-20 ENCOUNTER — Ambulatory Visit: Payer: PRIVATE HEALTH INSURANCE

## 2011-05-20 ENCOUNTER — Encounter (HOSPITAL_COMMUNITY): Payer: Self-pay

## 2011-05-23 ENCOUNTER — Encounter (HOSPITAL_COMMUNITY): Payer: Self-pay

## 2011-05-23 ENCOUNTER — Ambulatory Visit (INDEPENDENT_AMBULATORY_CARE_PROVIDER_SITE_OTHER): Payer: PRIVATE HEALTH INSURANCE | Admitting: Internal Medicine

## 2011-05-23 DIAGNOSIS — I2699 Other pulmonary embolism without acute cor pulmonale: Secondary | ICD-10-CM

## 2011-05-23 NOTE — Patient Instructions (Signed)
Monday and Thursday 7.5mg . All other days 5mg . Check in 2 weeks

## 2011-05-25 ENCOUNTER — Encounter (HOSPITAL_COMMUNITY): Payer: Self-pay

## 2011-05-27 ENCOUNTER — Encounter (HOSPITAL_COMMUNITY): Payer: Self-pay

## 2011-05-30 ENCOUNTER — Encounter (HOSPITAL_COMMUNITY): Payer: Self-pay

## 2011-06-01 ENCOUNTER — Encounter (HOSPITAL_COMMUNITY): Payer: Self-pay

## 2011-06-03 ENCOUNTER — Encounter (HOSPITAL_COMMUNITY): Payer: Self-pay

## 2011-06-06 ENCOUNTER — Encounter (HOSPITAL_COMMUNITY): Payer: Self-pay

## 2011-06-07 ENCOUNTER — Ambulatory Visit (INDEPENDENT_AMBULATORY_CARE_PROVIDER_SITE_OTHER): Payer: PRIVATE HEALTH INSURANCE

## 2011-06-07 DIAGNOSIS — I2699 Other pulmonary embolism without acute cor pulmonale: Secondary | ICD-10-CM

## 2011-06-07 NOTE — Patient Instructions (Signed)
Same dose, Monday and Thursday 7.5mg . All other days 5mg .

## 2011-06-08 ENCOUNTER — Encounter (HOSPITAL_COMMUNITY): Payer: Self-pay

## 2011-06-10 ENCOUNTER — Encounter (HOSPITAL_COMMUNITY): Payer: Self-pay

## 2011-06-13 ENCOUNTER — Encounter (HOSPITAL_COMMUNITY): Payer: Self-pay

## 2011-06-15 ENCOUNTER — Encounter (HOSPITAL_COMMUNITY): Payer: Self-pay

## 2011-06-17 ENCOUNTER — Encounter (HOSPITAL_COMMUNITY): Payer: Self-pay

## 2011-06-20 ENCOUNTER — Encounter (HOSPITAL_COMMUNITY): Payer: Self-pay

## 2011-06-22 ENCOUNTER — Encounter (HOSPITAL_COMMUNITY): Payer: Self-pay

## 2011-06-24 ENCOUNTER — Encounter (HOSPITAL_COMMUNITY): Payer: Self-pay

## 2011-06-27 ENCOUNTER — Encounter (HOSPITAL_COMMUNITY): Payer: Self-pay

## 2011-06-27 LAB — COMPREHENSIVE METABOLIC PANEL
AST: 18
Albumin: 3.6
BUN: 25 — ABNORMAL HIGH
Calcium: 9.2
Creatinine, Ser: 1.1
GFR calc Af Amer: >60
Total Protein: 6.3

## 2011-06-27 LAB — DIFFERENTIAL
Basophils Relative: 1
Lymphs Abs: 0.8
Monocytes Absolute: 0.1
Monocytes Relative: 1 — ABNORMAL LOW
Neutro Abs: 4.3
Neutrophils Relative %: 81 — ABNORMAL HIGH

## 2011-06-27 LAB — BASIC METABOLIC PANEL
BUN: 19
BUN: 26 — ABNORMAL HIGH
Calcium: 8.8
Chloride: 106
Chloride: 107
Chloride: 108
Creatinine, Ser: 0.99
GFR calc Af Amer: >60
GFR calc Af Amer: >60
GFR calc non Af Amer: 59 — ABNORMAL LOW
GFR calc non Af Amer: >60
Potassium: 4.5
Potassium: 3.9
Sodium: 138
Sodium: 138

## 2011-06-27 LAB — POCT I-STAT, CHEM 8
Calcium, Ion: 1.18
Creatinine, Ser: 1.3
Glucose, Bld: 342 — ABNORMAL HIGH
HCT: 39
HCT: 36 — ABNORMAL LOW
Hemoglobin: 13.3
Sodium: 135
TCO2: 26
TCO2: 23

## 2011-06-27 LAB — GLUCOSE, CAPILLARY
Glucose-Capillary: 165 — ABNORMAL HIGH
Glucose-Capillary: 187 — ABNORMAL HIGH
Glucose-Capillary: 189 — ABNORMAL HIGH
Glucose-Capillary: 192 — ABNORMAL HIGH
Glucose-Capillary: 193 — ABNORMAL HIGH
Glucose-Capillary: 225 — ABNORMAL HIGH
Glucose-Capillary: 239 — ABNORMAL HIGH
Glucose-Capillary: 181 — ABNORMAL HIGH
Glucose-Capillary: 192 — ABNORMAL HIGH
Glucose-Capillary: 296 — ABNORMAL HIGH

## 2011-06-27 LAB — LIPID PANEL
Cholesterol: 115
HDL: 21 — ABNORMAL LOW
LDL Cholesterol: 35
Total CHOL/HDL Ratio: 5.5

## 2011-06-27 LAB — TROPONIN I: Troponin I: 0.01

## 2011-06-27 LAB — URINALYSIS, ROUTINE W REFLEX MICROSCOPIC
Bilirubin Urine: NEGATIVE
Glucose, UA: 100 — AB
Specific Gravity, Urine: 1.03
pH: 5.5

## 2011-06-27 LAB — CBC
HCT: 33.7 — ABNORMAL LOW
Hemoglobin: 10.5 — ABNORMAL LOW
Hemoglobin: 11.3 — ABNORMAL LOW
Hemoglobin: 11.9 — ABNORMAL LOW
MCHC: 33.2
MCHC: 34.1
MCV: 82.8
MCV: 84
RBC: 3.73 — ABNORMAL LOW
RBC: 4.03 — ABNORMAL LOW
RBC: 4.26
RDW: 14.2
WBC: 4.3
WBC: 5.3

## 2011-06-27 LAB — CK TOTAL AND CKMB (NOT AT ARMC)
CK, MB: 1.7
Total CK: 64

## 2011-06-27 LAB — URINE MICROSCOPIC-ADD ON

## 2011-06-27 LAB — HEMOGLOBIN A1C: Mean Plasma Glucose: 183

## 2011-06-27 LAB — CARDIAC PANEL(CRET KIN+CKTOT+MB+TROPI)
CK, MB: 1.1
Relative Index: INVALID
Troponin I: 0.01
Troponin I: 0.02

## 2011-06-27 LAB — MAGNESIUM: Magnesium: 1.6

## 2011-06-27 LAB — POCT CARDIAC MARKERS
CKMB, poc: 1
Myoglobin, poc: 110
Myoglobin, poc: 143

## 2011-06-27 LAB — APTT: aPTT: 37

## 2011-06-27 LAB — HEPARIN LEVEL (UNFRACTIONATED): Heparin Unfractionated: 0.35

## 2011-06-27 LAB — PROTIME-INR: INR: 1

## 2011-06-28 LAB — GLUCOSE, CAPILLARY
Glucose-Capillary: 110 — ABNORMAL HIGH
Glucose-Capillary: 114 — ABNORMAL HIGH
Glucose-Capillary: 186 — ABNORMAL HIGH
Glucose-Capillary: 86
Glucose-Capillary: 94

## 2011-06-29 ENCOUNTER — Encounter (HOSPITAL_COMMUNITY): Payer: Self-pay

## 2011-07-01 ENCOUNTER — Encounter (HOSPITAL_COMMUNITY): Payer: Self-pay

## 2011-07-04 ENCOUNTER — Encounter (HOSPITAL_COMMUNITY): Payer: Self-pay

## 2011-07-05 ENCOUNTER — Ambulatory Visit: Payer: PRIVATE HEALTH INSURANCE

## 2011-07-06 ENCOUNTER — Encounter (HOSPITAL_COMMUNITY): Payer: Self-pay

## 2011-07-07 LAB — COMPREHENSIVE METABOLIC PANEL
ALT: 21
AST: 47 — ABNORMAL HIGH
Albumin: 3.3 — ABNORMAL LOW
Alkaline Phosphatase: 108
CO2: 26
Calcium: 9.1
Creatinine, Ser: 0.65
GFR calc Af Amer: >60
Glucose, Bld: 189 — ABNORMAL HIGH
Total Bilirubin: 1.6 — ABNORMAL HIGH
Total Protein: 6.2

## 2011-07-07 LAB — I-STAT 8, (EC8 V) (CONVERTED LAB)
BUN: 14
Chloride: 105
Glucose, Bld: 202 — ABNORMAL HIGH
Hemoglobin: 12.9 — ABNORMAL LOW
Potassium: 5.2 — ABNORMAL HIGH
Sodium: 137
TCO2: 29

## 2011-07-07 LAB — DIFFERENTIAL
Basophils Absolute: 0
Basophils Relative: 1
Eosinophils Absolute: 0.2
Eosinophils Relative: 4
Monocytes Absolute: 0.1 — ABNORMAL LOW
Monocytes Relative: 3

## 2011-07-07 LAB — CBC
MCHC: 34.4
MCV: 83.3
Platelets: 258
WBC: 5

## 2011-07-07 LAB — APTT: aPTT: 37

## 2011-07-07 LAB — TSH: TSH: 1.861

## 2011-07-07 LAB — PROTIME-INR: INR: 0.9

## 2011-07-07 LAB — POCT CARDIAC MARKERS: CKMB, poc: 1.6

## 2011-07-07 LAB — CK TOTAL AND CKMB (NOT AT ARMC): Total CK: 153

## 2011-07-07 LAB — CARDIAC PANEL(CRET KIN+CKTOT+MB+TROPI)
CK, MB: 1.9
Troponin I: 0.01

## 2011-07-08 ENCOUNTER — Encounter (HOSPITAL_COMMUNITY): Payer: Self-pay

## 2011-07-11 ENCOUNTER — Ambulatory Visit (INDEPENDENT_AMBULATORY_CARE_PROVIDER_SITE_OTHER): Payer: PRIVATE HEALTH INSURANCE

## 2011-07-11 ENCOUNTER — Encounter (HOSPITAL_COMMUNITY): Payer: Self-pay

## 2011-07-11 DIAGNOSIS — I2699 Other pulmonary embolism without acute cor pulmonale: Secondary | ICD-10-CM

## 2011-07-11 LAB — POCT INR: INR: 2.6

## 2011-07-11 NOTE — Patient Instructions (Signed)
  Latest dosing instructions   Total Sun Mon Tue Wed Thu Fri Sat   40 5 mg 7.5 mg 5 mg 5 mg 7.5 mg 5 mg 5 mg    (5 mg1) (5 mg1.5) (5 mg1) (5 mg1) (5 mg1.5) (5 mg1) (5 mg1)        

## 2011-07-13 ENCOUNTER — Encounter (HOSPITAL_COMMUNITY): Payer: Self-pay

## 2011-07-15 ENCOUNTER — Encounter (HOSPITAL_COMMUNITY): Payer: Self-pay

## 2011-07-18 ENCOUNTER — Encounter (HOSPITAL_COMMUNITY): Payer: Self-pay

## 2011-07-20 ENCOUNTER — Encounter (HOSPITAL_COMMUNITY): Payer: Self-pay

## 2011-07-22 ENCOUNTER — Encounter (HOSPITAL_COMMUNITY): Payer: Self-pay

## 2011-07-25 ENCOUNTER — Encounter (HOSPITAL_COMMUNITY): Payer: Self-pay

## 2011-07-25 ENCOUNTER — Other Ambulatory Visit: Payer: Self-pay | Admitting: Internal Medicine

## 2011-07-25 ENCOUNTER — Emergency Department (HOSPITAL_COMMUNITY)
Admission: EM | Admit: 2011-07-25 | Discharge: 2011-07-26 | Disposition: A | Discharge status: Home / Self Care | Payer: PRIVATE HEALTH INSURANCE | Attending: Emergency Medicine | Admitting: Emergency Medicine

## 2011-07-25 ENCOUNTER — Emergency Department (HOSPITAL_COMMUNITY): Payer: PRIVATE HEALTH INSURANCE

## 2011-07-25 DIAGNOSIS — I2510 Atherosclerotic heart disease of native coronary artery without angina pectoris: Secondary | ICD-10-CM | POA: Insufficient documentation

## 2011-07-25 DIAGNOSIS — R079 Chest pain, unspecified: Secondary | ICD-10-CM | POA: Insufficient documentation

## 2011-07-25 DIAGNOSIS — E119 Type 2 diabetes mellitus without complications: Secondary | ICD-10-CM | POA: Insufficient documentation

## 2011-07-25 DIAGNOSIS — Z794 Long term (current) use of insulin: Secondary | ICD-10-CM | POA: Insufficient documentation

## 2011-07-25 DIAGNOSIS — I10 Essential (primary) hypertension: Secondary | ICD-10-CM | POA: Insufficient documentation

## 2011-07-25 DIAGNOSIS — R0989 Other specified symptoms and signs involving the circulatory and respiratory systems: Secondary | ICD-10-CM | POA: Insufficient documentation

## 2011-07-25 DIAGNOSIS — R0609 Other forms of dyspnea: Secondary | ICD-10-CM | POA: Insufficient documentation

## 2011-07-25 DIAGNOSIS — Z86718 Personal history of other venous thrombosis and embolism: Secondary | ICD-10-CM | POA: Insufficient documentation

## 2011-07-25 DIAGNOSIS — Z79899 Other long term (current) drug therapy: Secondary | ICD-10-CM | POA: Insufficient documentation

## 2011-07-25 DIAGNOSIS — E7800 Pure hypercholesterolemia, unspecified: Secondary | ICD-10-CM | POA: Insufficient documentation

## 2011-07-25 DIAGNOSIS — Z7901 Long term (current) use of anticoagulants: Secondary | ICD-10-CM | POA: Insufficient documentation

## 2011-07-25 DIAGNOSIS — R0602 Shortness of breath: Secondary | ICD-10-CM | POA: Insufficient documentation

## 2011-07-25 LAB — CBC
HCT: 38 % — ABNORMAL LOW (ref 39.0–52.0)
Hemoglobin: 13 g/dL (ref 13.0–17.0)
RDW: 14.6 % (ref 11.5–15.5)
WBC: 4.7 10*3/uL (ref 4.0–10.5)

## 2011-07-25 LAB — CK TOTAL AND CKMB (NOT AT ARMC)
Relative Index: 2.4 (ref 0.0–2.5)
Total CK: 128 U/L (ref 7–232)

## 2011-07-25 LAB — TROPONIN I: Troponin I: <0.3 ng/mL (ref <0.30)

## 2011-07-25 LAB — COMPREHENSIVE METABOLIC PANEL
AST: 15 U/L (ref 0–37)
CO2: 26 mEq/L (ref 19–32)
Calcium: 10.6 mg/dL — ABNORMAL HIGH (ref 8.4–10.5)
Creatinine, Ser: 1 mg/dL (ref 0.50–1.35)
GFR calc non Af Amer: 81 mL/min — ABNORMAL LOW (ref >90)

## 2011-07-25 LAB — PROTIME-INR: INR: 2.12 — ABNORMAL HIGH (ref 0.00–1.49)

## 2011-07-25 LAB — DIFFERENTIAL
Basophils Absolute: 0.1 10*3/uL (ref 0.0–0.1)
Lymphocytes Relative: 21 % (ref 12–46)
Neutro Abs: 3.4 10*3/uL (ref 1.7–7.7)

## 2011-07-25 MED ORDER — IOHEXOL 350 MG/ML SOLN
100.0000 mL | Freq: Once | INTRAVENOUS | Status: AC | PRN
  Administered 2011-07-25: 100 mL via INTRAVENOUS

## 2011-07-27 ENCOUNTER — Encounter (HOSPITAL_COMMUNITY): Payer: Self-pay

## 2011-07-29 ENCOUNTER — Encounter (HOSPITAL_COMMUNITY): Payer: Self-pay

## 2011-08-01 ENCOUNTER — Encounter (HOSPITAL_COMMUNITY): Payer: Self-pay

## 2011-08-03 ENCOUNTER — Encounter (HOSPITAL_COMMUNITY): Payer: Self-pay

## 2011-08-04 ENCOUNTER — Ambulatory Visit: Payer: PRIVATE HEALTH INSURANCE | Admitting: Physician Assistant

## 2011-08-04 ENCOUNTER — Other Ambulatory Visit (INDEPENDENT_AMBULATORY_CARE_PROVIDER_SITE_OTHER): Payer: PRIVATE HEALTH INSURANCE

## 2011-08-04 DIAGNOSIS — Z0000 Encounter for general adult medical examination without abnormal findings: Secondary | ICD-10-CM

## 2011-08-04 LAB — CBC WITH DIFFERENTIAL/PLATELET
Basophils Relative: 1 % (ref 0.0–3.0)
Eosinophils Relative: 4.3 % (ref 0.0–5.0)
HCT: 38.9 % — ABNORMAL LOW (ref 39.0–52.0)
Hemoglobin: 13 g/dL (ref 13.0–17.0)
Lymphs Abs: 0.7 10*3/uL (ref 0.7–4.0)
MCV: 83.7 fl (ref 78.0–100.0)
Monocytes Absolute: 0 10*3/uL — ABNORMAL LOW (ref 0.1–1.0)
Neutro Abs: 2.8 10*3/uL (ref 1.4–7.7)
Platelets: 199 10*3/uL (ref 150.0–400.0)
RBC: 4.65 Mil/uL (ref 4.22–5.81)
WBC: 3.8 10*3/uL — ABNORMAL LOW (ref 4.5–10.5)

## 2011-08-04 LAB — BASIC METABOLIC PANEL
BUN: 24 mg/dL — ABNORMAL HIGH (ref 6–23)
Chloride: 104 mEq/L (ref 96–112)
Potassium: 4.2 mEq/L (ref 3.5–5.1)
Sodium: 139 mEq/L (ref 135–145)

## 2011-08-04 LAB — HEPATIC FUNCTION PANEL
ALT: 26 U/L (ref 0–53)
AST: 21 U/L (ref 0–37)
Albumin: 3.9 g/dL (ref 3.5–5.2)
Total Protein: 6.9 g/dL (ref 6.0–8.3)

## 2011-08-04 LAB — POCT URINALYSIS DIPSTICK
Protein, UA: NEGATIVE
Spec Grav, UA: 1.025
Urobilinogen, UA: 0.2
pH, UA: 5

## 2011-08-04 LAB — TSH: TSH: 1.48 u[IU]/mL (ref 0.35–5.50)

## 2011-08-04 LAB — LIPID PANEL: Cholesterol: 205 mg/dL — ABNORMAL HIGH (ref 0–200)

## 2011-08-04 LAB — MICROALBUMIN / CREATININE URINE RATIO
Creatinine,U: 159.3 mg/dL
Microalb, Ur: 3.9 mg/dL — ABNORMAL HIGH (ref 0.0–1.9)

## 2011-08-04 LAB — LDL CHOLESTEROL, DIRECT: Direct LDL: 132 mg/dL

## 2011-08-04 LAB — HEMOGLOBIN A1C: Hgb A1c MFr Bld: 8.4 % — ABNORMAL HIGH (ref 4.6–6.5)

## 2011-08-05 ENCOUNTER — Encounter (HOSPITAL_COMMUNITY): Payer: Self-pay

## 2011-08-08 ENCOUNTER — Encounter (HOSPITAL_COMMUNITY): Payer: Self-pay

## 2011-08-10 ENCOUNTER — Encounter (HOSPITAL_COMMUNITY): Payer: Self-pay

## 2011-08-11 ENCOUNTER — Ambulatory Visit: Payer: PRIVATE HEALTH INSURANCE | Admitting: Internal Medicine

## 2011-08-12 ENCOUNTER — Telehealth: Payer: Self-pay | Admitting: Oncology

## 2011-08-12 ENCOUNTER — Encounter (HOSPITAL_COMMUNITY): Payer: Self-pay

## 2011-08-12 NOTE — Telephone Encounter (Signed)
Called pt, reminded pt of appt for lab on 11/126th, asked pt to call us to r/s appt cancelled due to [facility]

## 2011-08-15 ENCOUNTER — Encounter (HOSPITAL_COMMUNITY): Payer: Self-pay

## 2011-08-16 ENCOUNTER — Other Ambulatory Visit: Payer: Self-pay | Admitting: Cardiology

## 2011-08-17 ENCOUNTER — Encounter: Payer: Self-pay | Admitting: Cardiology

## 2011-08-17 ENCOUNTER — Encounter (HOSPITAL_COMMUNITY): Payer: Self-pay

## 2011-08-17 ENCOUNTER — Ambulatory Visit (INDEPENDENT_AMBULATORY_CARE_PROVIDER_SITE_OTHER): Payer: PRIVATE HEALTH INSURANCE | Admitting: Cardiology

## 2011-08-17 VITALS — BP 110/66 | HR 81 | Ht 70.0 in | Wt 270.0 lb

## 2011-08-17 DIAGNOSIS — R079 Chest pain, unspecified: Secondary | ICD-10-CM | POA: Insufficient documentation

## 2011-08-17 DIAGNOSIS — Z832 Family history of diseases of the blood and blood-forming organs and certain disorders involving the immune mechanism: Secondary | ICD-10-CM

## 2011-08-17 DIAGNOSIS — I2699 Other pulmonary embolism without acute cor pulmonale: Secondary | ICD-10-CM

## 2011-08-17 DIAGNOSIS — I2510 Atherosclerotic heart disease of native coronary artery without angina pectoris: Secondary | ICD-10-CM

## 2011-08-17 NOTE — Assessment & Plan Note (Signed)
Patient is on lifetime warfarin.  Followed by heme.

## 2011-08-17 NOTE — Progress Notes (Signed)
Patient ID: [name], male   DOB: [birth year], 60 y.o.   MRN: [id number]

## 2011-08-17 NOTE — Assessment & Plan Note (Signed)
Symptoms are somewhat atypical at this point in time.  He has had no recurrence.  His ECG is not highly suggestive of ischemia.  We will continue to treat him medically for now.

## 2011-08-17 NOTE — Assessment & Plan Note (Signed)
See chest pain.  Has DES, and is plavix non responsive by P2 Y12 testing.  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

## 2011-08-17 NOTE — Patient Instructions (Signed)
Your physician recommends that you continue on your current medications as directed. Please refer to the Current Medication list given to you today.  Your physician recommends that you schedule a follow-up appointment in: 4-6 WEEKS

## 2011-08-17 NOTE — Progress Notes (Signed)
HPI:  Patient is in for follow up.  He was recently seen in the ER.  He had left chest pain radiating to shoulder blade which was brief.  He might have had some mild shortness of breath as well.  He has no new symptoms, and for the last few weeks he has done well.  He does take some Kcitrate for kidney stones.  He is doing well overall.  .    Current Outpatient Prescriptions  Medication Sig Dispense Refill  . BENICAR 40 MG tablet TAKE 1 TABLET EVERY DAY  90 tablet  2  . cetirizine (ZYRTEC) 10 MG tablet Take 10 mg by mouth daily.        . chlorthalidone (HYGROTON) 25 MG tablet TAKE 1/2 TABLET DAILY  30 tablet  5  . citalopram (CELEXA) 20 MG tablet Take 1 tablet (20 mg total) by mouth daily.  90 tablet  1  . CRESTOR 20 MG tablet TAKE 1 TABLET BY MOUTH DAILY  90 tablet  1  . fish oil-omega-3 fatty acids 1000 MG capsule Take 3 g by mouth daily.       Marland Kitchen glucose blood test strip 1 each by Other route 3 (three) times daily as needed. Use as instructed       . hydrocortisone (ANUSOL-HC) 25 MG suppository Place 25 mg rectally at bedtime and may repeat dose one time.        . insulin detemir (LEVEMIR) 100 UNIT/ML injection Inject 60 Units into the skin 2 (two) times daily.        . Insulin Pen Needle (B-D ULTRAFINE III SHORT PEN) 31G X 8 MM MISC by Does not apply route. As directed       . metFORMIN (GLUCOPHAGE) 1000 MG tablet Take 500 mg by mouth 2 (two) times daily with a meal.       . metoprolol succinate (TOPROL-XL) 25 MG 24 hr tablet Take 1 tablet (25 mg total) by mouth daily.  30 tablet  6  . nitroGLYCERIN (NITROSTAT) 0.4 MG SL tablet Place 0.4 mg under the tongue every 5 (five) minutes as needed.        Marland Kitchen NOVOLIN N 100 UNIT/ML injection 30 UNITS WITH MEALS AND AT BEDTIME  30 mL  3  . polyethylene glycol (GLYCOLAX) packet Take 17 g by mouth daily.        . prasugrel (EFFIENT) 5 MG TABS Take 1 tablet (5 mg total) by mouth daily.  30 tablet  11  . Probiotic Product (ALIGN) 4 MG CAPS Take by mouth  daily.        Marland Kitchen warfarin (COUMADIN) 5 MG tablet          Allergies  Allergen Reactions  . Piroxicam     REACTION: unspecified: blistering on hands  . Rofecoxib     REACTION: Reaction not known    Past Medical History  Diagnosis Date  . HYPERLIPIDEMIA 04/12/2007  . HYPERTENSION 04/12/2007  . CORONARY ARTERY DISEASE 04/12/2007  . GERD 04/12/2007  . DIABETES MELLITUS, TYPE II, UNCONTROLLED 06/08/2007  . SLEEP APNEA, OBSTRUCTIVE 01/16/2009  . RENAL ARTERY STENOSIS 01/16/2009  . ESOPHAGEAL STRICTURE 01/16/2009  . PSORIASIS 01/16/2009  . PULMONARY EMBOLISM 01/20/2009  . 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 NEPHROLITHIASIS, HX OF 02/27/2009  . DEPRESSION 03/08/2009  . DYSPNEA 03/22/2010  . ANEMIA-UNSPECIFIED 04/28/2010  . DIABETES MELLITUS-TYPE II 09/01/2010  . Atherosclerosis of renal artery   . Coronary atherosclerosis of unspecified type of vessel, native or graft  s/p DES to RCA x2 in 2005; s/p DES to CFX 2009; s/p DES PTCA of RCA for  in stent restenosis 03/2010; s/p Promus DES x2 07/16/10(recurrent pain with abnormal Myoview oct 18,2011; residual at cath 07/16/10: LAD 30%; CFX 30%; RCA 30% ISR.    Past Surgical History  Procedure Date  . Knee arthroscopy 2007  . Rca stent     x2  . Angioplasty 2005 & 2011  . Coronary angioplasty with stent placement   . Esophagogastroduodenoscopy     Family History  Problem Relation Age of Onset  . Pulmonary embolism Mother   . Factor V Leiden deficiency Mother   . Diabetes Brother   . Heart failure Brother   . Hypertension Brother     History   Social History  . Marital Status: Married    Spouse Name: N/A    Number of Children: N/A  . Years of Education: N/A   Occupational History  . Not on file.   Social History Main Topics  . Smoking status: Never Smoker   . Smokeless tobacco: Not on file  . Alcohol Use: No  . Drug Use: No  . Sexually Active:    Other Topics Concern  . Not on file   Social History Narrative  . No narrative  on file    ROS: Please see the HPI.  All other systems reviewed and negative.  PHYSICAL EXAM:  BP 110/66  Pulse 81  Ht 5\' 10"  (1.778 m)  Wt 122.471 kg (270 lb)  BMI 38.74 kg/m2  General: Well developed, well nourished, in no acute distress. Head:  Normocephalic and atraumatic. Neck: no JVD Lungs: Clear to auscultation and percussion. Heart: Normal S1 and S2.  No murmur, rubs or gallops.  Pulses: Pulses normal in all 4 extremities. Extremities: No clubbing or cyanosis. No edema. Neurologic: Alert and oriented x 3.  EKG:  NSR.  Leftward axis.  No acute changes.    ASSESSMENT AND PLAN:

## 2011-08-19 ENCOUNTER — Encounter (HOSPITAL_COMMUNITY): Payer: Self-pay

## 2011-08-22 ENCOUNTER — Other Ambulatory Visit: Payer: Self-pay | Admitting: *Deleted

## 2011-08-22 ENCOUNTER — Encounter (HOSPITAL_COMMUNITY): Payer: Self-pay

## 2011-08-22 ENCOUNTER — Other Ambulatory Visit: Payer: PRIVATE HEALTH INSURANCE | Admitting: Lab

## 2011-08-22 ENCOUNTER — Telehealth: Payer: Self-pay | Admitting: *Deleted

## 2011-08-22 NOTE — Telephone Encounter (Signed)
Wife Corrie Dandy called to say that pt. Is wondering if he can cancell lab appt. Today and just use labs from Dr Cato Mulligan from 11/8.  Also his appt. For today was cancelled due to epic and has not been rescheduled.   Did not see lab orders for today.   Also did not see lab results from Dr. Cato Mulligan from 11/8.  Wife requested that we call pt. Back at work # [id number]  Or cell #  [phone]

## 2011-08-22 NOTE — Telephone Encounter (Signed)
He is not getting active Rx here.  Front office is way behind on appointments - I don't know what to do.  He doesn't need to be seen until after new year. Needs 30 minutes. Can get labs at primary care's office as needed.

## 2011-08-23 ENCOUNTER — Other Ambulatory Visit: Payer: Self-pay | Admitting: *Deleted

## 2011-08-23 ENCOUNTER — Telehealth: Payer: Self-pay | Admitting: *Deleted

## 2011-08-23 DIAGNOSIS — D682 Hereditary deficiency of other clotting factors: Secondary | ICD-10-CM

## 2011-08-23 NOTE — Telephone Encounter (Signed)
Notified pt that he doesn't need an appt. with [place] until after new year but could f/u with PCP if labs needed per Dr. Cyndie Chime since not getting active treatment.  Someone should call him for an appt later.

## 2011-08-24 ENCOUNTER — Encounter (HOSPITAL_COMMUNITY): Payer: Self-pay

## 2011-08-25 ENCOUNTER — Encounter: Payer: Self-pay | Admitting: Internal Medicine

## 2011-08-25 ENCOUNTER — Ambulatory Visit (INDEPENDENT_AMBULATORY_CARE_PROVIDER_SITE_OTHER): Payer: PRIVATE HEALTH INSURANCE | Admitting: Internal Medicine

## 2011-08-25 VITALS — BP 124/72 | HR 95 | Temp 98.5°F | Ht 72.0 in | Wt 273.0 lb

## 2011-08-25 DIAGNOSIS — G4733 Obstructive sleep apnea (adult) (pediatric): Secondary | ICD-10-CM

## 2011-08-25 DIAGNOSIS — I2699 Other pulmonary embolism without acute cor pulmonale: Secondary | ICD-10-CM

## 2011-08-25 DIAGNOSIS — Z23 Encounter for immunization: Secondary | ICD-10-CM

## 2011-08-25 DIAGNOSIS — E119 Type 2 diabetes mellitus without complications: Secondary | ICD-10-CM

## 2011-08-25 DIAGNOSIS — I2510 Atherosclerotic heart disease of native coronary artery without angina pectoris: Secondary | ICD-10-CM

## 2011-08-25 DIAGNOSIS — I10 Essential (primary) hypertension: Secondary | ICD-10-CM

## 2011-08-25 MED ORDER — INSULIN ASPART 100 UNIT/ML ~~LOC~~ SOLN: 44.0000 [IU] | Freq: Three times a day (TID) | SUBCUTANEOUS | Status: DC

## 2011-08-25 NOTE — Assessment & Plan Note (Signed)
Needs to lose weight Discussed weight watchers. Increase novolin to 44 units with each meal

## 2011-08-25 NOTE — Assessment & Plan Note (Signed)
Continues to have day time hypersomnolence Probably needs titration study Will refer to [employer]

## 2011-08-25 NOTE — Assessment & Plan Note (Signed)
No sxs Continue current meds 

## 2011-08-25 NOTE — Progress Notes (Signed)
Patient ID: [name], male   DOB: 1951/08/06, 60 y.o.   MRN: [id number]  patient comes in for followup of multiple medical problems including type 2 diabetes, hyperlipidemia, hypertension. The patient does not check blood sugar or blood pressure at home. The patetient does not follow an exercise or diet program. The patient denies any polyuria, polydipsia.  In the past the patient has gone to diabetic treatment center. The patient is tolerating medications  Without difficulty. The patient does admit to medication compliance.   Pulmonary embolism---no bleeding complications on warfarin  Past Medical History  Diagnosis Date  . HYPERLIPIDEMIA 04/12/2007  . HYPERTENSION 04/12/2007  . CORONARY ARTERY DISEASE 04/12/2007  . GERD 04/12/2007  . DIABETES MELLITUS, TYPE II, UNCONTROLLED 06/08/2007  . SLEEP APNEA, OBSTRUCTIVE 01/16/2009  . RENAL ARTERY STENOSIS 01/16/2009  . ESOPHAGEAL STRICTURE 01/16/2009  . PSORIASIS 01/16/2009  . PULMONARY EMBOLISM 01/20/2009  . 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 NEPHROLITHIASIS, HX OF 02/27/2009  . DEPRESSION 03/08/2009  . DYSPNEA 03/22/2010  . ANEMIA-UNSPECIFIED 04/28/2010  . DIABETES MELLITUS-TYPE II 09/01/2010  . Atherosclerosis of renal artery   . Coronary atherosclerosis of unspecified type of vessel, native or graft     s/p DES to RCA x2 in 2005; s/p DES to CFX 2009; s/p DES PTCA of RCA for  in stent restenosis 03/2010; s/p Promus DES x2 07/16/10(recurrent pain with abnormal Myoview oct 18,2011; residual at cath 07/16/10: LAD 30%; CFX 30%; RCA 30% ISR.    History   Social History  . Marital Status: Married    Spouse Name: N/A    Number of Children: N/A  . Years of Education: N/A   Occupational History  . Not on file.   Social History Main Topics  . Smoking status: Never Smoker   . Smokeless tobacco: Not on file  . Alcohol Use: No  . Drug Use: No  . Sexually Active:    Other Topics Concern  . Not on file   Social History Narrative  . No narrative on  file    Past Surgical History  Procedure Date  . Knee arthroscopy 2007  . Rca stent     x2  . Angioplasty 2005 & 2011  . Coronary angioplasty with stent placement   . Esophagogastroduodenoscopy     Family History  Problem Relation Age of Onset  . Pulmonary embolism Mother   . Factor V Leiden deficiency Mother   . Diabetes Brother   . Heart failure Brother   . Hypertension Brother     Allergies  Allergen Reactions  . Piroxicam     REACTION: unspecified: blistering on hands  . Rofecoxib     REACTION: Reaction not known    Current Outpatient Prescriptions on File Prior to Visit  Medication Sig Dispense Refill  . BENICAR 40 MG tablet TAKE 1 TABLET EVERY DAY  90 tablet  2  . cetirizine (ZYRTEC) 10 MG tablet Take 10 mg by mouth daily.        . chlorthalidone (HYGROTON) 25 MG tablet TAKE 1/2 TABLET DAILY  30 tablet  5  . citalopram (CELEXA) 20 MG tablet Take 1 tablet (20 mg total) by mouth daily.  90 tablet  1  . CRESTOR 20 MG tablet TAKE 1 TABLET BY MOUTH DAILY  90 tablet  1  . fish oil-omega-3 fatty acids 1000 MG capsule Take 3 g by mouth daily.       Marland Kitchen glucose blood test strip 1 each by Other route 3 (three)  times daily as needed. Use as instructed       . hydrocortisone (ANUSOL-HC) 25 MG suppository Place 25 mg rectally at bedtime and may repeat dose one time.        . insulin detemir (LEVEMIR) 100 UNIT/ML injection Inject 60 Units into the skin 2 (two) times daily.        . Insulin Pen Needle (B-D ULTRAFINE III SHORT PEN) 31G X 8 MM MISC by Does not apply route. As directed       . metFORMIN (GLUCOPHAGE) 1000 MG tablet Take 500 mg by mouth 2 (two) times daily with a meal.       . metoprolol succinate (TOPROL-XL) 25 MG 24 hr tablet Take 1 tablet (25 mg total) by mouth daily.  30 tablet  6  . nitroGLYCERIN (NITROSTAT) 0.4 MG SL tablet Place 0.4 mg under the tongue every 5 (five) minutes as needed.        . polyethylene glycol (GLYCOLAX) packet Take 17 g by mouth daily.          . prasugrel (EFFIENT) 5 MG TABS Take 1 tablet (5 mg total) by mouth daily.  30 tablet  11  . Probiotic Product (ALIGN) 4 MG CAPS Take by mouth daily.        Marland Kitchen warfarin (COUMADIN) 5 MG tablet M, Th 7.5 mg qd and all other days 5 mg         patient denies chest pain, shortness of breath, orthopnea. Denies lower extremity edema, abdominal pain, change in appetite, change in bowel movements. Patient denies rashes, musculoskeletal complaints (except chronic neck and back pain). No other specific complaints in a complete review of systems.   BP 124/72  Pulse 95  Temp(Src) 98.5 F (36.9 C) (Oral)  Ht 6' (1.829 m)  Wt 273 lb (123.832 kg)  BMI 37.03 kg/m2   well-developed well-nourished male in no acute distress. HEENT exam atraumatic, normocephalic, neck supple without jugular venous distention. Chest clear to auscultation cardiac exam S1-S2 are regular. Abdominal exam overweight with bowel sounds, soft and nontender. Extremities no edema. Neurologic exam is alert with a normal gait.

## 2011-08-25 NOTE — Assessment & Plan Note (Signed)
Reasonable control  Continue meds 

## 2011-08-26 ENCOUNTER — Telehealth: Payer: Self-pay | Admitting: Oncology

## 2011-08-26 ENCOUNTER — Encounter (HOSPITAL_COMMUNITY): Payer: Self-pay

## 2011-08-26 NOTE — Telephone Encounter (Signed)
Talked to pt,gave him appt in February

## 2011-08-29 ENCOUNTER — Encounter (HOSPITAL_COMMUNITY): Payer: Self-pay

## 2011-08-31 ENCOUNTER — Other Ambulatory Visit: Payer: Self-pay | Admitting: Internal Medicine

## 2011-08-31 ENCOUNTER — Encounter (HOSPITAL_COMMUNITY): Payer: Self-pay

## 2011-09-02 ENCOUNTER — Encounter (HOSPITAL_COMMUNITY): Payer: Self-pay

## 2011-09-05 ENCOUNTER — Encounter (HOSPITAL_COMMUNITY): Payer: Self-pay

## 2011-09-07 ENCOUNTER — Encounter (HOSPITAL_COMMUNITY): Payer: Self-pay

## 2011-09-09 ENCOUNTER — Encounter: Payer: Self-pay | Admitting: Pulmonary Disease

## 2011-09-09 ENCOUNTER — Encounter (HOSPITAL_COMMUNITY): Payer: Self-pay

## 2011-09-09 ENCOUNTER — Ambulatory Visit (INDEPENDENT_AMBULATORY_CARE_PROVIDER_SITE_OTHER): Payer: PRIVATE HEALTH INSURANCE | Admitting: Pulmonary Disease

## 2011-09-09 VITALS — BP 138/76 | HR 75 | Temp 97.8°F | Ht 71.0 in | Wt 276.0 lb

## 2011-09-09 DIAGNOSIS — G4733 Obstructive sleep apnea (adult) (pediatric): Secondary | ICD-10-CM

## 2011-09-09 NOTE — Progress Notes (Deleted)
Subjective:     Patient ID: [name], male   DOB: Aug 27, 1951, 60 y.o.   MRN: [id number]  HPI   Review of Systems  HENT: Positive for sore throat.   Respiratory: Positive for cough and shortness of breath.   Cardiovascular: Positive for chest pain.       Objective:   Physical Exam     Assessment:     ***    Plan:     ***

## 2011-09-09 NOTE — Progress Notes (Signed)
Chief Complaint  Patient presents with  . Advice Only    Pt currently has cpap and wears it everynight. Pt feels tired during the day. Pt machine has not been checked since 2004    History of Present Illness: [name] is a 60 y.o. male for evaluation of sleep apnea.  He had sleep study 09/07/05.  He was found to have AHI 26 and SpO2 low 87%.  He was started on CPAP.  He had initial benefit from CPAP use.  Recently he has not been feeling as rested.  He has not had any change to his pressure setting since original set up.  He has the same mask for the past year.  He thinks his mask is leaking.  His mouth has been getting dry.  He uses a full face mask.  He does not feel like he is getting enough pressure from his machine.  He has started snoring again.  He was in the hospital in October for heart stent, and used hospital CPAP.  He felt his sleep was much better with this.  He goes to bed at 8 pm.  He falls asleep quickly.  He wakes up a few times during the night, but quickly goes back to sleep.  He gets out of bed at 5 am.  He denies morning headaches.  He does not use anything to help him sleep or stay awake.  The patient denies sleep walking, sleep talking, bruxism, or nightmares.  There is no history of restless legs.  The patient denies sleep hallucinations, sleep paralysis, or cataplexy.  His Epworth score is 8 out of 24.  Past Medical History  Diagnosis Date  . HYPERLIPIDEMIA 04/12/2007  . HYPERTENSION 04/12/2007  . CORONARY ARTERY DISEASE 04/12/2007  . GERD 04/12/2007  . DIABETES MELLITUS, TYPE II, UNCONTROLLED 06/08/2007  . SLEEP APNEA, OBSTRUCTIVE 01/16/2009  . RENAL ARTERY STENOSIS 01/16/2009  . ESOPHAGEAL STRICTURE 01/16/2009  . PSORIASIS 01/16/2009  . PULMONARY EMBOLISM 01/20/2009  . 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 NEPHROLITHIASIS, HX OF 02/27/2009  . DEPRESSION 03/08/2009  . DYSPNEA 03/22/2010  . ANEMIA-UNSPECIFIED 04/28/2010  . DIABETES MELLITUS-TYPE II 09/01/2010  .  Atherosclerosis of renal artery   . Coronary atherosclerosis of unspecified type of vessel, native or graft     s/p DES to RCA x2 in 2005; s/p DES to CFX 2009; s/p DES PTCA of RCA for  in stent restenosis 03/2010; s/p Promus DES x2 07/16/10(recurrent pain with abnormal Myoview oct 18,2011; residual at cath 07/16/10: LAD 30%; CFX 30%; RCA 30% ISR.    Past Surgical History  Procedure Date  . Knee arthroscopy 2007  . Rca stent     x2  . Angioplasty 2005 & 2011  . Coronary angioplasty with stent placement   . Esophagogastroduodenoscopy     Current Outpatient Prescriptions on File Prior to Visit  Medication Sig Dispense Refill  . BENICAR 40 MG tablet TAKE 1 TABLET EVERY DAY  90 tablet  2  . cetirizine (ZYRTEC) 10 MG tablet Take 10 mg by mouth daily.        . chlorthalidone (HYGROTON) 25 MG tablet TAKE 1/2 TABLET DAILY  30 tablet  5  . citalopram (CELEXA) 20 MG tablet Take 1 tablet (20 mg total) by mouth daily.  90 tablet  1  . CRESTOR 20 MG tablet TAKE 1 TABLET BY MOUTH DAILY  90 tablet  1  . fish oil-omega-3 fatty acids 1000 MG capsule Take 3 g by mouth daily.       Marland Kitchen  glucose blood test strip 1 each by Other route 3 (three) times daily as needed. Use as instructed       . hydrocortisone (ANUSOL-HC) 25 MG suppository Place 25 mg rectally at bedtime and may repeat dose one time.        . insulin detemir (LEVEMIR) 100 UNIT/ML injection Inject 60 Units into the skin 2 (two) times daily.        . Insulin Pen Needle (B-D ULTRAFINE III SHORT PEN) 31G X 8 MM MISC by Does not apply route. As directed       . metFORMIN (GLUCOPHAGE) 1000 MG tablet Take 500 mg by mouth 2 (two) times daily with a meal.       . metoprolol succinate (TOPROL-XL) 25 MG 24 hr tablet Take 1 tablet (25 mg total) by mouth daily.  30 tablet  6  . nitroGLYCERIN (NITROSTAT) 0.4 MG SL tablet Place 0.4 mg under the tongue every 5 (five) minutes as needed.        . polyethylene glycol (GLYCOLAX) packet Take 17 g by mouth daily.          . prasugrel (EFFIENT) 5 MG TABS Take 1 tablet (5 mg total) by mouth daily.  30 tablet  11  . Probiotic Product (ALIGN) 4 MG CAPS Take by mouth daily.        Marland Kitchen warfarin (COUMADIN) 5 MG tablet M, Th 7.5 mg qd and all other days 5 mg        Allergies  Allergen Reactions  . Piroxicam     REACTION: unspecified: blistering on hands  . Rofecoxib     REACTION: Reaction not known    family history includes Diabetes in his brother; Factor V Leiden deficiency in his mother; Heart failure in his brother; Hypertension in his brother; and Pulmonary embolism in his mother.   reports that he 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 He does not have any smokeless tobacco history on file. He reports that he does not drink alcohol or use illicit drugs.   Blood pressure 138/76, pulse 75, temperature 97.8 F (36.6 C), temperature source Oral, height 5\' 11"  (1.803 m), weight 276 lb (125.193 kg), SpO2 97.00%. Body mass index is 38.49 kg/(m^2).  Physical Exam:  General - Obese HEENT - PERRLA, EOMI, no sinus tenderness, no oral exudate, MP 3, 2+ tonsils, no LAN, no thyromegaly Cardiac - s1s2 regular, no murmur Chest - no wheeze/rales/dullness Abdomen - soft, nontender Extremities - no e/c/c Neurologic - normal strength, CN intact Skin - no rashes Psychiatric - normal mood/behavior  Assessment/Plan:  Outpatient Encounter Prescriptions as of 09/09/2011  Medication Sig Dispense Refill  . BENICAR 40 MG tablet TAKE 1 TABLET EVERY DAY  90 tablet  2  . cetirizine (ZYRTEC) 10 MG tablet Take 10 mg by mouth daily.        . chlorthalidone (HYGROTON) 25 MG tablet TAKE 1/2 TABLET DAILY  30 tablet  5  . citalopram (CELEXA) 20 MG tablet Take 1 tablet (20 mg total) by mouth daily.  90 tablet  1  . CRESTOR 20 MG tablet TAKE 1 TABLET BY MOUTH DAILY  90 tablet  1  . fish oil-omega-3 fatty acids 1000 MG capsule Take 3 g by mouth daily.       Marland Kitchen glucose blood test strip 1 each by Other route 3 (three) times daily as needed. Use as  instructed       . hydrocortisone (ANUSOL-HC) 25 MG suppository Place 25 mg rectally at bedtime and may  repeat dose one time.        . insulin aspart (NOVOLOG) 100 UNIT/ML injection Inject 40 Units into the skin 3 (three) times daily before meals. Addition 40 units at bedtime       . insulin detemir (LEVEMIR) 100 UNIT/ML injection Inject 60 Units into the skin 2 (two) times daily.        . Insulin Pen Needle (B-D ULTRAFINE III SHORT PEN) 31G X 8 MM MISC by Does not apply route. As directed       . metFORMIN (GLUCOPHAGE) 1000 MG tablet Take 500 mg by mouth 2 (two) times daily with a meal.       . metoprolol succinate (TOPROL-XL) 25 MG 24 hr tablet Take 1 tablet (25 mg total) by mouth daily.  30 tablet  6  . nitroGLYCERIN (NITROSTAT) 0.4 MG SL tablet Place 0.4 mg under the tongue every 5 (five) minutes as needed.        . polyethylene glycol (GLYCOLAX) packet Take 17 g by mouth daily.        . prasugrel (EFFIENT) 5 MG TABS Take 1 tablet (5 mg total) by mouth daily.  30 tablet  11  . Probiotic Product (ALIGN) 4 MG CAPS Take by mouth daily.        Marland Kitchen warfarin (COUMADIN) 5 MG tablet M, Th 7.5 mg qd and all other days 5 mg      . DISCONTD: insulin aspart (NOVOLOG) 100 UNIT/ML injection Inject 44 Units into the skin 3 (three) times daily before meals.  5 vial  prn    Artha Chiasson Pager:  [phone] 09/09/2011, 3:24 PM

## 2011-09-09 NOTE — Patient Instructions (Signed)
Will arrange for new CPAP machine and new mask Will call with result of CPAP report Follow up in 3 months

## 2011-09-09 NOTE — Assessment & Plan Note (Signed)
He has moderate sleep apnea.  He has history of cardiovascular disease and diabetes.    I have reviewed his sleep test results with the patient.  Explained how sleep apnea can affect the patient's health.  Driving precautions and importance of weight loss were discussed.  Treatment options for sleep apnea were reviewed.  He had initial benefit from CPAP, but has progression of his symptoms.  This could be related to having an old machine, old mask, and/or needing change in pressure setting.  Will arrange for new machine with auto titration.  Will arrange for new supplies.  If this is unsuccessful, will need in lab titration study.

## 2011-09-12 ENCOUNTER — Encounter (HOSPITAL_COMMUNITY): Payer: Self-pay

## 2011-09-14 ENCOUNTER — Encounter (HOSPITAL_COMMUNITY): Payer: Self-pay

## 2011-09-15 ENCOUNTER — Other Ambulatory Visit: Payer: Self-pay | Admitting: Internal Medicine

## 2011-09-16 ENCOUNTER — Encounter (HOSPITAL_COMMUNITY): Payer: Self-pay

## 2011-09-19 ENCOUNTER — Encounter (HOSPITAL_COMMUNITY): Payer: Self-pay

## 2011-09-20 NOTE — Assessment & Plan Note (Signed)
Remains on chronic anticoagulation therapy.  Should remain life long given his history of PE, with coagulapathy.

## 2011-09-21 ENCOUNTER — Encounter (HOSPITAL_COMMUNITY): Payer: Self-pay

## 2011-09-22 ENCOUNTER — Ambulatory Visit: Payer: PRIVATE HEALTH INSURANCE

## 2011-09-23 ENCOUNTER — Encounter (HOSPITAL_COMMUNITY): Payer: Self-pay

## 2011-09-26 ENCOUNTER — Encounter (HOSPITAL_COMMUNITY): Payer: Self-pay

## 2011-09-28 ENCOUNTER — Encounter (HOSPITAL_COMMUNITY): Payer: Self-pay

## 2011-09-30 ENCOUNTER — Encounter (HOSPITAL_COMMUNITY): Payer: Self-pay

## 2011-10-03 ENCOUNTER — Encounter (HOSPITAL_COMMUNITY): Payer: Self-pay

## 2011-10-05 ENCOUNTER — Ambulatory Visit: Payer: PRIVATE HEALTH INSURANCE | Admitting: Cardiology

## 2011-10-05 ENCOUNTER — Encounter (HOSPITAL_COMMUNITY): Payer: Self-pay

## 2011-10-07 ENCOUNTER — Encounter (HOSPITAL_COMMUNITY): Payer: Self-pay

## 2011-10-10 ENCOUNTER — Encounter (HOSPITAL_COMMUNITY): Payer: Self-pay

## 2011-10-12 ENCOUNTER — Encounter (HOSPITAL_COMMUNITY): Payer: Self-pay

## 2011-10-14 ENCOUNTER — Encounter (HOSPITAL_COMMUNITY): Payer: Self-pay

## 2011-10-17 ENCOUNTER — Encounter (HOSPITAL_COMMUNITY): Payer: Self-pay

## 2011-10-18 ENCOUNTER — Other Ambulatory Visit: Payer: Self-pay | Admitting: Internal Medicine

## 2011-10-19 ENCOUNTER — Encounter (HOSPITAL_COMMUNITY): Payer: Self-pay

## 2011-10-19 ENCOUNTER — Other Ambulatory Visit: Payer: Self-pay | Admitting: *Deleted

## 2011-10-19 MED ORDER — WARFARIN SODIUM 5 MG PO TABS: 5.0000 mg | ORAL_TABLET | Freq: Every day | ORAL | Status: DC

## 2011-10-19 MED ORDER — GLUCOSE BLOOD VI STRP: ORAL_STRIP | Status: AC

## 2011-10-20 ENCOUNTER — Encounter: Payer: Self-pay | Admitting: Family

## 2011-10-20 ENCOUNTER — Ambulatory Visit (INDEPENDENT_AMBULATORY_CARE_PROVIDER_SITE_OTHER): Payer: PRIVATE HEALTH INSURANCE | Admitting: Family

## 2011-10-20 VITALS — BP 122/82 | HR 110 | Temp 98.4°F | Resp 16 | Wt 274.0 lb

## 2011-10-20 DIAGNOSIS — B372 Candidiasis of skin and nail: Secondary | ICD-10-CM

## 2011-10-20 DIAGNOSIS — I2699 Other pulmonary embolism without acute cor pulmonale: Secondary | ICD-10-CM

## 2011-10-20 DIAGNOSIS — L0390 Cellulitis, unspecified: Secondary | ICD-10-CM

## 2011-10-20 DIAGNOSIS — L0291 Cutaneous abscess, unspecified: Secondary | ICD-10-CM

## 2011-10-20 DIAGNOSIS — Z79899 Other long term (current) drug therapy: Secondary | ICD-10-CM

## 2011-10-20 LAB — POCT INR: INR: 2.4

## 2011-10-20 MED ORDER — NYSTATIN 100000 UNIT/GM EX POWD: Freq: Four times a day (QID) | CUTANEOUS | Status: DC

## 2011-10-20 MED ORDER — NYSTATIN 100000 UNIT/GM EX CREA: TOPICAL_CREAM | Freq: Two times a day (BID) | CUTANEOUS | Status: DC

## 2011-10-20 MED ORDER — CEPHALEXIN 500 MG PO TABS: 500.0000 mg | ORAL_TABLET | Freq: Three times a day (TID) | ORAL | Status: AC

## 2011-10-20 NOTE — Progress Notes (Signed)
Subjective:    Patient ID: [name], male    DOB: September 22, 1951, 61 y.o.   MRN: [id number]  HPI D60-year-old white male, nonsmoker, patient of Dr. Abelina Bachelor is seen today with complaints of a radiant, tender, raised area in his right groin that's been present for 3 days. He denies any drainage or discharge from the lesion. Reports to be more red on Monday but is progressively gotten slightly better. He is concerned because he has a history of factor V disease he is currently taken Coumadin. Tolerates the medication well.   Review of Systems  Constitutional: Negative.   Respiratory: Negative.   Cardiovascular: Negative.   Skin: Positive for rash.       Red, tender lesion noted to the right groin.   Neurological: Negative.   Hematological: Negative.   Psychiatric/Behavioral: Negative.    Past Medical History  Diagnosis Date  . HYPERLIPIDEMIA 04/12/2007  . HYPERTENSION 04/12/2007  . CORONARY ARTERY DISEASE 04/12/2007  . GERD 04/12/2007  . DIABETES MELLITUS, TYPE II, UNCONTROLLED 06/08/2007  . SLEEP APNEA, OBSTRUCTIVE 01/16/2009  . RENAL ARTERY STENOSIS 01/16/2009  . ESOPHAGEAL STRICTURE 01/16/2009  . PSORIASIS 01/16/2009  . PULMONARY EMBOLISM 01/20/2009  . 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 NEPHROLITHIASIS, HX OF 02/27/2009  . DEPRESSION 03/08/2009  . DYSPNEA 03/22/2010  . ANEMIA-UNSPECIFIED 04/28/2010  . DIABETES MELLITUS-TYPE II 09/01/2010  . Atherosclerosis of renal artery   . Coronary atherosclerosis of unspecified type of vessel, native or graft     s/p DES to RCA x2 in 2005; s/p DES to CFX 2009; s/p DES PTCA of RCA for  in stent restenosis 03/2010; s/p Promus DES x2 07/16/10(recurrent pain with abnormal Myoview oct 18,2011; residual at cath 07/16/10: LAD 30%; CFX 30%; RCA 30% ISR.    History   Social History  . Marital Status: Married    Spouse Name: N/A    Number of Children: N/A  . Years of Education: N/A   Occupational History  . Not on file.   Social History Main Topics  .  Smoking status: Never Smoker   . Smokeless tobacco: Not on file  . Alcohol Use: No  . Drug Use: No  . Sexually Active: Not on file   Other Topics Concern  . Not on file   Social History Narrative  . No narrative on file    Past Surgical History  Procedure Date  . Knee arthroscopy 2007  . Rca stent     x2  . Angioplasty 2005 & 2011  . Coronary angioplasty with stent placement   . Esophagogastroduodenoscopy     Family History  Problem Relation Age of Onset  . Pulmonary embolism Mother   . Factor V Leiden deficiency Mother   . Diabetes Brother   . Heart failure Brother   . Hypertension Brother     Allergies  Allergen Reactions  . Piroxicam     REACTION: unspecified: blistering on hands  . Rofecoxib     REACTION: Reaction not known    Current Outpatient Prescriptions on File Prior to Visit  Medication Sig Dispense Refill  . BENICAR 40 MG tablet TAKE 1 TABLET EVERY DAY  90 tablet  2  . cetirizine (ZYRTEC) 10 MG tablet Take 10 mg by mouth daily.        . chlorthalidone (HYGROTON) 25 MG tablet TAKE 1/2 TABLET DAILY  30 tablet  5  . citalopram (CELEXA) 20 MG tablet Take 1 tablet (20 mg total) by mouth daily.  90 tablet  1  . citalopram (CELEXA) 20 MG tablet TAKE 1 TABLET BY MOUTH EVERY DAY  30 tablet  5  . CRESTOR 20 MG tablet TAKE 1 TABLET BY MOUTH DAILY  90 tablet  1  . fish oil-omega-3 fatty acids 1000 MG capsule Take 3 g by mouth daily.       Marland Kitchen glucose blood (FREESTYLE LITE) test strip Use as instructed  50 each  11  . hydrocortisone (ANUSOL-HC) 25 MG suppository Place 25 mg rectally at bedtime and may repeat dose one time.        . insulin aspart (NOVOLOG) 100 UNIT/ML injection Inject 40 Units into the skin 3 (three) times daily before meals. Addition 40 units at bedtime       . insulin detemir (LEVEMIR) 100 UNIT/ML injection Inject 60 Units into the skin 2 (two) times daily.        . Insulin Pen Needle (B-D ULTRAFINE III SHORT PEN) 31G X 8 MM MISC by Does not apply  route. As directed       . LEVEMIR FLEXPEN 100 UNIT/ML injection INJECT 50 UNITS SUBCUTANEOULSY TWICE A DAY AS DIRECTED  30 Syringe  11  . metFORMIN (GLUCOPHAGE) 1000 MG tablet Take 500 mg by mouth 2 (two) times daily with a meal.       . metoprolol succinate (TOPROL-XL) 25 MG 24 hr tablet Take 1 tablet (25 mg total) by mouth daily.  30 tablet  6  . nitroGLYCERIN (NITROSTAT) 0.4 MG SL tablet Place 0.4 mg under the tongue every 5 (five) minutes as needed.        . polyethylene glycol (GLYCOLAX) packet Take 17 g by mouth daily.        . prasugrel (EFFIENT) 5 MG TABS Take 1 tablet (5 mg total) by mouth daily.  30 tablet  11  . Probiotic Product (ALIGN) 4 MG CAPS Take by mouth daily.        Marland Kitchen warfarin (COUMADIN) 5 MG tablet Take 1 tablet (5 mg total) by mouth daily. M, Th 7.5 mg qd and all other days 5 mg  90 tablet  3    BP 122/82  Pulse 110  Temp(Src) 98.4 F (36.9 C) (Oral)  Resp 16  Wt 274 lb (124.286 kg)  SpO2 97%chart    Objective:   Physical Exam  Constitutional: He is oriented to person, place, and time.  Neck: Normal range of motion. Neck supple.  Cardiovascular: Normal rate, regular rhythm and normal heart sounds.   Pulmonary/Chest: Effort normal and breath sounds normal.  Abdominal: Soft. Bowel sounds are normal.  Musculoskeletal: Normal range of motion.  Neurological: He is alert and oriented to person, place, and time.  Skin: Skin is warm and dry.       Abscess noted to the right groin. Red, tender, no drainage or discharge. Candida noted to the groin bilaterally.   Psychiatric: He has a normal mood and affect.          Assessment & Plan:  Assessment: Abscess, high risk medication use, history of pulmonary embolism  Plan: We'll treat the abscess the cephalexin, 500mg  one tablet 3 times a day x7 days. We'll treat the Candida of the groin with nystatin powder applied to the affected area twice daily. Keep area clean and dry. PT INR sent today. Patient to call the office  if symptoms worsen or persist, we'll recheck pending the results of his PT/INR and sooner when necessary.

## 2011-10-20 NOTE — Patient Instructions (Addendum)
Abscess An abscess (boil or furuncle) is an infected area that contains a collection of pus.  SYMPTOMS Signs and symptoms of an abscess include pain, tenderness, redness, or hardness. You may feel a moveable soft area under your skin. An abscess can occur anywhere in the body.  TREATMENT  A surgical cut (incision) may be made over your abscess to drain the pus. Gauze may be packed into the space or a drain may be looped through the abscess cavity (pocket). This provides a drain that will allow the cavity to heal from the inside outwards. The abscess may be painful for a few days, but should feel much better if it was drained.  Your abscess, if seen early, may not have localized and may not have been drained. If not, another appointment may be required if it does not get better on its own or with medications. HOME CARE INSTRUCTIONS   Only take over-the-counter or prescription medicines for pain, discomfort, or fever as directed by your caregiver.   Take your antibiotics as directed if they were prescribed. Finish them even if you start to feel better.   Keep the skin and clothes clean around your abscess.   If the abscess was drained, you will need to use gauze dressing to collect any draining pus. Dressings will typically need to be changed 3 or more times a day.   The infection may spread by skin contact with others. Avoid skin contact as much as possible.   Practice good hygiene. This includes regular hand washing, cover any draining skin lesions, and do not share personal care items.   If you participate in sports, do not share athletic equipment, towels, whirlpools, or personal care items. Shower after every practice or tournament.   If a draining area cannot be adequately covered:   Do not participate in sports.   Children should not participate in day care until the wound has healed or drainage stops.   If your caregiver has given you a follow-up appointment, it is very important  to keep that appointment. Not keeping the appointment could result in a much worse infection, chronic or permanent injury, pain, and disability. If there is any problem keeping the appointment, you must call back to this facility for assistance.  SEEK MEDICAL CARE IF:   You develop increased pain, swelling, redness, drainage, or bleeding in the wound site.   You develop signs of generalized infection including muscle aches, chills, fever, or a general ill feeling.   You have an oral temperature above 102 F (38.9 C).  MAKE SURE YOU:   Understand these instructions.   Will watch your condition.   Will get help right away if you are not doing well or get worse.  Document Released: 06/22/2005 Document Revised: 05/25/2011 Document Reviewed: 04/15/2008 [facility] Patient Information 2012 [place], [place].  Yeast Infection of the Skin Some yeast on the skin is normal, but sometimes it causes an infection. If you have a yeast infection, it shows up as white or light brown patches on brown skin. You can see it better in the summer on tan skin. It causes light-colored holes in your suntan. It can happen on any area of the body. This cannot be passed from person to person. HOME CARE  Scrub your skin daily with a dandruff shampoo. Your rash may take a couple weeks to get well.   Do not scratch or itch the rash.  GET HELP RIGHT AWAY IF:   You get another infection from  scratching. The skin may get warm, red, and may ooze fluid.   The infection does not seem to be getting better.  MAKE SURE YOU:  Understand these instructions.   Will watch your condition.   Will get help right away if you are not doing well or get worse.  Document Released: 08/25/2008 Document Revised: 05/25/2011 Document Reviewed: 08/25/2008 [facility] Patient Information 2012 [place], [place].    Latest dosing instructions   Total Sun Mon Tue Wed Thu Fri Sat   40 5 mg 7.5 mg 5 mg 5 mg 7.5 mg 5 mg 5 mg    (5 mg1) (5  mg1.5) (5 mg1) (5 mg1) (5 mg1.5) (5 mg1) (5 mg1)

## 2011-10-21 ENCOUNTER — Encounter (HOSPITAL_COMMUNITY): Payer: Self-pay

## 2011-10-24 ENCOUNTER — Encounter (HOSPITAL_COMMUNITY): Payer: Self-pay

## 2011-10-25 ENCOUNTER — Other Ambulatory Visit: Payer: Self-pay | Admitting: Internal Medicine

## 2011-10-26 ENCOUNTER — Encounter (HOSPITAL_COMMUNITY): Payer: Self-pay

## 2011-10-28 ENCOUNTER — Encounter (HOSPITAL_COMMUNITY): Payer: Self-pay

## 2011-10-28 ENCOUNTER — Other Ambulatory Visit: Payer: Self-pay | Admitting: Internal Medicine

## 2011-10-31 ENCOUNTER — Encounter (HOSPITAL_COMMUNITY): Payer: Self-pay

## 2011-11-02 ENCOUNTER — Encounter (HOSPITAL_COMMUNITY): Payer: Self-pay

## 2011-11-04 ENCOUNTER — Encounter (HOSPITAL_COMMUNITY): Payer: Self-pay

## 2011-11-07 ENCOUNTER — Other Ambulatory Visit: Payer: Self-pay | Admitting: *Deleted

## 2011-11-07 MED ORDER — METOPROLOL SUCCINATE ER 25 MG PO TB24: 25.0000 mg | ORAL_TABLET | Freq: Every day | ORAL | Status: DC

## 2011-11-11 ENCOUNTER — Ambulatory Visit (HOSPITAL_BASED_OUTPATIENT_CLINIC_OR_DEPARTMENT_OTHER): Payer: PRIVATE HEALTH INSURANCE | Admitting: Oncology

## 2011-11-11 ENCOUNTER — Telehealth: Payer: Self-pay | Admitting: Oncology

## 2011-11-11 ENCOUNTER — Encounter: Payer: Self-pay | Admitting: Oncology

## 2011-11-11 VITALS — BP 131/67 | HR 100 | Temp 97.9°F | Ht 71.0 in | Wt 272.8 lb

## 2011-11-11 DIAGNOSIS — D6851 Activated protein C resistance: Secondary | ICD-10-CM

## 2011-11-11 DIAGNOSIS — D72819 Decreased white blood cell count, unspecified: Secondary | ICD-10-CM

## 2011-11-11 DIAGNOSIS — D6859 Other primary thrombophilia: Secondary | ICD-10-CM

## 2011-11-11 DIAGNOSIS — D689 Coagulation defect, unspecified: Secondary | ICD-10-CM

## 2011-11-11 HISTORY — DX: Decreased white blood cell count, unspecified: D72.819

## 2011-11-11 NOTE — Telephone Encounter (Signed)
appt made and printed for 11/06/12.

## 2011-11-11 NOTE — Progress Notes (Signed)
Hematology and Oncology Follow Up Visit  [name] [id number] [birth year] 61 y.o. 11/11/2011 4:53 PM   Principle Diagnosis: Encounter Diagnoses  Name Primary?  . Coagulopathy Yes  . Factor V Leiden carrier      Interim History:   Followup visit for this pleasant 61 year old man with a history of pulmonary emboli which occurred in April of 2010. He had a number of pre-existing risk factors including coronary artery disease, hypertension, obesity, and insulin-dependent diabetes. He was also found to be a heterozygote for the factor V Leiden gene mutation. In addition there is a strong family history of clotting with a likely pulmonary embolus in a maternal grandmother at age 28 and a 14 brother with multiple health problems who died while he was on a ventilator of a suspected pulmonary embolus. Next  He was initially treated on the [name] study within oral Xa inhibitor-Xarelto. This was a six-month study. He was then put on standard Coumadin treatment. Xarelto has now been FDA approved both for prevention and primary treatment of blood clots. However he is on a stable Coumadin dose at this time 7.5 mg Tuesdays and Thursdays and 5 mg other days of the week. He has had no specific bleeding problems on the Coumadin but tells me that his elderly mother just died at 2 months ago of bleeding complications as a result of significant local hemorrhage at site of abdominal subcutaneous Lovenox injections. This has put the fear of got into him in the event that he would ever need to have a Lovenox bridge. I tried to reassure him that this was a very unusual circumstance and his mother's case and I would not expect this to happen to him if he needed a Lovenox bridge in the future  Only new medication change was from Plavix to Effient which together with Coumadin has been more successful than Plavix in maintaining his coronary stent patency. It sounds like testing was done that showed he was resistant to  the antiplatelet affect of the Plavix. He has not had any significant chest pain pressure or palpitations since most recent stent placement in October 2010.  He's had no other interim medical problems.   Medications: reviewed  Allergies:  Allergies  Allergen Reactions  . Piroxicam     REACTION: unspecified: blistering on hands  . Rofecoxib     REACTION: Reaction not known    Review of Systems: Constitutional:   No constitutional symptoms Respiratory: No cough or dyspnea Cardiovascular:  No chest pain pressure or palpitations Gastrointestinal: No abdominal pain or change in bowel habit Genito-Urinary: No urinary tract symptoms Musculoskeletal: No musculoskeletal pain Neurologic: He is considering having carpal tunnel surgery done by Dr. Neita Carp Skin: No rash no ecchymoses Remaining ROS negative.  Physical Exam: Blood pressure 131/67, pulse 100, temperature 97.9 F (36.6 C), temperature source Oral, height 5\' 11"  (1.803 m), weight 272 lb 12.8 oz (123.741 kg). Wt Readings from Last 3 Encounters:  11/11/11 272 lb 12.8 oz (123.741 kg)  10/20/11 274 lb (124.286 kg)  09/09/11 276 lb (125.193 kg)     General appearance: Healthy appearing Caucasian man HENNT: Pharynx no erythema or exudate Lymph nodes: No adenopathy Breasts: Lungs: Clear to auscultation resonant to percussion Heart: Regular cardiac rhythm no murmur or gallop Abdomen: Soft nontender no mass no organomegaly Extremities: No edema no calf tenderness Vascular: No cyanosis Neurologic: Motor strength 5 over 5 reflexes 1+ symmetric Skin: No rash or ecchymosis  Lab Results: Lab Results  Component Value  Date   WBC 3.8* 08/04/2011   HGB 13.0 08/04/2011   HCT 38.9* 08/04/2011   MCV 83.7 08/04/2011   PLT 199.0 08/04/2011     Chemistry      Component Value Date/Time   NA 139 08/04/2011 0918   K 4.2 08/04/2011 0918   CL 104 08/04/2011 0918   CO2 27 08/04/2011 0918   BUN 24* 08/04/2011 0918   CREATININE 1.0  08/04/2011 0918      Component Value Date/Time   CALCIUM 9.9 08/04/2011 0918   ALKPHOS 108 08/04/2011 0918   AST 21 08/04/2011 0918   ALT 26 08/04/2011 0918   BILITOT 0.7 08/04/2011 0918       Impression and Plan: #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 Plan: To remain on chronic anticoagulation with both antiplatelet agents and Coumadin. Until we have an antidote for the new oral anticoagulants, most experts recommend continuing Coumadin if the patient is on a stable dose. I discussed this with the patient and his wife today.  #2. 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 See discussion above  #3. Advanced coronary artery disease status post coronary stenting x2 in the past  #4. Pulmonary emboli secondary to #1. Stable on chronic anticoagulation.  #5. Insulin-dependent diabetes.  #6. Degenerative arthritis of the spine.  #7. Carpal tunnel syndrome under evaluation.  #8. Mild leukopenia. May be medication related. This has been present since April of 2012 and except for some minor fluctuations has been stable. White count differential is normal. I would observe only at this time.   CC:. Dr. Smitty Cords swords; Dr. Shawnie Pons;   Levert Feinstein, MD 2/15/20134:53 PM

## 2011-11-18 ENCOUNTER — Ambulatory Visit (INDEPENDENT_AMBULATORY_CARE_PROVIDER_SITE_OTHER): Payer: PRIVATE HEALTH INSURANCE | Admitting: Cardiology

## 2011-11-18 ENCOUNTER — Encounter: Payer: Self-pay | Admitting: Cardiology

## 2011-11-18 VITALS — BP 120/68 | HR 101 | Ht 71.0 in | Wt 273.0 lb

## 2011-11-18 DIAGNOSIS — D682 Hereditary deficiency of other clotting factors: Secondary | ICD-10-CM

## 2011-11-18 DIAGNOSIS — E785 Hyperlipidemia, unspecified: Secondary | ICD-10-CM

## 2011-11-18 DIAGNOSIS — I2510 Atherosclerotic heart disease of native coronary artery without angina pectoris: Secondary | ICD-10-CM

## 2011-11-18 NOTE — Patient Instructions (Signed)
Your physician wants you to follow-up in: 6 MONTHS.  You will receive a reminder letter in the mail two months in advance. If you don't receive a letter, please call our office to schedule the follow-up appointment.  Your physician recommends that you continue on your current medications as directed. Please refer to the Current Medication list given to you today.  

## 2011-11-25 ENCOUNTER — Telehealth: Payer: Self-pay | Admitting: Cardiology

## 2011-11-25 NOTE — Telephone Encounter (Signed)
Per Dr Riley Kill he needs to speak with the pt about surgery. ([facility])  I left [name] a message that our office did receive request for clearance but Dr Riley Kill is still reviewing at this time.

## 2011-11-25 NOTE — Progress Notes (Signed)
HPI:  [name] is doing well from a cardiac standpoint.  He is not having current chest pain.  He is not having any evidence of excessive bleeding.  He has a need to have bilateral carpal tunnel surgery, and there are issues relative to his anticoagulation. He has been on a regimen for Factor V Leiden and has seen Dr. Cyndie Chime.  He also has CAD and has had fairly extensive stenting, including both first and second generation stents, with in stent restenosis.  Finally, he is hyporesponsive to clopidogrel administration.  He has been maintained now on a combination of warfarin, and aspirin.  His greatest risk of surgery resides in the holding of his medications for Factor V, and very late stent thrombosis from a first generation DES, in combination with the withholding of appopriate antiplatelet therapy.  We have settled on this strategy given his high risks.       Current Outpatient Prescriptions  Medication Sig Dispense Refill  . B-D ULTRAFINE III SHORT PEN 31G X 8 MM MISC USE AS DIRECTED  100 each  11  . BENICAR 40 MG tablet TAKE 1 TABLET EVERY DAY  90 tablet  2  . cetirizine (ZYRTEC) 10 MG tablet Take 10 mg by mouth daily.        . chlorthalidone (HYGROTON) 25 MG tablet TAKE 1/2 TABLET DAILY  30 tablet  5  . citalopram (CELEXA) 20 MG tablet TAKE 1 TABLET BY MOUTH EVERY DAY  30 tablet  5  . CRESTOR 20 MG tablet TAKE 1 TABLET BY MOUTH DAILY  90 tablet  1  . fish oil-omega-3 fatty acids 1000 MG capsule Take 3 g by mouth daily.       Marland Kitchen glucose blood (FREESTYLE LITE) test strip Use as instructed  50 each  11  . hydrocortisone (ANUSOL-HC) 25 MG suppository Place 25 mg rectally at bedtime and may repeat dose one time.        . insulin aspart (NOVOLOG) 100 UNIT/ML injection Inject 40 Units into the skin 3 (three) times daily before meals. Addition 40 units at bedtime       . insulin detemir (LEVEMIR) 100 UNIT/ML injection Inject 60 Units into the skin 2 (two) times daily.        . metFORMIN  (GLUCOPHAGE) 1000 MG tablet Take 500 mg by mouth 2 (two) times daily with a meal.       . metoprolol succinate (TOPROL-XL) 25 MG 24 hr tablet Take 1 tablet (25 mg total) by mouth daily.  30 tablet  6  . nitroGLYCERIN (NITROSTAT) 0.4 MG SL tablet Place 0.4 mg under the tongue every 5 (five) minutes as needed.        . nystatin (MYCOSTATIN) powder Apply topically 4 (four) times daily.  15 g  0  . nystatin cream (MYCOSTATIN) Apply topically 2 (two) times daily.  30 g  0  . polyethylene glycol (GLYCOLAX) packet Take 17 g by mouth as needed.       . prasugrel (EFFIENT) 5 MG TABS Take 1 tablet (5 mg total) by mouth daily.  30 tablet  11  . Probiotic Product (ALIGN) 4 MG CAPS Take by mouth as needed.       . warfarin (COUMADIN) 5 MG tablet Take 1 tablet (5 mg total) by mouth daily. M, Th 7.5 mg qd and all other days 5 mg  90 tablet  3    Allergies  Allergen Reactions  . Piroxicam     REACTION: unspecified:  blistering on hands  . Rofecoxib     REACTION: Reaction not known    Past Medical History  Diagnosis Date  . HYPERLIPIDEMIA 04/12/2007  . HYPERTENSION 04/12/2007  . CORONARY ARTERY DISEASE 04/12/2007  . GERD 04/12/2007  . DIABETES MELLITUS, TYPE II, UNCONTROLLED 06/08/2007  . SLEEP APNEA, OBSTRUCTIVE 01/16/2009  . RENAL ARTERY STENOSIS 01/16/2009  . ESOPHAGEAL STRICTURE 01/16/2009  . PSORIASIS 01/16/2009  . PULMONARY EMBOLISM 01/20/2009  . 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 NEPHROLITHIASIS, HX OF 02/27/2009  . DEPRESSION 03/08/2009  . DYSPNEA 03/22/2010  . ANEMIA-UNSPECIFIED 04/28/2010  . DIABETES MELLITUS-TYPE II 09/01/2010  . Atherosclerosis of renal artery   . Coronary atherosclerosis of unspecified type of vessel, native or graft     s/p DES to RCA x2 in 2005; s/p DES to CFX 2009; s/p DES PTCA of RCA for  in stent restenosis 03/2010; s/p Promus DES x2 07/16/10(recurrent pain with abnormal Myoview oct 18,2011; residual at cath 07/16/10: LAD 30%; CFX 30%; RCA 30% ISR.  Marland Kitchen Leukopenia 11/11/2011     Past Surgical History  Procedure Date  . Knee arthroscopy 2007  . Rca stent     x2  . Angioplasty 2005 & 2011  . Coronary angioplasty with stent placement   . Esophagogastroduodenoscopy     Family History  Problem Relation Age of Onset  . Pulmonary embolism Mother   . Factor V Leiden deficiency Mother   . Diabetes Brother   . Heart failure Brother   . Hypertension Brother     History   Social History  . Marital Status: Married    Spouse Name: N/A    Number of Children: N/A  . Years of Education: N/A   Occupational History  . Not on file.   Social History Main Topics  . Smoking status: Never Smoker   . Smokeless tobacco: Not on file  . Alcohol Use: No  . Drug Use: No  . Sexually Active: Not on file   Other Topics Concern  . Not on file   Social History Narrative  . No narrative on file    ROS: Please see the HPI.  All other systems reviewed and negative.  PHYSICAL EXAM:  BP 120/68  Pulse 101  Ht 5\' 11"  (1.803 m)  Wt 273 lb (123.832 kg)  BMI 38.08 kg/m2  General: Well developed, well nourished, in no acute distress. Head:  Normocephalic and atraumatic. Neck: no JVD Lungs: Clear to auscultation and percussion. Heart: Normal S1 and S2.  No murmur, rubs or gallops.  Abdomen:  Normal bowel sounds; soft; non tender; no organomegaly Pulses: Pulses normal in all 4 extremities. Extremities: No clubbing or cyanosis. No edema. Neurologic: Alert and oriented x 3.  EKG:  ST.  Left axis deviation.  Pulmonary disease pattern.  (rate 101).  ECHO:  January 2010 SUMMARY - Overall left ventricular systolic function was normal. Left ventricular ejection fraction was estimated to be 55 %. There were no left ventricular regional wall motion abnormalities. Left ventricular wall thickness was mildly increased. - The aortic root was at the upper limits of normal in size. - There was mild mitral annular calcification  CATH  October 2011  Unable to  transcribe cath data.  See notes section of Echart.  It is a scanned file, so cannot be included in this data.  Performed by Dr. Charlies Constable.  Placement of PROMUS stents overlapping in RCA.        ASSESSMENT AND PLAN:

## 2011-11-25 NOTE — Telephone Encounter (Signed)
Darl Pikes calling re status on surgical consult ,dr Earl Gala office

## 2011-11-28 NOTE — Progress Notes (Signed)
Received fax from Dr Newell Coral for medical clearance for pt to have carpal tunnel release.    Signed by Dr Cyndie Chime and faxed back [facility] [place], [phone]) with the following instructions:  "**HIgh Risk Patient.  Stop Coumadin for 3 days before surgery.  Resume night of surgery.  Lovenox bridge start day after surgery - 1.5mg /kg/day SQ until Coumadin therapeutic  Give [place] the day for surgery & we will coordinate."  dph

## 2011-12-01 ENCOUNTER — Ambulatory Visit (INDEPENDENT_AMBULATORY_CARE_PROVIDER_SITE_OTHER): Payer: PRIVATE HEALTH INSURANCE

## 2011-12-01 DIAGNOSIS — D682 Hereditary deficiency of other clotting factors: Secondary | ICD-10-CM

## 2011-12-01 DIAGNOSIS — Z5181 Encounter for therapeutic drug level monitoring: Secondary | ICD-10-CM

## 2011-12-01 DIAGNOSIS — Z7901 Long term (current) use of anticoagulants: Secondary | ICD-10-CM

## 2011-12-01 DIAGNOSIS — I2699 Other pulmonary embolism without acute cor pulmonale: Secondary | ICD-10-CM

## 2011-12-01 NOTE — Patient Instructions (Signed)
  Latest dosing instructions   Total Sun Mon Tue Wed Thu Fri Sat   40 5 mg 7.5 mg 5 mg 5 mg 7.5 mg 5 mg 5 mg    (5 mg1) (5 mg1.5) (5 mg1) (5 mg1) (5 mg1.5) (5 mg1) (5 mg1)        

## 2011-12-02 NOTE — Assessment & Plan Note (Signed)
Managed by Dr. Swords. 

## 2011-12-02 NOTE — Assessment & Plan Note (Signed)
See note from Dr. Cyndie Chime.

## 2011-12-02 NOTE — Assessment & Plan Note (Signed)
He is stable from this standpoint.  At present, he should be able to tolerate surgery physically,  He is at increased risk due to his underlying coagulation issues, and multiple stents with both first and second generation devices, placement for AMI, treatment for ISR, and also multiple locations with DM, and platelet hyporesponsiveness.  He remains on a stragety of reduced dose prasugrel in combo with warfarin. He also will need to come off of antiplatelet therapy for the surgery.  The best strategy at present would be to add ASA to his regimen, and continue through the surgery, then resume prasugrel after the surgery if this is ok..  I will confirm with the patient.

## 2011-12-06 NOTE — Telephone Encounter (Signed)
Dr Riley Kill spoke with Dr Newell Coral on 12/02/11.  I will forward this message to Dr Riley Kill for documentation.

## 2011-12-07 ENCOUNTER — Other Ambulatory Visit: Payer: Self-pay | Admitting: Physician Assistant

## 2011-12-14 ENCOUNTER — Ambulatory Visit: Payer: PRIVATE HEALTH INSURANCE | Admitting: Pulmonary Disease

## 2012-01-09 ENCOUNTER — Ambulatory Visit (INDEPENDENT_AMBULATORY_CARE_PROVIDER_SITE_OTHER): Payer: PRIVATE HEALTH INSURANCE | Admitting: Pulmonary Disease

## 2012-01-09 ENCOUNTER — Encounter: Payer: Self-pay | Admitting: Pulmonary Disease

## 2012-01-09 VITALS — BP 110/58 | HR 94 | Temp 98.6°F | Ht 71.0 in | Wt 276.4 lb

## 2012-01-09 DIAGNOSIS — G4733 Obstructive sleep apnea (adult) (pediatric): Secondary | ICD-10-CM

## 2012-01-09 NOTE — Progress Notes (Signed)
Chief Complaint  Patient presents with  . Follow-up    Pt states her wears his cpap everynight night x7-9 hrs a night and with naps. denies any problems with mask/machine.     History of Present Illness: [name] is a 61 y.o. male with OSA.   He is sleeping better.  He uses a full face mask.  He gets about 7 to 8 hours sleep per night.  He was started on a mouthrinse by his dentist for mouth dryness, and this has helped.  He does not need to nap much, but when he does he uses his CPAP.   Past Medical History  Diagnosis Date  . HYPERLIPIDEMIA 04/12/2007  . HYPERTENSION 04/12/2007  . CORONARY ARTERY DISEASE 04/12/2007  . GERD 04/12/2007  . DIABETES MELLITUS, TYPE II, UNCONTROLLED 06/08/2007  . SLEEP APNEA, OBSTRUCTIVE 01/16/2009  . RENAL ARTERY STENOSIS 01/16/2009  . ESOPHAGEAL STRICTURE 01/16/2009  . PSORIASIS 01/16/2009  . PULMONARY EMBOLISM 01/20/2009  . 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 NEPHROLITHIASIS, HX OF 02/27/2009  . DEPRESSION 03/08/2009  . DYSPNEA 03/22/2010  . ANEMIA-UNSPECIFIED 04/28/2010  . DIABETES MELLITUS-TYPE II 09/01/2010  . Atherosclerosis of renal artery   . Coronary atherosclerosis of unspecified type of vessel, native or graft     s/p DES to RCA x2 in 2005; s/p DES to CFX 2009; s/p DES PTCA of RCA for  in stent restenosis 03/2010; s/p Promus DES x2 07/16/10(recurrent pain with abnormal Myoview oct 18,2011; residual at cath 07/16/10: LAD 30%; CFX 30%; RCA 30% ISR.  Marland Kitchen Leukopenia 11/11/2011    Past Surgical History  Procedure Date  . Knee arthroscopy 2007  . Rca stent     x2  . Angioplasty 2005 & 2011  . Coronary angioplasty with stent placement   . Esophagogastroduodenoscopy     Allergies  Allergen Reactions  . Piroxicam     REACTION: unspecified: blistering on hands  . Rofecoxib     REACTION: Reaction not known    Physical Exam:  Blood pressure 110/58, pulse 94, temperature 98.6 F (37 C), temperature source Oral, height 5\' 11"  (1.803 m), weight 276  lb 6.4 oz (125.374 kg), SpO2 96.00%. Body mass index is 38.55 kg/(m^2). Wt Readings from Last 2 Encounters:  01/09/12 276 lb 6.4 oz (125.374 kg)  11/18/11 273 lb (123.832 kg)    HEENT - PERRLA, EOMI, no sinus tenderness, no oral exudate, MP 3, 2+ tonsils, no LAN, no thyromegaly  Cardiac - s1s2 regular, no murmur  Chest - no wheeze/rales/dullness  Abdomen - soft, nontender  Extremities - no e/c/c  Neurologic - normal strength, CN intact  Skin - no rashes  Psychiatric - normal mood/behavior   Assessment/Plan:  Outpatient Encounter Prescriptions as of 01/09/2012  Medication Sig Dispense Refill  . B-D ULTRAFINE III SHORT PEN 31G X 8 MM MISC USE AS DIRECTED  100 each  11  . BENICAR 40 MG tablet TAKE 1 TABLET EVERY DAY  90 tablet  2  . cetirizine (ZYRTEC) 10 MG tablet Take 10 mg by mouth daily.        . chlorthalidone (HYGROTON) 25 MG tablet TAKE 1/2 TABLET DAILY  30 tablet  5  . citalopram (CELEXA) 20 MG tablet TAKE 1 TABLET BY MOUTH EVERY DAY  30 tablet  5  . CRESTOR 20 MG tablet TAKE 1 TABLET BY MOUTH DAILY  90 tablet  1  . fish oil-omega-3 fatty acids 1000 MG capsule Take 3 g by mouth daily.       Marland Kitchen  glucose blood (FREESTYLE LITE) test strip Use as instructed  50 each  11  . hydrocortisone (ANUSOL-HC) 25 MG suppository USE 1 SUPPOSITORY AT BEDTIME X 7 DAYS THEN AS NEEDED  7 suppository  1  . insulin aspart (NOVOLOG) 100 UNIT/ML injection Inject 40 Units into the skin 3 (three) times daily before meals. Addition 40 units at bedtime       . insulin detemir (LEVEMIR) 100 UNIT/ML injection Inject 60 Units into the skin 2 (two) times daily.        . metFORMIN (GLUCOPHAGE) 1000 MG tablet Take 500 mg by mouth 2 (two) times daily with a meal.       . metoprolol succinate (TOPROL-XL) 25 MG 24 hr tablet Take 1 tablet (25 mg total) by mouth daily.  30 tablet  6  . nitroGLYCERIN (NITROSTAT) 0.4 MG SL tablet Place 0.4 mg under the tongue every 5 (five) minutes as needed.        . nystatin  (MYCOSTATIN) powder Apply topically 4 (four) times daily as needed.      . nystatin cream (MYCOSTATIN) Apply topically 2 (two) times daily as needed.      . polyethylene glycol (GLYCOLAX) packet Take 17 g by mouth as needed.       . prasugrel (EFFIENT) 5 MG TABS Take 1 tablet (5 mg total) by mouth daily.  30 tablet  11  . Probiotic Product (ALIGN) 4 MG CAPS Take by mouth as needed.       . warfarin (COUMADIN) 5 MG tablet Take 1 tablet (5 mg total) by mouth daily. M, Th 7.5 mg qd and all other days 5 mg  90 tablet  3  . DISCONTD: nystatin (MYCOSTATIN) powder Apply topically 4 (four) times daily.  15 g  0  . DISCONTD: nystatin cream (MYCOSTATIN) Apply topically 2 (two) times daily.  30 g  0    [name] Pager:  [phone] 01/09/2012, 12:16 PM

## 2012-01-09 NOTE — Assessment & Plan Note (Signed)
He is compliant and reports benefit from CPAP.  Will get a copy of his download and call him with results.

## 2012-01-09 NOTE — Patient Instructions (Signed)
Will get download from CPAP machine and call with results Follow up in one year

## 2012-01-12 ENCOUNTER — Other Ambulatory Visit: Payer: Self-pay | Admitting: Cardiology

## 2012-01-12 ENCOUNTER — Other Ambulatory Visit: Payer: Self-pay | Admitting: Internal Medicine

## 2012-01-12 ENCOUNTER — Ambulatory Visit (INDEPENDENT_AMBULATORY_CARE_PROVIDER_SITE_OTHER): Payer: PRIVATE HEALTH INSURANCE | Admitting: Internal Medicine

## 2012-01-12 DIAGNOSIS — I2699 Other pulmonary embolism without acute cor pulmonale: Secondary | ICD-10-CM

## 2012-01-12 NOTE — Patient Instructions (Signed)
  Latest dosing instructions   Total Sun Mon Tue Wed Thu Fri Sat   40 5 mg 7.5 mg 5 mg 5 mg 7.5 mg 5 mg 5 mg    (5 mg1) (5 mg1.5) (5 mg1) (5 mg1) (5 mg1.5) (5 mg1) (5 mg1)        

## 2012-01-24 ENCOUNTER — Encounter: Payer: Self-pay | Admitting: Internal Medicine

## 2012-01-25 ENCOUNTER — Encounter: Payer: Self-pay | Admitting: Internal Medicine

## 2012-01-31 ENCOUNTER — Encounter: Payer: Self-pay | Admitting: Family

## 2012-01-31 ENCOUNTER — Ambulatory Visit (INDEPENDENT_AMBULATORY_CARE_PROVIDER_SITE_OTHER): Payer: PRIVATE HEALTH INSURANCE | Admitting: Family

## 2012-01-31 ENCOUNTER — Telehealth: Payer: Self-pay | Admitting: Pulmonary Disease

## 2012-01-31 VITALS — BP 106/72 | HR 106 | Temp 98.1°F | Ht 72.0 in | Wt 271.0 lb

## 2012-01-31 DIAGNOSIS — G4733 Obstructive sleep apnea (adult) (pediatric): Secondary | ICD-10-CM

## 2012-01-31 DIAGNOSIS — T148XXA Other injury of unspecified body region, initial encounter: Secondary | ICD-10-CM

## 2012-01-31 DIAGNOSIS — IMO0002 Reserved for concepts with insufficient information to code with codable children: Secondary | ICD-10-CM

## 2012-01-31 DIAGNOSIS — E119 Type 2 diabetes mellitus without complications: Secondary | ICD-10-CM

## 2012-01-31 MED ORDER — CEPHALEXIN 500 MG PO TABS: 500.0000 mg | ORAL_TABLET | Freq: Three times a day (TID) | ORAL | Status: AC

## 2012-01-31 MED ORDER — NYSTATIN 100000 UNIT/GM EX CREA: TOPICAL_CREAM | Freq: Two times a day (BID) | CUTANEOUS | Status: DC | PRN

## 2012-01-31 NOTE — Progress Notes (Signed)
Subjective:    Patient ID: [name], male    DOB: Jul 01, 1951, 61 y.o.   MRN: [id number]  HPI 61 year old WM is in today after a fall 1 week ago and sustaining an abrasion to his left lower leg. He has been applying neosporin to the are about continues to have redness and minimal clear drainage. He has a history of diabetes. Blood sugars in better control.    Review of Systems  Constitutional: Negative.   Respiratory: Negative.   Cardiovascular: Negative.   Skin:       Abrasion to the left lower shin.  Hematological: Negative.   Psychiatric/Behavioral: Negative.    Past Medical History  Diagnosis Date  . HYPERLIPIDEMIA 04/12/2007  . HYPERTENSION 04/12/2007  . CORONARY ARTERY DISEASE 04/12/2007  . GERD 04/12/2007  . DIABETES MELLITUS, TYPE II, UNCONTROLLED 06/08/2007  . SLEEP APNEA, OBSTRUCTIVE 01/16/2009  . RENAL ARTERY STENOSIS 01/16/2009  . ESOPHAGEAL STRICTURE 01/16/2009  . PSORIASIS 01/16/2009  . PULMONARY EMBOLISM 01/20/2009  . 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 NEPHROLITHIASIS, HX OF 02/27/2009  . DEPRESSION 03/08/2009  . DYSPNEA 03/22/2010  . ANEMIA-UNSPECIFIED 04/28/2010  . DIABETES MELLITUS-TYPE II 09/01/2010  . Atherosclerosis of renal artery   . Coronary atherosclerosis of unspecified type of vessel, native or graft     s/p DES to RCA x2 in 2005; s/p DES to CFX 2009; s/p DES PTCA of RCA for  in stent restenosis 03/2010; s/p Promus DES x2 07/16/10(recurrent pain with abnormal Myoview oct 18,2011; residual at cath 07/16/10: LAD 30%; CFX 30%; RCA 30% ISR.  Marland Kitchen Leukopenia 11/11/2011    History   Social History  . Marital Status: Married    Spouse Name: N/A    Number of Children: N/A  . Years of Education: N/A   Occupational History  . Not on file.   Social History Main Topics  . Smoking status: Never Smoker   . Smokeless tobacco: Never Used  . Alcohol Use: No  . Drug Use: No  . Sexually Active: Not on file   Other Topics Concern  . Not on file   Social History  Narrative  . No narrative on file    Past Surgical History  Procedure Date  . Knee arthroscopy 2007  . Rca stent     x2  . Angioplasty 2005 & 2011  . Coronary angioplasty with stent placement   . Esophagogastroduodenoscopy   . Colonoscopy     Family History  Problem Relation Age of Onset  . Pulmonary embolism Mother   . Factor V Leiden deficiency Mother   . Diabetes Brother   . Heart failure Brother   . Hypertension Brother     Allergies  Allergen Reactions  . Piroxicam     REACTION: unspecified: blistering on hands  . Rofecoxib     REACTION: Reaction not known    Current Outpatient Prescriptions on File Prior to Visit  Medication Sig Dispense Refill  . B-D ULTRAFINE III SHORT PEN 31G X 8 MM MISC USE AS DIRECTED  100 each  11  . BENICAR 40 MG tablet TAKE 1 TABLET EVERY DAY  90 tablet  2  . cetirizine (ZYRTEC) 10 MG tablet Take 10 mg by mouth daily.        . chlorthalidone (HYGROTON) 25 MG tablet TAKE 1/2 TABLET DAILY  30 tablet  5  . citalopram (CELEXA) 20 MG tablet TAKE 1 TABLET BY MOUTH EVERY DAY  30 tablet  5  . CRESTOR 20  MG tablet TAKE 1 TABLET BY MOUTH DAILY  90 tablet  1  . EFFIENT 5 MG TABS TAKE 1 TABLET EVERY DAY  30 tablet  11  . fish oil-omega-3 fatty acids 1000 MG capsule Take 3 g by mouth daily.       Marland Kitchen glucose blood (FREESTYLE LITE) test strip Use as instructed  50 each  11  . hydrocortisone (ANUSOL-HC) 25 MG suppository USE 1 SUPPOSITORY AT BEDTIME X 7 DAYS THEN AS NEEDED  7 suppository  1  . insulin aspart (NOVOLOG) 100 UNIT/ML injection Inject 40 Units into the skin 3 (three) times daily before meals. Addition 40 units at bedtime       . insulin detemir (LEVEMIR) 100 UNIT/ML injection Inject 60 Units into the skin 2 (two) times daily.        . metFORMIN (GLUCOPHAGE) 1000 MG tablet TAKE 1 TABLET TWICE A DAY  180 tablet  1  . metoprolol succinate (TOPROL-XL) 25 MG 24 hr tablet Take 1 tablet (25 mg total) by mouth daily.  30 tablet  6  . nitroGLYCERIN  (NITROSTAT) 0.4 MG SL tablet Place 0.4 mg under the tongue every 5 (five) minutes as needed.        . polyethylene glycol (GLYCOLAX) packet Take 17 g by mouth as needed.       . Probiotic Product (ALIGN) 4 MG CAPS Take by mouth as needed.       . warfarin (COUMADIN) 5 MG tablet Take 1 tablet (5 mg total) by mouth daily. M, Th 7.5 mg qd and all other days 5 mg  90 tablet  3  . DISCONTD: metFORMIN (GLUCOPHAGE) 1000 MG tablet Take 500 mg by mouth 2 (two) times daily with a meal.         BP 106/72  Pulse 106  Temp(Src) 98.1 F (36.7 C) (Oral)  Ht 6' (1.829 m)  Wt 271 lb (122.925 kg)  BMI 36.75 kg/m2  SpO2 96%chart    Objective:   Physical Exam  Constitutional: He is oriented to person, place, and time. He appears well-developed and well-nourished.  Neck: Normal range of motion. Neck supple.  Cardiovascular: Normal rate, regular rhythm, normal heart sounds and intact distal pulses.   Pulmonary/Chest: Effort normal and breath sounds normal.  Abdominal: Soft. Bowel sounds are normal.  Neurological: He is alert and oriented to person, place, and time.  Skin:       4 inch abrasion noted to the left lower shin. Redness surrounding the abrasion. But no drainage.   Psychiatric: He has a normal mood and affect.          Assessment & Plan:  Assessment: Left lower leg abrasion, mild cellulitis  Plan: Cephalexin 500mg  TID x 5 days. Continue current medications. Discuss s/s of infection. Call the office if symptoms worsen or persist.

## 2012-01-31 NOTE — Telephone Encounter (Signed)
CPAP 10/28/11 to 01/25/12>>Used on 87 of 90 nights with average 7 hrs 6 min.  Average AHI 0.5 with median CPAP 10 cm H2O and 95th percentile CPAP 13 cm H2O.  Will have my nurse inform patient that CPAP report looks great.  No change to current set up needed.

## 2012-01-31 NOTE — Telephone Encounter (Signed)
lmomtcb x1 

## 2012-02-01 NOTE — Telephone Encounter (Signed)
Returning call can be reached at [phone].[name]

## 2012-02-01 NOTE — Telephone Encounter (Signed)
I spoke with patient about results and he verbalized understanding and had no questions 

## 2012-02-22 ENCOUNTER — Other Ambulatory Visit (INDEPENDENT_AMBULATORY_CARE_PROVIDER_SITE_OTHER): Payer: PRIVATE HEALTH INSURANCE

## 2012-02-22 DIAGNOSIS — D682 Hereditary deficiency of other clotting factors: Secondary | ICD-10-CM

## 2012-02-22 DIAGNOSIS — I2699 Other pulmonary embolism without acute cor pulmonale: Secondary | ICD-10-CM

## 2012-02-22 LAB — CBC WITH DIFFERENTIAL/PLATELET
Basophils Relative: 0.9 % (ref 0.0–3.0)
Eosinophils Relative: 3.6 % (ref 0.0–5.0)
HCT: 39.9 % (ref 39.0–52.0)
Hemoglobin: 13.2 g/dL (ref 13.0–17.0)
Lymphocytes Relative: 14.6 % (ref 12.0–46.0)
Lymphs Abs: 0.6 10*3/uL — ABNORMAL LOW (ref 0.7–4.0)
Monocytes Relative: 0.7 % — ABNORMAL LOW (ref 3.0–12.0)
Neutro Abs: 3.4 10*3/uL (ref 1.4–7.7)
RBC: 4.75 Mil/uL (ref 4.22–5.81)

## 2012-02-22 LAB — POCT INR: INR: 3.1

## 2012-02-22 NOTE — Patient Instructions (Signed)
  Latest dosing instructions   Total Sun Mon Tue Wed Thu Fri Sat   40 5 mg 7.5 mg 5 mg 5 mg 7.5 mg 5 mg 5 mg    (5 mg1) (5 mg1.5) (5 mg1) (5 mg1) (5 mg1.5) (5 mg1) (5 mg1)        

## 2012-02-27 ENCOUNTER — Encounter: Payer: Self-pay | Admitting: Adult Health

## 2012-02-27 ENCOUNTER — Ambulatory Visit (INDEPENDENT_AMBULATORY_CARE_PROVIDER_SITE_OTHER): Payer: PRIVATE HEALTH INSURANCE | Admitting: Adult Health

## 2012-02-27 ENCOUNTER — Ambulatory Visit (INDEPENDENT_AMBULATORY_CARE_PROVIDER_SITE_OTHER)
Admission: RE | Admit: 2012-02-27 | Discharge: 2012-02-27 | Disposition: A | Discharge status: Home / Self Care | Payer: PRIVATE HEALTH INSURANCE | Source: Ambulatory Visit | Attending: Adult Health | Admitting: Adult Health

## 2012-02-27 VITALS — BP 118/74 | HR 66 | Temp 97.7°F | Ht 72.0 in | Wt 271.6 lb

## 2012-02-27 DIAGNOSIS — J209 Acute bronchitis, unspecified: Secondary | ICD-10-CM | POA: Insufficient documentation

## 2012-02-27 MED ORDER — AMOXICILLIN-POT CLAVULANATE 875-125 MG PO TABS: 1.0000 | ORAL_TABLET | Freq: Two times a day (BID) | ORAL | Status: AC

## 2012-02-27 MED ORDER — HYDROCODONE-HOMATROPINE 5-1.5 MG/5ML PO SYRP: 5.0000 mL | ORAL_SOLUTION | Freq: Four times a day (QID) | ORAL | Status: AC | PRN

## 2012-02-27 NOTE — Patient Instructions (Signed)
Augmentin 875mg  Twice daily  -take with food for 7 days  Mucinex DM Twice daily  As needed  Cough/[place]  Hydromet 1-2 tsp every 4-6 hr As needed  Cough- may make you sleepy.  Please contact office for sooner follow up if symptoms do not improve or worsen or seek emergency care  follow up Dr. Delford Field  In 4-6 weeks and As needed

## 2012-02-27 NOTE — Assessment & Plan Note (Signed)
Flare  Xray pending   Plan  Augmentin 875mg  Twice daily  -take with food for 7 days  Mucinex DM Twice daily  As needed  Cough/[place]  Hydromet 1-2 tsp every 4-6 hr As needed  Cough- may make you sleepy.  Please contact office for sooner follow up if symptoms do not improve or worsen or seek emergency care  follow up Dr. Delford Field  In 4-6 weeks and As needed

## 2012-02-27 NOTE — Progress Notes (Signed)
  Subjective:    Patient ID: [name], male    DOB: [birth year], 61 y.o.   MRN: [id number]  HPI 65  WM s/p Pulmonary Embolii 4/10 no DVT. Pt was randomized in [facility] trial. -tx w/  rivaroxibanx 6 months then transitioned over to Coumadin .  Hx of OSA - CPAP   02/27/2012 Acute OV  Complains of right-sided lung pain w/ inspiration, prod cough with brown/yellow/green mucus, runny nose, sore throat x5days .  OTC not helping . Cough is quite barky at times.  No hemoptysis ,edema.  Has been therapeutic on coumdin. No increased leg swelling or calf pain  Last seen by Dr. Delford Field  In 03/2009 .  Follows regularly with Dr. Craige Cotta  For OSA.        Review of Systems Constitutional:   No  weight loss, night sweats,  Fevers, chills +, fatigue, or  lassitude.  HEENT:   No headaches,  Difficulty swallowing,  Tooth/dental problems, or  Sore throat,                No sneezing, itching, ear ache,  +nasal congestion, post nasal drip,   CV:  No chest pain,  Orthopnea, PND, swelling in lower extremities, anasarca, dizziness, palpitations, syncope.   GI  No heartburn, indigestion, abdominal pain, nausea, vomiting, diarrhea, change in bowel habits, loss of appetite, bloody stools.   Resp:  No coughing up of blood.     No chest wall deformity  Skin: no rash or lesions.  GU: no dysuria, change in color of urine, no urgency or frequency.  No flank pain, no hematuria   MS:  No joint pain or swelling.  No decreased range of motion.  No back pain.  Psych:  No change in mood or affect. No depression or anxiety.  No memory loss.         Objective:   Physical Exam GEN: A/Ox3; pleasant , NAD,    HEENT:  [place]/AT,  EACs-clear, TMs-wnl, NOSE-clear drainage , THROAT-clear, no lesions, no postnasal drip or exudate noted.   NECK:  Supple w/ fair ROM; no JVD; normal carotid impulses w/o bruits; no thyromegaly or nodules palpated; no lymphadenopathy.  RESP  Coarse BS  w/o, wheezes/ rales/ or rhonchi.no  accessory muscle use, no dullness to percussion  CARD:  RRR, no m/r/g  , no peripheral edema, pulses intact, no cyanosis or clubbing.  GI:   Soft & nt; nml bowel sounds; no organomegaly or masses detected.  Musco: Warm bil, no deformities or joint swelling noted.   Neuro: alert, no focal deficits noted.    Skin: Warm, no lesions or rashes         Assessment & Plan:

## 2012-02-28 ENCOUNTER — Telehealth: Payer: Self-pay | Admitting: Cardiology

## 2012-02-28 NOTE — Telephone Encounter (Signed)
New msg Pt said he has bronchitis and he wants to talk to you about a test he needs. Please call

## 2012-02-28 NOTE — Telephone Encounter (Signed)
I spoke with the pt and he complains of SOB and pain in his lungs with inspiration.  The pt was seen by Pulmonary yesterday and diagnosed with Bronchitis.  The pt was started on Augmentin and Hydromet. The pt called our office because he is concerned about possibly having another PE with his symptoms.  The pt has not missed any doses of Warfarin and has been therapeutic per lab results.  I made the pt aware that his symptoms are most likely related to his bronchitis (the pt has had a barking cough). At this time the pt will take his Augmentin and he can contact our office if his symptoms do not improve over the next week. Pt agreed with plan.

## 2012-03-01 ENCOUNTER — Ambulatory Visit (INDEPENDENT_AMBULATORY_CARE_PROVIDER_SITE_OTHER): Payer: PRIVATE HEALTH INSURANCE | Admitting: Internal Medicine

## 2012-03-01 VITALS — BP 118/70 | HR 82 | Temp 98.4°F | Wt 270.0 lb

## 2012-03-01 DIAGNOSIS — E119 Type 2 diabetes mellitus without complications: Secondary | ICD-10-CM

## 2012-03-01 DIAGNOSIS — I2510 Atherosclerotic heart disease of native coronary artery without angina pectoris: Secondary | ICD-10-CM

## 2012-03-01 DIAGNOSIS — E785 Hyperlipidemia, unspecified: Secondary | ICD-10-CM

## 2012-03-01 DIAGNOSIS — I10 Essential (primary) hypertension: Secondary | ICD-10-CM

## 2012-03-01 LAB — HEPATIC FUNCTION PANEL
ALT: 18 U/L (ref 0–53)
Total Bilirubin: 0.5 mg/dL (ref 0.3–1.2)
Total Protein: 6.8 g/dL (ref 6.0–8.3)

## 2012-03-01 LAB — BASIC METABOLIC PANEL
Chloride: 105 mEq/L (ref 96–112)
GFR: 82.29 mL/min (ref >60.00)
Glucose, Bld: 252 mg/dL — ABNORMAL HIGH (ref 70–99)
Potassium: 4 mEq/L (ref 3.5–5.1)
Sodium: 139 mEq/L (ref 135–145)

## 2012-03-01 LAB — HEMOGLOBIN A1C: Hgb A1c MFr Bld: 8.6 % — ABNORMAL HIGH (ref 4.6–6.5)

## 2012-03-01 LAB — LIPID PANEL
HDL: 30.8 mg/dL — ABNORMAL LOW (ref >39.00)
Triglycerides: 424 mg/dL — ABNORMAL HIGH (ref 0.0–149.0)
VLDL: 84.8 mg/dL — ABNORMAL HIGH (ref 0.0–40.0)

## 2012-03-01 LAB — LDL CHOLESTEROL, DIRECT: Direct LDL: 74.5 mg/dL

## 2012-03-01 NOTE — Progress Notes (Signed)
patient comes in for followup of multiple medical problems including type 2 diabetes, hyperlipidemia, hypertension. The patient does not check blood sugar or blood pressure at home. The patetient does not follow an exercise or diet program. The patient denies any polyuria, polydipsia.  In the past the patient has gone to diabetic treatment center. The patient is tolerating medications  Without difficulty. The patient does admit to medication compliance.   Recent notes:- see bronchitis  Past Medical History  Diagnosis Date  . HYPERLIPIDEMIA 04/12/2007  . HYPERTENSION 04/12/2007  . CORONARY ARTERY DISEASE 04/12/2007  . GERD 04/12/2007  . DIABETES MELLITUS, TYPE II, UNCONTROLLED 06/08/2007  . SLEEP APNEA, OBSTRUCTIVE 01/16/2009  . RENAL ARTERY STENOSIS 01/16/2009  . ESOPHAGEAL STRICTURE 01/16/2009  . PSORIASIS 01/16/2009  . PULMONARY EMBOLISM 01/20/2009  . 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 NEPHROLITHIASIS, HX OF 02/27/2009  . DEPRESSION 03/08/2009  . DYSPNEA 03/22/2010  . ANEMIA-UNSPECIFIED 04/28/2010  . DIABETES MELLITUS-TYPE II 09/01/2010  . Atherosclerosis of renal artery   . Coronary atherosclerosis of unspecified type of vessel, native or graft     s/p DES to RCA x2 in 2005; s/p DES to CFX 2009; s/p DES PTCA of RCA for  in stent restenosis 03/2010; s/p Promus DES x2 07/16/10(recurrent pain with abnormal Myoview oct 18,2011; residual at cath 07/16/10: LAD 30%; CFX 30%; RCA 30% ISR.  Marland Kitchen Leukopenia 11/11/2011    History   Social History  . Marital Status: Married    Spouse Name: N/A    Number of Children: N/A  . Years of Education: N/A   Occupational History  . Not on file.   Social History Main Topics  . Smoking status: Never Smoker   . Smokeless tobacco: Never Used  . Alcohol Use: No  . Drug Use: No  . Sexually Active: Not on file   Other Topics Concern  . Not on file   Social History Narrative  . No narrative on file    Past Surgical History  Procedure Date  . Knee arthroscopy  2007  . Rca stent     x2  . Angioplasty 2005 & 2011  . Coronary angioplasty with stent placement   . Esophagogastroduodenoscopy   . Colonoscopy     Family History  Problem Relation Age of Onset  . Pulmonary embolism Mother   . Factor V Leiden deficiency Mother   . Diabetes Brother   . Heart failure Brother   . Hypertension Brother     Allergies  Allergen Reactions  . Piroxicam     PHELDENE REACTION: unspecified: blistering on hands  . Rofecoxib     REACTION: Reaction not known    Current Outpatient Prescriptions on File Prior to Visit  Medication Sig Dispense Refill  . amoxicillin-clavulanate (AUGMENTIN) 875-125 MG per tablet Take 1 tablet by mouth 2 (two) times daily.  14 tablet  0  . B-D ULTRAFINE III SHORT PEN 31G X 8 MM MISC USE AS DIRECTED  100 each  11  . BENICAR 40 MG tablet TAKE 1 TABLET EVERY DAY  90 tablet  2  . cetirizine (ZYRTEC) 10 MG tablet Take 10 mg by mouth daily.        . chlorthalidone (HYGROTON) 25 MG tablet TAKE 1/2 TABLET DAILY  30 tablet  5  . citalopram (CELEXA) 20 MG tablet TAKE 1 TABLET BY MOUTH EVERY DAY  30 tablet  5  . CRESTOR 20 MG tablet TAKE 1 TABLET BY MOUTH DAILY  90 tablet  1  .  EFFIENT 5 MG TABS TAKE 1 TABLET EVERY DAY  30 tablet  11  . fish oil-omega-3 fatty acids 1000 MG capsule Take 3 g by mouth daily.       Marland Kitchen glucose blood (FREESTYLE LITE) test strip Use as instructed  50 each  11  . HYDROcodone-homatropine (HYDROMET) 5-1.5 MG/5ML syrup Take 5 mLs by mouth every 6 (six) hours as needed for cough.  240 mL  0  . hydrocortisone (ANUSOL-HC) 25 MG suppository USE 1 SUPPOSITORY AT BEDTIME X 7 DAYS THEN AS NEEDED  7 suppository  1  . insulin aspart (NOVOLOG) 100 UNIT/ML injection Inject 40 Units into the skin 3 (three) times daily before meals. Addition 40 units at bedtime       . insulin detemir (LEVEMIR) 100 UNIT/ML injection Inject 60 Units into the skin 2 (two) times daily.        . metFORMIN (GLUCOPHAGE) 1000 MG tablet Take 500 mg by  mouth 2 (two) times daily with a meal.       . metoprolol succinate (TOPROL-XL) 25 MG 24 hr tablet Take 1 tablet (25 mg total) by mouth daily.  30 tablet  6  . nitroGLYCERIN (NITROSTAT) 0.4 MG SL tablet Place 0.4 mg under the tongue every 5 (five) minutes as needed.        . nystatin cream (MYCOSTATIN) Apply topically 2 (two) times daily as needed.  30 g  0  . polyethylene glycol (GLYCOLAX) packet Take 17 g by mouth as needed.       . Probiotic Product (ALIGN) 4 MG CAPS Take by mouth as needed.       Marland Kitchen DISCONTD: warfarin (COUMADIN) 5 MG tablet Take 1 tablet (5 mg total) by mouth daily. M, Th 7.5 mg qd and all other days 5 mg  90 tablet  3     patient denies chest pain, shortness of breath, orthopnea. Denies lower extremity edema, abdominal pain, change in appetite, change in bowel movements. Patient denies rashes, musculoskeletal complaints. No other specific complaints in a complete review of systems.   BP 118/70  Pulse 82  Temp(Src) 98.4 F (36.9 C) (Oral)  Wt 270 lb (122.471 kg)  well-developed well-nourished male in no acute distress. HEENT exam atraumatic, normocephalic, neck supple without jugular venous distention. Chest clear to auscultation cardiac exam S1-S2 are regular. Abdominal exam overweight with bowel sounds, soft and nontender. Extremities no edema. Neurologic exam is alert with a normal gait.

## 2012-03-02 NOTE — Assessment & Plan Note (Signed)
Lab Results  Component Value Date   HGBA1C 8.6* 03/01/2012   Not well controlled. He'll need additional laboratory work in 4 months. He understands the need to lose weight.

## 2012-03-02 NOTE — Assessment & Plan Note (Signed)
No clinical symptoms. We'll continue attempts at risk factor modification.

## 2012-03-26 ENCOUNTER — Ambulatory Visit (INDEPENDENT_AMBULATORY_CARE_PROVIDER_SITE_OTHER): Payer: PRIVATE HEALTH INSURANCE | Admitting: Family

## 2012-03-26 ENCOUNTER — Telehealth: Payer: Self-pay | Admitting: Family

## 2012-03-26 DIAGNOSIS — I2699 Other pulmonary embolism without acute cor pulmonale: Secondary | ICD-10-CM

## 2012-03-26 LAB — POCT INR: INR: 4.4

## 2012-03-26 NOTE — Patient Instructions (Addendum)
  Latest dosing instructions   Total Sun Mon Tue Wed Thu Fri Sat   35 5 mg 5 mg 5 mg 5 mg 5 mg 5 mg 5 mg    (5 mg1) (5 mg1) (5 mg1) (5 mg1) (5 mg1) (5 mg1) (5 mg1)       Do not take Coumadin tomorrow. Take 5mg  a day thereafter. Recheck in 2 weeks.

## 2012-03-26 NOTE — Telephone Encounter (Signed)
Please schedule INR visit for patient.

## 2012-03-26 NOTE — Telephone Encounter (Signed)
Called pt and schd for inr today at 2:20pm as noted.

## 2012-03-29 ENCOUNTER — Other Ambulatory Visit: Payer: Self-pay | Admitting: Internal Medicine

## 2012-04-04 ENCOUNTER — Ambulatory Visit: Payer: PRIVATE HEALTH INSURANCE | Admitting: Critical Care Medicine

## 2012-04-04 ENCOUNTER — Other Ambulatory Visit: Payer: Self-pay | Admitting: *Deleted

## 2012-04-04 MED ORDER — CITALOPRAM HYDROBROMIDE 20 MG PO TABS: 20.0000 mg | ORAL_TABLET | Freq: Every day | ORAL | Status: DC

## 2012-04-09 ENCOUNTER — Ambulatory Visit (INDEPENDENT_AMBULATORY_CARE_PROVIDER_SITE_OTHER): Payer: PRIVATE HEALTH INSURANCE | Admitting: Family

## 2012-04-09 ENCOUNTER — Telehealth: Payer: Self-pay | Admitting: Family

## 2012-04-09 DIAGNOSIS — I2699 Other pulmonary embolism without acute cor pulmonale: Secondary | ICD-10-CM

## 2012-04-09 NOTE — Telephone Encounter (Signed)
Patient requesting to change Effient to Clopidogrel 75mg  or Ticlopidine 250mg  for cost reasons.  Also wants a generic cholesterol med if you believe its appropriate to replace his Crestor 10mg .

## 2012-04-09 NOTE — Patient Instructions (Addendum)
Take 5mg a day thereafter. Recheck in 4 weeks.      Latest dosing instructions   Total Sun Mon Tue Wed Thu Fri Sat   35 5 mg 5 mg 5 mg 5 mg 5 mg 5 mg 5 mg    (5 mg1) (5 mg1) (5 mg1) (5 mg1) (5 mg1) (5 mg1) (5 mg1)        

## 2012-04-23 NOTE — Telephone Encounter (Signed)
Late entry:  I spoke with Dr. Newell Coral and the patient extensively regarding this issue.    (from the [facility] airport en route to [facility] [place]).

## 2012-05-07 ENCOUNTER — Ambulatory Visit (INDEPENDENT_AMBULATORY_CARE_PROVIDER_SITE_OTHER): Payer: PRIVATE HEALTH INSURANCE | Admitting: Family

## 2012-05-07 DIAGNOSIS — I2699 Other pulmonary embolism without acute cor pulmonale: Secondary | ICD-10-CM

## 2012-05-07 LAB — POCT INR: INR: 2.6

## 2012-05-07 MED ORDER — FUROSEMIDE 20 MG PO TABS: 20.0000 mg | ORAL_TABLET | Freq: Every day | ORAL | Status: DC

## 2012-05-07 NOTE — Patient Instructions (Signed)
Take 5mg  a day thereafter. Recheck in 4 weeks.      Latest dosing instructions   Total Sun Mon Tue Wed Thu Fri Sat   35 5 mg 5 mg 5 mg 5 mg 5 mg 5 mg 5 mg    (5 mg1) (5 mg1) (5 mg1) (5 mg1) (5 mg1) (5 mg1) (5 mg1)

## 2012-05-31 ENCOUNTER — Ambulatory Visit (INDEPENDENT_AMBULATORY_CARE_PROVIDER_SITE_OTHER): Payer: PRIVATE HEALTH INSURANCE | Admitting: Cardiology

## 2012-05-31 ENCOUNTER — Encounter: Payer: Self-pay | Admitting: Cardiology

## 2012-05-31 VITALS — BP 143/68 | HR 77 | Ht 71.0 in | Wt 269.8 lb

## 2012-05-31 DIAGNOSIS — I2510 Atherosclerotic heart disease of native coronary artery without angina pectoris: Secondary | ICD-10-CM

## 2012-05-31 DIAGNOSIS — I2699 Other pulmonary embolism without acute cor pulmonale: Secondary | ICD-10-CM

## 2012-05-31 DIAGNOSIS — I10 Essential (primary) hypertension: Secondary | ICD-10-CM

## 2012-05-31 NOTE — Patient Instructions (Signed)
Your physician wants you to follow-up in: MARCH 2014. You will receive a reminder letter in the mail two months in advance. If you don't receive a letter, please call our office to schedule the follow-up appointment.  Your physician recommends that you continue on your current medications as directed. Please refer to the Current Medication list given to you today.  

## 2012-06-02 NOTE — Progress Notes (Signed)
HPI:  This nice patient is in for followup. They elected not to go ahead with surgery os carpal tunnel at the present time. Insert was largely over his thrombotic risk. Patient is currently being managed strategy that involves reduced dose prasugrel, and warfarin for inheritable coagulation disorder.  He has largely been stable.  No chest pain.  He is a hyporesponder to clopidogrel.  No current chest pain.  Doing well overall.    Current Outpatient Prescriptions  Medication Sig Dispense Refill  . B-D ULTRAFINE III SHORT PEN 31G X 8 MM MISC USE AS DIRECTED  100 each  11  . BENICAR 40 MG tablet TAKE 1 TABLET EVERY DAY  90 tablet  2  . cetirizine (ZYRTEC) 10 MG tablet Take 10 mg by mouth daily.        . chlorthalidone (HYGROTON) 25 MG tablet TAKE 1/2 TABLET DAILY  30 tablet  5  . citalopram (CELEXA) 20 MG tablet Take 1 tablet (20 mg total) by mouth daily.  30 tablet  5  . CRESTOR 20 MG tablet TAKE 1 TABLET BY MOUTH DAILY  90 tablet  1  . EFFIENT 5 MG TABS TAKE 1 TABLET EVERY DAY  30 tablet  11  . fish oil-omega-3 fatty acids 1000 MG capsule Take 3 g by mouth daily.       . furosemide (LASIX) 20 MG tablet Take 20 mg by mouth as needed.      Marland Kitchen glucose blood (FREESTYLE LITE) test strip Use as instructed  50 each  11  . hydrocortisone (ANUSOL-HC) 25 MG suppository USE 1 SUPPOSITORY AT BEDTIME X 7 DAYS THEN AS NEEDED  7 suppository  1  . insulin aspart (NOVOLOG) 100 UNIT/ML injection Inject 40 Units into the skin 3 (three) times daily before meals. Addition 40 units at bedtime       . insulin detemir (LEVEMIR) 100 UNIT/ML injection Inject 60 Units into the skin 2 (two) times daily.        . metFORMIN (GLUCOPHAGE) 1000 MG tablet Take 500 mg by mouth 2 (two) times daily with a meal.       . metoprolol succinate (TOPROL-XL) 25 MG 24 hr tablet Take 1 tablet (25 mg total) by mouth daily.  30 tablet  6  . nitroGLYCERIN (NITROSTAT) 0.4 MG SL tablet Place 0.4 mg under the tongue every 5 (five) minutes as  needed.        . nystatin cream (MYCOSTATIN) Apply topically 2 (two) times daily as needed.  30 g  0  . polyethylene glycol (GLYCOLAX) packet Take 17 g by mouth as needed.       . Probiotic Product (ALIGN) 4 MG CAPS Take by mouth as needed.       . warfarin (COUMADIN) 5 MG tablet Take 5 mg by mouth. 7.5 mg T, Th and 5 mg all other days        Allergies  Allergen Reactions  . Piroxicam     PHELDENE REACTION: unspecified: blistering on hands  . Rofecoxib     REACTION: Reaction not known    Past Medical History  Diagnosis Date  . HYPERLIPIDEMIA 04/12/2007  . HYPERTENSION 04/12/2007  . CORONARY ARTERY DISEASE 04/12/2007  . GERD 04/12/2007  . DIABETES MELLITUS, TYPE II, UNCONTROLLED 06/08/2007  . SLEEP APNEA, OBSTRUCTIVE 01/16/2009  . RENAL ARTERY STENOSIS 01/16/2009  . ESOPHAGEAL STRICTURE 01/16/2009  . PSORIASIS 01/16/2009  . PULMONARY EMBOLISM 01/20/2009  . 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 NEPHROLITHIASIS, HX OF  02/27/2009  . DEPRESSION 03/08/2009  . DYSPNEA 03/22/2010  . ANEMIA-UNSPECIFIED 04/28/2010  . DIABETES MELLITUS-TYPE II 09/01/2010  . Atherosclerosis of renal artery   . Coronary atherosclerosis of unspecified type of vessel, native or graft     s/p DES to RCA x2 in 2005; s/p DES to CFX 2009; s/p DES PTCA of RCA for  in stent restenosis 03/2010; s/p Promus DES x2 07/16/10(recurrent pain with abnormal Myoview oct 18,2011; residual at cath 07/16/10: LAD 30%; CFX 30%; RCA 30% ISR.  Marland Kitchen Leukopenia 11/11/2011    Past Surgical History  Procedure Date  . Knee arthroscopy 2007  . Rca stent     x2  . Angioplasty 2005 & 2011  . Coronary angioplasty with stent placement   . Esophagogastroduodenoscopy   . Colonoscopy     Family History  Problem Relation Age of Onset  . Pulmonary embolism Mother   . Factor V Leiden deficiency Mother   . Diabetes Brother   . Heart failure Brother   . Hypertension Brother     History   Social History  . Marital Status: Married    Spouse Name: N/A     Number of Children: N/A  . Years of Education: N/A   Occupational History  . Not on file.   Social History Main Topics  . Smoking status: Never Smoker   . Smokeless tobacco: Never Used  . Alcohol Use: No  . Drug Use: No  . Sexually Active: Not on file   Other Topics Concern  . Not on file   Social History Narrative  . No narrative on file    ROS: Please see the HPI.  All other systems reviewed and negative.  PHYSICAL EXAM:  BP 143/68  Pulse 77  Ht 5\' 11"  (1.803 m)  Wt 269 lb 12.8 oz (122.38 kg)  BMI 37.63 kg/m2  General: Well developed, well nourished, in no acute distress. Head:  Normocephalic and atraumatic. Neck: no JVD Lungs: Clear to auscultation and percussion. Heart: Normal S1 and S2.  No murmur, rubs or gallops.  Abdomen:  Normal bowel sounds; soft; non tender; no organomegaly Pulses: Pulses normal in all 4 extremities. Extremities: No clubbing or cyanosis. No edema. Neurologic: Alert and oriented x 3.  EKG:  NSR with first degree av block    ASSESSMENT AND PLAN:

## 2012-06-04 ENCOUNTER — Encounter: Payer: PRIVATE HEALTH INSURANCE | Admitting: Family

## 2012-06-07 ENCOUNTER — Ambulatory Visit: Payer: PRIVATE HEALTH INSURANCE | Admitting: Family

## 2012-06-07 ENCOUNTER — Other Ambulatory Visit: Payer: Self-pay | Admitting: Cardiology

## 2012-06-07 ENCOUNTER — Encounter: Payer: Self-pay | Admitting: Family

## 2012-06-07 ENCOUNTER — Ambulatory Visit (INDEPENDENT_AMBULATORY_CARE_PROVIDER_SITE_OTHER): Payer: PRIVATE HEALTH INSURANCE | Admitting: Family

## 2012-06-07 VITALS — BP 140/78 | HR 115 | Temp 98.6°F | Wt 272.0 lb

## 2012-06-07 DIAGNOSIS — H1011 Acute atopic conjunctivitis, right eye: Secondary | ICD-10-CM

## 2012-06-07 DIAGNOSIS — J309 Allergic rhinitis, unspecified: Secondary | ICD-10-CM

## 2012-06-07 DIAGNOSIS — I2699 Other pulmonary embolism without acute cor pulmonale: Secondary | ICD-10-CM

## 2012-06-07 DIAGNOSIS — H1045 Other chronic allergic conjunctivitis: Secondary | ICD-10-CM

## 2012-06-07 MED ORDER — BACITRA-NEOMYCIN-POLYMYXIN-HC 1 % OP OINT: TOPICAL_OINTMENT | Freq: Two times a day (BID) | OPHTHALMIC | Status: AC

## 2012-06-07 NOTE — Progress Notes (Signed)
Subjective:    Patient ID: [name], male    DOB: [birth year], 61 y.o.   MRN: [id number]  HPI 61 year old white male is in today with c/o right eye inflammation and itching x 2 days. Also has sneezing, coughing, and congestion, and has been taking Zyrtec once daily that helps. Denies any fever, sinus pressure or pain.    Review of Systems  HENT: Positive for congestion, rhinorrhea, sneezing and postnasal drip.   Eyes: Positive for redness and itching.  Respiratory: Negative.   Cardiovascular: Negative.   Musculoskeletal: Negative.   Skin: Negative.   Neurological: Negative.   Hematological: Negative.   Psychiatric/Behavioral: Negative.    Past Medical History  Diagnosis Date  . HYPERLIPIDEMIA 04/12/2007  . HYPERTENSION 04/12/2007  . CORONARY ARTERY DISEASE 04/12/2007  . GERD 04/12/2007  . DIABETES MELLITUS, TYPE II, UNCONTROLLED 06/08/2007  . SLEEP APNEA, OBSTRUCTIVE 01/16/2009  . RENAL ARTERY STENOSIS 01/16/2009  . ESOPHAGEAL STRICTURE 01/16/2009  . PSORIASIS 01/16/2009  . PULMONARY EMBOLISM 01/20/2009  . 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 NEPHROLITHIASIS, HX OF 02/27/2009  . DEPRESSION 03/08/2009  . DYSPNEA 03/22/2010  . ANEMIA-UNSPECIFIED 04/28/2010  . DIABETES MELLITUS-TYPE II 09/01/2010  . Atherosclerosis of renal artery   . Coronary atherosclerosis of unspecified type of vessel, native or graft     s/p DES to RCA x2 in 2005; s/p DES to CFX 2009; s/p DES PTCA of RCA for  in stent restenosis 03/2010; s/p Promus DES x2 07/16/10(recurrent pain with abnormal Myoview oct 18,2011; residual at cath 07/16/10: LAD 30%; CFX 30%; RCA 30% ISR.  Marland Kitchen Leukopenia 11/11/2011    History   Social History  . Marital Status: Married    Spouse Name: N/A    Number of Children: N/A  . Years of Education: N/A   Occupational History  . Not on file.   Social History Main Topics  . Smoking status: Never Smoker   . Smokeless tobacco: Never Used  . Alcohol Use: No  . Drug Use: No  . Sexually Active:  Not on file   Other Topics Concern  . Not on file   Social History Narrative  . No narrative on file    Past Surgical History  Procedure Date  . Knee arthroscopy 2007  . Rca stent     x2  . Angioplasty 2005 & 2011  . Coronary angioplasty with stent placement   . Esophagogastroduodenoscopy   . Colonoscopy     Family History  Problem Relation Age of Onset  . Pulmonary embolism Mother   . Factor V Leiden deficiency Mother   . Diabetes Brother   . Heart failure Brother   . Hypertension Brother     Allergies  Allergen Reactions  . Piroxicam     PHELDENE REACTION: unspecified: blistering on hands  . Rofecoxib     REACTION: Reaction not known    Current Outpatient Prescriptions on File Prior to Visit  Medication Sig Dispense Refill  . B-D ULTRAFINE III SHORT PEN 31G X 8 MM MISC USE AS DIRECTED  100 each  11  . BENICAR 40 MG tablet TAKE 1 TABLET EVERY DAY  90 tablet  2  . cetirizine (ZYRTEC) 10 MG tablet Take 10 mg by mouth daily.        . chlorthalidone (HYGROTON) 25 MG tablet TAKE 1/2 TABLET DAILY  30 tablet  5  . citalopram (CELEXA) 20 MG tablet Take 1 tablet (20 mg total) by mouth daily.  30 tablet  5  . CRESTOR 20 MG tablet TAKE 1 TABLET BY MOUTH DAILY  90 tablet  1  . EFFIENT 5 MG TABS TAKE 1 TABLET EVERY DAY  30 tablet  11  . fish oil-omega-3 fatty acids 1000 MG capsule Take 3 g by mouth daily.       . furosemide (LASIX) 20 MG tablet Take 20 mg by mouth as needed.      Marland Kitchen glucose blood (FREESTYLE LITE) test strip Use as instructed  50 each  11  . hydrocortisone (ANUSOL-HC) 25 MG suppository USE 1 SUPPOSITORY AT BEDTIME X 7 DAYS THEN AS NEEDED  7 suppository  1  . insulin aspart (NOVOLOG) 100 UNIT/ML injection Inject 40 Units into the skin 3 (three) times daily before meals. Addition 40 units at bedtime       . insulin detemir (LEVEMIR) 100 UNIT/ML injection Inject 60 Units into the skin 2 (two) times daily.        . metFORMIN (GLUCOPHAGE) 1000 MG tablet Take 500 mg  by mouth 2 (two) times daily with a meal.       . nitroGLYCERIN (NITROSTAT) 0.4 MG SL tablet Place 0.4 mg under the tongue every 5 (five) minutes as needed.        . nystatin cream (MYCOSTATIN) Apply topically 2 (two) times daily as needed.  30 g  0  . polyethylene glycol (GLYCOLAX) packet Take 17 g by mouth as needed.       . Probiotic Product (ALIGN) 4 MG CAPS Take by mouth as needed.       . warfarin (COUMADIN) 5 MG tablet Take 5 mg by mouth. 7.5 mg T, Th and 5 mg all other days      . DISCONTD: metoprolol succinate (TOPROL-XL) 25 MG 24 hr tablet Take 1 tablet (25 mg total) by mouth daily.  30 tablet  6    BP 140/78  Pulse 115  Temp 98.6 F (37 C) (Oral)  Wt 272 lb (123.378 kg)  SpO2 98%chart    Objective:   Physical Exam  Constitutional: He is oriented to person, place, and time. He appears well-developed and well-nourished.  HENT:  Right Ear: External ear normal.  Left Ear: External ear normal.  Nose: Nose normal.  Mouth/Throat: Oropharynx is clear and moist.  Eyes: Conjunctivae normal are normal. Pupils are equal, round, and reactive to light. Right eye exhibits no discharge. Left eye exhibits no discharge.       Mildly edematous to the external canthus of the right eye. Conjunctiva is clear with trace redness.   Neck: Normal range of motion. Neck supple.  Cardiovascular: Normal rate, regular rhythm and normal heart sounds.   Pulmonary/Chest: Effort normal and breath sounds normal.  Neurological: He is alert and oriented to person, place, and time.  Skin: Skin is warm and dry.  Psychiatric: He has a normal mood and affect.          Assessment & Plan:  Assessment: Conjunctivitis, Allergic Rhinitis  Plan: Cortisporin Optic twice a day x 5 days. Call the office if symptoms worsen or persist. Recheck as scheduled and sooner as needed.

## 2012-06-07 NOTE — Patient Instructions (Addendum)
  Latest dosing instructions   Total Sun Mon Tue Wed Thu Fri Sat   35 5 mg 5 mg 5 mg 5 mg 5 mg 5 mg 5 mg    (5 mg1) (5 mg1) (5 mg1) (5 mg1) (5 mg1) (5 mg1) (5 mg1)       Recheck in 6 weeks. 5mg  a day.

## 2012-06-14 ENCOUNTER — Other Ambulatory Visit: Payer: Self-pay | Admitting: *Deleted

## 2012-06-19 NOTE — Assessment & Plan Note (Signed)
Patient is stable.  He has DES, and is on single lower dose Effient as he has multiple DES including first and second generation, and he is clopidogrel hyporesponder.  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  He has not had excessive bleeding with this combo, and clinically it has worked well.

## 2012-06-19 NOTE — Assessment & Plan Note (Signed)
See hem notes.  On life long warfarin.

## 2012-06-19 NOTE — Assessment & Plan Note (Signed)
Ongoing long term issue.  Patient is very bright, and understands the need.

## 2012-06-19 NOTE — Assessment & Plan Note (Signed)
Controlled.  

## 2012-07-04 ENCOUNTER — Telehealth: Payer: Self-pay | Admitting: Internal Medicine

## 2012-07-04 NOTE — Telephone Encounter (Signed)
Patient is scheduled to have surgery on 07/18/12 and he is stopping coumadin/effient on 07/10/12 and he would like to know if he should have coumadin before 07/10/12. Please advise and inform patient of advice.

## 2012-07-04 NOTE — Telephone Encounter (Signed)
Please clarify message. Is patient asking if he needs to have Coumadin level (INR) checked before 07/10/12?

## 2012-07-04 NOTE — Telephone Encounter (Signed)
When does patient need to have his INR checked?

## 2012-07-06 ENCOUNTER — Other Ambulatory Visit: Payer: Self-pay | Admitting: *Deleted

## 2012-07-06 ENCOUNTER — Other Ambulatory Visit: Payer: Self-pay | Admitting: Cardiology

## 2012-07-06 MED ORDER — CHLORTHALIDONE 25 MG PO TABS: 25.0000 mg | ORAL_TABLET | Freq: Every day | ORAL | Status: DC

## 2012-07-06 NOTE — Telephone Encounter (Signed)
Refilled chlorthalidone

## 2012-07-13 ENCOUNTER — Telehealth: Payer: Self-pay | Admitting: Cardiology

## 2012-07-13 NOTE — Telephone Encounter (Signed)
New Problem:    Patient called in Dr. Shearon Stalls office told the patient to call in and let us know that he has been off of his blood thinners since 07/10/12 in preparation for wrist surgery.  Please call back. If you have any questions.

## 2012-07-13 NOTE — Telephone Encounter (Addendum)
FYI: Pt. states that Dr. Bettina Gavia advised him to stop taking Warfarin and Effient on 10-14 for wrist surgery on 10-23. Pt. has his Coumadin  checked at his pcp, Dr. Cato Mulligan' office and pt. states that office is aware as well.

## 2012-07-15 ENCOUNTER — Telehealth: Payer: Self-pay | Admitting: Cardiology

## 2012-07-15 NOTE — Telephone Encounter (Signed)
I called her yesterday and today when I noticed  that he is stop both warfarin and Effient.  He is taking ASA 325 mg in its place for now leading to surgery. After discussion regarding the options, I suggested that he start taking Lovenox 40 mg subcutaneous daily leading into his surgery. He is now nearly 4 days out from his warfarin discontinuation, and it would be safe to start very low-dose Lovenox at this point in time. He is scheduled for surgery in 3 more days. We also discussed his long-term anti-thrombin strategy. He is a clopidogrel nonresponder. His multiple stents in place. He has an inheritable clotting disorder requires warfarin, but he does need antiplatelet coverage. This could involve either aspirin, or it could involve a reduced dose of prasugrel.  We have settled on a latter is probably the best option. He knows that he needs to be careful with regard to bleeding.

## 2012-07-17 NOTE — Telephone Encounter (Signed)
Please see 07/15/12 telephone encounter by Dr Riley Kill.

## 2012-07-19 ENCOUNTER — Encounter: Payer: PRIVATE HEALTH INSURANCE | Admitting: Family

## 2012-07-19 DIAGNOSIS — Z0289 Encounter for other administrative examinations: Secondary | ICD-10-CM

## 2012-07-20 ENCOUNTER — Encounter: Payer: PRIVATE HEALTH INSURANCE | Admitting: Family

## 2012-07-29 ENCOUNTER — Other Ambulatory Visit: Payer: Self-pay | Admitting: Internal Medicine

## 2012-08-07 ENCOUNTER — Telehealth: Payer: Self-pay | Admitting: Internal Medicine

## 2012-08-07 NOTE — Telephone Encounter (Signed)
Dr Cato Mulligan doesn't work past 10.  He will need to be rescheduled

## 2012-08-07 NOTE — Telephone Encounter (Signed)
Pt is having carpal tunnel surgery (7am) the morning of his appt (its @ 8:45am). He would like to know if he could be worked in later that day? He should be done by 10am.  Pt's blood work is 11/27.

## 2012-08-07 NOTE — Telephone Encounter (Signed)
Pt rescheduled appt 09/18/12 at 8am

## 2012-08-16 ENCOUNTER — Telehealth: Payer: Self-pay | Admitting: Cardiology

## 2012-08-16 NOTE — Telephone Encounter (Signed)
New problem:    Carpal tunnel surgery on 12/3.  Was told by Dr. Riley Kill to call regarding the Lovenox shot.

## 2012-08-16 NOTE — Telephone Encounter (Signed)
Left message to call back  

## 2012-08-17 NOTE — Telephone Encounter (Signed)
F/u   Pt returning nurse call, he can be reached at  [phone]

## 2012-08-17 NOTE — Telephone Encounter (Signed)
Left message on cell phone. No answer at work number.  I will try to call the pt again later today.

## 2012-08-17 NOTE — Telephone Encounter (Signed)
Left message on cell phone.  I will try to reach the pt again later today.

## 2012-08-17 NOTE — Telephone Encounter (Signed)
Pt rtn lauren's call pls call [phone]

## 2012-08-17 NOTE — Telephone Encounter (Signed)
I spoke with the pt and he had right wrist carpal tunnel surgery on 10/23 and Dr Riley Kill prescribed 3 doses of Lovenox at that time.  The pt is scheduled to have surgery 12/3 on his left wrist with Dr Newell Coral.  The pt would like to know if Dr Riley Kill recommends Lovenox also for this procedure.  If so the pt needs Rx sent to [facility] on [address].

## 2012-08-22 ENCOUNTER — Other Ambulatory Visit: Payer: PRIVATE HEALTH INSURANCE

## 2012-08-22 MED ORDER — ENOXAPARIN SODIUM 100 MG/ML ~~LOC~~ SOLN: 90.0000 mg | Freq: Two times a day (BID) | SUBCUTANEOUS | Status: DC

## 2012-08-22 MED ORDER — WARFARIN SODIUM 5 MG PO TABS: 5.0000 mg | ORAL_TABLET | Freq: Every day | ORAL | Status: DC

## 2012-08-22 NOTE — Telephone Encounter (Signed)
Per Dr Riley Kill he does recommend that the pt be bridged with lovenox prior to surgery on 08/28/12. I will forward this message to Kennon Rounds Pharm-D to review and make recommendations.

## 2012-08-22 NOTE — Telephone Encounter (Signed)
I spoke with Kennon Rounds Pharm-D and the pt has not had an INR since September.  Per Kennon Rounds the pt needs to have his INR checked with Dr Marliss Coots office on 08/24/12 since they manage his coumadin.  The pt will need to be bridged with Lovenox per Dr Rosalyn Charters recommendation.  I made the pt aware of this information and he states that he already has an appointment for blood work on 08/24/12.  The pt will follow-up with PCP for lovenox bridge.

## 2012-08-22 NOTE — Addendum Note (Signed)
Addended byAdline Mango B on: 08/22/2012 04:53 PM   Modules accepted: Orders

## 2012-08-22 NOTE — Telephone Encounter (Signed)
Please advise patient that he needs to restart Coumadin

## 2012-08-22 NOTE — Telephone Encounter (Signed)
Please advise patient that he needs to restart coumadin with a Lovenox bridge. Coumadin once daily at 6pm and Lovenox once daily. Please schedule appointment for patient to have an INR on Monday. I will fax meds to the pharmacy.

## 2012-08-24 ENCOUNTER — Other Ambulatory Visit (INDEPENDENT_AMBULATORY_CARE_PROVIDER_SITE_OTHER): Payer: PRIVATE HEALTH INSURANCE

## 2012-08-24 ENCOUNTER — Telehealth: Payer: Self-pay | Admitting: Cardiology

## 2012-08-24 DIAGNOSIS — I2699 Other pulmonary embolism without acute cor pulmonale: Secondary | ICD-10-CM

## 2012-08-24 DIAGNOSIS — E785 Hyperlipidemia, unspecified: Secondary | ICD-10-CM

## 2012-08-24 DIAGNOSIS — I10 Essential (primary) hypertension: Secondary | ICD-10-CM

## 2012-08-24 LAB — BASIC METABOLIC PANEL
BUN: 30 mg/dL — ABNORMAL HIGH (ref 6–23)
CO2: 26 mEq/L (ref 19–32)
Calcium: 10.2 mg/dL (ref 8.4–10.5)
Creatinine, Ser: 0.9 mg/dL (ref 0.4–1.5)
GFR: 89.41 mL/min (ref >60.00)
Glucose, Bld: 124 mg/dL — ABNORMAL HIGH (ref 70–99)
Sodium: 139 mEq/L (ref 135–145)

## 2012-08-24 LAB — HEPATIC FUNCTION PANEL
Albumin: 3.7 g/dL (ref 3.5–5.2)
Alkaline Phosphatase: 94 U/L (ref 39–117)
Total Protein: 7.2 g/dL (ref 6.0–8.3)

## 2012-08-24 LAB — HEMOGLOBIN A1C: Hgb A1c MFr Bld: 8.1 % — ABNORMAL HIGH (ref 4.6–6.5)

## 2012-08-24 LAB — LIPID PANEL
Cholesterol: 259 mg/dL — ABNORMAL HIGH (ref 0–200)
HDL: 28.1 mg/dL — ABNORMAL LOW (ref >39.00)
Triglycerides: 630 mg/dL — ABNORMAL HIGH (ref 0.0–149.0)

## 2012-08-24 LAB — LDL CHOLESTEROL, DIRECT: Direct LDL: 141.9 mg/dL

## 2012-08-24 NOTE — Telephone Encounter (Signed)
Opened in error

## 2012-08-24 NOTE — Telephone Encounter (Signed)
Patient is confused about the whole coumadin start and stop and has surgery on Tuesday. Patient will come in this morning to have labs and requests to go ahead and have INR done as well. Please advise.

## 2012-08-24 NOTE — Telephone Encounter (Signed)
New Problem:    Called in needing clarification on the patient's enoxaparin (LOVENOX) 100 MG/ML injection.  Please call back.

## 2012-08-24 NOTE — Telephone Encounter (Signed)
Ok per Dr. Cato Mulligan to have INR on 08/24/12

## 2012-08-27 NOTE — Telephone Encounter (Signed)
Appointment scheduled to see [facility] on 08/29/12. Check pt/inr at [facility]

## 2012-08-27 NOTE — Telephone Encounter (Signed)
I think you are taking care of this for the patient.

## 2012-08-28 ENCOUNTER — Ambulatory Visit: Payer: PRIVATE HEALTH INSURANCE | Admitting: Internal Medicine

## 2012-08-29 ENCOUNTER — Ambulatory Visit (INDEPENDENT_AMBULATORY_CARE_PROVIDER_SITE_OTHER): Payer: PRIVATE HEALTH INSURANCE | Admitting: Internal Medicine

## 2012-08-29 ENCOUNTER — Encounter: Payer: Self-pay | Admitting: Internal Medicine

## 2012-08-29 ENCOUNTER — Encounter: Payer: PRIVATE HEALTH INSURANCE | Admitting: Family

## 2012-08-29 VITALS — BP 136/84 | HR 96 | Temp 98.0°F | Wt 272.0 lb

## 2012-08-29 DIAGNOSIS — E785 Hyperlipidemia, unspecified: Secondary | ICD-10-CM

## 2012-08-29 DIAGNOSIS — I2510 Atherosclerotic heart disease of native coronary artery without angina pectoris: Secondary | ICD-10-CM

## 2012-08-29 DIAGNOSIS — E119 Type 2 diabetes mellitus without complications: Secondary | ICD-10-CM

## 2012-08-29 MED ORDER — ATORVASTATIN CALCIUM 40 MG PO TABS: 40.0000 mg | ORAL_TABLET | Freq: Every day | ORAL | Status: DC

## 2012-08-29 NOTE — Assessment & Plan Note (Signed)
No sxs Continue same meds 

## 2012-08-29 NOTE — Assessment & Plan Note (Signed)
Better control Continue meds Weight loss is key

## 2012-08-29 NOTE — Progress Notes (Signed)
Patient ID: [name], male   DOB: 11-01-1950, 61 y.o.   MRN: [id number]  patient comes in for followup of multiple medical problems including type 2 diabetes, hyperlipidemia, hypertension. The patient does not check blood sugar or blood pressure at home. The patetient does not follow an exercise or diet program. The patient denies any polyuria, polydipsia.  In the past the patient has gone to diabetic treatment center. The patient is tolerating medications  Without difficulty. The patient does admit to medication compliance except not taking crestor-- would like to take a generic He would like to lose weight but "can't". Says his neck and back hurts with any exertion.   Recent CTS surgery  Past Medical History  Diagnosis Date  . HYPERLIPIDEMIA 04/12/2007  . HYPERTENSION 04/12/2007  . CORONARY ARTERY DISEASE 04/12/2007  . GERD 04/12/2007  . DIABETES MELLITUS, TYPE II, UNCONTROLLED 06/08/2007  . SLEEP APNEA, OBSTRUCTIVE 01/16/2009  . RENAL ARTERY STENOSIS 01/16/2009  . ESOPHAGEAL STRICTURE 01/16/2009  . PSORIASIS 01/16/2009  . PULMONARY EMBOLISM 01/20/2009  . 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 NEPHROLITHIASIS, HX OF 02/27/2009  . DEPRESSION 03/08/2009  . DYSPNEA 03/22/2010  . ANEMIA-UNSPECIFIED 04/28/2010  . DIABETES MELLITUS-TYPE II 09/01/2010  . Atherosclerosis of renal artery   . Coronary atherosclerosis of unspecified type of vessel, native or graft     s/p DES to RCA x2 in 2005; s/p DES to CFX 2009; s/p DES PTCA of RCA for  in stent restenosis 03/2010; s/p Promus DES x2 07/16/10(recurrent pain with abnormal Myoview oct 18,2011; residual at cath 07/16/10: LAD 30%; CFX 30%; RCA 30% ISR.  Marland Kitchen Leukopenia 11/11/2011    History   Social History  . Marital Status: Married    Spouse Name: N/A    Number of Children: N/A  . Years of Education: N/A   Occupational History  . Not on file.   Social History Main Topics  . Smoking status: Never Smoker   . Smokeless tobacco: Never Used  . Alcohol Use:  No  . Drug Use: No  . Sexually Active: Not on file   Other Topics Concern  . Not on file   Social History Narrative  . No narrative on file    Past Surgical History  Procedure Date  . Knee arthroscopy 2007  . Rca stent     x2  . Angioplasty 2005 & 2011  . Coronary angioplasty with stent placement   . Esophagogastroduodenoscopy   . Colonoscopy     Family History  Problem Relation Age of Onset  . Pulmonary embolism Mother   . Factor V Leiden deficiency Mother   . Diabetes Brother   . Heart failure Brother   . Hypertension Brother     Allergies  Allergen Reactions  . Piroxicam     PHELDENE REACTION: unspecified: blistering on hands  . Rofecoxib     REACTION: Reaction not known    Current Outpatient Prescriptions on File Prior to Visit  Medication Sig Dispense Refill  . B-D ULTRAFINE III SHORT PEN 31G X 8 MM MISC USE AS DIRECTED  100 each  11  . BENICAR 40 MG tablet TAKE 1 TABLET EVERY DAY  90 tablet  2  . cetirizine (ZYRTEC) 10 MG tablet Take 10 mg by mouth daily.        . chlorthalidone (HYGROTON) 25 MG tablet Take 1 tablet (25 mg total) by mouth daily.  30 tablet  5  . citalopram (CELEXA) 20 MG tablet Take 1 tablet (20 mg  total) by mouth daily.  30 tablet  5  . EFFIENT 5 MG TABS TAKE 1 TABLET EVERY DAY  30 tablet  11  . fish oil-omega-3 fatty acids 1000 MG capsule Take 3 g by mouth daily.       . furosemide (LASIX) 20 MG tablet Take 20 mg by mouth as needed.      Marland Kitchen glucose blood (FREESTYLE LITE) test strip Use as instructed  50 each  11  . hydrocortisone (ANUSOL-HC) 25 MG suppository USE 1 SUPPOSITORY AT BEDTIME X 7 DAYS THEN AS NEEDED  7 suppository  1  . insulin aspart (NOVOLOG) 100 UNIT/ML injection Inject 40 Units into the skin 3 (three) times daily before meals. Addition 40 units at bedtime       . insulin detemir (LEVEMIR) 100 UNIT/ML injection Inject 60 Units into the skin 2 (two) times daily.        . metFORMIN (GLUCOPHAGE) 1000 MG tablet Take 500 mg by  mouth 2 (two) times daily with a meal.       . metoprolol succinate (TOPROL-XL) 25 MG 24 hr tablet TAKE 1 TABLET BY MOUTH EVERY DAY  30 tablet  6  . nitroGLYCERIN (NITROSTAT) 0.4 MG SL tablet Place 0.4 mg under the tongue every 5 (five) minutes as needed.        . nystatin cream (MYCOSTATIN) Apply topically 2 (two) times daily as needed.  30 g  0  . warfarin (COUMADIN) 5 MG tablet Take 1 tablet (5 mg total) by mouth daily. 7.5 mg T, Th and 5 mg all other days  30 tablet  2     patient denies chest pain, shortness of breath, orthopnea. Denies lower extremity edema, abdominal pain, change in appetite, change in bowel movements. Patient denies rashes, musculoskeletal complaints. No other specific complaints in a complete review of systems.   BP 136/84  Pulse 96  Temp 98 F (36.7 C) (Oral)  Wt 272 lb (123.378 kg)  well-developed well-nourished male in no acute distress. HEENT exam atraumatic, normocephalic, neck supple without jugular venous distention. Chest clear to auscultation cardiac exam S1-S2 are regular. Abdominal exam overweight with bowel sounds, soft and nontender. Extremities no edema. Neurologic exam is alert with a normal gait.

## 2012-08-29 NOTE — Assessment & Plan Note (Signed)
He is off meds Start lipitor Recheck labs in 6 months

## 2012-08-30 NOTE — Assessment & Plan Note (Signed)
This is his major problem Discussed need for aggressive weight loss

## 2012-08-31 ENCOUNTER — Encounter: Payer: PRIVATE HEALTH INSURANCE | Admitting: Family

## 2012-08-31 ENCOUNTER — Other Ambulatory Visit: Payer: Self-pay | Admitting: Internal Medicine

## 2012-08-31 ENCOUNTER — Ambulatory Visit (INDEPENDENT_AMBULATORY_CARE_PROVIDER_SITE_OTHER): Payer: PRIVATE HEALTH INSURANCE | Admitting: Family

## 2012-08-31 ENCOUNTER — Telehealth: Payer: Self-pay | Admitting: Family

## 2012-08-31 DIAGNOSIS — I2699 Other pulmonary embolism without acute cor pulmonale: Secondary | ICD-10-CM

## 2012-08-31 NOTE — Telephone Encounter (Signed)
Pt aware and appt scheduled.  

## 2012-08-31 NOTE — Patient Instructions (Signed)
Take an additional 1/2 tab today and tomorrow. Take 5mg  a day thereafter. Recheck in 2 weeks.      Latest dosing instructions   Total Sun Mon Tue Wed Thu Fri Sat   35 5 mg 5 mg 5 mg 5 mg 5 mg 5 mg 5 mg    (5 mg1) (5 mg1) (5 mg1) (5 mg1) (5 mg1) (5 mg1) (5 mg1)

## 2012-08-31 NOTE — Telephone Encounter (Signed)
Advise patient to Take an additional 1/2 tab of Coumadin today and tomorrow. Take 5mg  a day thereafter. Recheck in 2 weeks.

## 2012-09-13 ENCOUNTER — Other Ambulatory Visit: Payer: Self-pay | Admitting: Internal Medicine

## 2012-09-13 NOTE — Telephone Encounter (Signed)
Pt needs refill of NOVOLOG 100unit.  Pls only send 30 day supply. Insurance will not pay for any more than that. Pt is completely out. He used last shot. Will need at 6pm tonite.

## 2012-09-14 ENCOUNTER — Ambulatory Visit (INDEPENDENT_AMBULATORY_CARE_PROVIDER_SITE_OTHER): Payer: PRIVATE HEALTH INSURANCE | Admitting: Family

## 2012-09-14 DIAGNOSIS — I2699 Other pulmonary embolism without acute cor pulmonale: Secondary | ICD-10-CM

## 2012-09-14 LAB — POCT INR: INR: 2

## 2012-09-14 NOTE — Patient Instructions (Addendum)
5mg  daily. Recheck in 3-4 weeks.     Latest dosing instructions   Total Sun Mon Tue Wed Thu Fri Sat   35 5 mg 5 mg 5 mg 5 mg 5 mg 5 mg 5 mg    (5 mg1) (5 mg1) (5 mg1) (5 mg1) (5 mg1) (5 mg1) (5 mg1)

## 2012-09-18 ENCOUNTER — Ambulatory Visit: Payer: PRIVATE HEALTH INSURANCE | Admitting: Internal Medicine

## 2012-09-26 LAB — HM DIABETES EYE EXAM

## 2012-10-09 ENCOUNTER — Other Ambulatory Visit: Payer: Self-pay | Admitting: Internal Medicine

## 2012-10-12 ENCOUNTER — Encounter: Payer: PRIVATE HEALTH INSURANCE | Admitting: Family

## 2012-10-15 ENCOUNTER — Telehealth: Payer: Self-pay | Admitting: Oncology

## 2012-10-15 ENCOUNTER — Other Ambulatory Visit: Payer: Self-pay | Admitting: Internal Medicine

## 2012-10-15 ENCOUNTER — Ambulatory Visit (INDEPENDENT_AMBULATORY_CARE_PROVIDER_SITE_OTHER): Payer: BC Managed Care – PPO | Admitting: Family

## 2012-10-15 DIAGNOSIS — I2699 Other pulmonary embolism without acute cor pulmonale: Secondary | ICD-10-CM

## 2012-10-15 NOTE — Patient Instructions (Signed)
5mg  daily. Recheck in 4 weeks.     Latest dosing instructions   Total Sun Mon Tue Wed Thu Fri Sat   35 5 mg 5 mg 5 mg 5 mg 5 mg 5 mg 5 mg    (5 mg1) (5 mg1) (5 mg1) (5 mg1) (5 mg1) (5 mg1) (5 mg1)

## 2012-10-15 NOTE — Telephone Encounter (Signed)
Called pt and left message appt has been r/s from 2/11 to 2/13, Thursday, due to MD PAL

## 2012-10-18 ENCOUNTER — Telehealth: Payer: Self-pay | Admitting: Internal Medicine

## 2012-10-18 NOTE — Telephone Encounter (Signed)
[facility]  [phone] Trying to get prior authorization for the following drugs: BENICAR 40 MG tablet LEVEMIR FLEXPEN 100 UNIT/ML injection insulin aspart (NOVOLOG) 100 UNIT/ML injection Pharm states they have been trying to resolve this a while.

## 2012-10-19 NOTE — Telephone Encounter (Signed)
Benicar is approved. Still waiting on response from [employer] on Levemir and Novolog - pharm is aware of status.

## 2012-11-06 ENCOUNTER — Ambulatory Visit: Payer: PRIVATE HEALTH INSURANCE | Admitting: Oncology

## 2012-11-08 ENCOUNTER — Other Ambulatory Visit: Payer: Self-pay | Admitting: *Deleted

## 2012-11-08 ENCOUNTER — Ambulatory Visit: Payer: PRIVATE HEALTH INSURANCE | Admitting: Oncology

## 2012-11-09 ENCOUNTER — Ambulatory Visit: Payer: PRIVATE HEALTH INSURANCE | Admitting: Oncology

## 2012-11-12 ENCOUNTER — Ambulatory Visit (INDEPENDENT_AMBULATORY_CARE_PROVIDER_SITE_OTHER): Payer: BC Managed Care – PPO | Admitting: Family

## 2012-11-12 DIAGNOSIS — I2699 Other pulmonary embolism without acute cor pulmonale: Secondary | ICD-10-CM

## 2012-11-12 NOTE — Patient Instructions (Addendum)
Take an extra 1/2 tab today only. Then continue 5mg  a day.  Recheck in 4 weeks.    Anticoagulation Dose Instructions as of 11/12/2012     [name] Tue Wed Thu Fri Sat   New Dose 5 mg 5 mg 5 mg 5 mg 5 mg 5 mg 5 mg    Description       Take an extra 1/2 tab today only. Then continue 5mg  a day.  Recheck in 4 weeks.

## 2012-11-15 ENCOUNTER — Telehealth: Payer: Self-pay | Admitting: Oncology

## 2012-11-15 NOTE — Telephone Encounter (Signed)
Talked to patient gave him appt for APril 2014, Md only

## 2012-11-20 ENCOUNTER — Other Ambulatory Visit: Payer: Self-pay | Admitting: Internal Medicine

## 2012-11-26 ENCOUNTER — Telehealth: Payer: Self-pay | Admitting: Internal Medicine

## 2012-11-26 MED ORDER — GLUCOSE BLOOD VI STRP: 1.0000 | ORAL_STRIP | Freq: Two times a day (BID) | Status: DC

## 2012-11-26 MED ORDER — BAYER CONTOUR MONITOR DEVI: 1.0000 | Freq: Every day | Status: DC

## 2012-11-26 NOTE — Telephone Encounter (Signed)
rx sent in electronically 

## 2012-11-26 NOTE — Telephone Encounter (Signed)
Patient's insurance does cover FREESTYLE LITE TEST STRIPS. Per insurance, pt must use either Bayer (Contour) strips or LifeScan (One Touch). Thank you. ([facility] [address]

## 2012-11-26 NOTE — Telephone Encounter (Signed)
Any strip, any glucometer is fine

## 2012-11-27 ENCOUNTER — Ambulatory Visit (INDEPENDENT_AMBULATORY_CARE_PROVIDER_SITE_OTHER): Payer: BC Managed Care – PPO | Admitting: Cardiology

## 2012-11-27 ENCOUNTER — Other Ambulatory Visit: Payer: Self-pay | Admitting: Family

## 2012-11-27 ENCOUNTER — Encounter: Payer: Self-pay | Admitting: Cardiology

## 2012-11-27 VITALS — BP 124/82 | HR 112 | Ht 71.0 in | Wt 275.0 lb

## 2012-11-27 DIAGNOSIS — I2699 Other pulmonary embolism without acute cor pulmonale: Secondary | ICD-10-CM

## 2012-11-27 DIAGNOSIS — R000 Tachycardia, unspecified: Secondary | ICD-10-CM

## 2012-11-27 LAB — CBC WITH DIFFERENTIAL/PLATELET
Basophils Relative: 0.9 % (ref 0.0–3.0)
Eosinophils Relative: 3.9 % (ref 0.0–5.0)
HCT: 38.1 % — ABNORMAL LOW (ref 39.0–52.0)
Lymphs Abs: 0.9 10*3/uL (ref 0.7–4.0)
MCV: 85 fl (ref 78.0–100.0)
Monocytes Absolute: 0 10*3/uL — ABNORMAL LOW (ref 0.1–1.0)
Platelets: 261 10*3/uL (ref 150.0–400.0)
RBC: 4.48 Mil/uL (ref 4.22–5.81)
WBC: 4.1 10*3/uL — ABNORMAL LOW (ref 4.5–10.5)

## 2012-11-27 LAB — BASIC METABOLIC PANEL
BUN: 25 mg/dL — ABNORMAL HIGH (ref 6–23)
CO2: 26 mEq/L (ref 19–32)
Chloride: 105 mEq/L (ref 96–112)
Potassium: 4.5 mEq/L (ref 3.5–5.1)

## 2012-11-27 NOTE — Assessment & Plan Note (Signed)
He thinks he has been stable from this standpoint.

## 2012-11-27 NOTE — Assessment & Plan Note (Signed)
The patient has had DES to the CFX and RCA, and ISR in the RCA with subsequent treatment with second generation stents.  He has been stable and maintained on low dose Effient with warfarin  (Factor V Leiden).  No current symptoms

## 2012-11-27 NOTE — Assessment & Plan Note (Signed)
Prior history.  No acute symptoms to suggest.  Lifelong anticoagulation.

## 2012-11-27 NOTE — Assessment & Plan Note (Signed)
Not sure of etiology.  Has moderate dyspnea.  Lean toward echocardiogram to r/o progressive pulm HTN, and also will check CBC to make sure he is not anemic  (Mild hemorrhoidal bleeding).  I will see him back in three weeks to confirm.

## 2012-11-27 NOTE — Progress Notes (Signed)
HPI:  [name] is in for a follow up visit.  Overall he remains stable and got through his carpel tunnel release ok.  He does note a faster HR today, though it settled down. He is moderately short of breath.  He wears his CPAP nightly and he thinks the fit is pretty good.    Current Outpatient Prescriptions  Medication Sig Dispense Refill  . atorvastatin (LIPITOR) 40 MG tablet Take 1 tablet (40 mg total) by mouth daily.  90 tablet  3  . B-D ULTRAFINE III SHORT PEN 31G X 8 MM MISC USE AS DIRECTED  100 each  11  . BENICAR 40 MG tablet TAKE 1 TABLET EVERY DAY  90 tablet  2  . Blood Glucose Monitoring Suppl (CONTOUR BLOOD GLUCOSE SYSTEM) DEVI 1 Device by Does not apply route daily.  1 Device  0  . chlorthalidone (HYGROTON) 25 MG tablet Take 1 tablet (25 mg total) by mouth daily.  30 tablet  5  . citalopram (CELEXA) 20 MG tablet TAKE 1 TABLET BY MOUTH DAILY  30 tablet  5  . EFFIENT 5 MG TABS TAKE 1 TABLET EVERY DAY  30 tablet  11  . fish oil-omega-3 fatty acids 1000 MG capsule Take 3 g by mouth daily.       . furosemide (LASIX) 20 MG tablet Take 20 mg by mouth as needed.      Marland Kitchen glucose blood (BAYER CONTOUR TEST) test strip 1 each by Other route 2 (two) times daily.  100 each  12  . hydrocortisone (ANUSOL-HC) 25 MG suppository USE 1 SUPPOSITORY AT BEDTIME X 7 DAYS THEN AS NEEDED  7 suppository  1  . insulin aspart (NOVOLOG) 100 UNIT/ML injection Inject 44 Units into the skin 4 (four) times daily. Addition 40 units at bedtime      . insulin detemir (LEVEMIR) 100 UNIT/ML injection Inject 60 Units into the skin 2 (two) times daily.        Marland Kitchen LEVEMIR FLEXPEN 100 UNIT/ML injection INJECT 50 UNITS SUBCUTANEOULSY TWICE A DAY AS DIRECTED  3 mL  5  . metFORMIN (GLUCOPHAGE) 1000 MG tablet Take 500 mg by mouth 2 (two) times daily with a meal.       . metoprolol succinate (TOPROL-XL) 25 MG 24 hr tablet TAKE 1 TABLET BY MOUTH EVERY DAY  30 tablet  6  . nitroGLYCERIN (NITROSTAT) 0.4 MG SL tablet Place 0.4 mg under  the tongue every 5 (five) minutes as needed.        . nystatin cream (MYCOSTATIN) Apply topically 2 (two) times daily as needed.  30 g  0  . warfarin (COUMADIN) 5 MG tablet TAKE 1 TABLET (5MG ) BY MOUTH DAILY**1 & 1/2 TABLETS ON (7.5 MG) TUESDAYS AND THURSDAYS**  30 tablet  2   No current facility-administered medications for this visit.    Allergies  Allergen Reactions  . Piroxicam     PHELDENE REACTION: unspecified: blistering on hands  . Rofecoxib     REACTION: Reaction not known    Past Medical History  Diagnosis Date  . HYPERLIPIDEMIA 04/12/2007  . HYPERTENSION 04/12/2007  . CORONARY ARTERY DISEASE 04/12/2007  . GERD 04/12/2007  . DIABETES MELLITUS, TYPE II, UNCONTROLLED 06/08/2007  . SLEEP APNEA, OBSTRUCTIVE 01/16/2009  . RENAL ARTERY STENOSIS 01/16/2009  . ESOPHAGEAL STRICTURE 01/16/2009  . PSORIASIS 01/16/2009  . PULMONARY EMBOLISM 01/20/2009  . 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 NEPHROLITHIASIS, HX OF 02/27/2009  . DEPRESSION 03/08/2009  . DYSPNEA 03/22/2010  .  ANEMIA-UNSPECIFIED 04/28/2010  . DIABETES MELLITUS-TYPE II 09/01/2010  . Atherosclerosis of renal artery   . Coronary atherosclerosis of unspecified type of vessel, native or graft     s/p DES to RCA x2 in 2005; s/p DES to CFX 2009; s/p DES PTCA of RCA for  in stent restenosis 03/2010; s/p Promus DES x2 07/16/10(recurrent pain with abnormal Myoview oct 18,2011; residual at cath 07/16/10: LAD 30%; CFX 30%; RCA 30% ISR.  Marland Kitchen Leukopenia 11/11/2011    Past Surgical History  Procedure Laterality Date  . Knee arthroscopy  2007  . Rca stent      x2  . Angioplasty  2005 & 2011  . Coronary angioplasty with stent placement    . Esophagogastroduodenoscopy    . Colonoscopy    . Carpal tunnel release  2013    nudelman    Family History  Problem Relation Age of Onset  . Pulmonary embolism Mother   . Factor V Leiden deficiency Mother   . Diabetes Brother   . Heart failure Brother   . Hypertension Brother     History   Social  History  . Marital Status: Married    Spouse Name: N/A    Number of Children: N/A  . Years of Education: N/A   Occupational History  . Not on file.   Social History Main Topics  . Smoking status: Never Smoker   . Smokeless tobacco: Never Used  . Alcohol Use: No  . Drug Use: No  . Sexually Active: Not on file   Other Topics Concern  . Not on file   Social History Narrative  . No narrative on file    ROS: Please see the HPI.  All other systems reviewed and negative.  PHYSICAL EXAM:  BP 124/82  Pulse 112  Ht 5\' 11"  (1.803 m)  Wt 275 lb (124.739 kg)  BMI 38.37 kg/m2  SpO2 95%  P96 when rechecked.    General: Well developed, well nourished, in no acute distress. Head:  Normocephalic and atraumatic. Neck: no JVD Lungs: Clear to auscultation and percussion. Heart: Normal S1 and S2.  No murmur, rubs or gallops. Extremities: No clubbing or cyanosis. No edema. Neurologic: Alert and oriented x 3.  EKG: ST.  Pulmonary disease pattern.  Left axis deviation.    ASSESSMENT AND PLAN:

## 2012-11-27 NOTE — Assessment & Plan Note (Signed)
Controlled on meds

## 2012-11-27 NOTE — Patient Instructions (Addendum)
Your physician has requested that you have an echocardiogram. Echocardiography is a painless test that uses sound waves to create images of your heart. It provides your doctor with information about the size and shape of your heart and how well your heart's chambers and valves are working. This procedure takes approximately one hour. There are no restrictions for this procedure.  Your physician recommends that you return for lab work in: today (bmet, cbc)  Your physician recommends that you schedule a follow-up appointment in: 3 weeks

## 2012-12-04 ENCOUNTER — Other Ambulatory Visit (INDEPENDENT_AMBULATORY_CARE_PROVIDER_SITE_OTHER): Payer: BC Managed Care – PPO

## 2012-12-04 ENCOUNTER — Ambulatory Visit (INDEPENDENT_AMBULATORY_CARE_PROVIDER_SITE_OTHER): Payer: BC Managed Care – PPO | Admitting: Family

## 2012-12-04 ENCOUNTER — Encounter: Payer: Self-pay | Admitting: Family

## 2012-12-04 VITALS — BP 134/78 | HR 98 | Temp 98.7°F | Wt 274.0 lb

## 2012-12-04 DIAGNOSIS — M1710 Unilateral primary osteoarthritis, unspecified knee: Secondary | ICD-10-CM

## 2012-12-04 DIAGNOSIS — D72819 Decreased white blood cell count, unspecified: Secondary | ICD-10-CM

## 2012-12-04 DIAGNOSIS — K219 Gastro-esophageal reflux disease without esophagitis: Secondary | ICD-10-CM

## 2012-12-04 DIAGNOSIS — IMO0002 Reserved for concepts with insufficient information to code with codable children: Secondary | ICD-10-CM

## 2012-12-04 DIAGNOSIS — I2699 Other pulmonary embolism without acute cor pulmonale: Secondary | ICD-10-CM

## 2012-12-04 LAB — LIPID PANEL
Cholesterol: 163 mg/dL (ref 0–200)
HDL: 26.9 mg/dL — ABNORMAL LOW (ref >39.00)
Triglycerides: 559 mg/dL — ABNORMAL HIGH (ref 0.0–149.0)

## 2012-12-04 LAB — BASIC METABOLIC PANEL
Calcium: 9.9 mg/dL (ref 8.4–10.5)
GFR: 75.85 mL/min (ref >60.00)
Glucose, Bld: 309 mg/dL — ABNORMAL HIGH (ref 70–99)
Sodium: 135 mEq/L (ref 135–145)

## 2012-12-04 LAB — HEMOGLOBIN A1C: Hgb A1c MFr Bld: 8.4 % — ABNORMAL HIGH (ref 4.6–6.5)

## 2012-12-04 LAB — H. PYLORI ANTIBODY, IGG: H Pylori IgG: NEGATIVE

## 2012-12-04 LAB — HEPATIC FUNCTION PANEL
ALT: 33 U/L (ref 0–53)
Bilirubin, Direct: 0.1 mg/dL (ref 0.0–0.3)
Total Protein: 7.4 g/dL (ref 6.0–8.3)

## 2012-12-04 LAB — POCT INR: INR: 1.5

## 2012-12-04 MED ORDER — ESOMEPRAZOLE MAGNESIUM 40 MG PO CPDR: 40.0000 mg | DELAYED_RELEASE_CAPSULE | Freq: Every day | ORAL | Status: DC

## 2012-12-04 MED ORDER — TRAMADOL HCL 50 MG PO TABS: 50.0000 mg | ORAL_TABLET | Freq: Three times a day (TID) | ORAL | Status: DC | PRN

## 2012-12-04 NOTE — Progress Notes (Signed)
Subjective:    Patient ID: [name], male    DOB: 1951-05-19, 62 y.o.   MRN: [id number]  HPI 62 year old white male, nonsmoker, patient of Dr. Cato Mulligan is in today with complaints of a abdominal bloating and feeling stuffed a last several months particularly after eating. Its worse with food such as hamburgers or drinks like tea. He has a history of reflux and had previously been on Nexium but has discontinued the Nexium is currently taken over-the-counter H2 blocker. Denies any heartburn or indigestion. Patient is on Coumadin  Patient has concerns of pain in his knees and lower legs that's been ongoing for several months and continue with. Rates the pain a 6/10. Has seen the pain clinic in the past but is not currently taken any prescription medication for pain. He takes Tylenol occasionally that doesn't work well. Denies any numbness or tingling. Denies any loss of sensation.  Patient was seen by Dr. Riley Kill recently with complaints of a racing heart. EKG was normal. Patient scheduled for an echocardiogram tomorrow. His calcium was found to be slightly elevated.     Review of Systems  Constitutional: Negative.   HENT: Negative.   Respiratory: Negative.   Cardiovascular: Positive for palpitations.  Gastrointestinal: Negative.   Genitourinary: Negative.   Musculoskeletal: Negative.   Skin: Negative.   Neurological: Negative.   Psychiatric/Behavioral: Negative.    Past Medical History  Diagnosis Date  . HYPERLIPIDEMIA 04/12/2007  . HYPERTENSION 04/12/2007  . CORONARY ARTERY DISEASE 04/12/2007  . GERD 04/12/2007  . DIABETES MELLITUS, TYPE II, UNCONTROLLED 06/08/2007  . SLEEP APNEA, OBSTRUCTIVE 01/16/2009  . RENAL ARTERY STENOSIS 01/16/2009  . ESOPHAGEAL STRICTURE 01/16/2009  . PSORIASIS 01/16/2009  . PULMONARY EMBOLISM 01/20/2009  . 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 NEPHROLITHIASIS, HX OF 02/27/2009  . DEPRESSION 03/08/2009  . DYSPNEA 03/22/2010  . ANEMIA-UNSPECIFIED 04/28/2010  .  DIABETES MELLITUS-TYPE II 09/01/2010  . Atherosclerosis of renal artery   . Coronary atherosclerosis of unspecified type of vessel, native or graft     s/p DES to RCA x2 in 2005; s/p DES to CFX 2009; s/p DES PTCA of RCA for  in stent restenosis 03/2010; s/p Promus DES x2 07/16/10(recurrent pain with abnormal Myoview oct 18,2011; residual at cath 07/16/10: LAD 30%; CFX 30%; RCA 30% ISR.  Marland Kitchen Leukopenia 11/11/2011    History   Social History  . Marital Status: Married    Spouse Name: N/A    Number of Children: N/A  . Years of Education: N/A   Occupational History  . Not on file.   Social History Main Topics  . Smoking status: Never Smoker   . Smokeless tobacco: Never Used  . Alcohol Use: No  . Drug Use: No  . Sexually Active: Not on file   Other Topics Concern  . Not on file   Social History Narrative  . No narrative on file    Past Surgical History  Procedure Laterality Date  . Knee arthroscopy  2007  . Rca stent      x2  . Angioplasty  2005 & 2011  . Coronary angioplasty with stent placement    . Esophagogastroduodenoscopy    . Colonoscopy    . Carpal tunnel release  2013    nudelman    Family History  Problem Relation Age of Onset  . Pulmonary embolism Mother   . Factor V Leiden deficiency Mother   . Diabetes Brother   . Heart failure Brother   . Hypertension Brother  Allergies  Allergen Reactions  . Piroxicam     PHELDENE REACTION: unspecified: blistering on hands  . Rofecoxib     REACTION: Reaction not known    Current Outpatient Prescriptions on File Prior to Visit  Medication Sig Dispense Refill  . atorvastatin (LIPITOR) 40 MG tablet Take 1 tablet (40 mg total) by mouth daily.  90 tablet  3  . B-D ULTRAFINE III SHORT PEN 31G X 8 MM MISC USE AS DIRECTED  100 each  11  . BENICAR 40 MG tablet TAKE 1 TABLET EVERY DAY  90 tablet  2  . Blood Glucose Monitoring Suppl (CONTOUR BLOOD GLUCOSE SYSTEM) DEVI 1 Device by Does not apply route daily.  1 Device  0   . chlorthalidone (HYGROTON) 25 MG tablet Take 1 tablet (25 mg total) by mouth daily.  30 tablet  5  . citalopram (CELEXA) 20 MG tablet TAKE 1 TABLET BY MOUTH DAILY  30 tablet  5  . EFFIENT 5 MG TABS TAKE 1 TABLET EVERY DAY  30 tablet  11  . fish oil-omega-3 fatty acids 1000 MG capsule Take 3 g by mouth daily.       . furosemide (LASIX) 20 MG tablet Take 20 mg by mouth as needed.      Marland Kitchen glucose blood (BAYER CONTOUR TEST) test strip 1 each by Other route 2 (two) times daily.  100 each  12  . hydrocortisone (ANUSOL-HC) 25 MG suppository USE 1 SUPPOSITORY AT BEDTIME X 7 DAYS THEN AS NEEDED  7 suppository  1  . insulin aspart (NOVOLOG) 100 UNIT/ML injection Inject 44 Units into the skin 4 (four) times daily. Addition 40 units at bedtime      . insulin detemir (LEVEMIR) 100 UNIT/ML injection Inject 60 Units into the skin 2 (two) times daily.        Marland Kitchen LEVEMIR FLEXPEN 100 UNIT/ML injection INJECT 50 UNITS SUBCUTANEOULSY TWICE A DAY AS DIRECTED  3 mL  5  . metFORMIN (GLUCOPHAGE) 1000 MG tablet Take 500 mg by mouth 2 (two) times daily with a meal.       . metoprolol succinate (TOPROL-XL) 25 MG 24 hr tablet TAKE 1 TABLET BY MOUTH EVERY DAY  30 tablet  6  . nitroGLYCERIN (NITROSTAT) 0.4 MG SL tablet Place 0.4 mg under the tongue every 5 (five) minutes as needed.        . nystatin cream (MYCOSTATIN) Apply topically 2 (two) times daily as needed.  30 g  0  . warfarin (COUMADIN) 5 MG tablet TAKE 1 TABLET (5MG ) BY MOUTH DAILY**1 & 1/2 TABLETS ON (7.5 MG) TUESDAYS AND THURSDAYS**  30 tablet  2   No current facility-administered medications on file prior to visit.    BP 134/78  Pulse 98  Temp(Src) 98.7 F (37.1 C) (Oral)  Wt 274 lb (124.286 kg)  BMI 38.23 kg/m2  SpO2 97%chart    Objective:   Physical Exam  Constitutional: He is oriented to person, place, and time. He appears well-developed and well-nourished.  HENT:  Right Ear: External ear normal.  Left Ear: External ear normal.  Nose: Nose normal.   Mouth/Throat: Oropharynx is clear and moist.  Neck: Normal range of motion. Neck supple. No thyromegaly present.  Cardiovascular: Normal rate, regular rhythm and normal heart sounds.   Pulmonary/Chest: Effort normal and breath sounds normal.  Abdominal: Soft. Bowel sounds are normal.  Musculoskeletal: Normal range of motion.  Neurological: He is alert and oriented to person, place, and time.  Skin:  Skin is warm and dry.  Psychiatric: He has a normal mood and affect.          Assessment & Plan:  1. GERD 2.  Assessment:  1. GERD 2. Osteoarthritis 3. Type 2 diabetes 4. Epigastric pain 5. Hypercalcemia 6. High-risk medication usage-Coumadin  Plan: For GERD, Nexium 40 mg once daily. GERD friendly diet. H. pylori sent. Start tramadol 50 mg one to 2 tablets as needed every 8 hours for pain. Recheck calcium today and add on parathyroid hormone. We'll follow with patient in 2 weeks and sooner as needed.  Instructions given for Coumadin.

## 2012-12-04 NOTE — Patient Instructions (Addendum)
Take an extra 1/2 tab today only. Then continue 5mg  a day Except Wednesdays (1 1/2 tab) 7.5mg   Recheck in 2 weeks.    Anticoagulation Dose Instructions as of 12/04/2012     [name] Tue Wed Thu Fri Sat   New Dose 5 mg 5 mg 5 mg 5 mg 5 mg 5 mg 5 mg    Description       Take an extra 1/2 tab today only. Then continue 5mg  a day Except Wednesdays (1 1/2 tab) 7.5mg   Recheck in 2 weeks.           Diet for Gastroesophageal Reflux Disease, Adult Reflux (acid reflux) is when acid from your stomach flows up into the esophagus. When acid comes in contact with the esophagus, the acid causes irritation and soreness (inflammation) in the esophagus. When reflux happens often or so severely that it causes damage to the esophagus, it is called gastroesophageal reflux disease (GERD). Nutrition therapy can help ease the discomfort of GERD. FOODS OR DRINKS TO AVOID OR LIMIT  Smoking or chewing tobacco. Nicotine is one of the most potent stimulants to acid production in the gastrointestinal tract.  Caffeinated and decaffeinated coffee and black tea.  Regular or low-calorie carbonated beverages or energy drinks (caffeine-free carbonated beverages are allowed).   Strong spices, such as black pepper, white pepper, red pepper, cayenne, curry powder, and chili powder.  Peppermint or spearmint.  Chocolate.  High-fat foods, including meats and fried foods. Extra added fats including oils, butter, salad dressings, and nuts. Limit these to less than 8 tsp per day.  Fruits and vegetables if they are not tolerated, such as citrus fruits or tomatoes.  Alcohol.  Any food that seems to aggravate your condition. If you have questions regarding your diet, call your caregiver or a registered dietitian. OTHER THINGS THAT MAY HELP GERD INCLUDE:   Eating your meals slowly, in a relaxed setting.  Eating 5 to 6 small meals per day instead of 3 large meals.  Eliminating food for a period of time if it causes  distress.  Not lying down until 3 hours after eating a meal.  Keeping the head of your bed raised 6 to 9 inches (15 to 23 cm) by using a foam wedge or blocks under the legs of the bed. Lying flat may make symptoms worse.  Being physically active. Weight loss may be helpful in reducing reflux in overweight or obese adults.  Wear loose fitting clothing EXAMPLE MEAL PLAN This meal plan is approximately 2,000 calories based on https://www.[facility].org/ meal planning guidelines. Breakfast   cup cooked oatmeal.  1 cup strawberries.  1 cup low-fat milk.  1 oz almonds. Snack  1 cup cucumber slices.  6 oz yogurt (made from low-fat or fat-free milk). Lunch  2 slice whole-wheat bread.  2 oz sliced [place].  2 tsp mayonnaise.  1 cup blueberries.  1 cup snap peas. Snack  6 whole-wheat crackers.  1 oz string cheese. Dinner   cup brown rice.  1 cup mixed veggies.  1 tsp olive oil.  3 oz grilled fish. Document Released: 09/12/2005 Document Revised: 12/05/2011 Document Reviewed: 07/29/2011 [facility] Patient Information 2013 [place], [place].  Gastroesophageal Reflux Disease, Adult Gastroesophageal reflux disease (GERD) happens when acid from your stomach flows up into the esophagus. When acid comes in contact with the esophagus, the acid causes soreness (inflammation) in the esophagus. Over time, GERD may create small holes (ulcers) in the lining of the esophagus. CAUSES   Increased body  weight. This puts pressure on the stomach, making acid rise from the stomach into the esophagus.  Smoking. This increases acid production in the stomach.  Drinking alcohol. This causes decreased pressure in the lower esophageal sphincter (valve or ring of muscle between the esophagus and stomach), allowing acid from the stomach into the esophagus.  Late evening meals and a full stomach. This increases pressure and acid production in the stomach.  A malformed lower esophageal  sphincter. Sometimes, no cause is found. SYMPTOMS   Burning pain in the lower part of the mid-chest behind the breastbone and in the mid-stomach area. This may occur twice a week or more often.  Trouble swallowing.  Sore throat.  Dry cough.  Asthma-like symptoms including chest tightness, shortness of breath, or wheezing. DIAGNOSIS  Your caregiver may be able to diagnose GERD based on your symptoms. In some cases, X-rays and other tests may be done to check for complications or to check the condition of your stomach and esophagus. TREATMENT  Your caregiver may recommend over-the-counter or prescription medicines to help decrease acid production. Ask your caregiver before starting or adding any new medicines.  HOME CARE INSTRUCTIONS   Change the factors that you can control. Ask your caregiver for guidance concerning weight loss, quitting smoking, and alcohol consumption.  Avoid foods and drinks that make your symptoms worse, such as:  Caffeine or alcoholic drinks.  Chocolate.  Peppermint or mint flavorings.  Garlic and onions.  Spicy foods.  Citrus fruits, such as oranges, lemons, or limes.  Tomato-based foods such as sauce, chili, salsa, and pizza.  Fried and fatty foods.  Avoid lying down for the 3 hours prior to your bedtime or prior to taking a nap.  Eat small, frequent meals instead of large meals.  Wear loose-fitting clothing. Do not wear anything tight around your waist that causes pressure on your stomach.  Raise the head of your bed 6 to 8 inches with wood blocks to help you sleep. Extra pillows will not help.  Only take over-the-counter or prescription medicines for pain, discomfort, or fever as directed by your caregiver.  Do not take aspirin, ibuprofen, or other nonsteroidal anti-inflammatory drugs (NSAIDs). SEEK IMMEDIATE MEDICAL CARE IF:   You have pain in your arms, neck, jaw, teeth, or back.  Your pain increases or changes in intensity or  duration.  You develop nausea, vomiting, or sweating (diaphoresis).  You develop shortness of breath, or you faint.  Your vomit is green, yellow, black, or looks like coffee grounds or blood.  Your stool is red, bloody, or black. These symptoms could be signs of other problems, such as heart disease, gastric bleeding, or esophageal bleeding. MAKE SURE YOU:   Understand these instructions.  Will watch your condition.  Will get help right away if you are not doing well or get worse. Document Released: 06/22/2005 Document Revised: 12/05/2011 Document Reviewed: 04/01/2011 [facility] Patient Information 2013 [place], [place].

## 2012-12-05 ENCOUNTER — Ambulatory Visit (HOSPITAL_COMMUNITY): Payer: BC Managed Care – PPO | Attending: Cardiovascular Disease

## 2012-12-05 DIAGNOSIS — E119 Type 2 diabetes mellitus without complications: Secondary | ICD-10-CM | POA: Insufficient documentation

## 2012-12-05 DIAGNOSIS — E785 Hyperlipidemia, unspecified: Secondary | ICD-10-CM | POA: Insufficient documentation

## 2012-12-05 DIAGNOSIS — I270 Primary pulmonary hypertension: Secondary | ICD-10-CM | POA: Insufficient documentation

## 2012-12-05 DIAGNOSIS — I079 Rheumatic tricuspid valve disease, unspecified: Secondary | ICD-10-CM | POA: Insufficient documentation

## 2012-12-05 DIAGNOSIS — R000 Tachycardia, unspecified: Secondary | ICD-10-CM | POA: Insufficient documentation

## 2012-12-05 LAB — PTH, INTACT AND CALCIUM: Calcium, Total (PTH): 10.1 mg/dL (ref 8.4–10.5)

## 2012-12-05 NOTE — Progress Notes (Signed)
Echocardiogram performed.  

## 2012-12-10 ENCOUNTER — Other Ambulatory Visit: Payer: Self-pay | Admitting: Internal Medicine

## 2012-12-10 ENCOUNTER — Encounter: Payer: BC Managed Care – PPO | Admitting: Family

## 2012-12-10 ENCOUNTER — Telehealth: Payer: Self-pay | Admitting: Cardiology

## 2012-12-10 NOTE — Telephone Encounter (Signed)
New Problem:    Patient called in returning Lauren's call regarding his recent ECHO.  Please call back.

## 2012-12-10 NOTE — Telephone Encounter (Signed)
Results given/ copy to pcp, pt aware and verbalized understanding.

## 2012-12-20 ENCOUNTER — Ambulatory Visit: Payer: BC Managed Care – PPO | Admitting: Cardiology

## 2012-12-24 ENCOUNTER — Encounter: Payer: Self-pay | Admitting: Cardiology

## 2012-12-24 ENCOUNTER — Ambulatory Visit (INDEPENDENT_AMBULATORY_CARE_PROVIDER_SITE_OTHER): Payer: BC Managed Care – PPO | Admitting: Cardiology

## 2012-12-24 ENCOUNTER — Encounter: Payer: BC Managed Care – PPO | Admitting: Cardiology

## 2012-12-24 VITALS — BP 130/80 | HR 93 | Temp 98.1°F | Ht 71.0 in | Wt 274.0 lb

## 2012-12-24 DIAGNOSIS — R5381 Other malaise: Secondary | ICD-10-CM

## 2012-12-24 DIAGNOSIS — R5383 Other fatigue: Secondary | ICD-10-CM | POA: Insufficient documentation

## 2012-12-24 NOTE — Assessment & Plan Note (Signed)
I do not see any cardiac or other serious problem today. I spent a long time with he and his wife and shoulder questions. I think he is depressed but also has multifactorial reasons to be tired and failing to thrive. He is markedly overweight and this is a significant issue.  I've made no changes in his medical program.

## 2012-12-24 NOTE — Progress Notes (Signed)
HPI [name] comes in today thinking he had an appointment with Dr. Riley Kill. He was complaining of increased shortness of breath as well as abdominal distention. He feels his weight is also up about 8 pounds.  He saw Dr. Riley Kill about 4 weeks ago. He had similar complaints at that time. In fact, he says he's felt this way for about 4 months.  Hemoglobin was 12.9. Echocardiogram showed good LV systolic function with only mild increase in right-sided pressures. He has obstructive sleep apnea and a history of pulmonary hypertension.  He saw his primary care physician recently. They thought he may have gastroesophageal reflux. He also took Lasix yesterday and did not feel any better.  He complains of just not feeling well, bilateral knee pain, abdominal distention. He denies any nausea or vomiting postprandial, fever, chills, hemoptysis, and his bowel movements have been normal. He has minimal hemorrhoidal bleeding on anticoagulation which was noted before. He still has his gallbladder.  He chronically sleeps in a recliner and wears his CPAP. Other than one night, he seems to be sleeping pretty well. In fact he thinks he sleeps too much.  Past Medical History  Diagnosis Date  . HYPERLIPIDEMIA 04/12/2007  . HYPERTENSION 04/12/2007  . CORONARY ARTERY DISEASE 04/12/2007  . GERD 04/12/2007  . DIABETES MELLITUS, TYPE II, UNCONTROLLED 06/08/2007  . SLEEP APNEA, OBSTRUCTIVE 01/16/2009  . RENAL ARTERY STENOSIS 01/16/2009  . ESOPHAGEAL STRICTURE 01/16/2009  . PSORIASIS 01/16/2009  . PULMONARY EMBOLISM 01/20/2009  . 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 NEPHROLITHIASIS, HX OF 02/27/2009  . DEPRESSION 03/08/2009  . DYSPNEA 03/22/2010  . ANEMIA-UNSPECIFIED 04/28/2010  . DIABETES MELLITUS-TYPE II 09/01/2010  . Atherosclerosis of renal artery   . Coronary atherosclerosis of unspecified type of vessel, native or graft     s/p DES to RCA x2 in 2005; s/p DES to CFX 2009; s/p DES PTCA of RCA for  in stent restenosis 03/2010;  s/p Promus DES x2 07/16/10(recurrent pain with abnormal Myoview oct 18,2011; residual at cath 07/16/10: LAD 30%; CFX 30%; RCA 30% ISR.  Marland Kitchen Leukopenia 11/11/2011    Current Outpatient Prescriptions  Medication Sig Dispense Refill  . atorvastatin (LIPITOR) 40 MG tablet Take 1 tablet (40 mg total) by mouth daily.  90 tablet  3  . B-D ULTRAFINE III SHORT PEN 31G X 8 MM MISC USE AS DIRECTED  100 each  7  . BENICAR 40 MG tablet TAKE 1 TABLET EVERY DAY  90 tablet  2  . Blood Glucose Monitoring Suppl (CONTOUR BLOOD GLUCOSE SYSTEM) DEVI 1 Device by Does not apply route daily.  1 Device  0  . chlorthalidone (HYGROTON) 25 MG tablet Take 1 tablet (25 mg total) by mouth daily.  30 tablet  5  . citalopram (CELEXA) 20 MG tablet TAKE 1 TABLET BY MOUTH DAILY  30 tablet  5  . EFFIENT 5 MG TABS TAKE 1 TABLET EVERY DAY  30 tablet  11  . esomeprazole (NEXIUM) 40 MG capsule Take 1 capsule (40 mg total) by mouth daily.  30 capsule  3  . fish oil-omega-3 fatty acids 1000 MG capsule Take 3 g by mouth daily.       . furosemide (LASIX) 20 MG tablet Take 20 mg by mouth as needed.      Marland Kitchen glucose blood (BAYER CONTOUR TEST) test strip 1 each by Other route 2 (two) times daily.  100 each  12  . hydrocortisone (ANUSOL-HC) 25 MG suppository USE 1 SUPPOSITORY AT BEDTIME X 7  DAYS THEN AS NEEDED  7 suppository  1  . insulin aspart (NOVOLOG) 100 UNIT/ML injection Inject 44 Units into the skin 4 (four) times daily. Addition 40 units at bedtime      . insulin detemir (LEVEMIR) 100 UNIT/ML injection Inject 60 Units into the skin 2 (two) times daily.        Marland Kitchen LEVEMIR FLEXPEN 100 UNIT/ML injection INJECT 50 UNITS SUBCUTANEOULSY TWICE A DAY AS DIRECTED  3 mL  5  . metFORMIN (GLUCOPHAGE) 1000 MG tablet Take 500 mg by mouth 2 (two) times daily with a meal.       . metoprolol succinate (TOPROL-XL) 25 MG 24 hr tablet TAKE 1 TABLET BY MOUTH EVERY DAY  30 tablet  6  . nitroGLYCERIN (NITROSTAT) 0.4 MG SL tablet Place 0.4 mg under the tongue  every 5 (five) minutes as needed.        . nystatin cream (MYCOSTATIN) Apply topically 2 (two) times daily as needed.  30 g  0  . traMADol (ULTRAM) 50 MG tablet Take 1-2 tablets (50-100 mg total) by mouth every 8 (eight) hours as needed for pain.  60 tablet  3  . warfarin (COUMADIN) 5 MG tablet TAKE 1 TABLET (5MG ) BY MOUTH DAILY**1 & 1/2 TABLETS ON (7.5 MG) TUESDAYS AND THURSDAYS**  30 tablet  2   No current facility-administered medications for this visit.    Allergies  Allergen Reactions  . Piroxicam     PHELDENE REACTION: unspecified: blistering on hands  . Rofecoxib     REACTION: Reaction not known    Family History  Problem Relation Age of Onset  . Pulmonary embolism Mother   . Factor V Leiden deficiency Mother   . Diabetes Brother   . Heart failure Brother   . Hypertension Brother     History   Social History  . Marital Status: Married    Spouse Name: N/A    Number of Children: N/A  . Years of Education: N/A   Occupational History  . Not on file.   Social History Main Topics  . Smoking status: Never Smoker   . Smokeless tobacco: Never Used  . Alcohol Use: No  . Drug Use: No  . Sexually Active: Not on file   Other Topics Concern  . Not on file   Social History Narrative  . No narrative on file    ROS ALL NEGATIVE EXCEPT THOSE NOTED IN HPI  PE  General Appearance: well developed, well nourished in no acute distress, morbidly obese, looks depressed HEENT: symmetrical face, PERRLA, good dentition  Neck: no JVD, thyromegaly, or adenopathy, trachea midline Chest: symmetric without deformity Cardiac: PMI POORLY APPRECIATED, RRR, normal S1, S2, no gallop or murmur Lung: clear to ausculation and percussion Vascular: all pulses full without bruits  Abdominal: Distended but not tight, diffuse tenderness, no true Murphy's sign, good bowel sounds, no HSM, Extremities: no cyanosis, clubbing, 1-2+ pitting edema bilaterally, no sign of DVT, no varicosities  Skin:  normal color, no rashes Neuro: alert and oriented x 3, non-focal Pysch: normal affect,  EKG Normal sinus rhythm, left axis deviation, no change from recent EKG.  BMET    Component Value Date/Time   NA 135 12/04/2012 1430   K 4.0 12/04/2012 1430   CL 101 12/04/2012 1430   CO2 24 12/04/2012 1430   GLUCOSE 309* 12/04/2012 1430   GLUCOSE 103* 08/02/2006 1129   BUN 25* 12/04/2012 1430   CREATININE 1.1 12/04/2012 1430   CALCIUM 10.1 12/04/2012  1433   CALCIUM 9.9 12/04/2012 1430   GFRNONAA 81* 07/25/2011 1904   GFRAA >90 07/25/2011 1904    Lipid Panel     Component Value Date/Time   CHOL 163 12/04/2012 1430   TRIG 559.0 Triglyceride is over 400; calculations on Lipids are invalid.* 12/04/2012 1430   HDL 26.90* 12/04/2012 1430   CHOLHDL 6 12/04/2012 1430   VLDL 111.8* 12/04/2012 1430   LDLCALC  Value: UNABLE TO CALCULATE IF TRIGLYCERIDE OVER 400 mg/dL        Total Cholesterol/HDL:CHD Risk Coronary Heart Disease Risk Table                     Men   Women  1/2 Average Risk   3.4   3.3  Average Risk       5.0   4.4  2 X Average Risk   9.6   7.1  3 X Average Risk  23.4   11.0        Use the calculated Patient Ratio above and the CHD Risk Table to determine the patient's CHD Risk.        ATP III CLASSIFICATION (LDL):  <100     mg/dL   Optimal  478-295  mg/dL   Near or Above                    Optimal  130-159  mg/dL   Borderline  621-308  mg/dL   High  >657     mg/dL   Very High 8/46/9629 5284    CBC    Component Value Date/Time   WBC 4.1* 11/27/2012 1312   WBC 4.0 08/23/2010 1206   RBC 4.48 11/27/2012 1312   RBC 4.59 08/23/2010 1206   HGB 12.9* 11/27/2012 1312   HGB 12.7* 08/23/2010 1206   HCT 38.1* 11/27/2012 1312   HCT 37.5* 08/23/2010 1206   PLT 261.0 11/27/2012 1312   PLT 196 08/23/2010 1206   MCV 85.0 11/27/2012 1312   MCV 81.7 08/23/2010 1206   MCH 28.0 07/25/2011 1904   MCH 27.7 08/23/2010 1206   MCHC 33.8 11/27/2012 1312   MCHC 33.9 08/23/2010 1206   RDW 14.5 11/27/2012 1312   RDW 14.5 08/23/2010  1206   LYMPHSABS 0.9 11/27/2012 1312   LYMPHSABS 0.9 08/23/2010 1206   MONOABS 0.0* 11/27/2012 1312   MONOABS 0.1 08/23/2010 1206   EOSABS 0.2 11/27/2012 1312   EOSABS 0.2 08/23/2010 1206   BASOSABS 0.0 11/27/2012 1312   BASOSABS 0.1 08/23/2010 1206

## 2012-12-25 ENCOUNTER — Ambulatory Visit: Payer: BC Managed Care – PPO | Admitting: Cardiology

## 2012-12-25 ENCOUNTER — Ambulatory Visit (INDEPENDENT_AMBULATORY_CARE_PROVIDER_SITE_OTHER): Payer: BC Managed Care – PPO | Admitting: Family

## 2012-12-25 DIAGNOSIS — I2699 Other pulmonary embolism without acute cor pulmonale: Secondary | ICD-10-CM

## 2012-12-25 LAB — POCT INR: INR: 2

## 2012-12-25 NOTE — Telephone Encounter (Signed)
New problem    Pt was seen on yesterday by Dr. Daleen Squibb and appt for  4/2 was cancel. Patient is asking to be seen before Dr. Riley Kill retired.

## 2012-12-25 NOTE — Telephone Encounter (Signed)
I spoke with the pt's wife and scheduled the pt to see Dr Riley Kill on 01/09/13.

## 2012-12-25 NOTE — Patient Instructions (Addendum)
Then continue 5mg  a day Except Wednesdays (1 1/2 tab) 7.5mg   Recheck in 3 weeks.   Anticoagulation Dose Instructions as of 12/25/2012     [name] Tue Wed Thu Fri Sat   New Dose 5 mg 5 mg 5 mg 5 mg 5 mg 5 mg 5 mg    Description       Then continue 5mg  a day Except Wednesdays (1 1/2 tab) 7.5mg   Recheck in 3 weeks.

## 2012-12-26 ENCOUNTER — Emergency Department (HOSPITAL_COMMUNITY)
Admission: EM | Admit: 2012-12-26 | Discharge: 2012-12-26 | Disposition: A | Discharge status: Home / Self Care | Payer: BC Managed Care – PPO | Attending: Emergency Medicine | Admitting: Emergency Medicine

## 2012-12-26 ENCOUNTER — Emergency Department (HOSPITAL_COMMUNITY): Payer: BC Managed Care – PPO

## 2012-12-26 ENCOUNTER — Encounter: Payer: Self-pay | Admitting: Internal Medicine

## 2012-12-26 ENCOUNTER — Encounter (HOSPITAL_COMMUNITY): Payer: Self-pay | Admitting: Emergency Medicine

## 2012-12-26 ENCOUNTER — Ambulatory Visit: Payer: BC Managed Care – PPO | Admitting: Cardiology

## 2012-12-26 DIAGNOSIS — Z862 Personal history of diseases of the blood and blood-forming organs and certain disorders involving the immune mechanism: Secondary | ICD-10-CM | POA: Insufficient documentation

## 2012-12-26 DIAGNOSIS — F329 Major depressive disorder, single episode, unspecified: Secondary | ICD-10-CM | POA: Insufficient documentation

## 2012-12-26 DIAGNOSIS — Z7901 Long term (current) use of anticoagulants: Secondary | ICD-10-CM | POA: Insufficient documentation

## 2012-12-26 DIAGNOSIS — D682 Hereditary deficiency of other clotting factors: Secondary | ICD-10-CM | POA: Insufficient documentation

## 2012-12-26 DIAGNOSIS — Y939 Activity, unspecified: Secondary | ICD-10-CM | POA: Insufficient documentation

## 2012-12-26 DIAGNOSIS — Y929 Unspecified place or not applicable: Secondary | ICD-10-CM | POA: Insufficient documentation

## 2012-12-26 DIAGNOSIS — E785 Hyperlipidemia, unspecified: Secondary | ICD-10-CM | POA: Insufficient documentation

## 2012-12-26 DIAGNOSIS — Z86711 Personal history of pulmonary embolism: Secondary | ICD-10-CM | POA: Insufficient documentation

## 2012-12-26 DIAGNOSIS — Z794 Long term (current) use of insulin: Secondary | ICD-10-CM | POA: Insufficient documentation

## 2012-12-26 DIAGNOSIS — G4733 Obstructive sleep apnea (adult) (pediatric): Secondary | ICD-10-CM | POA: Insufficient documentation

## 2012-12-26 DIAGNOSIS — L089 Local infection of the skin and subcutaneous tissue, unspecified: Secondary | ICD-10-CM | POA: Insufficient documentation

## 2012-12-26 DIAGNOSIS — Z9861 Coronary angioplasty status: Secondary | ICD-10-CM | POA: Insufficient documentation

## 2012-12-26 DIAGNOSIS — I2510 Atherosclerotic heart disease of native coronary artery without angina pectoris: Secondary | ICD-10-CM | POA: Insufficient documentation

## 2012-12-26 DIAGNOSIS — X58XXXA Exposure to other specified factors, initial encounter: Secondary | ICD-10-CM | POA: Insufficient documentation

## 2012-12-26 DIAGNOSIS — I2699 Other pulmonary embolism without acute cor pulmonale: Secondary | ICD-10-CM

## 2012-12-26 DIAGNOSIS — Z8679 Personal history of other diseases of the circulatory system: Secondary | ICD-10-CM | POA: Insufficient documentation

## 2012-12-26 DIAGNOSIS — Z87442 Personal history of urinary calculi: Secondary | ICD-10-CM | POA: Insufficient documentation

## 2012-12-26 DIAGNOSIS — E119 Type 2 diabetes mellitus without complications: Secondary | ICD-10-CM | POA: Insufficient documentation

## 2012-12-26 DIAGNOSIS — R791 Abnormal coagulation profile: Secondary | ICD-10-CM | POA: Insufficient documentation

## 2012-12-26 DIAGNOSIS — K219 Gastro-esophageal reflux disease without esophagitis: Secondary | ICD-10-CM | POA: Insufficient documentation

## 2012-12-26 DIAGNOSIS — I10 Essential (primary) hypertension: Secondary | ICD-10-CM | POA: Insufficient documentation

## 2012-12-26 DIAGNOSIS — M1710 Unilateral primary osteoarthritis, unspecified knee: Secondary | ICD-10-CM

## 2012-12-26 DIAGNOSIS — Z872 Personal history of diseases of the skin and subcutaneous tissue: Secondary | ICD-10-CM | POA: Insufficient documentation

## 2012-12-26 DIAGNOSIS — F3289 Other specified depressive episodes: Secondary | ICD-10-CM | POA: Insufficient documentation

## 2012-12-26 DIAGNOSIS — Z79899 Other long term (current) drug therapy: Secondary | ICD-10-CM | POA: Insufficient documentation

## 2012-12-26 DIAGNOSIS — Z8719 Personal history of other diseases of the digestive system: Secondary | ICD-10-CM | POA: Insufficient documentation

## 2012-12-26 LAB — CBC WITH DIFFERENTIAL/PLATELET
Basophils Absolute: 0 10*3/uL (ref 0.0–0.1)
Basophils Relative: 1 % (ref 0–1)
Lymphocytes Relative: 9 % — ABNORMAL LOW (ref 12–46)
MCHC: 33.6 g/dL (ref 30.0–36.0)
Neutro Abs: 5 10*3/uL (ref 1.7–7.7)
Neutrophils Relative %: 85 % — ABNORMAL HIGH (ref 43–77)
Platelets: 176 10*3/uL (ref 150–400)
RDW: 15.5 % (ref 11.5–15.5)
WBC: 5.9 10*3/uL (ref 4.0–10.5)

## 2012-12-26 LAB — PROTIME-INR
INR: 1.92 — ABNORMAL HIGH (ref 0.00–1.49)
Prothrombin Time: 21.2 seconds — ABNORMAL HIGH (ref 11.6–15.2)

## 2012-12-26 LAB — POCT I-STAT, CHEM 8
Calcium, Ion: 1.29 mmol/L — ABNORMAL HIGH (ref 1.12–1.23)
Chloride: 104 mEq/L (ref 96–112)
Glucose, Bld: 257 mg/dL — ABNORMAL HIGH (ref 70–99)
HCT: 37 % — ABNORMAL LOW (ref 39.0–52.0)
Hemoglobin: 12.6 g/dL — ABNORMAL LOW (ref 13.0–17.0)
TCO2: 23 mmol/L (ref 0–100)

## 2012-12-26 MED ORDER — CLINDAMYCIN HCL 150 MG PO CAPS: 150.0000 mg | ORAL_CAPSULE | Freq: Four times a day (QID) | ORAL | Status: DC

## 2012-12-26 MED ORDER — CLINDAMYCIN PHOSPHATE 300 MG/50ML IV SOLN
300.0000 mg | Freq: Once | INTRAVENOUS | Status: AC
  Administered 2012-12-26: 300 mg via INTRAVENOUS
  Filled 2012-12-26: qty 50

## 2012-12-26 MED ORDER — IOHEXOL 350 MG/ML SOLN
100.0000 mL | Freq: Once | INTRAVENOUS | Status: AC | PRN
  Administered 2012-12-26: 100 mL via INTRAVENOUS

## 2012-12-26 MED ORDER — CLINDAMYCIN PHOSPHATE 600 MG/50ML IV SOLN: 600.0000 mg | Freq: Once | INTRAVENOUS | Status: DC

## 2012-12-26 MED ORDER — HYDROMORPHONE HCL PF 1 MG/ML IJ SOLN
1.0000 mg | Freq: Once | INTRAMUSCULAR | Status: AC
  Administered 2012-12-26: 1 mg via INTRAVENOUS
  Filled 2012-12-26: qty 1

## 2012-12-26 MED ORDER — TRAMADOL HCL 50 MG PO TABS: 50.0000 mg | ORAL_TABLET | Freq: Four times a day (QID) | ORAL | Status: DC | PRN

## 2012-12-26 MED ORDER — ONDANSETRON HCL 4 MG/2ML IJ SOLN
4.0000 mg | Freq: Once | INTRAMUSCULAR | Status: AC
  Administered 2012-12-26: 4 mg via INTRAVENOUS
  Filled 2012-12-26: qty 2

## 2012-12-26 NOTE — ED Provider Notes (Signed)
History     CSN: [id number]  Arrival date & time 12/26/12  4098   First MD Initiated Contact with Patient 12/26/12 0703      No chief complaint on file.   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  Patient reports for the past 3-4 weeks he has been having increased shortness of breath. Shortness of breath is sometimes exertional. 2 days ago he notice pain to his right pinky. Pain is getting progressively worse. Describe pain as a sharp throbbing sensation, 10/10, persistent, worsening with movement or with palpation. He noticed that his skin in the affected area is bruising and blistering to with some clear discharge. He is unsure if it was a spider bite. He has tried OTC soaks to alleviate sxs without relief.  Felt feverish yesterday and took some tylenol.  Patient currently on warfarin last INR check was yesterday and it was 2.0. Otherwise patient denies headache, neck stiffness, chest pain, numbness or weakness. He denies any trauma to his right pinky finger. He did not see any spider that can be responsible for the bite.   Past Medical History  Diagnosis Date  . HYPERLIPIDEMIA 04/12/2007  . HYPERTENSION 04/12/2007  . CORONARY ARTERY DISEASE 04/12/2007  . GERD 04/12/2007  . DIABETES MELLITUS, TYPE II, UNCONTROLLED 06/08/2007  . SLEEP APNEA, OBSTRUCTIVE 01/16/2009  . RENAL ARTERY STENOSIS 01/16/2009  . ESOPHAGEAL STRICTURE 01/16/2009  . PSORIASIS 01/16/2009  . PULMONARY EMBOLISM 01/20/2009  . 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 NEPHROLITHIASIS, HX OF 02/27/2009  . DEPRESSION 03/08/2009  . DYSPNEA 03/22/2010  . ANEMIA-UNSPECIFIED 04/28/2010  . DIABETES MELLITUS-TYPE II 09/01/2010  . Atherosclerosis of renal artery   .  Coronary atherosclerosis of unspecified type of vessel, native or graft     s/p DES to RCA x2 in 2005; s/p DES to CFX 2009; s/p DES PTCA of RCA for  in stent restenosis 03/2010; s/p Promus DES x2 07/16/10(recurrent pain with abnormal Myoview oct 18,2011; residual at cath 07/16/10: LAD 30%; CFX 30%; RCA 30% ISR.  Marland Kitchen Leukopenia 11/11/2011    Past Surgical History  Procedure Laterality Date  . Knee arthroscopy  2007  . Rca stent      x2  . Angioplasty  2005 & 2011  . Coronary angioplasty with stent placement    . Esophagogastroduodenoscopy    . Colonoscopy    . Carpal tunnel release  2013    nudelman    Family History  Problem Relation Age of Onset  . Pulmonary embolism Mother   . Factor V Leiden deficiency Mother   . Diabetes Brother   . Heart failure Brother   . Hypertension Brother     History  Substance Use Topics  . Smoking status: Never Smoker   . Smokeless tobacco: Never Used  . Alcohol Use: No      Review of Systems  Constitutional:       A complete 10 system review of systems was obtained and all systems are negative except as noted in the HPI and PMH.    Allergies  Piroxicam and Rofecoxib  Home Medications   Current Outpatient Rx  Name  Route  Sig  Dispense  Refill  . atorvastatin (LIPITOR) 40 MG tablet   Oral   Take 1 tablet (40 mg total) by mouth daily.  90 tablet   3   . B-D ULTRAFINE III SHORT PEN 31G X 8 MM MISC      USE AS DIRECTED   100 each   7   . BENICAR 40 MG tablet      TAKE 1 TABLET EVERY DAY   90 tablet   2   . Blood Glucose Monitoring Suppl (CONTOUR BLOOD GLUCOSE SYSTEM) DEVI   Does not apply   1 Device by Does not apply route daily.   1 Device   0   . chlorthalidone (HYGROTON) 25 MG tablet   Oral   Take 1 tablet (25 mg total) by mouth daily.   30 tablet   5   . citalopram (CELEXA) 20 MG tablet      TAKE 1 TABLET BY MOUTH DAILY   30 tablet   5   . EFFIENT 5 MG TABS      TAKE 1 TABLET EVERY DAY   30 tablet    11   . esomeprazole (NEXIUM) 40 MG capsule   Oral   Take 1 capsule (40 mg total) by mouth daily.   30 capsule   3   . fish oil-omega-3 fatty acids 1000 MG capsule   Oral   Take 3 g by mouth daily.          . furosemide (LASIX) 20 MG tablet   Oral   Take 20 mg by mouth as needed.         Marland Kitchen glucose blood (BAYER CONTOUR TEST) test strip   Other   1 each by Other route 2 (two) times daily.   100 each   12   . hydrocortisone (ANUSOL-HC) 25 MG suppository      USE 1 SUPPOSITORY AT BEDTIME X 7 DAYS THEN AS NEEDED   7 suppository   1   . insulin aspart (NOVOLOG) 100 UNIT/ML injection   Subcutaneous   Inject 44 Units into the skin 4 (four) times daily. Addition 40 units at bedtime         . insulin detemir (LEVEMIR) 100 UNIT/ML injection   Subcutaneous   Inject 60 Units into the skin 2 (two) times daily.           Marland Kitchen LEVEMIR FLEXPEN 100 UNIT/ML injection      INJECT 50 UNITS SUBCUTANEOULSY TWICE A DAY AS DIRECTED   3 mL   5   . metFORMIN (GLUCOPHAGE) 1000 MG tablet   Oral   Take 500 mg by mouth 2 (two) times daily with a meal.          . metoprolol succinate (TOPROL-XL) 25 MG 24 hr tablet      TAKE 1 TABLET BY MOUTH EVERY DAY   30 tablet   6   . nitroGLYCERIN (NITROSTAT) 0.4 MG SL tablet   Sublingual   Place 0.4 mg under the tongue every 5 (five) minutes as needed.           . nystatin cream (MYCOSTATIN)   Topical   Apply topically 2 (two) times daily as needed.   30 g   0   . traMADol (ULTRAM) 50 MG tablet   Oral   Take 1-2 tablets (50-100 mg total) by mouth every 8 (eight) hours as needed for pain.   60 tablet   3   . warfarin (COUMADIN) 5 MG tablet      TAKE 1 TABLET (5MG ) BY MOUTH DAILY**1 & 1/2 TABLETS ON (7.5 MG) TUESDAYS AND THURSDAYS**  30 tablet   2     BP 115/73  Pulse 112  Temp(Src) 98 F (36.7 C) (Oral)  Resp 18  SpO2 96%  Physical Exam  Nursing note and vitals reviewed. Constitutional: He is oriented to person,  place, and time. He appears well-developed and well-nourished. No distress.  HENT:  Head: Atraumatic.  Mouth/Throat: Oropharynx is clear and moist.  Eyes: Conjunctivae and EOM are normal. Pupils are equal, round, and reactive to light.  Neck: Neck supple. No JVD present.  Cardiovascular: Intact distal pulses.   Tachycardia noted without murmurs, rubs, or gallops.  Pulmonary/Chest: Effort normal and breath sounds normal. No respiratory distress. He has no wheezes.  Abdominal: Soft.  Musculoskeletal: He exhibits tenderness (R pinky finger: a 1cm area of ecchymosis noted to distal tip of finger with a small blister, oozing serous sanguionous discharge, and there are surrounding erythema extending towards DIP.  no joint involvement, no nail bed involvement.  ttp, not a paronych). He exhibits no edema.  Neurological: He is alert and oriented to person, place, and time.  Skin: Skin is warm.    ED Course  NERVE BLOCK Date/Time: 12/26/2012 8:26 AM Performed by: Fayrene Helper Authorized by: Fayrene Helper Consent: Verbal consent obtained. Risks and benefits: risks, benefits and alternatives were discussed Consent given by: patient Patient understanding: patient states understanding of the procedure being performed Patient consent: the patient's understanding of the procedure matches consent given Relevant documents: relevant documents present and verified Test results: test results available and properly labeled Site marked: the operative site was marked Patient identity confirmed: verbally with patient and arm band Time out: Immediately prior to procedure a "time out" was called to verify the correct patient, procedure, equipment, support staff and site/side marked as required. Indications: pain relief Nerve block body site: right pinky finger, tip. Laterality: right Patient sedated: no Preparation: Patient was prepped and draped in the usual sterile fashion. Patient position: sitting Needle  gauge: 27 G Location technique: anatomical landmarks Local anesthetic: lidocaine 2% without epinephrine Anesthetic total: 4 ml Outcome: pain improved Patient tolerance: Patient tolerated the procedure well with no immediate complications.   (including critical care time)    Pt with pain to R pinky finger with evidence of overlying skin infection.  Likely an infected ecchymosis.  Doubt emboli as discussed with my attending.  Plan to incise infected site, obtain culture, treat pain, and have pt to f/u with PCP for further management.  I performed digital block for comfort, and also obtain wound culture from R pinky.   8:24 AM Pt request for chest CTA as he has been having increase DOE x 2-3 weeks and is overly concerning of PE.  Sts his brother died of PE and therefore he wants to be safe.  Will check renal infection and obtain chest CTA.    10:34 AM Patient sign os 1.92 mildly subtherapeutic. I recommend patient to discuss this with his Dr. at bedtime CT angio chest was obtained that shows no evidence of acute finding and no evidence of PE or infection. The result were also discuss with patient. Will treat patient for suspect skin infection of his right pinky finger with clindamycin and pain medication. Return precautions discussed.  10:44 AM Pt reports his pain has returned and request iv abx.  Clindamycin 300mg  IV and pain meds given.  Will be stable for discharge once pt receive abx.  Do not think clindamycin will interact with warfarin.  However, pt is recommended to be closely monitor  for his INR while on abx.    Labs Reviewed  CBC WITH DIFFERENTIAL - Abnormal; Notable for the following:    Hemoglobin 11.9 (*)    HCT 35.4 (*)    Neutrophils Relative 85 (*)    Lymphocytes Relative 9 (*)    Lymphs Abs 0.5 (*)    Monocytes Relative 1 (*)    Monocytes Absolute 0.0 (*)    Eosinophils Relative 6 (*)    All other components within normal limits  PROTIME-INR - Abnormal; Notable for the  following:    Prothrombin Time 21.2 (*)    INR 1.92 (*)    All other components within normal limits  POCT I-STAT, CHEM 8 - Abnormal; Notable for the following:    Glucose, Bld 257 (*)    Calcium, Ion 1.29 (*)    Hemoglobin 12.6 (*)    HCT 37.0 (*)    All other components within normal limits  WOUND CULTURE   Ct Angio Chest Pe W/cm &/or Wo Cm  12/26/2012  *RADIOLOGY REPORT*  Clinical Data: Increasing shortness of breath  CT ANGIOGRAPHY CHEST  Technique:  Multidetector CT imaging of the chest using the standard protocol during bolus administration of intravenous contrast. Multiplanar reconstructed images including MIPs were obtained and reviewed to evaluate the vascular anatomy.  Contrast: OMNIPAQUE IOHEXOL 350 MG/ML SOLN  Comparison: 07/25/2011  Findings: Image quality degraded by patient breathing motion. Within this limitation, no filling defect is identified within the opacified pulmonary arteries to suggest the presence of an acute pulmonary embolus.  No thoracic aortic aneurysm.  There is no dissection of the thoracic aorta.  Heart size is normal.  No pericardial or pleural effusion. Coronary artery calcification is noted.  No focal airspace consolidation.  No pulmonary edema.  No evidence for parenchymal mass lesion.  Bone windows reveal no worrisome lytic or sclerotic osseous lesions.  IMPRESSION: No CT evidence for acute pulmonary embolus.  Lung parenchyma degraded by motion artifact, but no airspace consolidation is evident.   Original Report Authenticated By: Kennith Center, M.D.      1. Infected blister of right pinky finger   MDM  BP 115/73  Pulse 112  Temp(Src) 98 F (36.7 C) (Oral)  Resp 18  Ht 6' (1.829 m)  Wt 260 lb (117.935 kg)  BMI 35.25 kg/m2  SpO2 96%  I have reviewed nursing notes and vital signs. I personally reviewed the imaging tests through PACS system  I reviewed available ER/hospitalization records thought the EMR         Fayrene Helper,  [place] 12/26/12 1156

## 2012-12-26 NOTE — ED Notes (Signed)
PA-C at bedside 

## 2012-12-26 NOTE — ED Notes (Signed)
Pt with c/o pain to right 5th finger. Pt reports "I think its a spider bite". Finger swollen at distal end with round black area with open pink wound bed. Reports pain at 10:10

## 2012-12-26 NOTE — ED Notes (Signed)
NAD noted at time of d/c home 

## 2012-12-26 NOTE — ED Notes (Signed)
Notified RN and PA, patient refused ECG.

## 2012-12-26 NOTE — ED Notes (Signed)
VS obtained prior to d/c home

## 2012-12-26 NOTE — ED Provider Notes (Signed)
Medical screening examination/treatment/procedure(s) were conducted as a shared visit with non-physician practitioner(s) and myself.  I personally evaluated the patient during the encounter 62 yo man with painful hemorrhagic blister on right little finger on the distal phalanx, surrrounded by erythema. No lymphangitis. Pt has a history of Factor V Leiden deficiency and has had pulmonary emboli in the past. He is on coumadin and had a therapeutic INR on testing yesterday. He had a low grade fever last night. This seems like an infected blister. Advised lab workup, pain medicine, and antibiotic treatment.   Carleene Cooper III, MD 12/26/12 2125

## 2012-12-26 NOTE — Progress Notes (Signed)
62 yo man with painful hemorrhagic blister on right little finger on the distal phalanx, surrrounded by erythema.  No lymphangitis.  Pt has a history of Factor V Leiden deficiency and has had pulmonary emboli in the past.  He is on coumadin and had a therapeutic INR on testing yesterday.  He had a low grade fever last night.  This seems like an infected blister.  Advised lab workup, pain medicine, and antibiotic treatment.

## 2012-12-28 ENCOUNTER — Ambulatory Visit (INDEPENDENT_AMBULATORY_CARE_PROVIDER_SITE_OTHER): Payer: BC Managed Care – PPO | Admitting: Internal Medicine

## 2012-12-28 ENCOUNTER — Encounter: Payer: Self-pay | Admitting: Internal Medicine

## 2012-12-28 ENCOUNTER — Encounter (INDEPENDENT_AMBULATORY_CARE_PROVIDER_SITE_OTHER): Payer: BC Managed Care – PPO | Admitting: Family Medicine

## 2012-12-28 VITALS — BP 104/60 | HR 68 | Temp 98.0°F | Wt 278.0 lb

## 2012-12-28 DIAGNOSIS — T887XXA Unspecified adverse effect of drug or medicament, initial encounter: Secondary | ICD-10-CM

## 2012-12-28 DIAGNOSIS — T50905A Adverse effect of unspecified drugs, medicaments and biological substances, initial encounter: Secondary | ICD-10-CM

## 2012-12-28 LAB — WOUND CULTURE

## 2012-12-28 MED ORDER — CLOTRIMAZOLE 10 MG MT TROC: 10.0000 mg | Freq: Every day | OROMUCOSAL | Status: AC

## 2012-12-28 MED ORDER — AMOXICILLIN-POT CLAVULANATE 875-125 MG PO TABS: 1.0000 | ORAL_TABLET | Freq: Two times a day (BID) | ORAL | Status: AC

## 2012-12-29 ENCOUNTER — Telehealth (HOSPITAL_COMMUNITY): Payer: Self-pay | Admitting: Emergency Medicine

## 2012-12-29 NOTE — ED Notes (Addendum)
Patient has +Wound culture. Checking to see if appropriately treated. °

## 2012-12-29 NOTE — ED Notes (Signed)
+  Wound. Chart sent to EDP office for review. 

## 2012-12-30 ENCOUNTER — Telehealth (HOSPITAL_COMMUNITY): Payer: Self-pay | Admitting: Emergency Medicine

## 2012-12-30 NOTE — ED Notes (Signed)
Chart returned from EDP office. Per Arthor Captain PA-C, treatment okay in ED.

## 2012-12-30 NOTE — Progress Notes (Signed)
Changed to PCP schedule. This encounter was created in error - please disregard.

## 2012-12-30 NOTE — Progress Notes (Signed)
Reviewed rece nt notes Note finger infections Currently treated with clindamycin Now has sore mouth, red and blisters  Reviewed pmh, psh , shx  Ros: fatigue, no other complaints  Exam: ringer with erythema and swelling Tongue and buccal mucosa with erythema  A/P- presumptive reaction to clindamycin Change abx to augmentin Note reisk of mrsa Pt understands to call me for any concerns

## 2013-01-02 ENCOUNTER — Ambulatory Visit: Payer: BC Managed Care – PPO | Admitting: Family

## 2013-01-07 ENCOUNTER — Telehealth: Payer: Self-pay | Admitting: *Deleted

## 2013-01-07 MED ORDER — METOPROLOL SUCCINATE ER 25 MG PO TB24: ORAL_TABLET | ORAL | Status: DC

## 2013-01-07 NOTE — Telephone Encounter (Signed)
[facility] calling to refill patient's Metoprolol XL 25 once daily by mouth, @#30, 6 RF    [employer], CMA

## 2013-01-09 ENCOUNTER — Encounter: Payer: Self-pay | Admitting: Cardiology

## 2013-01-09 ENCOUNTER — Ambulatory Visit (INDEPENDENT_AMBULATORY_CARE_PROVIDER_SITE_OTHER): Payer: BC Managed Care – PPO | Admitting: Cardiology

## 2013-01-09 VITALS — BP 112/74 | HR 98 | Ht 71.0 in | Wt 271.0 lb

## 2013-01-09 DIAGNOSIS — E785 Hyperlipidemia, unspecified: Secondary | ICD-10-CM

## 2013-01-09 DIAGNOSIS — I2510 Atherosclerotic heart disease of native coronary artery without angina pectoris: Secondary | ICD-10-CM

## 2013-01-09 DIAGNOSIS — D682 Hereditary deficiency of other clotting factors: Secondary | ICD-10-CM

## 2013-01-09 NOTE — Progress Notes (Signed)
HPI:  Patient is in for followup. He had a difficult time. He subsequently saw Dr. Daleen Squibb, and then was seen in the ER for an insect bite on the right small finger.Marland Kitchen He subsequently went to the emergency room, was placed on antibiotics. He developed a problem with clindamycin, and had to be treated with ampicillin.  A CT angio was done, which did not reveal PE.  He is doing much better at this point in time.  He had fu with Dr. Cato Mulligan.    Current Outpatient Prescriptions  Medication Sig Dispense Refill  . atorvastatin (LIPITOR) 40 MG tablet Take 1 tablet (40 mg total) by mouth daily.  90 tablet  3  . B-D ULTRAFINE III SHORT PEN 31G X 8 MM MISC USE AS DIRECTED  100 each  7  . Blood Glucose Monitoring Suppl (CONTOUR BLOOD GLUCOSE SYSTEM) DEVI 1 Device by Does not apply route daily.  1 Device  0  . chlorthalidone (HYGROTON) 25 MG tablet Take 1 tablet (25 mg total) by mouth daily.  30 tablet  5  . citalopram (CELEXA) 20 MG tablet TAKE 1 TABLET BY MOUTH DAILY  30 tablet  5  . EFFIENT 5 MG TABS TAKE 1 TABLET EVERY DAY  30 tablet  11  . esomeprazole (NEXIUM) 40 MG capsule Take 1 capsule (40 mg total) by mouth daily.  30 capsule  3  . fish oil-omega-3 fatty acids 1000 MG capsule Take 3 g by mouth daily.       . furosemide (LASIX) 20 MG tablet Take 20 mg by mouth as needed.      Marland Kitchen glucose blood (BAYER CONTOUR TEST) test strip 1 each by Other route 2 (two) times daily.  100 each  12  . insulin aspart (NOVOLOG) 100 UNIT/ML injection Inject 44 Units into the skin 4 (four) times daily.       . insulin detemir (LEVEMIR) 100 UNIT/ML injection Inject 60 Units into the skin 2 (two) times daily.        . metFORMIN (GLUCOPHAGE) 500 MG tablet Take 500 mg by mouth 2 (two) times daily with a meal.      . metoprolol succinate (TOPROL-XL) 25 MG 24 hr tablet TAKE 1 TABLET BY MOUTH EVERY DAY  30 tablet  6  . nitroGLYCERIN (NITROSTAT) 0.4 MG SL tablet Place 0.4 mg under the tongue every 5 (five) minutes as needed.          . nystatin cream (MYCOSTATIN) Apply 1 application topically 2 (two) times daily as needed for dry skin.      Marland Kitchen olmesartan (BENICAR) 40 MG tablet Take 40 mg by mouth daily.      . traMADol (ULTRAM) 50 MG tablet Take 1 tablet (50 mg total) by mouth every 6 (six) hours as needed for pain.  20 tablet  0  . warfarin (COUMADIN) 5 MG tablet Take 5-7.5 mg by mouth daily. Take 1 tablet every day except take 1 and 1/2 tablets on wednesday       No current facility-administered medications for this visit.    Allergies  Allergen Reactions  . Piroxicam     PHELDENE REACTION: unspecified: blistering on hands  . Rofecoxib     REACTION: Reaction not known    Past Medical History  Diagnosis Date  . HYPERLIPIDEMIA 04/12/2007  . HYPERTENSION 04/12/2007  . CORONARY ARTERY DISEASE 04/12/2007  . GERD 04/12/2007  . DIABETES MELLITUS, TYPE II, UNCONTROLLED 06/08/2007  . SLEEP APNEA, OBSTRUCTIVE 01/16/2009  .  RENAL ARTERY STENOSIS 01/16/2009  . ESOPHAGEAL STRICTURE 01/16/2009  . PSORIASIS 01/16/2009  . PULMONARY EMBOLISM 01/20/2009  . 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 NEPHROLITHIASIS, HX OF 02/27/2009  . DEPRESSION 03/08/2009  . DYSPNEA 03/22/2010  . ANEMIA-UNSPECIFIED 04/28/2010  . DIABETES MELLITUS-TYPE II 09/01/2010  . Atherosclerosis of renal artery   . Coronary atherosclerosis of unspecified type of vessel, native or graft     s/p DES to RCA x2 in 2005; s/p DES to CFX 2009; s/p DES PTCA of RCA for  in stent restenosis 03/2010; s/p Promus DES x2 07/16/10(recurrent pain with abnormal Myoview oct 18,2011; residual at cath 07/16/10: LAD 30%; CFX 30%; RCA 30% ISR.  Marland Kitchen Leukopenia 11/11/2011    Past Surgical History  Procedure Laterality Date  . Knee arthroscopy  2007  . Rca stent      x2  . Angioplasty  2005 & 2011  . Coronary angioplasty with stent placement    . Esophagogastroduodenoscopy    . Colonoscopy    . Carpal tunnel release  2013    nudelman    Family History  Problem Relation Age of Onset  .  Pulmonary embolism Mother   . Factor V Leiden deficiency Mother   . Diabetes Brother   . Heart failure Brother   . Hypertension Brother     History   Social History  . Marital Status: Married    Spouse Name: N/A    Number of Children: N/A  . Years of Education: N/A   Occupational History  . Not on file.   Social History Main Topics  . Smoking status: Never Smoker   . Smokeless tobacco: Never Used  . Alcohol Use: No  . Drug Use: No  . Sexually Active: Not on file   Other Topics Concern  . Not on file   Social History Narrative  . No narrative on file    ROS: Please see the HPI.  All other systems reviewed and negative.  PHYSICAL EXAM:  BP 112/74  Pulse 98  Ht 5\' 11"  (1.803 m)  Wt 271 lb (122.925 kg)  BMI 37.81 kg/m2  SpO2 96%  General: Well developed, well nourished, in no acute distress. Head:  Normocephalic and atraumatic. Neck: no JVD Lungs: Clear to auscultation and percussion. Heart: Normal S1 and S2.  No murmur, rubs or gallops.  Pulses: Pulses normal in all 4 extremities. Extremities: No clubbing or cyanosis. No edema.  Healing scab on the R small finger.   Neurologic: Alert and oriented x 3.  EKG:  NSR.  Left axis deviation.  Possible lateral infarct.  CT angio of the chest 12/2012    IMPRESSION: No CT evidence for acute pulmonary embolus.  Lung parenchyma degraded by motion artifact, but no airspace consolidation is evident.   Original Report Authenticated By: Kennith Center, M.D.        Last Resulted: 12/26/12 10:22 AM     ASSESSMENT AND PLAN:  1.  History of Factor V Leiden with PE --- remains on anticoagulant.  2.  History of mutliple stents with clopidogrel hyporesponsiveness  -- remains on low dose prasugrel.  3.  Recent infected finger -- on the mend 4.  Hyperlipidemia  -- this should be addressed but linked to DM  -- would defer to Dr.Swords.

## 2013-01-09 NOTE — Patient Instructions (Addendum)
Your physician recommends that you schedule a follow-up appointment in: WITH DR. Excell Seltzer 04/12/13 @ 8:45  NO CHANGES WERE MADE TODAY

## 2013-01-12 NOTE — Assessment & Plan Note (Signed)
He was highly resistant to clopidogrel with PRU of over 300 ? Carrier of LOF gene??.  Responded nicely to prasugrel.  Remains on 5mg  for longer term treatment as he has 4 stents in the RCA  (2 TAXUS and 2 Promus) and a CFX stent.  Long discussion re: need for prevention of trauma on combination of antiplatelet and antithrombin treatment.

## 2013-01-12 NOTE — Assessment & Plan Note (Signed)
Dyslipidemia largely driven by likely diabetes with triglyceride elevation.  Will have patient return to Dr. Cato Mulligan to address.

## 2013-01-12 NOTE — Assessment & Plan Note (Signed)
Remains on life long treatment.  Continue as organized by heme.

## 2013-01-14 ENCOUNTER — Telehealth: Payer: Self-pay | Admitting: *Deleted

## 2013-01-14 ENCOUNTER — Ambulatory Visit: Payer: BC Managed Care – PPO | Admitting: Oncology

## 2013-01-14 NOTE — Telephone Encounter (Signed)
Received call from pt requesting to cancel appt today d/t diarrhea.  Pt states that he has had diarrhea X5 this am; reports is drinking well and wife is going out to get Imodium this am.  Instructed pt to continue and drink plenty of fluids and to call if any questions/problems.  Pt verbalized understanding.

## 2013-01-15 ENCOUNTER — Ambulatory Visit (INDEPENDENT_AMBULATORY_CARE_PROVIDER_SITE_OTHER): Payer: BC Managed Care – PPO | Admitting: Family

## 2013-01-15 DIAGNOSIS — L03113 Cellulitis of right upper limb: Secondary | ICD-10-CM

## 2013-01-15 DIAGNOSIS — L02519 Cutaneous abscess of unspecified hand: Secondary | ICD-10-CM

## 2013-01-15 DIAGNOSIS — I2699 Other pulmonary embolism without acute cor pulmonale: Secondary | ICD-10-CM

## 2013-01-15 LAB — CBC WITH DIFFERENTIAL/PLATELET
Basophils Relative: 1.3 % (ref 0.0–3.0)
Eosinophils Absolute: 0.2 10*3/uL (ref 0.0–0.7)
Eosinophils Relative: 6.3 % — ABNORMAL HIGH (ref 0.0–5.0)
HCT: 35.5 % — ABNORMAL LOW (ref 39.0–52.0)
Hemoglobin: 12 g/dL — ABNORMAL LOW (ref 13.0–17.0)
MCHC: 33.9 g/dL (ref 30.0–36.0)
MCV: 86.3 fl (ref 78.0–100.0)
Monocytes Absolute: 0 10*3/uL — ABNORMAL LOW (ref 0.1–1.0)
Neutro Abs: 2.3 10*3/uL (ref 1.4–7.7)
RBC: 4.11 Mil/uL — ABNORMAL LOW (ref 4.22–5.81)
WBC: 3.2 10*3/uL — ABNORMAL LOW (ref 4.5–10.5)

## 2013-01-15 NOTE — Patient Instructions (Addendum)
Eat an extra serving of green veggies. Take 1/2 tab of Coumadin tomorrow. Continue 7.gmg on Wednesday only. All other days 5mg .   Anticoagulation Dose Instructions as of 01/15/2013     [name] Tue Wed Thu Fri Sat   New Dose 5 mg 5 mg 5 mg 7.5 mg 5 mg 5 mg 5 mg    Description       Eat an extra serving of green veggies. Take 1/2 tab of Coumadin tomorrow. Continue 7.gmg on Wednesday only. All other days 5mg .

## 2013-01-22 ENCOUNTER — Other Ambulatory Visit: Payer: Self-pay | Admitting: *Deleted

## 2013-01-22 MED ORDER — PRASUGREL HCL 5 MG PO TABS: ORAL_TABLET | ORAL | Status: DC

## 2013-02-08 ENCOUNTER — Other Ambulatory Visit: Payer: Self-pay | Admitting: Internal Medicine

## 2013-02-12 ENCOUNTER — Other Ambulatory Visit: Payer: Self-pay | Admitting: Internal Medicine

## 2013-02-12 ENCOUNTER — Ambulatory Visit (INDEPENDENT_AMBULATORY_CARE_PROVIDER_SITE_OTHER): Payer: BC Managed Care – PPO | Admitting: Family

## 2013-02-12 DIAGNOSIS — I2699 Other pulmonary embolism without acute cor pulmonale: Secondary | ICD-10-CM

## 2013-02-12 LAB — POCT INR: INR: 2.4

## 2013-02-12 NOTE — Patient Instructions (Signed)
Continue 7.5mg  on Wednesday only. All other days 5mg .   Anticoagulation Dose Instructions as of 02/12/2013     [name] Tue Wed Thu Fri Sat   New Dose 5 mg 5 mg 5 mg 7.5 mg 5 mg 5 mg 5 mg    Description       Continue 7.5mg  on Wednesday only. All other days 5mg .

## 2013-02-26 ENCOUNTER — Other Ambulatory Visit: Payer: Self-pay

## 2013-02-26 MED ORDER — WARFARIN SODIUM 5 MG PO TABS: ORAL_TABLET | ORAL | Status: DC

## 2013-02-27 ENCOUNTER — Ambulatory Visit: Payer: PRIVATE HEALTH INSURANCE | Admitting: Internal Medicine

## 2013-02-28 ENCOUNTER — Other Ambulatory Visit: Payer: Self-pay | Admitting: *Deleted

## 2013-03-01 ENCOUNTER — Other Ambulatory Visit: Payer: Self-pay | Admitting: Internal Medicine

## 2013-03-03 ENCOUNTER — Other Ambulatory Visit: Payer: Self-pay | Admitting: Oncology

## 2013-03-06 ENCOUNTER — Other Ambulatory Visit (INDEPENDENT_AMBULATORY_CARE_PROVIDER_SITE_OTHER): Payer: BC Managed Care – PPO

## 2013-03-06 ENCOUNTER — Ambulatory Visit (INDEPENDENT_AMBULATORY_CARE_PROVIDER_SITE_OTHER): Payer: BC Managed Care – PPO | Admitting: Family

## 2013-03-06 DIAGNOSIS — I2699 Other pulmonary embolism without acute cor pulmonale: Secondary | ICD-10-CM

## 2013-03-06 DIAGNOSIS — E785 Hyperlipidemia, unspecified: Secondary | ICD-10-CM

## 2013-03-06 LAB — POCT INR: INR: 1.7

## 2013-03-06 LAB — HEMOGLOBIN A1C: Hgb A1c MFr Bld: 8 % — ABNORMAL HIGH (ref 4.6–6.5)

## 2013-03-06 LAB — LIPID PANEL
HDL: 25.2 mg/dL — ABNORMAL LOW (ref >39.00)
Triglycerides: 335 mg/dL — ABNORMAL HIGH (ref 0.0–149.0)
VLDL: 67 mg/dL — ABNORMAL HIGH (ref 0.0–40.0)

## 2013-03-06 MED ORDER — NYSTATIN 100000 UNIT/GM EX CREA: 1.0000 "application " | TOPICAL_CREAM | Freq: Two times a day (BID) | CUTANEOUS | Status: DC | PRN

## 2013-03-06 NOTE — Patient Instructions (Addendum)
Take 2 tabs today only. Then Continue 7.5mg  on Wednesday only. All other days 5mg . Recheck in 1 month  Anticoagulation Dose Instructions as of 03/06/2013     [name] Tue Wed Thu Fri Sat   New Dose 5 mg 5 mg 5 mg 7.5 mg 5 mg 5 mg 5 mg    Description       Take 2 tabs today only. Then Continue 7.5mg  on Wednesday only. All other days 5mg . Recheck in 1 month

## 2013-03-13 ENCOUNTER — Encounter: Payer: Self-pay | Admitting: Internal Medicine

## 2013-03-13 ENCOUNTER — Ambulatory Visit (INDEPENDENT_AMBULATORY_CARE_PROVIDER_SITE_OTHER): Payer: BC Managed Care – PPO | Admitting: Internal Medicine

## 2013-03-13 ENCOUNTER — Encounter: Payer: BC Managed Care – PPO | Admitting: Family

## 2013-03-13 VITALS — BP 122/70 | HR 88 | Temp 98.2°F | Wt 265.0 lb

## 2013-03-13 DIAGNOSIS — E1159 Type 2 diabetes mellitus with other circulatory complications: Secondary | ICD-10-CM

## 2013-03-13 DIAGNOSIS — I10 Essential (primary) hypertension: Secondary | ICD-10-CM

## 2013-03-13 DIAGNOSIS — E119 Type 2 diabetes mellitus without complications: Secondary | ICD-10-CM

## 2013-03-13 DIAGNOSIS — I701 Atherosclerosis of renal artery: Secondary | ICD-10-CM

## 2013-03-13 DIAGNOSIS — E785 Hyperlipidemia, unspecified: Secondary | ICD-10-CM

## 2013-03-14 NOTE — Assessment & Plan Note (Signed)
Lipid Panel     Component Value Date/Time   CHOL 148 03/06/2013 0838   TRIG 335.0* 03/06/2013 0838   HDL 25.20* 03/06/2013 0838   CHOLHDL 6 03/06/2013 0838   VLDL 67.0* 03/06/2013 0838   LDLCALC  Value: UNABLE TO CALCULATE IF TRIGLYCERIDE OVER 400 mg/dL        Total Cholesterol/HDL:CHD Risk Coronary Heart Disease Risk Table                     Men   Women  1/2 Average Risk   3.4   3.3  Average Risk       5.0   4.4  2 X Average Risk   9.6   7.1  3 X Average Risk  23.4   11.0        Use the calculated Patient Ratio above and the CHD Risk Table to determine the patient's CHD Risk.        ATP III CLASSIFICATION (LDL):  <100     mg/dL   Optimal  161-096  mg/dL   Near or Above                    Optimal  130-159  mg/dL   Borderline  045-409  mg/dL   High  >811     mg/dL   Very High 06/09/7828 5621   Fair control-- lipids will improve with weight loss

## 2013-03-14 NOTE — Assessment & Plan Note (Signed)
No clinical sxs

## 2013-03-14 NOTE — Assessment & Plan Note (Signed)
a1c is better and he has lost some weight Encouraged more aggressive weight loss

## 2013-03-14 NOTE — Assessment & Plan Note (Signed)
Adequately controlled Continue same meds 

## 2013-03-14 NOTE — Progress Notes (Signed)
Patient ID: [name], male   DOB: September 01, 1951, 62 y.o.   MRN: [id number]  patient comes in for followup of multiple medical problems including type 2 diabetes, hyperlipidemia, hypertension. The patient does not check blood sugar or blood pressure at home. The patetient does not follow an exercise or diet program. The patient denies any polyuria, polydipsia.  In the past the patient has gone to diabetic treatment center. The patient is tolerating medications  Without difficulty. The patient does admit to medication compliance.   Past Medical History  Diagnosis Date  . HYPERLIPIDEMIA 04/12/2007  . HYPERTENSION 04/12/2007  . CORONARY ARTERY DISEASE 04/12/2007  . GERD 04/12/2007  . DIABETES MELLITUS, TYPE II, UNCONTROLLED 06/08/2007  . SLEEP APNEA, OBSTRUCTIVE 01/16/2009  . RENAL ARTERY STENOSIS 01/16/2009  . ESOPHAGEAL STRICTURE 01/16/2009  . PSORIASIS 01/16/2009  . PULMONARY EMBOLISM 01/20/2009  . 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 NEPHROLITHIASIS, HX OF 02/27/2009  . DEPRESSION 03/08/2009  . DYSPNEA 03/22/2010  . ANEMIA-UNSPECIFIED 04/28/2010  . DIABETES MELLITUS-TYPE II 09/01/2010  . Atherosclerosis of renal artery   . Coronary atherosclerosis of unspecified type of vessel, native or graft     s/p DES to RCA x2 in 2005; s/p DES to CFX 2009; s/p DES PTCA of RCA for  in stent restenosis 03/2010; s/p Promus DES x2 07/16/10(recurrent pain with abnormal Myoview oct 18,2011; residual at cath 07/16/10: LAD 30%; CFX 30%; RCA 30% ISR.  Marland Kitchen Leukopenia 11/11/2011    History   Social History  . Marital Status: Married    Spouse Name: N/A    Number of Children: N/A  . Years of Education: N/A   Occupational History  . Not on file.   Social History Main Topics  . Smoking status: Never Smoker   . Smokeless tobacco: Never Used  . Alcohol Use: No  . Drug Use: No  . Sexually Active: Not on file   Other Topics Concern  . Not on file   Social History Narrative  . No narrative on file    Past Surgical  History  Procedure Laterality Date  . Knee arthroscopy  2007  . Rca stent      x2  . Angioplasty  2005 & 2011  . Coronary angioplasty with stent placement    . Esophagogastroduodenoscopy    . Colonoscopy    . Carpal tunnel release  2013    nudelman    Family History  Problem Relation Age of Onset  . Pulmonary embolism Mother   . Factor V Leiden deficiency Mother   . Diabetes Brother   . Heart failure Brother   . Hypertension Brother     Allergies  Allergen Reactions  . Piroxicam     PHELDENE REACTION: unspecified: blistering on hands  . Rofecoxib     REACTION: Reaction not known    Current Outpatient Prescriptions on File Prior to Visit  Medication Sig Dispense Refill  . atorvastatin (LIPITOR) 40 MG tablet Take 1 tablet (40 mg total) by mouth daily.  90 tablet  3  . B-D ULTRAFINE III SHORT PEN 31G X 8 MM MISC USE AS DIRECTED  100 each  7  . Blood Glucose Monitoring Suppl (CONTOUR BLOOD GLUCOSE SYSTEM) DEVI 1 Device by Does not apply route daily.  1 Device  0  . chlorthalidone (HYGROTON) 25 MG tablet Take 1 tablet (25 mg total) by mouth daily.  30 tablet  5  . citalopram (CELEXA) 20 MG tablet TAKE 1 TABLET BY MOUTH DAILY  30  tablet  5  . esomeprazole (NEXIUM) 40 MG capsule Take 1 capsule (40 mg total) by mouth daily.  30 capsule  3  . fish oil-omega-3 fatty acids 1000 MG capsule Take 3 g by mouth daily.       . furosemide (LASIX) 20 MG tablet Take 20 mg by mouth as needed.      Marland Kitchen glucose blood (BAYER CONTOUR TEST) test strip 1 each by Other route 2 (two) times daily.  100 each  12  . insulin aspart (NOVOLOG) 100 UNIT/ML injection Inject 44 Units into the skin 4 (four) times daily.       Marland Kitchen LEVEMIR FLEXPEN 100 UNIT/ML SOPN INJECT 50 UNITS SUBCUTANEOULSY TWICE A DAY AS DIRECTED  3 pen  5  . metoprolol succinate (TOPROL-XL) 25 MG 24 hr tablet TAKE 1 TABLET BY MOUTH EVERY DAY  30 tablet  6  . nitroGLYCERIN (NITROSTAT) 0.4 MG SL tablet Place 0.4 mg under the tongue every 5 (five)  minutes as needed.        . nystatin cream (MYCOSTATIN) Apply 1 application topically 2 (two) times daily as needed for dry skin.  30 g  1  . olmesartan (BENICAR) 40 MG tablet Take 40 mg by mouth daily.      . prasugrel (EFFIENT) 5 MG TABS TAKE 1 TABLET EVERY DAY  30 tablet  5  . traMADol (ULTRAM) 50 MG tablet Take 1 tablet (50 mg total) by mouth every 6 (six) hours as needed for pain.  20 tablet  0  . warfarin (COUMADIN) 5 MG tablet Take 1 tablet every day except take 1 and 1/2 tablets on wednesday  45 tablet  2   No current facility-administered medications on file prior to visit.     patient denies chest pain, shortness of breath, orthopnea. Denies lower extremity edema, abdominal pain, change in appetite, change in bowel movements. Patient denies rashes, musculoskeletal complaints. No other specific complaints in a complete review of systems.   BP 122/70  Pulse 88  Temp(Src) 98.2 F (36.8 C) (Oral)  Wt 265 lb (120.203 kg)  BMI 36.98 kg/m2   well-developed well-nourished male in no acute distress. HEENT exam atraumatic, normocephalic, neck supple without jugular venous distention. Chest clear to auscultation cardiac exam S1-S2 are regular. Abdominal exam overweight with bowel sounds, soft and nontender. Extremities no edema. Neurologic exam is alert with a normal gait.

## 2013-04-04 ENCOUNTER — Encounter: Payer: BC Managed Care – PPO | Admitting: Family

## 2013-04-04 ENCOUNTER — Other Ambulatory Visit: Payer: Self-pay

## 2013-04-04 ENCOUNTER — Ambulatory Visit (INDEPENDENT_AMBULATORY_CARE_PROVIDER_SITE_OTHER): Payer: BC Managed Care – PPO | Admitting: General Practice

## 2013-04-04 DIAGNOSIS — K219 Gastro-esophageal reflux disease without esophagitis: Secondary | ICD-10-CM

## 2013-04-04 DIAGNOSIS — I2699 Other pulmonary embolism without acute cor pulmonale: Secondary | ICD-10-CM

## 2013-04-04 DIAGNOSIS — M1710 Unilateral primary osteoarthritis, unspecified knee: Secondary | ICD-10-CM

## 2013-04-04 LAB — POCT INR: INR: 2.6

## 2013-04-04 MED ORDER — ESOMEPRAZOLE MAGNESIUM 40 MG PO CPDR: 40.0000 mg | DELAYED_RELEASE_CAPSULE | Freq: Every day | ORAL | Status: DC

## 2013-04-12 ENCOUNTER — Ambulatory Visit (INDEPENDENT_AMBULATORY_CARE_PROVIDER_SITE_OTHER): Payer: BC Managed Care – PPO | Admitting: Cardiovascular Disease

## 2013-04-12 ENCOUNTER — Encounter: Payer: Self-pay | Admitting: Cardiovascular Disease

## 2013-04-12 VITALS — BP 120/70 | HR 81 | Ht 71.0 in | Wt 266.0 lb

## 2013-04-12 DIAGNOSIS — I2510 Atherosclerotic heart disease of native coronary artery without angina pectoris: Secondary | ICD-10-CM

## 2013-04-12 NOTE — Progress Notes (Signed)
HPI:   [name] is a 62 year old gentleman returning for followup evaluation. The patient has been followed by Dr. Riley Kill. He has a history of coronary artery disease status post multiple PCI procedures. He's been treated with both first and second generation drug-eluting stents. His most recent PCI was in 2011. He has multiple stents in the right coronary artery and also has undergone stenting of the left circumflex. He has a history of pulmonary embolus and is heterozygous for factor V Leiden. He's been maintained on long-term anticoagulation. Because of Plavix hyperresponsiveness and recurrent ACS presentations, he was switched to low-dose effient 5 mg and has done well on this combination of effient and warfarin without aspirin. Recent lipids showed a cholesterol of 148, triglycerides 335, HDL 25, LDL 83, and hemoglobin A1c 8.0.  The patient is doing very well. He tells me he feels better than he's felt in a long time. He denies exertional chest pain or dyspnea. He denies leg swelling. He's not been very active and is limited by neck, back, and leg pains. He has fairly extensive arthritis. He denies any cardiopulmonary symptoms. He scraped his left shin a few days ago and is concerned about this with his history of diabetes. He is working on portion reduction and diet. His previous anginal equivalent has been arm and jaw discomfort.   Outpatient Encounter Prescriptions as of 04/12/2013  Medication Sig Dispense Refill  . atorvastatin (LIPITOR) 40 MG tablet Take 1 tablet (40 mg total) by mouth daily.  90 tablet  3  . B-D ULTRAFINE III SHORT PEN 31G X 8 MM MISC USE AS DIRECTED  100 each  7  . Blood Glucose Monitoring Suppl (CONTOUR BLOOD GLUCOSE SYSTEM) DEVI 1 Device by Does not apply route daily.  1 Device  0  . chlorthalidone (HYGROTON) 25 MG tablet Take 1 tablet (25 mg total) by mouth daily.  30 tablet  5  . citalopram (CELEXA) 20 MG tablet TAKE 1 TABLET BY MOUTH DAILY  30 tablet  5  .  esomeprazole (NEXIUM) 40 MG capsule Take 1 capsule (40 mg total) by mouth daily.  30 capsule  3  . fish oil-omega-3 fatty acids 1000 MG capsule Take 3 g by mouth daily.       . furosemide (LASIX) 20 MG tablet Take 20 mg by mouth as needed.      Marland Kitchen glucose blood (BAYER CONTOUR TEST) test strip 1 each by Other route 2 (two) times daily.  100 each  12  . insulin aspart (NOVOLOG) 100 UNIT/ML injection Inject 44 Units into the skin 4 (four) times daily.       Marland Kitchen LEVEMIR FLEXPEN 100 UNIT/ML SOPN INJECT 50 UNITS SUBCUTANEOULSY TWICE A DAY AS DIRECTED  3 pen  5  . metFORMIN (GLUCOPHAGE) 1000 MG tablet       . metoprolol succinate (TOPROL-XL) 25 MG 24 hr tablet TAKE 1 TABLET BY MOUTH EVERY DAY  30 tablet  6  . nitroGLYCERIN (NITROSTAT) 0.4 MG SL tablet Place 0.4 mg under the tongue every 5 (five) minutes as needed.        . nystatin cream (MYCOSTATIN) Apply 1 application topically 2 (two) times daily as needed for dry skin.  30 g  1  . olmesartan (BENICAR) 40 MG tablet Take 40 mg by mouth daily.      . prasugrel (EFFIENT) 5 MG TABS TAKE 1 TABLET EVERY DAY  30 tablet  5  . traMADol (ULTRAM) 50 MG tablet Take 1 tablet (  50 mg total) by mouth every 6 (six) hours as needed for pain.  20 tablet  0  . warfarin (COUMADIN) 5 MG tablet Take 1 tablet every day except take 1 and 1/2 tablets on wednesday  45 tablet  2   No facility-administered encounter medications on file as of 04/12/2013.    Allergies  Allergen Reactions  . Piroxicam     PHELDENE REACTION: unspecified: blistering on hands  . Rofecoxib     REACTION: Reaction not known    Past Medical History  Diagnosis Date  . HYPERLIPIDEMIA 04/12/2007  . HYPERTENSION 04/12/2007  . CORONARY ARTERY DISEASE 04/12/2007  . GERD 04/12/2007  . DIABETES MELLITUS, TYPE II, UNCONTROLLED 06/08/2007  . SLEEP APNEA, OBSTRUCTIVE 01/16/2009  . RENAL ARTERY STENOSIS 01/16/2009  . ESOPHAGEAL STRICTURE 01/16/2009  . PSORIASIS 01/16/2009  . PULMONARY EMBOLISM 01/20/2009  .  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 NEPHROLITHIASIS, HX OF 02/27/2009  . DEPRESSION 03/08/2009  . DYSPNEA 03/22/2010  . ANEMIA-UNSPECIFIED 04/28/2010  . DIABETES MELLITUS-TYPE II 09/01/2010  . Atherosclerosis of renal artery   . Coronary atherosclerosis of unspecified type of vessel, native or graft     s/p DES to RCA x2 in 2005; s/p DES to CFX 2009; s/p DES PTCA of RCA for  in stent restenosis 03/2010; s/p Promus DES x2 07/16/10(recurrent pain with abnormal Myoview oct 18,2011; residual at cath 07/16/10: LAD 30%; CFX 30%; RCA 30% ISR.  Marland Kitchen Leukopenia 11/11/2011   ROS: Negative except as per HPI  BP 120/70  Pulse 81  Ht 5\' 11"  (1.803 m)  Wt 120.657 kg (266 lb)  BMI 37.12 kg/m2  SpO2 97%  PHYSICAL EXAM: Pt is alert and oriented, overweight male in NAD HEENT: normal Neck: JVP - normal, carotids 2+= without bruits Lungs: CTA bilaterally CV: RRR without murmur or gallop Abd: soft, NT, Positive BS, no hepatomegaly, obese Ext: no C/C/E, distal pulses intact and equal Skin: warm/dry no rash  Last EKG from 01/09/2013 showed sinus rhythm with a possible lateral infarct and nonspecific ST abnormality. There was left axis deviation noted.  ASSESSMENT AND PLAN: 1. Coronary artery disease, native vessel. Currently CCS class I symptoms. He will remain on his current anticoagulation strategy with warfarin and low-dose effient. This is the longest he has gone without PCI with his last procedure in 2011.  2. Hyperlipidemia. Patient is appropriately on my intensity treatment with atorvastatin 40 mg. Lipids are primarily a pattern of low HDL and high triglycerides. Much of this is related to control of his diabetes. The patient has a good understanding of this and will continue to work on carbohydrate restriction, weight loss, and increased exercise as tolerated.  3. Hypertension. Blood pressure is well controlled on his current medical program. He is on appropriate disease specific treatment with an angiotensin  receptor blocker and a beta blocker.  For followup I will see him back in 6 months.  Tonny Bollman 04/12/2013 9:27 AM

## 2013-04-12 NOTE — Patient Instructions (Addendum)
Your physician wants you to follow-up in:  6 months. You will receive a reminder letter in the mail two months in advance. If you don't receive a letter, please call our office to schedule the follow-up appointment.   

## 2013-05-04 ENCOUNTER — Other Ambulatory Visit: Payer: Self-pay | Admitting: Internal Medicine

## 2013-05-06 ENCOUNTER — Ambulatory Visit (INDEPENDENT_AMBULATORY_CARE_PROVIDER_SITE_OTHER): Payer: BC Managed Care – PPO | Admitting: General Practice

## 2013-05-06 DIAGNOSIS — I2699 Other pulmonary embolism without acute cor pulmonale: Secondary | ICD-10-CM

## 2013-05-21 ENCOUNTER — Other Ambulatory Visit: Payer: Self-pay | Admitting: *Deleted

## 2013-05-21 MED ORDER — NITROGLYCERIN 0.4 MG SL SUBL: 0.4000 mg | SUBLINGUAL_TABLET | SUBLINGUAL | Status: DC | PRN

## 2013-06-03 ENCOUNTER — Ambulatory Visit (INDEPENDENT_AMBULATORY_CARE_PROVIDER_SITE_OTHER): Payer: BC Managed Care – PPO | Admitting: General Practice

## 2013-06-03 ENCOUNTER — Ambulatory Visit: Payer: BC Managed Care – PPO

## 2013-06-03 DIAGNOSIS — I2699 Other pulmonary embolism without acute cor pulmonale: Secondary | ICD-10-CM

## 2013-06-03 LAB — POCT INR: INR: 2.7

## 2013-06-11 ENCOUNTER — Other Ambulatory Visit: Payer: Self-pay | Admitting: Internal Medicine

## 2013-06-20 ENCOUNTER — Other Ambulatory Visit: Payer: Self-pay | Admitting: Cardiology

## 2013-07-05 ENCOUNTER — Other Ambulatory Visit (INDEPENDENT_AMBULATORY_CARE_PROVIDER_SITE_OTHER): Payer: BC Managed Care – PPO

## 2013-07-05 DIAGNOSIS — E1159 Type 2 diabetes mellitus with other circulatory complications: Secondary | ICD-10-CM

## 2013-07-05 LAB — HEPATIC FUNCTION PANEL
ALT: 36 U/L (ref 0–53)
Albumin: 3.9 g/dL (ref 3.5–5.2)
Bilirubin, Direct: 0 mg/dL (ref 0.0–0.3)
Total Protein: 7.7 g/dL (ref 6.0–8.3)

## 2013-07-05 LAB — BASIC METABOLIC PANEL
CO2: 27 mEq/L (ref 19–32)
Calcium: 10.6 mg/dL — ABNORMAL HIGH (ref 8.4–10.5)
Creatinine, Ser: 1 mg/dL (ref 0.4–1.5)
Glucose, Bld: 72 mg/dL (ref 70–99)

## 2013-07-05 LAB — MICROALBUMIN / CREATININE URINE RATIO
Creatinine,U: 138.5 mg/dL
Microalb Creat Ratio: 1.3 mg/g (ref 0.0–30.0)
Microalb, Ur: 1.8 mg/dL (ref 0.0–1.9)

## 2013-07-08 ENCOUNTER — Other Ambulatory Visit: Payer: Self-pay

## 2013-07-08 MED ORDER — WARFARIN SODIUM 5 MG PO TABS: ORAL_TABLET | ORAL | Status: DC

## 2013-07-12 ENCOUNTER — Encounter: Payer: Self-pay | Admitting: Internal Medicine

## 2013-07-12 ENCOUNTER — Ambulatory Visit (INDEPENDENT_AMBULATORY_CARE_PROVIDER_SITE_OTHER): Payer: BC Managed Care – PPO | Admitting: Internal Medicine

## 2013-07-12 VITALS — BP 122/62 | HR 112 | Temp 98.3°F | Ht 71.0 in | Wt 268.0 lb

## 2013-07-12 DIAGNOSIS — Z2911 Encounter for prophylactic immunotherapy for respiratory syncytial virus (RSV): Secondary | ICD-10-CM

## 2013-07-12 DIAGNOSIS — Z23 Encounter for immunization: Secondary | ICD-10-CM

## 2013-07-12 DIAGNOSIS — E785 Hyperlipidemia, unspecified: Secondary | ICD-10-CM

## 2013-07-12 DIAGNOSIS — I2510 Atherosclerotic heart disease of native coronary artery without angina pectoris: Secondary | ICD-10-CM

## 2013-07-12 DIAGNOSIS — I10 Essential (primary) hypertension: Secondary | ICD-10-CM

## 2013-07-12 DIAGNOSIS — I2699 Other pulmonary embolism without acute cor pulmonale: Secondary | ICD-10-CM

## 2013-07-12 DIAGNOSIS — E119 Type 2 diabetes mellitus without complications: Secondary | ICD-10-CM

## 2013-07-12 LAB — POCT INR: INR: 2

## 2013-07-12 NOTE — Progress Notes (Signed)
Dm- no sxs but not exercising or dieting Tolerating meds  Weight- not exercising/dieting  Lipids- tolerating meds  CAD- on effient (warfarin for PE- factor V)  Reviewed meds, pmh, psh, sochx   patient denies chest pain, shortness of breath, orthopnea. Denies lower extremity edema, abdominal pain, change in appetite, change in bowel movements. Patient denies rashes, musculoskeletal complaints. No other specific complaints in a complete review of systems.    well-developed well-nourished male in no acute distress. HEENT exam atraumatic, normocephalic, neck supple without jugular venous distention. Chest clear to auscultation cardiac exam S1-S2 are regular. Abdominal exam overweight with bowel sounds, soft and nontender. Extremities no edema. Neurologic exam is alert with a normal gait.

## 2013-07-12 NOTE — Patient Instructions (Addendum)
Follow up 6 months Anticoagulation Dose Instructions as of 07/12/2013     [name] Tue Wed Thu Fri Sat   New Dose 5 mg 5 mg 5 mg 7.5 mg 5 mg 5 mg 5 mg    Description       Continue to take 1 tablet all days except 1 1/2 tablets on Wednesdays.  Re-check in 6 weeks.

## 2013-07-14 NOTE — Assessment & Plan Note (Signed)
LDL is not at goal. Continue Lipitor 40 mg by mouth daily. Follow a low calorie diet. Concentrate on losing weight. He should lose 1 pound a week for the foreseeable future. This is gone for at least one year and probably 2 years. Filed Weights   07/12/13 0930  Weight: 268 lb (121.564 kg)

## 2013-07-14 NOTE — Assessment & Plan Note (Signed)
Patient has no symptoms. Will need to continue aggressive risk factor modification.

## 2013-07-14 NOTE — Assessment & Plan Note (Signed)
Reviewed A1c and weight. Patient needs to exercise daily and follow a low calorie diet. Continue same medications for the time being.

## 2013-07-14 NOTE — Assessment & Plan Note (Signed)
BP Readings from Last 3 Encounters:  07/12/13 122/62  04/12/13 120/70  03/13/13 122/70   Controlled. Continue same meds.

## 2013-07-15 ENCOUNTER — Ambulatory Visit: Payer: BC Managed Care – PPO

## 2013-07-20 ENCOUNTER — Other Ambulatory Visit: Payer: Self-pay | Admitting: Cardiology

## 2013-07-26 ENCOUNTER — Other Ambulatory Visit: Payer: Self-pay | Admitting: Internal Medicine

## 2013-07-28 ENCOUNTER — Other Ambulatory Visit: Payer: Self-pay | Admitting: Family

## 2013-08-09 ENCOUNTER — Telehealth: Payer: Self-pay | Admitting: Oncology

## 2013-08-09 NOTE — Telephone Encounter (Signed)
Pt called and r/s appt for MD only r/s from April 2014

## 2013-08-12 ENCOUNTER — Ambulatory Visit (INDEPENDENT_AMBULATORY_CARE_PROVIDER_SITE_OTHER): Payer: BC Managed Care – PPO | Admitting: General Practice

## 2013-08-12 DIAGNOSIS — I2699 Other pulmonary embolism without acute cor pulmonale: Secondary | ICD-10-CM

## 2013-08-12 NOTE — Progress Notes (Signed)
Pre-visit discussion using our clinic review tool. No additional management support is needed unless otherwise documented below in the visit note.  

## 2013-08-13 ENCOUNTER — Other Ambulatory Visit: Payer: Self-pay | Admitting: Cardiology

## 2013-09-03 ENCOUNTER — Encounter: Payer: Self-pay | Admitting: Family

## 2013-09-03 ENCOUNTER — Ambulatory Visit (INDEPENDENT_AMBULATORY_CARE_PROVIDER_SITE_OTHER): Payer: BC Managed Care – PPO | Admitting: Family

## 2013-09-03 VITALS — BP 120/80 | HR 97 | Wt 273.0 lb

## 2013-09-03 DIAGNOSIS — M79604 Pain in right leg: Secondary | ICD-10-CM

## 2013-09-03 DIAGNOSIS — M79609 Pain in unspecified limb: Secondary | ICD-10-CM

## 2013-09-03 DIAGNOSIS — J019 Acute sinusitis, unspecified: Secondary | ICD-10-CM

## 2013-09-03 DIAGNOSIS — I2699 Other pulmonary embolism without acute cor pulmonale: Secondary | ICD-10-CM

## 2013-09-03 LAB — POCT INR: INR: 2.9

## 2013-09-03 MED ORDER — AMOXICILLIN 500 MG PO TABS: 1000.0000 mg | ORAL_TABLET | Freq: Two times a day (BID) | ORAL | Status: AC

## 2013-09-03 MED ORDER — FLUTICASONE PROPIONATE 50 MCG/ACT NA SUSP: 2.0000 | Freq: Every day | NASAL | Status: AC

## 2013-09-03 MED ORDER — GLIPIZIDE 10 MG PO TABS: 10.0000 mg | ORAL_TABLET | Freq: Two times a day (BID) | ORAL | Status: DC

## 2013-09-03 NOTE — Patient Instructions (Addendum)
Continue to take 1 tablet all days except 1 1/2 tablets on Wednesdays.  Re-check in 4 weeks.  Anticoagulation Dose Instructions as of 09/03/2013     [name] Tue Wed Thu Fri Sat   New Dose 5 mg 5 mg 5 mg 7.5 mg 5 mg 5 mg 5 mg    Description       Continue to take 1 tablet all days except 1 1/2 tablets on Wednesdays.  Re-check in 4 weeks.     Sinusitis Sinusitis is redness, soreness, and swelling (inflammation) of the paranasal sinuses. Paranasal sinuses are air pockets within the bones of your face (beneath the eyes, the middle of the forehead, or above the eyes). In healthy paranasal sinuses, mucus is able to drain out, and air is able to circulate through them by way of your nose. However, when your paranasal sinuses are inflamed, mucus and air can become trapped. This can allow bacteria and other germs to grow and cause infection. Sinusitis can develop quickly and last only a short time (acute) or continue over a long period (chronic). Sinusitis that lasts for more than 12 weeks is considered chronic.  CAUSES  Causes of sinusitis include:  Allergies.  Structural abnormalities, such as displacement of the cartilage that separates your nostrils (deviated septum), which can decrease the air flow through your nose and sinuses and affect sinus drainage.  Functional abnormalities, such as when the small hairs (cilia) that line your sinuses and help remove mucus do not work properly or are not present. SYMPTOMS  Symptoms of acute and chronic sinusitis are the same. The primary symptoms are pain and pressure around the affected sinuses. Other symptoms include:  Upper toothache.  Earache.  Headache.  Bad breath.  Decreased sense of smell and taste.  A cough, which worsens when you are lying flat.  Fatigue.  Fever.  Thick drainage from your nose, which often is green and may contain pus (purulent).  Swelling and warmth over the affected sinuses. DIAGNOSIS  Your caregiver will  perform a physical exam. During the exam, your caregiver may:  Look in your nose for signs of abnormal growths in your nostrils (nasal polyps).  Tap over the affected sinus to check for signs of infection.  View the inside of your sinuses (endoscopy) with a special imaging device with a light attached (endoscope), which is inserted into your sinuses. If your caregiver suspects that you have chronic sinusitis, one or more of the following tests may be recommended:  Allergy tests.  Nasal culture A sample of mucus is taken from your nose and sent to a lab and screened for bacteria.  Nasal cytology A sample of mucus is taken from your nose and examined by your caregiver to determine if your sinusitis is related to an allergy. TREATMENT  Most cases of acute sinusitis are related to a viral infection and will resolve on their own within 10 days. Sometimes medicines are prescribed to help relieve symptoms (pain medicine, decongestants, nasal steroid sprays, or saline sprays).  However, for sinusitis related to a bacterial infection, your caregiver will prescribe antibiotic medicines. These are medicines that will help kill the bacteria causing the infection.  Rarely, sinusitis is caused by a fungal infection. In theses cases, your caregiver will prescribe antifungal medicine. For some cases of chronic sinusitis, surgery is needed. Generally, these are cases in which sinusitis recurs more than 3 times per year, despite other treatments. HOME CARE INSTRUCTIONS   Drink plenty of water. Water helps  thin the mucus so your sinuses can drain more easily.  Use a humidifier.  Inhale steam 3 to 4 times a day (for example, sit in the bathroom with the shower running).  Apply a warm, moist washcloth to your face 3 to 4 times a day, or as directed by your caregiver.  Use saline nasal sprays to help moisten and clean your sinuses.  Take over-the-counter or prescription medicines for pain, discomfort, or  fever only as directed by your caregiver. SEEK IMMEDIATE MEDICAL CARE IF:  You have increasing pain or severe headaches.  You have nausea, vomiting, or drowsiness.  You have swelling around your face.  You have vision problems.  You have a stiff neck.  You have difficulty breathing. MAKE SURE YOU:   Understand these instructions.  Will watch your condition.  Will get help right away if you are not doing well or get worse. Document Released: 09/12/2005 Document Revised: 12/05/2011 Document Reviewed: 09/27/2011 [facility] Patient Information 2014 [place], [place].

## 2013-09-03 NOTE — Progress Notes (Signed)
Subjective:    Patient ID: [name], male    DOB: 1951/04/03, 62 y.o.   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 His intake and over-the-counter Tylenol without much relief.  Patient has concerns of diarrhea over the last several months it occurs about 3-4 times per week. Is currently taking metformin 1000 mg twice a day. Concerned that it is causing him to have frequent diarrhea. Denies any blood in his stools are dark black stools.  Patient continues to complain of bilateral leg pain that he feels maybe related to peripheral neuropathy. Reports certain areas of his legs being tender to touch. Pain is worse with walking. Denies any redness or swelling.   Review of Systems  Constitutional: Negative.   HENT: Positive for congestion, sinus pressure, sneezing and sore throat. Negative for facial swelling.   Respiratory: Positive for cough.   Cardiovascular: Negative.   Gastrointestinal: Negative.   Endocrine: Negative.   Genitourinary: Negative.   Skin: Negative.   Allergic/Immunologic: Negative.   Neurological: Negative.   Psychiatric/Behavioral: Negative.    Past Medical History  Diagnosis Date  . HYPERLIPIDEMIA 04/12/2007  . HYPERTENSION 04/12/2007  . CORONARY ARTERY DISEASE 04/12/2007  . GERD 04/12/2007  . DIABETES MELLITUS, TYPE II, UNCONTROLLED 06/08/2007  . SLEEP APNEA, OBSTRUCTIVE 01/16/2009  . RENAL ARTERY STENOSIS 01/16/2009  . ESOPHAGEAL STRICTURE 01/16/2009  . PSORIASIS 01/16/2009  . PULMONARY EMBOLISM 01/20/2009  . 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 NEPHROLITHIASIS, HX OF 02/27/2009  . DEPRESSION 03/08/2009  . DYSPNEA 03/22/2010  . ANEMIA-UNSPECIFIED 04/28/2010  . DIABETES MELLITUS-TYPE II  09/01/2010  . Atherosclerosis of renal artery   . Coronary atherosclerosis of unspecified type of vessel, native or graft     s/p DES to RCA x2 in 2005; s/p DES to CFX 2009; s/p DES PTCA of RCA for  in stent restenosis 03/2010; s/p Promus DES x2 07/16/10(recurrent pain with abnormal Myoview oct 18,2011; residual at cath 07/16/10: LAD 30%; CFX 30%; RCA 30% ISR.  Marland Kitchen Leukopenia 11/11/2011    History   Social History  . Marital Status: Married    Spouse Name: N/A    Number of Children: N/A  . Years of Education: N/A   Occupational History  . Not on file.   Social History Main Topics  . Smoking status: Never Smoker   . Smokeless tobacco: Never Used  . Alcohol Use: No  . Drug Use: No  . Sexual Activity: Not on file   Other Topics Concern  . Not on file   Social History Narrative  . No narrative on file    Past Surgical History  Procedure Laterality Date  . Knee arthroscopy  2007  . Rca stent      x2  . Angioplasty  2005 & 2011  . Coronary angioplasty with stent placement    . Esophagogastroduodenoscopy    . Colonoscopy    . Carpal tunnel release  2013    nudelman    Family History  Problem Relation Age of Onset  . Pulmonary embolism Mother   . Factor V Leiden deficiency Mother   . Diabetes Brother   . Heart failure Brother   . Hypertension Brother     Allergies  Allergen Reactions  . Piroxicam  PHELDENE REACTION: unspecified: blistering on hands  . Rofecoxib     REACTION: Reaction not known    Current Outpatient Prescriptions on File Prior to Visit  Medication Sig Dispense Refill  . atorvastatin (LIPITOR) 40 MG tablet Take 1 tablet (40 mg total) by mouth daily.  90 tablet  3  . B-D INS SYRINGE 0.5CC/31GX5/16 31G X 5/16" 0.5 ML MISC USE AS DIRECTED  100 each  3  . B-D ULTRAFINE III SHORT PEN 31G X 8 MM MISC USE AS DIRECTED  100 each  7  . BENICAR 40 MG tablet TAKE 1 TABLET EVERY DAY  90 tablet  2  . Blood Glucose Monitoring Suppl (CONTOUR BLOOD GLUCOSE  SYSTEM) DEVI 1 Device by Does not apply route daily.  1 Device  0  . chlorthalidone (HYGROTON) 25 MG tablet TAKE 1/2 TABLET BY MOUTH EVERY DAY      . citalopram (CELEXA) 20 MG tablet TAKE 1 TABLET BY MOUTH DAILY  30 tablet  5  . EFFIENT 5 MG TABS tablet TAKE 1 TABLET EVERY DAY  30 tablet  1  . fish oil-omega-3 fatty acids 1000 MG capsule Take 3 g by mouth daily.       . furosemide (LASIX) 20 MG tablet Take 20 mg by mouth as needed.      Marland Kitchen glucose blood (BAYER CONTOUR TEST) test strip 1 each by Other route 2 (two) times daily.  100 each  12  . insulin aspart (NOVOLOG) 100 UNIT/ML injection Inject 44 Units into the skin 4 (four) times daily.       Marland Kitchen LEVEMIR FLEXPEN 100 UNIT/ML SOPN INJECT 50 UNITS SUBCUTANEOULSY TWICE A DAY AS DIRECTED  3 pen  5  . metoprolol succinate (TOPROL-XL) 25 MG 24 hr tablet TAKE 1 TABLET BY MOUTH EVERY DAY  30 tablet  1  . NEXIUM 40 MG capsule TAKE 1 CAPSULE (40 MG TOTAL) BY MOUTH DAILY.  30 capsule  3  . nitroGLYCERIN (NITROSTAT) 0.4 MG SL tablet Place 1 tablet (0.4 mg total) under the tongue every 5 (five) minutes as needed.  25 tablet  3  . nystatin cream (MYCOSTATIN) Apply 1 application topically 2 (two) times daily as needed for dry skin.  30 g  1  . olmesartan (BENICAR) 40 MG tablet Take 40 mg by mouth daily.      . traMADol (ULTRAM) 50 MG tablet Take 1 tablet (50 mg total) by mouth every 6 (six) hours as needed for pain.  20 tablet  0  . warfarin (COUMADIN) 5 MG tablet Take 1 tablet every day except take 1 and 1/2 tablets on wednesday  45 tablet  2   No current facility-administered medications on file prior to visit.    BP 120/80  Pulse 97  Wt 273 lb (123.832 kg)chart    Objective:   Physical Exam  Constitutional: He is oriented to person, place, and time. He appears well-developed and well-nourished.  HENT:  Right Ear: External ear normal.  Left Ear: External ear normal.  Nose: Nose normal.  Mouth/Throat: Oropharynx is clear and moist.  Sinus  tenderness to palpation of the maxillary sinuses bilaterally.   Cardiovascular: Normal rate, regular rhythm and normal heart sounds.   Pulmonary/Chest: Effort normal and breath sounds normal.  Neurological: He is alert and oriented to person, place, and time.  Skin: Skin is warm and dry.  Psychiatric: He has a normal mood and affect.          Assessment & Plan:  assessment: 1. Acute sinusitis 2. Bilateral Leg Pain 3. Diarrhea 4. Type 2 DM  Plan: D/C Metformin. Glipizide 10 mg twice daily. Recheck in 4 weeks. Treat with Amoxil 500 mg twice a day x 10 days. Flonase 2 sprays in each nostril once a day. Order [place] of legs bilaterally.

## 2013-09-03 NOTE — Progress Notes (Signed)
Pre visit review using our clinic review tool, if applicable. No additional management support is needed unless otherwise documented below in the visit note. 

## 2013-09-09 ENCOUNTER — Encounter: Payer: Self-pay | Admitting: Family

## 2013-09-09 ENCOUNTER — Ambulatory Visit: Payer: BC Managed Care – PPO

## 2013-09-10 ENCOUNTER — Ambulatory Visit (HOSPITAL_COMMUNITY): Payer: BC Managed Care – PPO | Attending: Cardiology

## 2013-09-10 ENCOUNTER — Other Ambulatory Visit: Payer: Self-pay | Admitting: Cardiovascular Disease

## 2013-09-10 ENCOUNTER — Encounter: Payer: Self-pay | Admitting: Cardiology

## 2013-09-10 DIAGNOSIS — E785 Hyperlipidemia, unspecified: Secondary | ICD-10-CM | POA: Insufficient documentation

## 2013-09-10 DIAGNOSIS — I10 Essential (primary) hypertension: Secondary | ICD-10-CM | POA: Insufficient documentation

## 2013-09-10 DIAGNOSIS — M79604 Pain in right leg: Secondary | ICD-10-CM

## 2013-09-10 DIAGNOSIS — J019 Acute sinusitis, unspecified: Secondary | ICD-10-CM

## 2013-09-10 DIAGNOSIS — D682 Hereditary deficiency of other clotting factors: Secondary | ICD-10-CM | POA: Insufficient documentation

## 2013-09-10 DIAGNOSIS — Z86718 Personal history of other venous thrombosis and embolism: Secondary | ICD-10-CM

## 2013-09-10 DIAGNOSIS — I2510 Atherosclerotic heart disease of native coronary artery without angina pectoris: Secondary | ICD-10-CM | POA: Insufficient documentation

## 2013-09-10 DIAGNOSIS — R0602 Shortness of breath: Secondary | ICD-10-CM

## 2013-09-10 DIAGNOSIS — M79609 Pain in unspecified limb: Secondary | ICD-10-CM

## 2013-09-10 DIAGNOSIS — R609 Edema, unspecified: Secondary | ICD-10-CM | POA: Insufficient documentation

## 2013-09-10 DIAGNOSIS — I2699 Other pulmonary embolism without acute cor pulmonale: Secondary | ICD-10-CM

## 2013-09-10 DIAGNOSIS — E119 Type 2 diabetes mellitus without complications: Secondary | ICD-10-CM | POA: Insufficient documentation

## 2013-09-10 DIAGNOSIS — Z86711 Personal history of pulmonary embolism: Secondary | ICD-10-CM | POA: Insufficient documentation

## 2013-09-11 ENCOUNTER — Other Ambulatory Visit: Payer: Self-pay | Admitting: Family

## 2013-09-11 MED ORDER — GABAPENTIN 100 MG PO CAPS: 100.0000 mg | ORAL_CAPSULE | Freq: Three times a day (TID) | ORAL | Status: DC

## 2013-09-12 ENCOUNTER — Other Ambulatory Visit: Payer: Self-pay | Admitting: Family

## 2013-09-23 ENCOUNTER — Other Ambulatory Visit: Payer: Self-pay | Admitting: Cardiovascular Disease

## 2013-09-23 ENCOUNTER — Other Ambulatory Visit: Payer: Self-pay | Admitting: Internal Medicine

## 2013-09-26 LAB — HM DIABETES EYE EXAM

## 2013-10-01 ENCOUNTER — Ambulatory Visit (INDEPENDENT_AMBULATORY_CARE_PROVIDER_SITE_OTHER): Payer: BC Managed Care – PPO | Admitting: Family

## 2013-10-01 ENCOUNTER — Encounter: Payer: Self-pay | Admitting: Family

## 2013-10-01 VITALS — BP 122/86 | HR 67 | Wt 277.0 lb

## 2013-10-01 DIAGNOSIS — E1159 Type 2 diabetes mellitus with other circulatory complications: Secondary | ICD-10-CM

## 2013-10-01 DIAGNOSIS — I2699 Other pulmonary embolism without acute cor pulmonale: Secondary | ICD-10-CM

## 2013-10-01 DIAGNOSIS — G629 Polyneuropathy, unspecified: Secondary | ICD-10-CM

## 2013-10-01 DIAGNOSIS — G609 Hereditary and idiopathic neuropathy, unspecified: Secondary | ICD-10-CM

## 2013-10-01 LAB — POCT INR: INR: 2.8

## 2013-10-01 MED ORDER — CLOBETASOL PROP EMOLLIENT BASE 0.05 % EX CREA: 1.0000 "application " | TOPICAL_CREAM | Freq: Two times a day (BID) | CUTANEOUS | Status: DC

## 2013-10-01 NOTE — Patient Instructions (Signed)
Continue to take 1 tablet all days except 1 1/2 tablets on Wednesdays.  Re-check in 4 weeks.  Anticoagulation Dose Instructions as of 10/01/2013     Adam SmilesSun Mon Tue Wed Thu Fri Sat   New Dose 5 mg 5 mg 5 mg 7.5 mg 5 mg 5 mg 5 mg    Description       Continue to take 1 tablet all days except 1 1/2 tablets on Wednesdays.  Re-check in 4 weeks.

## 2013-10-01 NOTE — Progress Notes (Signed)
Subjective:    Patient ID: [name], male    DOB: 1950/10/26, 63 y.o.   MRN: [id number] HPI  [age] year caucasian male, nonsmoker, presenting today for follow-up on leg pain and related to peripheral neuropathy and change in diabetes medication.   At his last OV, he had a lower extremity ultrasound that was negative.  Neurontin was prescribed and he is doing well.  Metformin was discontinued due to stomach upset and Glipizide was ordered. Denies leg pain.  Blood sugar was 180 at lunch.   He is taking medications as prescribed.     Review of Systems  Constitutional: Negative.   HENT: Negative.   Eyes: Negative.   Respiratory: Negative.   Cardiovascular: Negative.   Gastrointestinal: Negative.   Endocrine: Negative.   Genitourinary: Negative.   Musculoskeletal: Negative.   Skin: Negative.   Allergic/Immunologic: Negative.   Neurological: Negative.   Hematological: Negative.   Psychiatric/Behavioral: Negative.    Past Medical History  Diagnosis Date  . HYPERLIPIDEMIA 04/12/2007  . HYPERTENSION 04/12/2007  . CORONARY ARTERY DISEASE 04/12/2007  . GERD 04/12/2007  . DIABETES MELLITUS, TYPE II, UNCONTROLLED 06/08/2007  . SLEEP APNEA, OBSTRUCTIVE 01/16/2009  . RENAL ARTERY STENOSIS 01/16/2009  . ESOPHAGEAL STRICTURE 01/16/2009  . PSORIASIS 01/16/2009  . PULMONARY EMBOLISM 01/20/2009  . 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 NEPHROLITHIASIS, HX OF 02/27/2009  . DEPRESSION 03/08/2009  . DYSPNEA 03/22/2010  . ANEMIA-UNSPECIFIED 04/28/2010  . DIABETES MELLITUS-TYPE II 09/01/2010  . Atherosclerosis of renal artery   . Coronary atherosclerosis of unspecified type of vessel, native or graft     s/p DES to RCA x2 in 2005; s/p DES to CFX 2009; s/p DES PTCA of RCA for  in stent restenosis 03/2010; s/p Promus DES x2 07/16/10(recurrent pain with abnormal Myoview oct 18,2011; residual at cath 07/16/10: LAD 30%; CFX 30%; RCA 30% ISR.  Marland Kitchen. Leukopenia 11/11/2011    History   Social History  . Marital Status:  Married    Spouse Name: N/A    Number of Children: N/A  . Years of Education: N/A   Occupational History  . Not on file.   Social History Main Topics  . Smoking status: Never Smoker   . Smokeless tobacco: Never Used  . Alcohol Use: No  . Drug Use: No  . Sexual Activity: Not on file   Other Topics Concern  . Not on file   Social History Narrative  . No narrative on file    Past Surgical History  Procedure Laterality Date  . Knee arthroscopy  2007  . Rca stent      x2  . Angioplasty  2005 & 2011  . Coronary angioplasty with stent placement    . Esophagogastroduodenoscopy    . Colonoscopy    . Carpal tunnel release  2013    nudelman    Family History  Problem Relation Age of Onset  . Pulmonary embolism Mother   . Factor V Leiden deficiency Mother   . Diabetes Brother   . Heart failure Brother   . Hypertension Brother     Allergies  Allergen Reactions  . Piroxicam     PHELDENE REACTION: unspecified: blistering on hands  . Rofecoxib     REACTION: Reaction not known    Current Outpatient Prescriptions on File Prior to Visit  Medication Sig Dispense Refill  . atorvastatin (LIPITOR) 40 MG tablet TAKE 1 TABLET EVERY DAY  90 tablet  3  . B-D INS SYRINGE 0.5CC/31GX5/16 31G X  5/16" 0.5 ML MISC USE AS DIRECTED  100 each  3  . B-D ULTRAFINE III SHORT PEN 31G X 8 MM MISC USE AS DIRECTED  100 each  7  . BENICAR 40 MG tablet TAKE 1 TABLET EVERY DAY  90 tablet  2  . Blood Glucose Monitoring Suppl (CONTOUR BLOOD GLUCOSE SYSTEM) DEVI 1 Device by Does not apply route daily.  1 Device  0  . chlorthalidone (HYGROTON) 25 MG tablet TAKE 1/2 TABLET BY MOUTH EVERY DAY      . chlorthalidone (HYGROTON) 25 MG tablet TAKE 1 TABLET BY MOUTH EVERY DAY  30 tablet  0  . citalopram (CELEXA) 20 MG tablet TAKE 1 TABLET BY MOUTH DAILY  30 tablet  5  . EFFIENT 5 MG TABS tablet TAKE 1 TABLET BY MOUTH DAILY  30 tablet  6  . fish oil-omega-3 fatty acids 1000 MG capsule Take 3 g by mouth daily.        . fluticasone (FLONASE) 50 MCG/ACT nasal spray Place 2 sprays into both nostrils daily.  16 g  6  . furosemide (LASIX) 20 MG tablet Take 20 mg by mouth as needed.      . gabapentin (NEURONTIN) 100 MG capsule Take 1 capsule (100 mg total) by mouth 3 (three) times daily.  90 capsule  3  . glipiZIDE (GLUCOTROL) 10 MG tablet Take 1 tablet (10 mg total) by mouth 2 (two) times daily before a meal.  60 tablet  3  . glucose blood (BAYER CONTOUR TEST) test strip 1 each by Other route 2 (two) times daily.  100 each  12  . insulin aspart (NOVOLOG) 100 UNIT/ML injection Inject 44 Units into the skin 4 (four) times daily.       Marland Kitchen LEVEMIR FLEXPEN 100 UNIT/ML SOPN INJECT 50 UNITS SUBCUTANEOULSY TWICE A DAY AS DIRECTED  3 pen  5  . metoprolol succinate (TOPROL-XL) 25 MG 24 hr tablet TAKE 1 TABLET BY MOUTH EVERY DAY  30 tablet  1  . NEXIUM 40 MG capsule TAKE 1 CAPSULE (40 MG TOTAL) BY MOUTH DAILY.  30 capsule  3  . nitroGLYCERIN (NITROSTAT) 0.4 MG SL tablet Place 1 tablet (0.4 mg total) under the tongue every 5 (five) minutes as needed.  25 tablet  3  . nystatin cream (MYCOSTATIN) APPLY 1 APPLICATION TOPICALLY 2 (TWO) TIMES DAILY AS NEEDED FOR DRY SKIN.  30 g  1  . olmesartan (BENICAR) 40 MG tablet Take 40 mg by mouth daily.      . traMADol (ULTRAM) 50 MG tablet Take 1 tablet (50 mg total) by mouth every 6 (six) hours as needed for pain.  20 tablet  0  . warfarin (COUMADIN) 5 MG tablet Take 1 tablet every day except take 1 and 1/2 tablets on wednesday  45 tablet  2   No current facility-administered medications on file prior to visit.    BP 122/86  Pulse 67  Wt 277 lb (125.646 kg)chart    Objective:   Physical Exam  Constitutional: He is oriented to person, place, and time. He appears well-developed and well-nourished.  HENT:  Right Ear: External ear normal.  Left Ear: External ear normal.  Nose: Nose normal.  Mouth/Throat: Oropharynx is clear and moist.  Neck: Normal range of motion. Neck supple.    Cardiovascular: Normal rate and regular rhythm.   Pulmonary/Chest: Effort normal and breath sounds normal.  Musculoskeletal: Normal range of motion.  Neurological: He is alert and oriented to person, place,  and time.  Skin: Skin is warm and dry.          Assessment & Plan:  Assessment 1. Peripheral Neuropathy 2. Type II DM-uncontrolled  Plan Continue current medications.  Increase Levimir by 4 Units.  Continue current dosage. Watch for signs and symptoms of bleeding.  Contact office for questions or concerns.

## 2013-10-02 ENCOUNTER — Telehealth: Payer: Self-pay

## 2013-10-02 NOTE — Telephone Encounter (Signed)
Relevant patient education assigned to patient using Emmi. ° °

## 2013-10-03 ENCOUNTER — Ambulatory Visit: Payer: BC Managed Care – PPO

## 2013-10-04 ENCOUNTER — Other Ambulatory Visit: Payer: Self-pay | Admitting: Internal Medicine

## 2013-10-07 ENCOUNTER — Telehealth: Payer: Self-pay | Admitting: Oncology

## 2013-10-07 NOTE — Telephone Encounter (Signed)
pt dtr called to cx appt for 1/13 per dtr pt has to attend a funeral and will call back to r/s. dtr informed that Ottis StainJG will be retiring in March and if pt wishes to see him prior to retirement he will need to call us back asap to r/s.

## 2013-10-08 ENCOUNTER — Ambulatory Visit: Payer: BC Managed Care – PPO | Admitting: Oncology

## 2013-10-10 ENCOUNTER — Other Ambulatory Visit: Payer: Self-pay | Admitting: Cardiovascular Disease

## 2013-10-21 ENCOUNTER — Encounter: Payer: Self-pay | Admitting: Family

## 2013-10-21 ENCOUNTER — Ambulatory Visit (INDEPENDENT_AMBULATORY_CARE_PROVIDER_SITE_OTHER): Payer: BC Managed Care – PPO | Admitting: Family

## 2013-10-21 VITALS — BP 132/80 | HR 87 | Ht 71.0 in | Wt 275.2 lb

## 2013-10-21 DIAGNOSIS — L03119 Cellulitis of unspecified part of limb: Principal | ICD-10-CM

## 2013-10-21 DIAGNOSIS — I10 Essential (primary) hypertension: Secondary | ICD-10-CM

## 2013-10-21 DIAGNOSIS — L02419 Cutaneous abscess of limb, unspecified: Secondary | ICD-10-CM

## 2013-10-21 MED ORDER — CEPHALEXIN 500 MG PO CAPS: 500.0000 mg | ORAL_CAPSULE | Freq: Four times a day (QID) | ORAL | Status: DC

## 2013-10-21 NOTE — Patient Instructions (Signed)
Cellulitis Cellulitis is an infection of the skin and the tissue beneath it. The infected area is usually red and tender. Cellulitis occurs most often in the arms and lower legs.  CAUSES  Cellulitis is caused by bacteria that enter the skin through cracks or cuts in the skin. The most common types of bacteria that cause cellulitis are Staphylococcus and Streptococcus. SYMPTOMS   Redness and warmth.  Swelling.  Tenderness or pain.  Fever. DIAGNOSIS  Your caregiver can usually determine what is wrong based on a physical exam. Blood tests may also be done. TREATMENT  Treatment usually involves taking an antibiotic medicine. HOME CARE INSTRUCTIONS   Take your antibiotics as directed. Finish them even if you start to feel better.  Keep the infected arm or leg elevated to reduce swelling.  Apply a warm cloth to the affected area up to 4 times per day to relieve pain.  Only take over-the-counter or prescription medicines for pain, discomfort, or fever as directed by your caregiver.  Keep all follow-up appointments as directed by your caregiver. SEEK MEDICAL CARE IF:   You notice red streaks coming from the infected area.  Your red area gets larger or turns dark in color.  Your bone or joint underneath the infected area becomes painful after the skin has healed.  Your infection returns in the same area or another area.  You notice a swollen bump in the infected area.  You develop new symptoms. SEEK IMMEDIATE MEDICAL CARE IF:   You have a fever.  You feel very sleepy.  You develop vomiting or diarrhea.  You have a general ill feeling (malaise) with muscle aches and pains. MAKE SURE YOU:   Understand these instructions.  Will watch your condition.  Will get help right away if you are not doing well or get worse. Document Released: 06/22/2005 Document Revised: 03/13/2012 Document Reviewed: 11/28/2011 [employer] Patient Information 2014 [employer].  

## 2013-10-21 NOTE — Progress Notes (Signed)
Subjective:    Patient ID: [name], male    DOB: October 15, 1950, 63 y.o.   MRN: [id number]  HPI  63 y.o. White male who presents today with chief complain of "leg sore" to his left leg. Pt has past hx of leg trauma that required extensive wound management at the wound center in [facility]. Pt states he hit his leg with his car door 1 week ago which left two small cuts to his mid-shin on left leg. Over the last 48 hours the cuts have become painful, warm and red. The pain is sharp and radiates around his shin; pt has been putting silver nitrate and peroxide to help make it better but has not seen any improvement. The pain and swelling are worse with ambulation. Pt denies fever, chill, fatigue and weight loss.     Review of Systems  Constitutional: Positive for activity change.       Caused by pain to left leg that limits ambulation.   HENT: Negative.   Eyes: Negative.   Respiratory: Negative.   Cardiovascular: Negative.   Gastrointestinal: Negative.   Endocrine: Negative.   Genitourinary: Negative.   Musculoskeletal: Negative.   Skin: Positive for color change and wound.       reddness around wound.   Allergic/Immunologic: Negative.   Neurological: Negative.   Hematological: Negative.   Psychiatric/Behavioral: Negative.    Past Medical History  Diagnosis Date  . HYPERLIPIDEMIA 04/12/2007  . HYPERTENSION 04/12/2007  . CORONARY ARTERY DISEASE 04/12/2007  . GERD 04/12/2007  . DIABETES MELLITUS, TYPE II, UNCONTROLLED 06/08/2007  . SLEEP APNEA, OBSTRUCTIVE 01/16/2009  . RENAL ARTERY STENOSIS 01/16/2009  . ESOPHAGEAL STRICTURE 01/16/2009  . PSORIASIS 01/16/2009  . PULMONARY EMBOLISM 01/20/2009  . 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 NEPHROLITHIASIS, HX OF 02/27/2009  . DEPRESSION 03/08/2009  . DYSPNEA 03/22/2010  . ANEMIA-UNSPECIFIED 04/28/2010  . DIABETES MELLITUS-TYPE II 09/01/2010  . Atherosclerosis of renal artery   . Coronary atherosclerosis of unspecified type of vessel, native or graft      s/p DES to RCA x2 in 2005; s/p DES to CFX 2009; s/p DES PTCA of RCA for  in stent restenosis 03/2010; s/p Promus DES x2 07/16/10(recurrent pain with abnormal Myoview oct 18,2011; residual at cath 07/16/10: LAD 30%; CFX 30%; RCA 30% ISR.  Marland Kitchen Leukopenia 11/11/2011    History   Social History  . Marital Status: Married    Spouse Name: N/A    Number of Children: N/A  . Years of Education: N/A   Occupational History  . Not on file.   Social History Main Topics  . Smoking status: Never Smoker   . Smokeless tobacco: Never Used  . Alcohol Use: No  . Drug Use: No  . Sexual Activity: Not on file   Other Topics Concern  . Not on file   Social History Narrative  . No narrative on file    Past Surgical History  Procedure Laterality Date  . Knee arthroscopy  2007  . Rca stent      x2  . Angioplasty  2005 & 2011  . Coronary angioplasty with stent placement    . Esophagogastroduodenoscopy    . Colonoscopy    . Carpal tunnel release  2013    nudelman    Family History  Problem Relation Age of Onset  . Pulmonary embolism Mother   . Factor V Leiden deficiency Mother   . Diabetes Brother   . Heart failure Brother   . Hypertension  Brother     Allergies  Allergen Reactions  . Piroxicam     PHELDENE REACTION: unspecified: blistering on hands  . Rofecoxib     REACTION: Reaction not known    Current Outpatient Prescriptions on File Prior to Visit  Medication Sig Dispense Refill  . atorvastatin (LIPITOR) 40 MG tablet TAKE 1 TABLET EVERY DAY  90 tablet  3  . B-D INS SYRINGE 0.5CC/31GX5/16 31G X 5/16" 0.5 ML MISC USE AS DIRECTED  100 each  3  . B-D ULTRAFINE III SHORT PEN 31G X 8 MM MISC USE AS DIRECTED  100 each  7  . BENICAR 40 MG tablet TAKE 1 TABLET EVERY DAY  90 tablet  2  . Blood Glucose Monitoring Suppl (CONTOUR BLOOD GLUCOSE SYSTEM) DEVI 1 Device by Does not apply route daily.  1 Device  0  . chlorthalidone (HYGROTON) 25 MG tablet TAKE 1/2 TABLET BY MOUTH EVERY DAY       . chlorthalidone (HYGROTON) 25 MG tablet TAKE 1 TABLET BY MOUTH EVERY DAY  30 tablet  0  . citalopram (CELEXA) 20 MG tablet TAKE 1 TABLET BY MOUTH DAILY  30 tablet  5  . Clobetasol Prop Emollient Base (CLOBETASOL PROPIONATE E) 0.05 % emollient cream Apply 1 application topically 2 (two) times daily.  30 g  0  . EFFIENT 5 MG TABS tablet TAKE 1 TABLET BY MOUTH DAILY  30 tablet  6  . fish oil-omega-3 fatty acids 1000 MG capsule Take 3 g by mouth daily.       . fluticasone (FLONASE) 50 MCG/ACT nasal spray Place 2 sprays into both nostrils daily.  16 g  6  . furosemide (LASIX) 20 MG tablet Take 20 mg by mouth as needed.      . gabapentin (NEURONTIN) 100 MG capsule Take 1 capsule (100 mg total) by mouth 3 (three) times daily.  90 capsule  3  . glipiZIDE (GLUCOTROL) 10 MG tablet Take 1 tablet (10 mg total) by mouth 2 (two) times daily before a meal.  60 tablet  3  . glucose blood (BAYER CONTOUR TEST) test strip 1 each by Other route 2 (two) times daily.  100 each  12  . LEVEMIR FLEXPEN 100 UNIT/ML SOPN INJECT 50 UNITS SUBCUTANEOULSY TWICE A DAY AS DIRECTED  3 pen  5  . metoprolol succinate (TOPROL-XL) 25 MG 24 hr tablet TAKE 1 TABLET BY MOUTH EVERY DAY  30 tablet  0  . NEXIUM 40 MG capsule TAKE 1 CAPSULE (40 MG TOTAL) BY MOUTH DAILY.  30 capsule  3  . nitroGLYCERIN (NITROSTAT) 0.4 MG SL tablet Place 1 tablet (0.4 mg total) under the tongue every 5 (five) minutes as needed.  25 tablet  3  . NOVOLOG 100 UNIT/ML injection INJECT 44 UNITS INTO THE SKIN 3 (THREE) TIMES DAILY BEFORE MEALS.  50 mL  9  . nystatin cream (MYCOSTATIN) APPLY 1 APPLICATION TOPICALLY 2 (TWO) TIMES DAILY AS NEEDED FOR DRY SKIN.  30 g  1  . olmesartan (BENICAR) 40 MG tablet Take 40 mg by mouth daily.      . traMADol (ULTRAM) 50 MG tablet Take 1 tablet (50 mg total) by mouth every 6 (six) hours as needed for pain.  20 tablet  0  . warfarin (COUMADIN) 5 MG tablet Take 1 tablet every day except take 1 and 1/2 tablets on wednesday  45  tablet  2   No current facility-administered medications on file prior to visit.  BP 132/80  Pulse 87  Ht 5\' 11"  (1.803 m)  Wt 275 lb 3.2 oz (124.83 kg)  BMI 38.40 kg/m2chart    Objective:   Physical Exam  Constitutional: He is oriented to person, place, and time. He appears well-developed and well-nourished. He is active.  HENT:  Head: Normocephalic.  Nose: Nose normal.  Cardiovascular: Normal rate, regular rhythm and normal heart sounds.   Pulses:      Dorsalis pedis pulses are 1+ on the right side, and 1+ on the left side.       Posterior tibial pulses are 1+ on the right side, and 1+ on the left side.  Pulmonary/Chest: Effort normal and breath sounds normal.  Abdominal: Soft. Normal appearance and bowel sounds are normal.  Neurological: He is alert and oriented to person, place, and time.  Skin: Skin is warm and dry. Lesion noted.     2 lesions to mid-shin on left leg that have black scabbing. Red inflammation around lesions with swelling extending 2 inches below lesions.   Psychiatric: He has a normal mood and affect. His speech is normal.          Assessment & Plan:  Mr. Edgington is a 63 y.o. White male who present with chief complaint of "leg sore" to left leg.  Cellulitis: Wound culture left leg lesion.   - Start Keflex 500mg  QID po. For infection.  Education: elevate leg, apply ice as needed for pain relief.   - Finish antibiotic as prescribed, do not quit before prescribed time.  Follow up as needed if lesions do not heal or redness/swelling get worse.

## 2013-10-21 NOTE — Progress Notes (Signed)
Pre visit review using our clinic review tool, if applicable. No additional management support is needed unless otherwise documented below in the visit note. 

## 2013-10-24 ENCOUNTER — Other Ambulatory Visit: Payer: Self-pay | Admitting: Internal Medicine

## 2013-10-24 LAB — WOUND CULTURE
GRAM STAIN: NONE SEEN
GRAM STAIN: NONE SEEN
Gram Stain: NONE SEEN
Organism ID, Bacteria: NO GROWTH

## 2013-10-27 ENCOUNTER — Other Ambulatory Visit: Payer: Self-pay | Admitting: Internal Medicine

## 2013-10-28 ENCOUNTER — Ambulatory Visit: Payer: BC Managed Care – PPO

## 2013-10-30 ENCOUNTER — Telehealth: Payer: Self-pay | Admitting: Internal Medicine

## 2013-10-30 NOTE — Telephone Encounter (Signed)
Relevant patient education mailed to patient.  

## 2013-11-03 ENCOUNTER — Other Ambulatory Visit: Payer: Self-pay | Admitting: Family

## 2013-11-03 ENCOUNTER — Other Ambulatory Visit: Payer: Self-pay | Admitting: Cardiovascular Disease

## 2013-11-04 ENCOUNTER — Other Ambulatory Visit: Payer: Self-pay | Admitting: General Practice

## 2013-11-04 MED ORDER — WARFARIN SODIUM 5 MG PO TABS: ORAL_TABLET | ORAL | Status: DC

## 2013-11-05 ENCOUNTER — Encounter: Payer: Self-pay | Admitting: Family

## 2013-11-05 ENCOUNTER — Other Ambulatory Visit: Payer: Self-pay | Admitting: Cardiovascular Disease

## 2013-11-07 ENCOUNTER — Ambulatory Visit: Payer: BC Managed Care – PPO

## 2013-11-14 ENCOUNTER — Ambulatory Visit (INDEPENDENT_AMBULATORY_CARE_PROVIDER_SITE_OTHER): Payer: BC Managed Care – PPO | Admitting: General Practice

## 2013-11-14 DIAGNOSIS — I2699 Other pulmonary embolism without acute cor pulmonale: Secondary | ICD-10-CM

## 2013-11-14 DIAGNOSIS — Z5181 Encounter for therapeutic drug level monitoring: Secondary | ICD-10-CM

## 2013-11-14 LAB — POCT INR: INR: 2.8

## 2013-11-14 NOTE — Progress Notes (Signed)
Pre-visit discussion using our clinic review tool. No additional management support is needed unless otherwise documented below in the visit note.  

## 2013-11-19 ENCOUNTER — Other Ambulatory Visit: Payer: Self-pay | Admitting: Internal Medicine

## 2013-11-25 ENCOUNTER — Encounter: Payer: Self-pay | Admitting: Oncology

## 2013-12-04 ENCOUNTER — Encounter: Payer: Self-pay | Admitting: Family

## 2013-12-04 ENCOUNTER — Ambulatory Visit (INDEPENDENT_AMBULATORY_CARE_PROVIDER_SITE_OTHER): Payer: BC Managed Care – PPO | Admitting: Family

## 2013-12-04 VITALS — BP 130/70 | HR 95 | Temp 98.1°F | Wt 272.0 lb

## 2013-12-04 DIAGNOSIS — J040 Acute laryngitis: Secondary | ICD-10-CM

## 2013-12-04 DIAGNOSIS — I2699 Other pulmonary embolism without acute cor pulmonale: Secondary | ICD-10-CM

## 2013-12-04 DIAGNOSIS — Z5181 Encounter for therapeutic drug level monitoring: Secondary | ICD-10-CM

## 2013-12-04 LAB — POCT INR: INR: 2.6

## 2013-12-04 MED ORDER — METHYLPREDNISOLONE ACETATE 80 MG/ML IJ SUSP
80.0000 mg | Freq: Once | INTRAMUSCULAR | Status: AC
  Administered 2013-12-04: 80 mg via INTRAMUSCULAR

## 2013-12-04 NOTE — Patient Instructions (Addendum)
Continue to take 1 tablet all days except 1 1/2 tablets on Wednesdays.  Re-check in 6 weeks  Anticoagulation Dose Instructions as of 12/04/2013     Adam SmilesSun Mon Tue Wed Thu Fri Sat   New Dose 5 mg 5 mg 5 mg 7.5 mg 5 mg 5 mg 5 mg    Description       Continue to take 1 tablet all days except 1 1/2 tablets on Wednesdays.  Re-check in 6 weeks.     Laryngitis At the top of your windpipe is your voice box. It is the source of your voice. Inside your voice box are 2 bands of muscles called vocal cords. When you breathe, your vocal cords are relaxed and open so that air can get into the lungs. When you decide to say something, these cords come together and vibrate. The sound from these vibrations goes into your throat and comes out through your mouth as sound. Laryngitis is an inflammation of the vocal cords that causes hoarseness, cough, loss of voice, sore throat, and dry throat. Laryngitis can be temporary (acute) or long-term (chronic). Most cases of acute laryngitis improve with time.Chronic laryngitis lasts for more than 3 weeks. CAUSES Laryngitis can often be related to excessive smoking, talking, or yelling, as well as inhalation of toxic fumes and allergies. Acute laryngitis is usually caused by a viral infection, vocal strain, measles or mumps, or bacterial infections. Chronic laryngitis is usually caused by vocal cord strain, vocal cord injury, postnasal drip, growths on the vocal cords, or acid reflux. SYMPTOMS   Cough.  Sore throat.  Dry throat. RISK FACTORS  Respiratory infections.  Exposure to irritating substances, such as cigarette smoke, excessive amounts of alcohol, stomach acids, and workplace chemicals.  Voice trauma, such as vocal cord injury from shouting or speaking too loud. DIAGNOSIS  Your cargiver will perform a physical exam. During the physical exam, your caregiver will examine your throat. The most common sign of laryngitis is hoarseness. Laryngoscopy may be necessary  to confirm the diagnosis of this condition. This procedure allows your caregiver to look into the larynx. HOME CARE INSTRUCTIONS  Drink enough fluids to keep your urine clear or pale yellow.  Rest until you no longer have symptoms or as directed by your caregiver.  Breathe in moist air.  Take all medicine as directed by your caregiver.  Do not smoke.  Talk as little as possible (this includes whispering).  Write on paper instead of talking until your voice is back to normal.  Follow up with your caregiver if your condition has not improved after 10 days. SEEK MEDICAL CARE IF:   You have trouble breathing.  You cough up blood.  You have persistent fever.  You have increasing pain.  You have difficulty swallowing. MAKE SURE YOU:  Understand these instructions.  Will watch your condition.  Will get help right away if you are not doing well or get worse. Document Released: 09/12/2005 Document Revised: 12/05/2011 Document Reviewed: 11/18/2010 [facility] Patient Information 2014 [employer] [place].

## 2013-12-04 NOTE — Progress Notes (Signed)
Subjective:    Patient ID: [name], male    DOB: 1950/10/17, 63 y.o.   MRN: [id number]  HPI 63 y.o. White male presents today with chief complaint of "horseness". Pt states that he has had the hoarseness for 4 weeks, it is continuous, worse when he is talking or singing and he has not tried anything to relieve it. He denies sputum production, rhinorrhea and cough. Denies fever, fatigue, change in appetite.     Review of Systems  Constitutional: Negative.   HENT: Positive for voice change.   Eyes: Negative.   Respiratory: Negative.   Cardiovascular: Negative.   Gastrointestinal: Negative.   Endocrine: Negative.   Genitourinary: Negative.   Musculoskeletal: Negative.   Skin: Negative.   Allergic/Immunologic: Negative.   Neurological: Negative.   Hematological: Negative.   Psychiatric/Behavioral: Negative.    Past Medical History  Diagnosis Date  . HYPERLIPIDEMIA 04/12/2007  . HYPERTENSION 04/12/2007  . CORONARY ARTERY DISEASE 04/12/2007  . GERD 04/12/2007  . DIABETES MELLITUS, TYPE II, UNCONTROLLED 06/08/2007  . SLEEP APNEA, OBSTRUCTIVE 01/16/2009  . RENAL ARTERY STENOSIS 01/16/2009  . ESOPHAGEAL STRICTURE 01/16/2009  . PSORIASIS 01/16/2009  . PULMONARY EMBOLISM 01/20/2009  . 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 NEPHROLITHIASIS, HX OF 02/27/2009  . DEPRESSION 03/08/2009  . DYSPNEA 03/22/2010  . ANEMIA-UNSPECIFIED 04/28/2010  . DIABETES MELLITUS-TYPE II 09/01/2010  . Atherosclerosis of renal artery   . Coronary atherosclerosis of unspecified type of vessel, native or graft     s/p DES to RCA x2 in 2005; s/p DES to CFX 2009; s/p DES PTCA of RCA for  in stent restenosis 03/2010; s/p Promus DES x2 07/16/10(recurrent pain with abnormal Myoview oct 18,2011; residual at cath 07/16/10: LAD 30%; CFX 30%; RCA 30% ISR.  Marland Kitchen. Leukopenia 11/11/2011    History   Social History  . Marital Status: Married    Spouse Name: N/A    Number of Children: N/A  . Years of Education: N/A   Occupational  History  . Not on file.   Social History Main Topics  . Smoking status: Never Smoker   . Smokeless tobacco: Never Used  . Alcohol Use: No  . Drug Use: No  . Sexual Activity: Not on file   Other Topics Concern  . Not on file   Social History Narrative  . No narrative on file    Past Surgical History  Procedure Laterality Date  . Knee arthroscopy  2007  . Rca stent      x2  . Angioplasty  2005 & 2011  . Coronary angioplasty with stent placement    . Esophagogastroduodenoscopy    . Colonoscopy    . Carpal tunnel release  2013    nudelman    Family History  Problem Relation Age of Onset  . Pulmonary embolism Mother   . Factor V Leiden deficiency Mother   . Diabetes Brother   . Heart failure Brother   . Hypertension Brother     Allergies  Allergen Reactions  . Piroxicam     PHELDENE REACTION: unspecified: blistering on hands  . Rofecoxib     REACTION: Reaction not known    Current Outpatient Prescriptions on File Prior to Visit  Medication Sig Dispense Refill  . atorvastatin (LIPITOR) 40 MG tablet TAKE 1 TABLET EVERY DAY  90 tablet  3  . B-D INS SYRINGE 0.5CC/31GX5/16 31G X 5/16" 0.5 ML MISC USE AS DIRECTED  100 each  3  . B-D ULTRAFINE III SHORT PEN  31G X 8 MM MISC USE AS DIRECTED  100 each  7  . BENICAR 40 MG tablet TAKE 1 TABLET EVERY DAY  90 tablet  2  . Blood Glucose Monitoring Suppl (CONTOUR BLOOD GLUCOSE SYSTEM) DEVI 1 Device by Does not apply route daily.  1 Device  0  . cephALEXin (KEFLEX) 500 MG capsule Take 1 capsule (500 mg total) by mouth 4 (four) times daily.  28 capsule  1  . chlorthalidone (HYGROTON) 25 MG tablet TAKE 1/2 TABLET BY MOUTH EVERY DAY      . chlorthalidone (HYGROTON) 25 MG tablet TAKE 1 TABLET BY MOUTH EVERY DAY  30 tablet  0  . citalopram (CELEXA) 20 MG tablet TAKE 1 TABLET BY MOUTH DAILY  30 tablet  5  . Clobetasol Prop Emollient Base (CLOBETASOL PROPIONATE E) 0.05 % emollient cream Apply 1 application topically 2 (two) times daily.   30 g  0  . EFFIENT 5 MG TABS tablet TAKE 1 TABLET BY MOUTH DAILY  30 tablet  6  . fish oil-omega-3 fatty acids 1000 MG capsule Take 3 g by mouth daily.       . fluticasone (FLONASE) 50 MCG/ACT nasal spray Place 2 sprays into both nostrils daily.  16 g  6  . furosemide (LASIX) 20 MG tablet Take 20 mg by mouth as needed.      . gabapentin (NEURONTIN) 100 MG capsule Take 1 capsule (100 mg total) by mouth 3 (three) times daily.  90 capsule  3  . glipiZIDE (GLUCOTROL) 10 MG tablet Take 1 tablet (10 mg total) by mouth 2 (two) times daily before a meal.  60 tablet  3  . glucose blood (BAYER CONTOUR TEST) test strip 1 each by Other route 2 (two) times daily.  100 each  12  . LEVEMIR FLEXTOUCH 100 UNIT/ML Pen INJECT 50 UNITS SUBCUTANEOULSY TWICE A DAY AS DIRECTED  45 mL  5  . metoprolol succinate (TOPROL-XL) 25 MG 24 hr tablet TAKE 1 TABLET BY MOUTH EVERY DAY  30 tablet  0  . NEXIUM 40 MG capsule TAKE 1 CAPSULE (40 MG TOTAL) BY MOUTH DAILY.  30 capsule  3  . nitroGLYCERIN (NITROSTAT) 0.4 MG SL tablet Place 1 tablet (0.4 mg total) under the tongue every 5 (five) minutes as needed.  25 tablet  3  . NOVOLOG 100 UNIT/ML injection INJECT 44 UNITS INTO THE SKIN 3 (THREE) TIMES DAILY BEFORE MEALS.  50 mL  9  . nystatin cream (MYCOSTATIN) APPLY 1 APPLICATION TOPICALLY 2 (TWO) TIMES DAILY AS NEEDED FOR DRY SKIN.  30 g  1  . olmesartan (BENICAR) 40 MG tablet Take 40 mg by mouth daily.      . traMADol (ULTRAM) 50 MG tablet Take 1 tablet (50 mg total) by mouth every 6 (six) hours as needed for pain.  20 tablet  0  . warfarin (COUMADIN) 5 MG tablet TAKE AS DIRECTED  10 tablet  0   No current facility-administered medications on file prior to visit.    BP 130/70  Pulse 95  Temp(Src) 98.1 F (36.7 C) (Oral)  Wt 272 lb (123.378 kg)chart    Objective:   Physical Exam  Constitutional: He is oriented to person, place, and time. He appears well-developed and well-nourished. He is active.  HENT:  Head:  Normocephalic.  Right Ear: Tympanic membrane, external ear and ear canal normal.  Left Ear: Tympanic membrane, external ear and ear canal normal.  Nose: Nose normal.  Mouth/Throat: Uvula is  midline and oropharynx is clear and moist.  Cardiovascular: Normal rate, regular rhythm and normal heart sounds.   Pulmonary/Chest: Effort normal and breath sounds normal.  Neurological: He is alert and oriented to person, place, and time.  Skin: Skin is warm, dry and intact.  Psychiatric: He has a normal mood and affect. His speech is normal and behavior is normal.          Assessment & Plan:  [name] was seen today for no specified reason.  Diagnoses and associated orders for this visit:  Encounter for therapeutic drug monitoring  Pulmonary embolism  Laryngitis - methylPREDNISolone acetate (DEPO-MEDROL) injection 80 mg; Inject 1 mL (80 mg total) into the muscle once.  Other Orders - POCT INR   Education: Drink plenty of fluids, 64oz per day to keep hydrated.   Follow up: April 20th as scheduled.

## 2013-12-05 ENCOUNTER — Other Ambulatory Visit: Payer: Self-pay | Admitting: Cardiovascular Disease

## 2013-12-05 ENCOUNTER — Other Ambulatory Visit: Payer: Self-pay | Admitting: Internal Medicine

## 2013-12-06 ENCOUNTER — Other Ambulatory Visit: Payer: Self-pay | Admitting: Internal Medicine

## 2013-12-06 ENCOUNTER — Other Ambulatory Visit: Payer: Self-pay | Admitting: General Practice

## 2013-12-06 MED ORDER — WARFARIN SODIUM 5 MG PO TABS: ORAL_TABLET | ORAL | Status: DC

## 2013-12-10 ENCOUNTER — Encounter: Payer: Self-pay | Admitting: Internal Medicine

## 2013-12-10 ENCOUNTER — Other Ambulatory Visit: Payer: Self-pay | Admitting: Family

## 2013-12-10 MED ORDER — METHYLPREDNISOLONE 4 MG PO KIT: PACK | ORAL | Status: AC

## 2013-12-16 ENCOUNTER — Other Ambulatory Visit: Payer: Self-pay | Admitting: Orthopedic Surgery

## 2013-12-16 DIAGNOSIS — M898X5 Other specified disorders of bone, thigh: Secondary | ICD-10-CM

## 2013-12-19 ENCOUNTER — Other Ambulatory Visit: Payer: BC Managed Care – PPO

## 2013-12-19 ENCOUNTER — Ambulatory Visit
Admission: RE | Admit: 2013-12-19 | Discharge: 2013-12-19 | Disposition: A | Discharge status: Home / Self Care | Payer: BC Managed Care – PPO | Source: Ambulatory Visit | Attending: Orthopedic Surgery | Admitting: Orthopedic Surgery

## 2013-12-19 DIAGNOSIS — M898X5 Other specified disorders of bone, thigh: Secondary | ICD-10-CM

## 2013-12-21 ENCOUNTER — Other Ambulatory Visit: Payer: Self-pay | Admitting: Internal Medicine

## 2013-12-23 ENCOUNTER — Ambulatory Visit (INDEPENDENT_AMBULATORY_CARE_PROVIDER_SITE_OTHER): Payer: BC Managed Care – PPO | Admitting: General Practice

## 2013-12-23 ENCOUNTER — Telehealth: Payer: Self-pay | Admitting: General Practice

## 2013-12-23 DIAGNOSIS — I2699 Other pulmonary embolism without acute cor pulmonale: Secondary | ICD-10-CM

## 2013-12-23 DIAGNOSIS — Z5181 Encounter for therapeutic drug level monitoring: Secondary | ICD-10-CM

## 2013-12-23 LAB — POCT INR: INR: 2.4

## 2013-12-23 NOTE — Patient Instructions (Signed)
Please refer to Dr. Sherene SiresWainer's instructions for holding coumadin for surgery.

## 2013-12-23 NOTE — Telephone Encounter (Signed)
LMOVM

## 2013-12-23 NOTE — Progress Notes (Signed)
Pre visit review using our clinic review tool, if applicable. No additional management support is needed unless otherwise documented below in the visit note. 

## 2013-12-24 ENCOUNTER — Other Ambulatory Visit: Payer: Self-pay | Admitting: Internal Medicine

## 2013-12-24 NOTE — Pre-Procedure Instructions (Signed)
[name]  12/24/2013   Your procedure is scheduled on:  Mon, April 6 @ 9:45 AM  [facility].  Call this number if you have problems the morning of surgery: [phone]   Remember:   Do not eat food or drink liquids after midnight.   Take these medicines the morning of surgery with A SIP OF WATER: Keflex(Cephalexin),Celexa(Citalopram),Flonase(Fluticasone),Gabapentin(Neurontin),Metoprolol(Toprol),Nexium(Esomeprazole),and Tramadol(Ultram-if needed)               Stop taking your Coumadin and Effient as instructed. Also stop taking your Fish Oil. No Goody's,BC's,Aleve,Ibuprofen,or any Herbal Medications   Do not wear jewelry  Do not wear lotions, powders, or colognes. You may wear deodorant.  Men may shave face and neck.  Do not bring valuables to the hospital.  [facility] not responsible                  for any belongings or valuables.               Contacts, dentures or bridgework may not be worn into surgery.  Leave suitcase in the car. After surgery it may be brought to your room.  For patients admitted to the hospital, discharge time is determined by your                treatment team.               Patients discharged the day of surgery will not be allowed to drive  home.    Special Instructions:  [place] - Preparing for Surgery  Before surgery, you can play an important role.  Because skin is not sterile, your skin needs to be as free of germs as possible.  You can reduce the number of germs on you skin by washing with CHG (chlorahexidine gluconate) soap before surgery.  CHG is an antiseptic cleaner which kills germs and bonds with the skin to continue killing germs even after washing.  Please DO NOT use if you have an allergy to CHG or antibacterial soaps.  If your skin becomes reddened/irritated stop using the CHG and inform your nurse when you arrive at Short Stay.  Do not shave (including legs and underarms) for at least 48 hours  prior to the first CHG shower.  You may shave your face.  Please follow these instructions carefully:   1.  Shower with CHG Soap the night before surgery and the                                morning of Surgery.  2.  If you choose to wash your hair, wash your hair first as usual with your       normal shampoo.  3.  After you shampoo, rinse your hair and body thoroughly to remove the                      Shampoo.  4.  Use CHG as you would any other liquid soap.  You can apply chg directly       to the skin and wash gently with scrungie or a clean washcloth.  5.  Apply the CHG Soap to your body ONLY FROM THE NECK DOWN.        Do not use on open wounds or open sores.  Avoid contact with your eyes,  ears, mouth and genitals (private parts).  Wash genitals (private parts)       with your normal soap.  6.  Wash thoroughly, paying special attention to the area where your surgery        will be performed.  7.  Thoroughly rinse your body with warm water from the neck down.  8.  DO NOT shower/wash with your normal soap after using and rinsing off       the CHG Soap.  9.  Pat yourself dry with a clean towel.            10.  Wear clean pajamas.            11.  Place clean sheets on your bed the night of your first shower and do not        sleep with pets.  Day of Surgery  Do not apply any lotions/deoderants the morning of surgery.  Please wear clean clothes to the hospital/surgery center.     Please read over the following fact sheets that you were given: Pain Booklet, Coughing and Deep Breathing and Surgical Site Infection Prevention

## 2013-12-25 ENCOUNTER — Telehealth: Payer: Self-pay | Admitting: Cardiovascular Disease

## 2013-12-25 ENCOUNTER — Encounter (HOSPITAL_COMMUNITY)
Admission: RE | Admit: 2013-12-25 | Discharge: 2013-12-25 | Disposition: A | Discharge status: Home / Self Care | Payer: BC Managed Care – PPO | Source: Ambulatory Visit | Attending: Orthopedic Surgery | Admitting: Orthopedic Surgery

## 2013-12-25 ENCOUNTER — Encounter (HOSPITAL_COMMUNITY)
Admission: RE | Admit: 2013-12-25 | Discharge: 2013-12-25 | Disposition: A | Discharge status: Home / Self Care | Payer: BC Managed Care – PPO | Source: Ambulatory Visit | Attending: Anesthesiology | Admitting: Anesthesiology

## 2013-12-25 ENCOUNTER — Encounter (HOSPITAL_COMMUNITY): Payer: Self-pay

## 2013-12-25 ENCOUNTER — Encounter (HOSPITAL_COMMUNITY): Payer: Self-pay | Admitting: Pharmacy Technician

## 2013-12-25 DIAGNOSIS — Z01812 Encounter for preprocedural laboratory examination: Secondary | ICD-10-CM | POA: Insufficient documentation

## 2013-12-25 DIAGNOSIS — Z01818 Encounter for other preprocedural examination: Secondary | ICD-10-CM | POA: Insufficient documentation

## 2013-12-25 HISTORY — DX: Unspecified osteoarthritis, unspecified site: M19.90

## 2013-12-25 HISTORY — DX: Personal history of colonic polyps: Z86.010

## 2013-12-25 HISTORY — DX: Pain in unspecified joint: M25.50

## 2013-12-25 HISTORY — DX: Edema, unspecified: R60.9

## 2013-12-25 HISTORY — DX: Personal history of colon polyps, unspecified: Z86.0100

## 2013-12-25 HISTORY — DX: Nausea with vomiting, unspecified: R11.2

## 2013-12-25 HISTORY — DX: Personal history of urinary calculi: Z87.442

## 2013-12-25 HISTORY — DX: Other specified postprocedural states: Z98.890

## 2013-12-25 HISTORY — DX: Localized edema: R60.0

## 2013-12-25 HISTORY — DX: Other seasonal allergic rhinitis: J30.2

## 2013-12-25 HISTORY — DX: Polyneuropathy, unspecified: G62.9

## 2013-12-25 LAB — BASIC METABOLIC PANEL
BUN: 29 mg/dL — AB (ref 6–23)
CO2: 23 mEq/L (ref 19–32)
Calcium: 10.1 mg/dL (ref 8.4–10.5)
Chloride: 104 mEq/L (ref 96–112)
Creatinine, Ser: 0.95 mg/dL (ref 0.50–1.35)
GFR calc non Af Amer: 89 mL/min — ABNORMAL LOW (ref >90)
Glucose, Bld: 156 mg/dL — ABNORMAL HIGH (ref 70–99)
Potassium: 5.5 mEq/L — ABNORMAL HIGH (ref 3.7–5.3)
Sodium: 141 mEq/L (ref 137–147)

## 2013-12-25 LAB — CBC
HCT: 36.9 % — ABNORMAL LOW (ref 39.0–52.0)
Hemoglobin: 12.4 g/dL — ABNORMAL LOW (ref 13.0–17.0)
MCH: 28.7 pg (ref 26.0–34.0)
MCHC: 33.6 g/dL (ref 30.0–36.0)
MCV: 85.4 fL (ref 78.0–100.0)
PLATELETS: 225 10*3/uL (ref 150–400)
RBC: 4.32 MIL/uL (ref 4.22–5.81)
RDW: 14.7 % (ref 11.5–15.5)
WBC: 4.8 10*3/uL (ref 4.0–10.5)

## 2013-12-25 LAB — APTT: aPTT: 33 seconds (ref 24–37)

## 2013-12-25 LAB — PROTIME-INR
INR: 1.54 — AB (ref 0.00–1.49)
PROTHROMBIN TIME: 18.1 s — AB (ref 11.6–15.2)

## 2013-12-25 NOTE — Telephone Encounter (Signed)
Spoke with Sherri at Dr. Sherene SiresWainer's office regarding surgical clearance.  I advised Sherri that per Dr. Excell Seltzerooper, patient is to be seen in the office for clearance as he has not been in approximately 6 months.  Dr. Excell Seltzerooper also noted that patient's Effient needs to be held 7 days prior to surgery.  Sherri asked me to fax the clearance form back to her so that she can make Dr. Thurston HoleWainer aware.  I faxed form to [phone]

## 2013-12-25 NOTE — Progress Notes (Addendum)
  Dr.Cooper is Development worker, international aidCardiologist and per note in epic pt has to be seen before clearance given  Several echo reports in epic most recent one being 2010  Stress test in epic from 2007  Heart cath reports in epic from 2005/2009  Sleep study in epic from 2006  EKG in epic from 01-09-13  Medical Md is Dr.Bruce Swords

## 2013-12-25 NOTE — Progress Notes (Signed)
Fasting blood sugar 170-180

## 2013-12-25 NOTE — Telephone Encounter (Signed)
New message     Pt having knee surgery Monday--need clearance faxed to [phone].  Pt is on coumadin and may need to be bridged to lovonox.

## 2013-12-26 ENCOUNTER — Ambulatory Visit: Payer: BC Managed Care – PPO

## 2013-12-26 ENCOUNTER — Encounter (HOSPITAL_COMMUNITY): Payer: Self-pay | Admitting: Vascular Surgery

## 2013-12-26 NOTE — Progress Notes (Signed)
Anesthesia chart review: Patient is a 63 year old male scheduled for right knee arthroscopy with lateral meniscectomy, repair quadricep tendon on 12/30/2013 by Dr. Thurston HoleWainer.  History includes nonsmoker, postoperative nausea and vomiting, CAD with DES RCA X 2 '05 with angioplasty within Taxus stent 7/11/1 and Promus X 2 to mid-distal RCA lesion 07/14/10 and DES CX '09, obstructive sleep apnea, factor V Leiden deficiency, PE 12/2008, hypertension, GERD, diabetes mellitus type 2 with peripheral neuropathy, nephrolithiasis, depression, anemia, psoriasis, esophageal stricture. BMI is 38 consistent with obesity. PCP is Dr. Birdie SonsBruce Swords.  Cardiologist has been Dr. Riley KillStuckey, but is now scheduled to be Dr. Excell Seltzerooper.  Dr. Excell Seltzerooper will not clear him without a preoperative visit.  EKG on 01/09/13 showed NSR, LAD, possible lateral infarct (age undetermined).  Echo on 12/05/12 showed: - Left ventricle: The cavity size was normal. Wall thickness was increased in a pattern of moderate LVH. Systolic function was normal. The estimated ejection fraction was in the range of 60% to 65%. Wall motion was normal; there were no regional wall motion abnormalities. Features are consistent with a pseudonormal left ventricular filling pattern, with concomitant abnormal relaxation and increased filling pressure (grade 2 diastolic dysfunction). - Atrial septum: No defect or patent foramen ovale was identified. - Pulmonary arteries: PA peak pressure: 33mm Hg (S).  Cardiac cath on 07/14/10 (found under Media tab) showed: Left main was free of significant disease. LAD gave rise to large diagonal branch and several septal perforators. 30% proximal LAD with irregularities in the proximal and mid LAD. Circumflex gave rise to a ramus branch, 2 small marginal branches, and atrial branch, and a large posterolateral branch. 30% proximal circumflex. The stent in the mid vessel was widely patent with 0% stenosis. RCA was a large dominant vessel which gave  rise to a conus branch, right ventricular branch, posterior descending branch, and 2 posterolateral branches. There were overlapping Taxus stents in the mid-vessel. Distal to the stent there was a tight 95% stenosis. There was segmental disease extending from within the Taxus stent to about 10 mm before the posterior descending branch. Posterior descending and 2 posterior lateral branches were free of significant obstruction. He underwent successful PCI of the lesion in the mid-to distal right coronary artery using 2 overlapping Promusy stents with central narrowing from 95% to 0%.   CXR on 12/25/13 showed no acute cardiopulmonary disease.  Left basilar scarring.  Preoperative labs noted.    As of yesterday, Dr. Excell Seltzerooper would not clear patient without an office visit, so I am anticipating that case will ultimately be rescheduled unless he can be seen and cleared before 12/30/13. Anticoagulation instructions also need to be clarified.  Velna Ochsllison Tasha Jindra, PA-C [facility]/Anesthesiology Phone [phone] 12/26/2013 9:30 AM

## 2013-12-28 ENCOUNTER — Other Ambulatory Visit: Payer: Self-pay | Admitting: Cardiovascular Disease

## 2013-12-30 ENCOUNTER — Encounter (HOSPITAL_COMMUNITY): Admission: RE | Payer: Self-pay | Source: Ambulatory Visit

## 2013-12-30 ENCOUNTER — Ambulatory Visit (HOSPITAL_COMMUNITY)
Admission: RE | Admit: 2013-12-30 | Payer: BC Managed Care – PPO | Source: Ambulatory Visit | Admitting: Orthopedic Surgery

## 2013-12-30 ENCOUNTER — Ambulatory Visit (INDEPENDENT_AMBULATORY_CARE_PROVIDER_SITE_OTHER): Payer: BC Managed Care – PPO | Admitting: General Practice

## 2013-12-30 DIAGNOSIS — I2699 Other pulmonary embolism without acute cor pulmonale: Secondary | ICD-10-CM

## 2013-12-30 DIAGNOSIS — Z5181 Encounter for therapeutic drug level monitoring: Secondary | ICD-10-CM

## 2013-12-30 LAB — POCT INR: INR: 1.6

## 2013-12-30 SURGERY — ARTHROSCOPY, KNEE, WITH LATERAL MENISCECTOMY
Anesthesia: General | Site: Knee | Laterality: Right

## 2013-12-30 NOTE — Progress Notes (Signed)
Pre visit review using our clinic review tool, if applicable. No additional management support is needed unless otherwise documented below in the visit note. 

## 2013-12-31 NOTE — Telephone Encounter (Signed)
Pt scheduled for appointment with Dr Excell Seltzerooper on 01/06/14 for surgical clearance.

## 2014-01-02 NOTE — Telephone Encounter (Signed)
Message copied by [name] ColumbusBOYD, Yannick Steuber D on Thu Jan 02, 2014  8:14 AM ------      Message from: Tonny BollmanOOPER, MICHAEL      Created: Wed Jan 01, 2014  8:38 PM       Yes - he should be bridged. I'm seeing him 4/13 probably for preop eval. thx      ----- Message -----         From: [name] Columbusynthia D Herman Mell, RN         Sent: 12/30/2013   9:09 AM           To: Tonny BollmanMichael Cooper, MD            Patient will be having knee surgery soon and will need to stop coumadin of course.  Do you  Want him bridged with Lovenox when the time comes?            Thanks!      Bailey Mechindy Tyrena Gohr, Rn      [facility]       ------

## 2014-01-04 ENCOUNTER — Other Ambulatory Visit: Payer: Self-pay | Admitting: Cardiovascular Disease

## 2014-01-04 ENCOUNTER — Other Ambulatory Visit: Payer: Self-pay | Admitting: Family

## 2014-01-06 ENCOUNTER — Ambulatory Visit (INDEPENDENT_AMBULATORY_CARE_PROVIDER_SITE_OTHER): Payer: BC Managed Care – PPO | Admitting: Cardiovascular Disease

## 2014-01-06 ENCOUNTER — Encounter: Payer: Self-pay | Admitting: Cardiovascular Disease

## 2014-01-06 ENCOUNTER — Other Ambulatory Visit (INDEPENDENT_AMBULATORY_CARE_PROVIDER_SITE_OTHER): Payer: BC Managed Care – PPO

## 2014-01-06 VITALS — BP 136/74 | HR 87 | Ht 72.0 in | Wt 273.0 lb

## 2014-01-06 DIAGNOSIS — E785 Hyperlipidemia, unspecified: Secondary | ICD-10-CM

## 2014-01-06 DIAGNOSIS — IMO0001 Reserved for inherently not codable concepts without codable children: Secondary | ICD-10-CM

## 2014-01-06 DIAGNOSIS — I10 Essential (primary) hypertension: Secondary | ICD-10-CM

## 2014-01-06 DIAGNOSIS — I2510 Atherosclerotic heart disease of native coronary artery without angina pectoris: Secondary | ICD-10-CM

## 2014-01-06 DIAGNOSIS — E1165 Type 2 diabetes mellitus with hyperglycemia: Principal | ICD-10-CM

## 2014-01-06 LAB — MICROALBUMIN / CREATININE URINE RATIO
CREATININE, U: 140.8 mg/dL
MICROALB UR: 3 mg/dL — AB (ref 0.0–1.9)
Microalb Creat Ratio: 2.1 mg/g (ref 0.0–30.0)

## 2014-01-06 LAB — HEPATIC FUNCTION PANEL
ALT: 38 U/L (ref 0–53)
AST: 31 U/L (ref 0–37)
Albumin: 3.7 g/dL (ref 3.5–5.2)
Alkaline Phosphatase: 111 U/L (ref 39–117)
BILIRUBIN DIRECT: 0.1 mg/dL (ref 0.0–0.3)
Total Bilirubin: 0.6 mg/dL (ref 0.3–1.2)
Total Protein: 7.5 g/dL (ref 6.0–8.3)

## 2014-01-06 LAB — LIPID PANEL
Cholesterol: 174 mg/dL (ref 0–200)
HDL: 35.1 mg/dL — ABNORMAL LOW (ref >39.00)
LDL CALC: 102 mg/dL — AB (ref 0–99)
Total CHOL/HDL Ratio: 5
Triglycerides: 187 mg/dL — ABNORMAL HIGH (ref 0.0–149.0)
VLDL: 37.4 mg/dL (ref 0.0–40.0)

## 2014-01-06 LAB — HEMOGLOBIN A1C: Hgb A1c MFr Bld: 7.6 % — ABNORMAL HIGH (ref 4.6–6.5)

## 2014-01-06 NOTE — Progress Notes (Signed)
HPI:  [name] is a 63 year old gentleman returning for followup evaluation. He has a history of coronary artery disease status post multiple PCI procedures. He's been treated with both first and second generation drug-eluting stents. His most recent PCI was in 2011. He has multiple stents in the right coronary artery and also has undergone stenting of the left circumflex. He has a history of pulmonary embolus and is heterozygous for factor V Leiden. He's been maintained on long-term anticoagulation. Because of Plavix hyperresponsiveness and recurrent ACS presentations, he was switched to low-dose effient 5 mg and has done well on this combination of effient and warfarin without aspirin. Last lipids 07/05/2013: Lipid Panel     Component Value Date/Time   CHOL 187 07/05/2013 0913   TRIG 301.0* 07/05/2013 0913   HDL 30.70* 07/05/2013 0913   CHOLHDL 6 07/05/2013 0913   VLDL 60.2* 07/05/2013 0913   LDLCALC  Value: UNABLE TO CALCULATE IF TRIGLYCERIDE OVER 400 mg/dL        Total Cholesterol/HDL:CHD Risk Coronary Heart Disease Risk Table                     Men   Women  1/2 Average Risk   3.4   3.3  Average Risk       5.0   4.4  2 X Average Risk   9.6   7.1  3 X Average Risk  23.4   11.0        Use the calculated Patient Ratio above and the CHD Risk Table to determine the patient's CHD Risk.        ATP III CLASSIFICATION (LDL):  <100     mg/dL   Optimal  [id number]  mg/dL   Near or Above                    Optimal  130-159  mg/dL   Borderline  [id number]  mg/dL   High  >811>190     mg/dL   Very High 9/14/78297/07/2010 [id number]   Patient is doing well from a cardiac perspective. He has no symptoms of angina. He specifically denies chest pain or pressure. He denies palpitations, dyspnea, orthopnea, or PND.  He had planned on right knee surgery after an injury. However, his knee has started to improve with conservative measures and he has decided not to have surgery. No other interval events since his last office visit  noted.  Outpatient Encounter Prescriptions as of 01/06/2014  Medication Sig  . atorvastatin (LIPITOR) 40 MG tablet Take 40 mg by mouth daily.  Marland Kitchen. BAYER CONTOUR TEST test strip   . chlorthalidone (HYGROTON) 25 MG tablet TAKE 1 TABLET BY MOUTH EVERY DAY  . citalopram (CELEXA) 20 MG tablet Take 20 mg by mouth daily.  . Clobetasol Prop Emollient Base (CLOBETASOL PROPIONATE E) 0.05 % emollient cream Apply 1 application topically 2 (two) times daily.  Marland Kitchen. enoxaparin (LOVENOX) 120 MG/0.8ML injection   . esomeprazole (NEXIUM) 40 MG capsule Take 40 mg by mouth daily at 12 noon.  . fish oil-omega-3 fatty acids 1000 MG capsule Take 3 g by mouth daily.   . fluticasone (FLONASE) 50 MCG/ACT nasal spray Place 2 sprays into both nostrils daily.  . furosemide (LASIX) 20 MG tablet Take 20 mg by mouth as needed.  Marland Kitchen. glipiZIDE (GLUCOTROL) 10 MG tablet TAKE 1 TABLET BY MOUTH TWICE A DAY BEFORE A MEAL  . HYDROcodone-acetaminophen (NORCO/VICODIN) 5-325 MG per tablet   . LEVEMIR FLEXTOUCH  100 UNIT/ML Pen INJECT 50 UNITS SUBCUTANEOULSY TWICE A DAY AS DIRECTED  . metoprolol succinate (TOPROL-XL) 25 MG 24 hr tablet Take 25 mg by mouth daily.  . nitroGLYCERIN (NITROSTAT) 0.4 MG SL tablet Place 1 tablet (0.4 mg total) under the tongue every 5 (five) minutes as needed.  Marland Kitchen. NOVOLOG 100 UNIT/ML injection INJECT 44 UNITS INTO THE SKIN 3 (THREE) TIMES DAILY BEFORE MEALS.  Marland Kitchen. nystatin cream (MYCOSTATIN) APPLY 1 APPLICATION TOPICALLY 2 (TWO) TIMES DAILY AS NEEDED FOR DRY SKIN.  Marland Kitchen. olmesartan (BENICAR) 40 MG tablet Take 40 mg by mouth daily.  . Potassium Citrate 15 MEQ (1620 MG) TBCR   . prasugrel (EFFIENT) 5 MG TABS tablet Take 5 mg by mouth daily.  Marland Kitchen. warfarin (COUMADIN) 5 MG tablet Take as directed by anticoagulation clinic  . [DISCONTINUED] esomeprazole (NEXIUM) 40 MG capsule Take 40 mg by mouth daily at 12 noon.  . [DISCONTINUED] gabapentin (NEURONTIN) 100 MG capsule Take 1 capsule (100 mg total) by mouth 3 (three) times daily.   . [DISCONTINUED] metFORMIN (GLUCOPHAGE) 1000 MG tablet Take 1,000 mg by mouth 2 (two) times daily with a meal.    Allergies  Allergen Reactions  . Piroxicam     PHELDENE REACTION: unspecified: blistering on hands  . Rofecoxib     REACTION: Reaction not known    Past Medical History  Diagnosis Date  . HYPERLIPIDEMIA     takes Atorvastatin daily  . CORONARY ARTERY DISEASE 04/12/2007  . SLEEP APNEA, OBSTRUCTIVE 01/16/2009  . ESOPHAGEAL STRICTURE 01/16/2009  . PSORIASIS     uses a cream  . PULMONARY EMBOLISM     takes Coumadin daily  . 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 NEPHROLITHIASIS, HX OF 02/27/2009  . ANEMIA-UNSPECIFIED 04/28/2010  . Coronary atherosclerosis of unspecified type of vessel, native or graft     s/p DES to RCA x2 in 2005; s/p DES to CFX 2009; s/p DES PTCA of RCA for  in stent restenosis 03/2010; s/p Promus DES x2 07/16/10(recurrent pain with abnormal Myoview oct 18,2011; residual at cath 07/16/10: LAD 30%; CFX 30%; RCA 30% ISR.  Marland Kitchen. Leukopenia 11/11/2011  . DEPRESSION     takes Celexa daily  . HYPERTENSION     takes Benicar and Metoprolol daily  . GERD     takes Nexium daily  . PONV (postoperative nausea and vomiting)   . Peripheral edema     takes Chlorthalidone daily  . Peripheral neuropathy   . Arthritis   . Joint pain   . Seasonal allergies     takes Zyrtec daily  . History of colon polyps   . History of kidney stones   . DIABETES MELLITUS, TYPE II, UNCONTROLLED 06/08/2007    Novolog and Levemir daily as well as Metformin and Glipizide  . DIABETES MELLITUS-TYPE II 09/01/2010    ROS: Negative except as per HPI  BP 136/74  Pulse 87  Ht 6' (1.829 m)  Wt 273 lb (123.832 kg)  BMI 37.02 kg/m2  PHYSICAL EXAM: Pt is alert and oriented, pleasant obese male in NAD HEENT: normal Neck: JVP - normal, carotids 2+= without bruits Lungs: CTA bilaterally CV: RRR without murmur or gallop Abd: soft, NT, Positive BS, no hepatomegaly Ext: no C/C/E, distal pulses intact  and equal Skin: warm/dry no rash  EKG:  Normal sinus rhythm 87 beats per minute, left axis deviation, otherwise within normal limits.  ASSESSMENT AND PLAN: 1. Coronary artery disease, native vessel. The patient is stable without symptoms of angina. Will continue  him on low-dose effient in the setting of chronic warfarin use. Will continue his other medications without changes.  2. Hyperlipidemia. The patient will continue on atorvastatin. Will repeat lipids and LFTs prior to his 6 month followup exam.  3. Hypertension. Blood pressure well controlled on a combination of metoprolol succinate and Benicar.  4. History of pulmonary embolus with factor V Leiden. He maintains on long-term anticoagulation with warfarin.  Tonny Bollman 01/06/2014 12:12 PM

## 2014-01-06 NOTE — Patient Instructions (Signed)
Your physician wants you to follow-up in: 6 MONTHS with Dr Excell Seltzerooper.  You will receive a reminder letter in the mail two months in advance. If you don't receive a letter, please call our office to schedule the follow-up appointment.  Your physician recommends that you return for a FASTING LIPID and LIVER profile --nothing to eat or drink after midnight, lab opens at 7:30 AM  Your physician recommends that you continue on your current medications as directed. Please refer to the Current Medication list given to you today.

## 2014-01-07 ENCOUNTER — Other Ambulatory Visit: Payer: Self-pay | Admitting: Cardiovascular Disease

## 2014-01-12 NOTE — Assessment & Plan Note (Signed)
Lab Results  Component Value Date   HGBA1C 7.6* 01/06/2014   Reasonable control Continue same meds

## 2014-01-12 NOTE — Assessment & Plan Note (Signed)
Lipid Panel     Component Value Date/Time   CHOL 174 01/06/2014 0911   TRIG 187.0* 01/06/2014 0911   HDL 35.10* 01/06/2014 0911   CHOLHDL 5 01/06/2014 0911   VLDL 37.4 01/06/2014 0911   LDLCALC 102* 01/06/2014 0911   a1c not at goal-

## 2014-01-12 NOTE — Assessment & Plan Note (Signed)
No sxs Reviewed Dr. Earmon Phoenixooper's note

## 2014-01-12 NOTE — Assessment & Plan Note (Signed)
Factor V leiden- lifelong warfarin

## 2014-01-12 NOTE — Progress Notes (Signed)
CAD- reviewed Dr. Earmon Phoenixooper's note  DM- home cbgs are > 140.Marland Kitchen. Usually >200  Lipids- toerating meds  Factor V Leiden deficiency- on life long warfarin  Past Medical History  Diagnosis Date  . HYPERLIPIDEMIA     takes Atorvastatin daily  . CORONARY ARTERY DISEASE 04/12/2007  . SLEEP APNEA, OBSTRUCTIVE 01/16/2009  . ESOPHAGEAL STRICTURE 01/16/2009  . PSORIASIS     uses a cream  . PULMONARY EMBOLISM     takes Coumadin daily  . 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 NEPHROLITHIASIS, HX OF 02/27/2009  . ANEMIA-UNSPECIFIED 04/28/2010  . Coronary atherosclerosis of unspecified type of vessel, native or graft     s/p DES to RCA x2 in 2005; s/p DES to CFX 2009; s/p DES PTCA of RCA for  in stent restenosis 03/2010; s/p Promus DES x2 07/16/10(recurrent pain with abnormal Myoview oct 18,2011; residual at cath 07/16/10: LAD 30%; CFX 30%; RCA 30% ISR.  Marland Kitchen. Leukopenia 11/11/2011  . DEPRESSION     takes Celexa daily  . HYPERTENSION     takes Benicar and Metoprolol daily  . GERD     takes Nexium daily  . PONV (postoperative nausea and vomiting)   . Peripheral edema     takes Chlorthalidone daily  . Peripheral neuropathy   . Arthritis   . Joint pain   . Seasonal allergies     takes Zyrtec daily  . History of colon polyps   . History of kidney stones   . DIABETES MELLITUS, TYPE II, UNCONTROLLED 06/08/2007    Novolog and Levemir daily as well as Metformin and Glipizide  . DIABETES MELLITUS-TYPE II 09/01/2010    History   Social History  . Marital Status: Married    Spouse Name: N/A    Number of Children: N/A  . Years of Education: N/A   Occupational History  . Not on file.   Social History Main Topics  . Smoking status: Never Smoker   . Smokeless tobacco: Never Used  . Alcohol Use: No  . Drug Use: No  . Sexual Activity: Yes   Other Topics Concern  . Not on file   Social History Narrative  . No narrative on file    Past Surgical History  Procedure Laterality Date  . Knee arthroscopy   2007    left knee x 2  . Angioplasty  2005 & 2011  . Esophagogastroduodenoscopy    . Colonoscopy    . Carpal tunnel release Bilateral   . Coronary angioplasty with stent placement  2005/2009    5 stents   . Cystoscopy      x 2    Family History  Problem Relation Age of Onset  . Pulmonary embolism Mother   . Factor V Leiden deficiency Mother   . Diabetes Brother   . Heart failure Brother   . Hypertension Brother     Allergies  Allergen Reactions  . Piroxicam     PHELDENE REACTION: unspecified: blistering on hands  . Rofecoxib     REACTION: Reaction not known    Current Outpatient Prescriptions on File Prior to Visit  Medication Sig Dispense Refill  . atorvastatin (LIPITOR) 40 MG tablet Take 40 mg by mouth daily.      Marland Kitchen. BAYER CONTOUR TEST test strip       . chlorthalidone (HYGROTON) 25 MG tablet TAKE 1 TABLET BY MOUTH EVERY DAY  30 tablet  6  . citalopram (CELEXA) 20 MG tablet Take 20 mg by mouth daily.      .Marland Kitchen  Clobetasol Prop Emollient Base (CLOBETASOL PROPIONATE E) 0.05 % emollient cream Apply 1 application topically 2 (two) times daily.  30 g  0  . enoxaparin (LOVENOX) 120 MG/0.8ML injection       . esomeprazole (NEXIUM) 40 MG capsule Take 40 mg by mouth daily at 12 noon.      . fish oil-omega-3 fatty acids 1000 MG capsule Take 3 g by mouth daily.       . fluticasone (FLONASE) 50 MCG/ACT nasal spray Place 2 sprays into both nostrils daily.  16 g  6  . furosemide (LASIX) 20 MG tablet Take 20 mg by mouth as needed.      Marland Kitchen. glipiZIDE (GLUCOTROL) 10 MG tablet TAKE 1 TABLET BY MOUTH TWICE A DAY BEFORE A MEAL  60 tablet  0  . HYDROcodone-acetaminophen (NORCO/VICODIN) 5-325 MG per tablet       . LEVEMIR FLEXTOUCH 100 UNIT/ML Pen INJECT 50 UNITS SUBCUTANEOULSY TWICE A DAY AS DIRECTED  45 mL  5  . metoprolol succinate (TOPROL-XL) 25 MG 24 hr tablet Take 25 mg by mouth daily.      . metoprolol succinate (TOPROL-XL) 25 MG 24 hr tablet TAKE 1 TABLET BY MOUTH EVERY DAY  30 tablet  5   . nitroGLYCERIN (NITROSTAT) 0.4 MG SL tablet Place 1 tablet (0.4 mg total) under the tongue every 5 (five) minutes as needed.  25 tablet  3  . NOVOLOG 100 UNIT/ML injection INJECT 44 UNITS INTO THE SKIN 3 (THREE) TIMES DAILY BEFORE MEALS.  50 mL  9  . nystatin cream (MYCOSTATIN) APPLY 1 APPLICATION TOPICALLY 2 (TWO) TIMES DAILY AS NEEDED FOR DRY SKIN.  30 g  1  . olmesartan (BENICAR) 40 MG tablet Take 40 mg by mouth daily.      . Potassium Citrate 15 MEQ (1620 MG) TBCR       . prasugrel (EFFIENT) 5 MG TABS tablet Take 5 mg by mouth daily.      Marland Kitchen. warfarin (COUMADIN) 5 MG tablet Take as directed by anticoagulation clinic  35 tablet  3   No current facility-administered medications on file prior to visit.     patient denies chest pain, shortness of breath, orthopnea. Denies lower extremity edema, abdominal pain, change in appetite, change in bowel movements. Patient denies rashes, musculoskeletal complaints (except knee pain from a partially torn tendon)-- was scheduled for surgery No other specific complaints in a complete review of systems.   Reviewed vitals  well-developed well-nourished male in no acute distress. HEENT exam atraumatic, normocephalic, neck supple without jugular venous distention. Chest clear to auscultation cardiac exam S1-S2 are regular. Abdominal exam overweight with bowel sounds, soft and nontender. Extremities no edema. Neurologic exam is alert with a normal gait.

## 2014-01-12 NOTE — Assessment & Plan Note (Signed)
No home bps bp is adequately controlled

## 2014-01-13 ENCOUNTER — Telehealth: Payer: Self-pay | Admitting: Internal Medicine

## 2014-01-13 ENCOUNTER — Ambulatory Visit (INDEPENDENT_AMBULATORY_CARE_PROVIDER_SITE_OTHER): Payer: BC Managed Care – PPO | Admitting: Internal Medicine

## 2014-01-13 ENCOUNTER — Ambulatory Visit (INDEPENDENT_AMBULATORY_CARE_PROVIDER_SITE_OTHER): Payer: BC Managed Care – PPO | Admitting: General Practice

## 2014-01-13 ENCOUNTER — Encounter: Payer: Self-pay | Admitting: Internal Medicine

## 2014-01-13 VITALS — BP 124/64 | HR 98 | Temp 98.1°F | Ht 72.0 in | Wt 271.0 lb

## 2014-01-13 DIAGNOSIS — E119 Type 2 diabetes mellitus without complications: Secondary | ICD-10-CM

## 2014-01-13 DIAGNOSIS — I2699 Other pulmonary embolism without acute cor pulmonale: Secondary | ICD-10-CM

## 2014-01-13 DIAGNOSIS — Z5181 Encounter for therapeutic drug level monitoring: Secondary | ICD-10-CM

## 2014-01-13 DIAGNOSIS — I10 Essential (primary) hypertension: Secondary | ICD-10-CM

## 2014-01-13 DIAGNOSIS — E785 Hyperlipidemia, unspecified: Secondary | ICD-10-CM

## 2014-01-13 DIAGNOSIS — I2510 Atherosclerotic heart disease of native coronary artery without angina pectoris: Secondary | ICD-10-CM

## 2014-01-13 LAB — HM DIABETES FOOT EXAM

## 2014-01-13 LAB — POCT INR: INR: 2.1

## 2014-01-13 MED ORDER — OMEPRAZOLE 20 MG PO CPDR: 20.0000 mg | DELAYED_RELEASE_CAPSULE | Freq: Every day | ORAL | Status: DC

## 2014-01-13 MED ORDER — LOSARTAN POTASSIUM 100 MG PO TABS: 100.0000 mg | ORAL_TABLET | Freq: Every day | ORAL | Status: DC

## 2014-01-13 MED ORDER — ATORVASTATIN CALCIUM 40 MG PO TABS: 40.0000 mg | ORAL_TABLET | Freq: Every day | ORAL | Status: DC

## 2014-01-13 MED ORDER — INSULIN DETEMIR 100 UNIT/ML FLEXPEN: 60.0000 [IU] | PEN_INJECTOR | Freq: Two times a day (BID) | SUBCUTANEOUS | Status: DC

## 2014-01-13 NOTE — Telephone Encounter (Signed)
Pt would like to switch to [facility] instead of Dr. Durene CalHunter.  Is it okay??

## 2014-01-13 NOTE — Progress Notes (Signed)
Pre visit review using our clinic review tool, if applicable. No additional management support is needed unless otherwise documented below in the visit note. 

## 2014-01-13 NOTE — Telephone Encounter (Signed)
Ok with me 

## 2014-01-14 ENCOUNTER — Telehealth: Payer: Self-pay | Admitting: Internal Medicine

## 2014-01-14 NOTE — Telephone Encounter (Signed)
LMOM to notify pt that it is okay to [place] [facility].  He needs to schedule a 6 mth rov.

## 2014-01-14 NOTE — Telephone Encounter (Signed)
ok 

## 2014-01-14 NOTE — Telephone Encounter (Signed)
Relevant patient education assigned to patient using Emmi. ° °

## 2014-01-27 ENCOUNTER — Ambulatory Visit: Payer: BC Managed Care – PPO

## 2014-01-30 ENCOUNTER — Ambulatory Visit: Payer: BC Managed Care – PPO

## 2014-02-08 ENCOUNTER — Other Ambulatory Visit: Payer: Self-pay | Admitting: Internal Medicine

## 2014-02-10 ENCOUNTER — Ambulatory Visit (INDEPENDENT_AMBULATORY_CARE_PROVIDER_SITE_OTHER): Payer: BC Managed Care – PPO | Admitting: General Practice

## 2014-02-10 DIAGNOSIS — I2699 Other pulmonary embolism without acute cor pulmonale: Secondary | ICD-10-CM

## 2014-02-10 DIAGNOSIS — Z5181 Encounter for therapeutic drug level monitoring: Secondary | ICD-10-CM

## 2014-02-10 LAB — POCT INR: INR: 3.1

## 2014-02-24 ENCOUNTER — Other Ambulatory Visit: Payer: Self-pay | Admitting: Family

## 2014-03-10 ENCOUNTER — Ambulatory Visit: Payer: BC Managed Care – PPO

## 2014-03-12 ENCOUNTER — Other Ambulatory Visit: Payer: Self-pay | Admitting: Internal Medicine

## 2014-03-13 ENCOUNTER — Ambulatory Visit (INDEPENDENT_AMBULATORY_CARE_PROVIDER_SITE_OTHER): Payer: BC Managed Care – PPO | Admitting: General Practice

## 2014-03-13 ENCOUNTER — Other Ambulatory Visit (INDEPENDENT_AMBULATORY_CARE_PROVIDER_SITE_OTHER): Payer: BC Managed Care – PPO

## 2014-03-13 DIAGNOSIS — I2699 Other pulmonary embolism without acute cor pulmonale: Secondary | ICD-10-CM

## 2014-03-13 DIAGNOSIS — Z5181 Encounter for therapeutic drug level monitoring: Secondary | ICD-10-CM

## 2014-03-13 DIAGNOSIS — E785 Hyperlipidemia, unspecified: Secondary | ICD-10-CM

## 2014-03-13 LAB — LIPID PANEL
CHOLESTEROL: 186 mg/dL (ref 0–200)
HDL: 32.1 mg/dL — AB (ref >39.00)
LDL Cholesterol: 76 mg/dL (ref 0–99)
NonHDL: 153.9
Total CHOL/HDL Ratio: 6
Triglycerides: 389 mg/dL — ABNORMAL HIGH (ref 0.0–149.0)
VLDL: 77.8 mg/dL — ABNORMAL HIGH (ref 0.0–40.0)

## 2014-03-13 LAB — POCT INR: INR: 2.7

## 2014-03-13 NOTE — Progress Notes (Signed)
**Note De-identified Jarmel Linhardt Obfuscation** Pre visit review using our clinic review tool, if applicable. No additional management support is needed unless otherwise documented below in the visit note. 

## 2014-03-14 ENCOUNTER — Other Ambulatory Visit: Payer: BC Managed Care – PPO

## 2014-03-19 ENCOUNTER — Encounter: Payer: Self-pay | Admitting: Family

## 2014-03-19 ENCOUNTER — Encounter: Payer: Self-pay | Admitting: Internal Medicine

## 2014-04-08 ENCOUNTER — Other Ambulatory Visit: Payer: Self-pay | Admitting: Internal Medicine

## 2014-04-16 ENCOUNTER — Other Ambulatory Visit: Payer: Self-pay | Admitting: Family

## 2014-04-18 ENCOUNTER — Ambulatory Visit (INDEPENDENT_AMBULATORY_CARE_PROVIDER_SITE_OTHER): Payer: BC Managed Care – PPO | Admitting: Family Medicine

## 2014-04-19 ENCOUNTER — Encounter: Payer: Self-pay | Admitting: Internal Medicine

## 2014-04-21 ENCOUNTER — Ambulatory Visit: Payer: BC Managed Care – PPO

## 2014-04-21 ENCOUNTER — Encounter: Payer: Self-pay | Admitting: Physician Assistant

## 2014-04-21 ENCOUNTER — Ambulatory Visit (INDEPENDENT_AMBULATORY_CARE_PROVIDER_SITE_OTHER): Payer: BC Managed Care – PPO | Admitting: Physician Assistant

## 2014-04-21 ENCOUNTER — Ambulatory Visit (INDEPENDENT_AMBULATORY_CARE_PROVIDER_SITE_OTHER): Payer: BC Managed Care – PPO | Admitting: General Practice

## 2014-04-21 VITALS — BP 120/70 | HR 72 | Temp 98.5°F | Resp 18 | Wt 279.0 lb

## 2014-04-21 DIAGNOSIS — R51 Headache: Secondary | ICD-10-CM | POA: Diagnosis not present

## 2014-04-21 DIAGNOSIS — I2699 Other pulmonary embolism without acute cor pulmonale: Secondary | ICD-10-CM

## 2014-04-21 DIAGNOSIS — J0100 Acute maxillary sinusitis, unspecified: Secondary | ICD-10-CM

## 2014-04-21 DIAGNOSIS — Z5181 Encounter for therapeutic drug level monitoring: Secondary | ICD-10-CM

## 2014-04-21 DIAGNOSIS — Z23 Encounter for immunization: Secondary | ICD-10-CM | POA: Diagnosis not present

## 2014-04-21 LAB — POCT INR: INR: 3

## 2014-04-21 MED ORDER — AMOXICILLIN-POT CLAVULANATE 875-125 MG PO TABS: 1.0000 | ORAL_TABLET | Freq: Two times a day (BID) | ORAL | Status: DC

## 2014-04-21 NOTE — Progress Notes (Signed)
Pre visit review using our clinic review tool, if applicable. No additional management support is needed unless otherwise documented below in the visit note. 

## 2014-04-21 NOTE — Progress Notes (Signed)
Subjective:    Patient ID: [name], male    DOB: 1951-08-19, 63 y.o.   MRN: [id number]  Sinusitis This is a new problem. The current episode started 1 to 4 weeks ago (3 weeks). The problem has been gradually worsening since onset. There has been no fever. His pain is at a severity of 5/10. Associated symptoms include congestion, coughing, ear pain, headaches (with some blurred vision occasionally, alternating left and right behind eyes.), sinus pressure (tooth pain.) and a sore throat. Pertinent negatives include no chills, diaphoresis, hoarse voice, neck pain, shortness of breath, sneezing or swollen glands. Past treatments include acetaminophen. The treatment provided no relief.      Review of Systems  Constitutional: Negative for fever, chills and diaphoresis.  HENT: Positive for congestion, ear pain, postnasal drip, sinus pressure (tooth pain.) and sore throat. Negative for hoarse voice and sneezing.   Respiratory: Positive for cough. Negative for shortness of breath.   Cardiovascular: Negative for chest pain.  Gastrointestinal: Negative for nausea, vomiting and diarrhea.  Musculoskeletal: Negative for neck pain.  Neurological: Positive for headaches (with some blurred vision occasionally, alternating left and right behind eyes.). Negative for syncope.  All other systems reviewed and are negative.    Past Medical History  Diagnosis Date  . HYPERLIPIDEMIA     takes Atorvastatin daily  . CORONARY ARTERY DISEASE 04/12/2007  . SLEEP APNEA, OBSTRUCTIVE 01/16/2009  . ESOPHAGEAL STRICTURE 01/16/2009  . PSORIASIS     uses a cream  . PULMONARY EMBOLISM     takes Coumadin daily  . 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 NEPHROLITHIASIS, HX OF 02/27/2009  . ANEMIA-UNSPECIFIED 04/28/2010  . Coronary atherosclerosis of unspecified type of vessel, native or graft     s/p DES to RCA x2 in 2005; s/p DES to CFX 2009; s/p DES PTCA of RCA for  in stent restenosis 03/2010; s/p Promus DES x2  07/16/10(recurrent pain with abnormal Myoview oct 18,2011; residual at cath 07/16/10: LAD 30%; CFX 30%; RCA 30% ISR.  Marland Kitchen Leukopenia 11/11/2011  . DEPRESSION     takes Celexa daily  . HYPERTENSION     takes Benicar and Metoprolol daily  . GERD     takes Nexium daily  . PONV (postoperative nausea and vomiting)   . Peripheral edema     takes Chlorthalidone daily  . Peripheral neuropathy   . Arthritis   . Joint pain   . Seasonal allergies     takes Zyrtec daily  . History of colon polyps   . History of kidney stones   . DIABETES MELLITUS, TYPE II, UNCONTROLLED 06/08/2007    Novolog and Levemir daily as well as Metformin and Glipizide  . DIABETES MELLITUS-TYPE II 09/01/2010    History   Social History  . Marital Status: Married    Spouse Name: N/A    Number of Children: N/A  . Years of Education: N/A   Occupational History  . Not on file.   Social History Main Topics  . Smoking status: Never Smoker   . Smokeless tobacco: Never Used  . Alcohol Use: No  . Drug Use: No  . Sexual Activity: Yes   Other Topics Concern  . Not on file   Social History Narrative  . No narrative on file    Past Surgical History  Procedure Laterality Date  . Knee arthroscopy  2007    left knee x 2  . Angioplasty  2005 & 2011  . Esophagogastroduodenoscopy    .  Colonoscopy    . Carpal tunnel release Bilateral   . Coronary angioplasty with stent placement  2005/2009    5 stents   . Cystoscopy      x 2    Family History  Problem Relation Age of Onset  . Pulmonary embolism Mother   . Factor V Leiden deficiency Mother   . Diabetes Brother   . Heart failure Brother   . Hypertension Brother     Allergies  Allergen Reactions  . Piroxicam     PHELDENE REACTION: unspecified: blistering on hands  . Rofecoxib     REACTION: Reaction not known    Current Outpatient Prescriptions on File Prior to Visit  Medication Sig Dispense Refill  . atorvastatin (LIPITOR) 40 MG tablet Take 1 tablet  (40 mg total) by mouth daily.  90 tablet  3  . BAYER CONTOUR TEST test strip 1 each by Other route 2 (two) times daily.       Marland Kitchen BAYER CONTOUR TEST test strip TEST SUGAR TWICE A DAY  100 each  11  . chlorthalidone (HYGROTON) 25 MG tablet TAKE 1 TABLET BY MOUTH EVERY DAY  30 tablet  6  . citalopram (CELEXA) 20 MG tablet Take 20 mg by mouth daily.      Marland Kitchen CLOBETASOL PROPIONATE E 0.05 % emollient cream APPLY TO AFFECTED AREA TWICE A DAY  30 g  0  . fish oil-omega-3 fatty acids 1000 MG capsule Take 3 g by mouth daily.       . fluticasone (FLONASE) 50 MCG/ACT nasal spray Place 2 sprays into both nostrils daily.  16 g  6  . furosemide (LASIX) 20 MG tablet Take 20 mg by mouth as needed.      . gabapentin (NEURONTIN) 100 MG capsule Take 1 capsule by mouth 3 (three) times daily.      Marland Kitchen gabapentin (NEURONTIN) 100 MG capsule TAKE 1 CAPSULE (100 MG TOTAL) BY MOUTH 3 (THREE) TIMES DAILY.  90 capsule  3  . glipiZIDE (GLUCOTROL) 10 MG tablet TAKE 1 TABLET BY MOUTH TWICE A DAY BEFORE A MEAL  60 tablet  5  . HYDROcodone-acetaminophen (NORCO/VICODIN) 5-325 MG per tablet Take 1 tablet by mouth daily as needed.       . insulin aspart (NOVOLOG) 100 UNIT/ML injection INJECT 44 UNITS INTO THE SKIN 4 TIMES DAILY BEFORE MEALS.      Marland Kitchen Insulin Detemir (LEVEMIR FLEXTOUCH) 100 UNIT/ML Pen Inject 60 Units into the skin 2 (two) times daily. Or as directed  20 pen  11  . losartan (COZAAR) 100 MG tablet Take 1 tablet (100 mg total) by mouth daily.  90 tablet  3  . metoprolol succinate (TOPROL-XL) 25 MG 24 hr tablet TAKE 1 TABLET BY MOUTH EVERY DAY  30 tablet  5  . nitroGLYCERIN (NITROSTAT) 0.4 MG SL tablet Place 1 tablet (0.4 mg total) under the tongue every 5 (five) minutes as needed.  25 tablet  3  . nystatin cream (MYCOSTATIN) APPLY 1 APPLICATION TOPICALLY 2 (TWO) TIMES DAILY AS NEEDED FOR DRY SKIN.  30 g  1  . omeprazole (PRILOSEC) 20 MG capsule Take 1 capsule (20 mg total) by mouth daily.  90 capsule  3  . Potassium Citrate  15 MEQ (1620 MG) TBCR       . prasugrel (EFFIENT) 5 MG TABS tablet Take 5 mg by mouth daily.      Marland Kitchen warfarin (COUMADIN) 5 MG tablet TAKE AS DIRECTED BY ANTICOAGULATION CLINIC  35 tablet  3   No current facility-administered medications on file prior to visit.    EXAM: BP 120/70  Pulse 72  Temp(Src) 98.5 F (36.9 C) (Oral)  Resp 18  Wt 279 lb (126.554 kg)     Objective:   Physical Exam  Nursing note and vitals reviewed. Constitutional: He is oriented to person, place, and time. He appears well-developed and well-nourished. No distress.  HENT:  Head: Normocephalic and atraumatic.  Right Ear: External ear normal.  Left Ear: External ear normal.  Nose: Nose normal.  Mouth/Throat: No oropharyngeal exudate.  Oropharynx is slightly erythematous, no exudate. Bilateral TMs normal. Bilateral frontal sinuses non-TTP. Bilat max sinus TTP.   Eyes: Conjunctivae and EOM are normal. Pupils are equal, round, and reactive to light.  Vision grossly intact bilat.  Neck: Neck supple.  Cardiovascular: Normal rate, regular rhythm and intact distal pulses.   Pulmonary/Chest: Effort normal and breath sounds normal. No stridor. No respiratory distress. He has no wheezes. He has no rales. He exhibits no tenderness.  Lymphadenopathy:    He has no cervical adenopathy.  Neurological: He is alert and oriented to person, place, and time.  Skin: Skin is warm and dry. No rash noted. He is not diaphoretic. No erythema. No pallor.  Psychiatric: He has a normal mood and affect. His behavior is normal. Judgment and thought content normal.      Lab Results  Component Value Date   WBC 4.8 12/25/2013   HGB 12.4* 12/25/2013   HCT 36.9* 12/25/2013   PLT 225 12/25/2013   GLUCOSE 156* 12/25/2013   CHOL 186 03/13/2014   TRIG 389.0* 03/13/2014   HDL 32.10* 03/13/2014   LDLDIRECT 113.9 07/05/2013   LDLCALC 76 03/13/2014   ALT 38 01/06/2014   AST 31 01/06/2014   NA 141 12/25/2013   K 5.5* 12/25/2013   CL 104 12/25/2013    CREATININE 0.95 12/25/2013   BUN 29* 12/25/2013   CO2 23 12/25/2013   TSH 1.48 08/04/2011   PSA 0.22 08/04/2011   INR 2.7 03/13/2014   HGBA1C 7.6* 01/06/2014   MICROALBUR 3.0* 01/06/2014        Assessment & Plan:  Aasim was seen today for sinusitis.  Diagnoses and associated orders for this visit:  Headache(784.0) Comments: Has HA usually with sinus infection, however this is new type. Watchful waiting for imrovement with tx of sinusitis. CT of head if no improvment in 1 week.  Acute maxillary sinusitis, recurrence not specified Comments: Will treat due to symptoms and length of illness. Tx with augmentin. rest and fluid hydration. - amoxicillin-clavulanate (AUGMENTIN) 875-125 MG per tablet; Take 1 tablet by mouth 2 (two) times daily.  Need for prophylactic vaccination against Streptococcus pneumoniae (pneumococcus) - Pneumococcal conjugate vaccine 13-valent    Patient normally has headaches with sinus infections, and headaches are located frontally, however he believes these are different than his usual sinus related headaches. Will follow closely to assure that both headaches and sinus infection are resolving. If headaches persist, will obtain CT of head.  Return precautions provided, and patient handout on headache and sinusitis.  Plan to follow up in about 1 week to reassess, or for worsening or persistent symptoms despite treatment.  Patient Instructions  If headache is still present despite 1 week of treating sinus infection, return to clinic for reevaluation.  Continue taking tylenol as needed to help symptoms.  Augmentin twice daily for 10 days to treat sinus infection.  Plain Over the Counter Mucinex (NOT Mucinex D) for thick  secretions  Force NON dairy fluids, drinking plenty of water is best.    Over the Counter Flonase OR Nasacort AQ 1 spray in each nostril twice a day as needed. Use the "crossover" technique into opposite nostril spraying toward opposite ear @ 45  degree angle, not straight up into nostril.   Plain Over the Counter Allegra (NOT D )  160 daily , OR Loratidine 10 mg , OR Zyrtec 10 mg @ bedtime  as needed for itchy eyes & sneezing.  Saline Irrigation and Saline Sprays can also help reduce symptoms.  If emergency symptoms discussed during visit developed, seek medical attention immediately.  Followup in about 1 week to reassess, or for worsening or persistent symptoms despite treatment.

## 2014-04-21 NOTE — Patient Instructions (Addendum)
If headache is still present despite 1 week of treating sinus infection, return to clinic for reevaluation.  Continue taking tylenol as needed to help symptoms.  Augmentin twice daily for 10 days to treat sinus infection.  Plain Over the Counter Mucinex (NOT Mucinex D) for thick secretions  Force NON dairy fluids, drinking plenty of water is best.    Over the Counter Flonase OR Nasacort AQ 1 spray in each nostril twice a day as needed. Use the "crossover" technique into opposite nostril spraying toward opposite ear @ 45 degree angle, not straight up into nostril.   Plain Over the Counter Allegra (NOT D )  160 daily , OR Loratidine 10 mg , OR Zyrtec 10 mg @ bedtime  as needed for itchy eyes & sneezing.  Saline Irrigation and Saline Sprays can also help reduce symptoms.  If emergency symptoms discussed during visit developed, seek medical attention immediately.  Followup in about 1 week to reassess, or for worsening or persistent symptoms despite treatment.    Sinusitis Sinusitis is redness, soreness, and puffiness (inflammation) of the air pockets in the bones of your face (sinuses). The redness, soreness, and puffiness can cause air and mucus to get trapped in your sinuses. This can allow germs to grow and cause an infection.  HOME CARE   Drink enough fluids to keep your pee (urine) clear or pale yellow.  Use a humidifier in your home.  Run a hot shower to create steam in the bathroom. Sit in the bathroom with the door closed. Breathe in the steam 3-4 times a day.  Put a warm, moist washcloth on your face 3-4 times a day, or as told by your doctor.  Use salt water sprays (saline sprays) to wet the thick fluid in your nose. This can help the sinuses drain.  Only take medicine as told by your doctor. GET HELP RIGHT AWAY IF:   Your pain gets worse.  You have very bad headaches.  You are sick to your stomach (nauseous).  You throw up (vomit).  You are very sleepy (drowsy)  all the time.  Your face is puffy (swollen).  Your vision changes.  You have a stiff neck.  You have trouble breathing. MAKE SURE YOU:   Understand these instructions.  Will watch your condition.  Will get help right away if you are not doing well or get worse. Document Released: 02/29/2008 Document Revised: 06/06/2012 Document Reviewed: 04/17/2012 [facility] Patient Information 2015 [place], [place]. This information is not intended to replace advice given to you by your health care provider. Make sure you discuss any questions you have with your health care provider. General Headache Without Cause A general headache is pain or discomfort felt around the head or neck area. The cause may not be found.  HOME CARE   Keep all doctor visits.  Only take medicines as told by your doctor.  Lie down in a dark, quiet room when you have a headache.  Keep a journal to find out if certain things bring on headaches. For example, write down:  What you eat and drink.  How much sleep you get.  Any change to your diet or medicines.  Relax by getting a massage or doing other relaxing activities.  Put ice or heat packs on the head and neck area as told by your doctor.  Lessen stress.  Sit up straight. Do not tighten (tense) your muscles.  Quit smoking if you smoke.  Lessen how much alcohol you drink.  Lessen how much caffeine you drink, or stop drinking caffeine.  Eat and sleep on a regular schedule.  Get 7 to 9 hours of sleep, or as told by your doctor.  Keep lights dim if bright lights bother you or make your headaches worse. GET HELP RIGHT AWAY IF:   Your headache becomes really bad.  You have a fever.  You have a stiff neck.  You have trouble seeing.  Your muscles are weak, or you lose muscle control.  You lose your balance or have trouble walking.  You feel like you will pass out (faint), or you pass out.  You have really bad symptoms that are different than your  first symptoms.  You have problems with the medicines given to you by your doctor.  Your medicines do not work.  Your headache feels different than the other headaches.  You feel sick to your stomach (nauseous) or throw up (vomit). MAKE SURE YOU:   Understand these instructions.  Will watch your condition.  Will get help right away if you are not doing well or get worse. Document Released: 06/21/2008 Document Revised: 12/05/2011 Document Reviewed: 09/02/2011 [facility] Patient Information 2015 [place]. This information is not intended to replace advice given to you by your health care provider. Make sure you discuss any questions you have with your health care provider.

## 2014-04-24 ENCOUNTER — Other Ambulatory Visit: Payer: Self-pay | Admitting: Cardiovascular Disease

## 2014-04-24 ENCOUNTER — Ambulatory Visit: Payer: BC Managed Care – PPO

## 2014-05-01 ENCOUNTER — Encounter: Payer: Self-pay | Admitting: Physician Assistant

## 2014-05-01 NOTE — Telephone Encounter (Signed)
Left a message for pt to call office to schedule appt on 05/02/14

## 2014-05-03 NOTE — Telephone Encounter (Signed)
Called and spoke with pt and pt has finished Augmentin; pt is still having headaches but it is a dull pain now.  Pt states his left ear is bothering him.  Pt aware of appt on 05/05/14 at 1:45 pm.

## 2014-05-04 ENCOUNTER — Other Ambulatory Visit: Payer: Self-pay | Admitting: Internal Medicine

## 2014-05-05 ENCOUNTER — Encounter: Payer: Self-pay | Admitting: Physician Assistant

## 2014-05-05 ENCOUNTER — Ambulatory Visit (INDEPENDENT_AMBULATORY_CARE_PROVIDER_SITE_OTHER): Payer: BC Managed Care – PPO | Admitting: Physician Assistant

## 2014-05-05 VITALS — BP 120/78 | HR 60 | Temp 97.9°F | Resp 18 | Wt 280.0 lb

## 2014-05-05 DIAGNOSIS — R51 Headache: Secondary | ICD-10-CM

## 2014-05-05 DIAGNOSIS — J0100 Acute maxillary sinusitis, unspecified: Secondary | ICD-10-CM

## 2014-05-05 LAB — BASIC METABOLIC PANEL
BUN: 26 mg/dL — ABNORMAL HIGH (ref 6–23)
CALCIUM: 10.3 mg/dL (ref 8.4–10.5)
CO2: 26 mEq/L (ref 19–32)
Chloride: 104 mEq/L (ref 96–112)
Creatinine, Ser: 1.2 mg/dL (ref 0.4–1.5)
GFR: 68.72 mL/min (ref >60.00)
GLUCOSE: 224 mg/dL — AB (ref 70–99)
POTASSIUM: 4.3 meq/L (ref 3.5–5.1)
Sodium: 138 mEq/L (ref 135–145)

## 2014-05-05 MED ORDER — LEVOFLOXACIN 500 MG PO TABS: 500.0000 mg | ORAL_TABLET | Freq: Every day | ORAL | Status: DC

## 2014-05-05 NOTE — Patient Instructions (Addendum)
Levaquin 1 pill daily for 10 days to try and treat sinus infection.  You will be called to schedule an MRI of your head to rule out possible causes of headache within the head.  Plain Over the Counter Mucinex (NOT Mucinex D) for thick secretions  Force NON dairy fluids, drinking plenty of water is best.    Over the Counter Flonase OR Nasacort AQ 1 spray in each nostril twice a day as needed. Use the "crossover" technique into opposite nostril spraying toward opposite ear @ 45 degree angle, not straight up into nostril.   Plain Over the Counter Allegra (NOT D )  160 daily , OR Loratidine 10 mg , OR Zyrtec 10 mg @ bedtime  as needed for itchy eyes & sneezing.  Saline Irrigation and Saline Sprays can also help reduce symptoms.  If emergency symptoms discussed during visit developed, seek medical attention immediately.  Followup in about 1 week to reassess, or for worsening or persistent symptoms despite treatment.    General Headache Without Cause A headache is pain or discomfort felt around the head or neck area. The specific cause of a headache may not be found. There are many causes and types of headaches. A few common ones are:  Tension headaches.  Migraine headaches.  Cluster headaches.  Chronic daily headaches. HOME CARE INSTRUCTIONS   Keep all follow-up appointments with your caregiver or any specialist referral.  Only take over-the-counter or prescription medicines for pain or discomfort as directed by your caregiver.  Lie down in a dark, quiet room when you have a headache.  Keep a headache journal to find out what may trigger your migraine headaches. For example, write down:  What you eat and drink.  How much sleep you get.  Any change to your diet or medicines.  Try massage or other relaxation techniques.  Put ice packs or heat on the head and neck. Use these 3 to 4 times per day for 15 to 20 minutes each time, or as needed.  Limit stress.  Sit up straight,  and do not tense your muscles.  Quit smoking if you smoke.  Limit alcohol use.  Decrease the amount of caffeine you drink, or stop drinking caffeine.  Eat and sleep on a regular schedule.  Get 7 to 9 hours of sleep, or as recommended by your caregiver.  Keep lights dim if bright lights bother you and make your headaches worse. SEEK MEDICAL CARE IF:   You have problems with the medicines you were prescribed.  Your medicines are not working.  You have a change from the usual headache.  You have nausea or vomiting. SEEK IMMEDIATE MEDICAL CARE IF:   Your headache becomes severe.  You have a fever.  You have a stiff neck.  You have loss of vision.  You have muscular weakness or loss of muscle control.  You start losing your balance or have trouble walking.  You feel faint or pass out.  You have severe symptoms that are different from your first symptoms. MAKE SURE YOU:   Understand these instructions.  Will watch your condition.  Will get help right away if you are not doing well or get worse. Document Released: 09/12/2005 Document Revised: 12/05/2011 Document Reviewed: 09/28/2011 [employer] Patient Information 2015 [place], [place]. This information is not intended to replace advice given to you by your health care provider. Make sure you discuss any questions you have with your health care provider.

## 2014-05-05 NOTE — Progress Notes (Signed)
Subjective:    Patient ID: [name], male    DOB: 11-09-1950, 63 y.o.   MRN: [id number]  Otalgia  There is pain in the left ear. The current episode started in the past 7 days. The problem occurs constantly. The problem has been unchanged. There has been no fever. The pain is at a severity of 8/10. The pain is severe. Associated symptoms include headaches, hearing loss and neck pain (limited ROM due to hx of bone spurs, no recent change or worsening.). Pertinent negatives include no abdominal pain, coughing, diarrhea, drainage, ear discharge, rash, rhinorrhea, sore throat or vomiting. He has tried NSAIDs and acetaminophen (pt has been on augmentin for the past 10 days with no relief.) for the symptoms. The treatment provided no relief. There is no history of a chronic ear infection, hearing loss or a tympanostomy tube.   Patient is a 63 y.o. male presenting for follow up on sinusitis and headaches, and for new onset left ear pain.  Pt was seen 2 weeks ago for increased sinus pressure and pain for several weeks and severe headaches. Pt has had sinus headaches in the past that were similar, just not as severe. Pt was treated with augmentin for the sinusitis and told to followup if the headaches did not improve with treatment of sinusitis. Patient states that he tolerated this medication well and denies adverse effects. His sinus symptoms have improved. His headaches have also improved some, however they are still present.    Review of Systems  Constitutional: Negative for fever and chills.  HENT: Positive for congestion, ear pain, hearing loss and sinus pressure. Negative for ear discharge, postnasal drip, rhinorrhea and sore throat.   Respiratory: Negative for cough and shortness of breath.   Cardiovascular: Negative for chest pain.  Gastrointestinal: Negative for nausea, vomiting, abdominal pain and diarrhea.  Musculoskeletal: Positive for neck pain (limited ROM due to hx of bone spurs,  no recent change or worsening.). Negative for neck stiffness.  Skin: Negative for rash.  Neurological: Positive for headaches. Negative for syncope, weakness, light-headedness and numbness.  All other systems reviewed and are negative.     Past Medical History  Diagnosis Date  . HYPERLIPIDEMIA     takes Atorvastatin daily  . CORONARY ARTERY DISEASE 04/12/2007  . SLEEP APNEA, OBSTRUCTIVE 01/16/2009  . ESOPHAGEAL STRICTURE 01/16/2009  . PSORIASIS     uses a cream  . PULMONARY EMBOLISM     takes Coumadin daily  . 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 NEPHROLITHIASIS, HX OF 02/27/2009  . ANEMIA-UNSPECIFIED 04/28/2010  . Coronary atherosclerosis of unspecified type of vessel, native or graft     s/p DES to RCA x2 in 2005; s/p DES to CFX 2009; s/p DES PTCA of RCA for  in stent restenosis 03/2010; s/p Promus DES x2 07/16/10(recurrent pain with abnormal Myoview oct 18,2011; residual at cath 07/16/10: LAD 30%; CFX 30%; RCA 30% ISR.  Marland Kitchen Leukopenia 11/11/2011  . DEPRESSION     takes Celexa daily  . HYPERTENSION     takes Benicar and Metoprolol daily  . GERD     takes Nexium daily  . PONV (postoperative nausea and vomiting)   . Peripheral edema     takes Chlorthalidone daily  . Peripheral neuropathy   . Arthritis   . Joint pain   . Seasonal allergies     takes Zyrtec daily  . History of colon polyps   . History of kidney stones   . DIABETES MELLITUS, TYPE  II, UNCONTROLLED 06/08/2007    Novolog and Levemir daily as well as Metformin and Glipizide  . DIABETES MELLITUS-TYPE II 09/01/2010    History   Social History  . Marital Status: Married    Spouse Name: N/A    Number of Children: N/A  . Years of Education: N/A   Occupational History  . Not on file.   Social History Main Topics  . Smoking status: Never Smoker   . Smokeless tobacco: Never Used  . Alcohol Use: No  . Drug Use: No  . Sexual Activity: Yes   Other Topics Concern  . Not on file   Social History Narrative  . No  narrative on file    Past Surgical History  Procedure Laterality Date  . Knee arthroscopy  2007    left knee x 2  . Angioplasty  2005 & 2011  . Esophagogastroduodenoscopy    . Colonoscopy    . Carpal tunnel release Bilateral   . Coronary angioplasty with stent placement  2005/2009    5 stents   . Cystoscopy      x 2    Family History  Problem Relation Age of Onset  . Pulmonary embolism Mother   . Factor V Leiden deficiency Mother   . Diabetes Brother   . Heart failure Brother   . Hypertension Brother     Allergies  Allergen Reactions  . Piroxicam     PHELDENE REACTION: unspecified: blistering on hands  . Rofecoxib     REACTION: Reaction not known    Current Outpatient Prescriptions on File Prior to Visit  Medication Sig Dispense Refill  . atorvastatin (LIPITOR) 40 MG tablet Take 1 tablet (40 mg total) by mouth daily.  90 tablet  3  . BAYER CONTOUR TEST test strip 1 each by Other route 2 (two) times daily.       Marland Kitchen BAYER CONTOUR TEST test strip TEST SUGAR TWICE A DAY  100 each  11  . chlorthalidone (HYGROTON) 25 MG tablet TAKE 1 TABLET BY MOUTH EVERY DAY  30 tablet  6  . citalopram (CELEXA) 20 MG tablet TAKE 1 TABLET BY MOUTH EVERY DAY  30 tablet  3  . CLOBETASOL PROPIONATE E 0.05 % emollient cream APPLY TO AFFECTED AREA TWICE A DAY  30 g  0  . EFFIENT 5 MG TABS tablet TAKE 1 TABLET BY MOUTH DAILY  30 tablet  1  . fish oil-omega-3 fatty acids 1000 MG capsule Take 3 g by mouth daily.       . fluticasone (FLONASE) 50 MCG/ACT nasal spray Place 2 sprays into both nostrils daily.  16 g  6  . furosemide (LASIX) 20 MG tablet Take 20 mg by mouth as needed.      . gabapentin (NEURONTIN) 100 MG capsule Take 1 capsule by mouth 3 (three) times daily.      Marland Kitchen gabapentin (NEURONTIN) 100 MG capsule TAKE 1 CAPSULE (100 MG TOTAL) BY MOUTH 3 (THREE) TIMES DAILY.  90 capsule  3  . glipiZIDE (GLUCOTROL) 10 MG tablet TAKE 1 TABLET BY MOUTH TWICE A DAY BEFORE A MEAL  60 tablet  5  .  HYDROcodone-acetaminophen (NORCO/VICODIN) 5-325 MG per tablet Take 1 tablet by mouth daily as needed.       . insulin aspart (NOVOLOG) 100 UNIT/ML injection INJECT 44 UNITS INTO THE SKIN 4 TIMES DAILY BEFORE MEALS.      Marland Kitchen Insulin Detemir (LEVEMIR FLEXTOUCH) 100 UNIT/ML Pen Inject 60 Units into the skin  2 (two) times daily. Or as directed  20 pen  11  . losartan (COZAAR) 100 MG tablet Take 1 tablet (100 mg total) by mouth daily.  90 tablet  3  . metoprolol succinate (TOPROL-XL) 25 MG 24 hr tablet TAKE 1 TABLET BY MOUTH EVERY DAY  30 tablet  5  . nitroGLYCERIN (NITROSTAT) 0.4 MG SL tablet Place 1 tablet (0.4 mg total) under the tongue every 5 (five) minutes as needed.  25 tablet  3  . nystatin cream (MYCOSTATIN) APPLY 1 APPLICATION TOPICALLY 2 (TWO) TIMES DAILY AS NEEDED FOR DRY SKIN.  30 g  1  . omeprazole (PRILOSEC) 20 MG capsule Take 1 capsule (20 mg total) by mouth daily.  90 capsule  3  . Potassium Citrate 15 MEQ (1620 MG) TBCR       . prasugrel (EFFIENT) 5 MG TABS tablet Take 5 mg by mouth daily.      Marland Kitchen warfarin (COUMADIN) 5 MG tablet TAKE AS DIRECTED BY ANTICOAGULATION CLINIC  35 tablet  3   No current facility-administered medications on file prior to visit.    EXAM: BP 120/78  Pulse 60  Temp(Src) 97.9 F (36.6 C) (Oral)  Resp 18  Wt 280 lb (127.007 kg)     Objective:   Physical Exam  Nursing note and vitals reviewed. Constitutional: He is oriented to person, place, and time. He appears well-developed and well-nourished. No distress.  HENT:  Head: Normocephalic and atraumatic.  Right Ear: External ear normal.  Left Ear: External ear normal.  Nose: Nose normal.  Mouth/Throat: No oropharyngeal exudate.  Oropharynx is slightly erythematous, no exudate. Bilateral TMs normal. Bilateral frontal sinuses non--TTP. Right maxillary sinus non--TTP Left maxillary sinus TTP  Eyes: Conjunctivae and EOM are normal. Pupils are equal, round, and reactive to light.  Neck: Neck supple.    Neck ROM limited due to pain from history of bone spurs.  Cardiovascular: Normal rate, regular rhythm and intact distal pulses.   Pulmonary/Chest: Effort normal. No stridor. No respiratory distress. He has no wheezes. He has no rales. He exhibits no tenderness.  Lymphadenopathy:    He has no cervical adenopathy.  Neurological: He is alert and oriented to person, place, and time.  Skin: Skin is warm and dry. No rash noted. He is not diaphoretic. No erythema. No pallor.  Psychiatric: He has a normal mood and affect. His behavior is normal. Judgment and thought content normal.     Lab Results  Component Value Date   WBC 4.8 12/25/2013   HGB 12.4* 12/25/2013   HCT 36.9* 12/25/2013   PLT 225 12/25/2013   GLUCOSE 156* 12/25/2013   CHOL 186 03/13/2014   TRIG 389.0* 03/13/2014   HDL 32.10* 03/13/2014   LDLDIRECT 113.9 07/05/2013   LDLCALC 76 03/13/2014   ALT 38 01/06/2014   AST 31 01/06/2014   NA 141 12/25/2013   K 5.5* 12/25/2013   CL 104 12/25/2013   CREATININE 0.95 12/25/2013   BUN 29* 12/25/2013   CO2 23 12/25/2013   TSH 1.48 08/04/2011   PSA 0.22 08/04/2011   INR 3.0 04/21/2014   HGBA1C 7.6* 01/06/2014   MICROALBUR 3.0* 01/06/2014        Assessment & Plan:  Brown was seen today for left ear pain.  Diagnoses and associated orders for this visit:  Headache(784.0) Comments: Persistent despite treatment of sinusitis, now with some hearing loss? Will obtain MRI. - levofloxacin (LEVAQUIN) 500 MG tablet; Take 1 tablet (500 mg total) by mouth daily. -  MR MRA HEAD W CONTRAST; Future - Basic Metabolic Panel  Acute maxillary sinusitis, recurrence not specified Comments: Focal sinus ttp still present despite tx with augmentin. Will rx levaquin. - levofloxacin (LEVAQUIN) 500 MG tablet; Take 1 tablet (500 mg total) by mouth daily. - MR MRA HEAD W CONTRAST; Future - Basic Metabolic Panel    Return precautions provided, and patient handout on general headache.  Plan to follow up in about 1 week to  reassess, or for worsening or persistent symptoms despite treatment.  Patient Instructions  Levaquin 1 pill daily for 10 days to try and treat sinus infection.  You will be called to schedule an MRI of your head to rule out possible causes of headache within the head.  Plain Over the Counter Mucinex (NOT Mucinex D) for thick secretions  Force NON dairy fluids, drinking plenty of water is best.    Over the Counter Flonase OR Nasacort AQ 1 spray in each nostril twice a day as needed. Use the "crossover" technique into opposite nostril spraying toward opposite ear @ 45 degree angle, not straight up into nostril.   Plain Over the Counter Allegra (NOT D )  160 daily , OR Loratidine 10 mg , OR Zyrtec 10 mg @ bedtime  as needed for itchy eyes & sneezing.  Saline Irrigation and Saline Sprays can also help reduce symptoms.  If emergency symptoms discussed during visit developed, seek medical attention immediately.  Followup in about 1 week to reassess, or for worsening or persistent symptoms despite treatment.

## 2014-05-05 NOTE — Progress Notes (Signed)
Pre visit review using our clinic review tool, if applicable. No additional management support is needed unless otherwise documented below in the visit note. 

## 2014-05-08 ENCOUNTER — Inpatient Hospital Stay: Admission: RE | Admit: 2014-05-08 | Payer: BC Managed Care – PPO | Source: Ambulatory Visit

## 2014-05-12 ENCOUNTER — Ambulatory Visit: Payer: BC Managed Care – PPO | Admitting: Physician Assistant

## 2014-05-12 ENCOUNTER — Telehealth: Payer: Self-pay

## 2014-05-12 NOTE — Telephone Encounter (Signed)
Left a message for pt to return call; wanted to ask pt about MRI of brain.

## 2014-05-14 NOTE — Telephone Encounter (Signed)
Gavin PoundDeborah spoke with Molli HazardMatthew and advised that pt had cancelled appt for MRI.

## 2014-05-15 NOTE — Telephone Encounter (Signed)
Pt returned call; pt states he got better and his ear stopped hurting, headaches eased off and pt is about to finish the abx prescribed in a few days.  Pt states he did not want to spend that kind of money on an MRI at this time.  Pt would like to finish the medication first and then decide what to do.  [name] is aware.

## 2014-05-19 ENCOUNTER — Emergency Department (HOSPITAL_COMMUNITY): Payer: BC Managed Care – PPO

## 2014-05-19 ENCOUNTER — Encounter (HOSPITAL_COMMUNITY): Payer: Self-pay | Admitting: Emergency Medicine

## 2014-05-19 ENCOUNTER — Ambulatory Visit: Payer: BC Managed Care – PPO

## 2014-05-19 ENCOUNTER — Emergency Department (HOSPITAL_COMMUNITY)
Admission: EM | Admit: 2014-05-19 | Discharge: 2014-05-19 | Disposition: A | Discharge status: Home / Self Care | Payer: BC Managed Care – PPO | Attending: Emergency Medicine | Admitting: Emergency Medicine

## 2014-05-19 ENCOUNTER — Ambulatory Visit: Payer: BC Managed Care – PPO | Admitting: Family

## 2014-05-19 DIAGNOSIS — E785 Hyperlipidemia, unspecified: Secondary | ICD-10-CM | POA: Insufficient documentation

## 2014-05-19 DIAGNOSIS — I2510 Atherosclerotic heart disease of native coronary artery without angina pectoris: Secondary | ICD-10-CM | POA: Insufficient documentation

## 2014-05-19 DIAGNOSIS — IMO0002 Reserved for concepts with insufficient information to code with codable children: Secondary | ICD-10-CM | POA: Diagnosis not present

## 2014-05-19 DIAGNOSIS — Z8739 Personal history of other diseases of the musculoskeletal system and connective tissue: Secondary | ICD-10-CM | POA: Diagnosis not present

## 2014-05-19 DIAGNOSIS — Z860100 Personal history of colon polyps, unspecified: Secondary | ICD-10-CM | POA: Insufficient documentation

## 2014-05-19 DIAGNOSIS — Z792 Long term (current) use of antibiotics: Secondary | ICD-10-CM | POA: Insufficient documentation

## 2014-05-19 DIAGNOSIS — Z872 Personal history of diseases of the skin and subcutaneous tissue: Secondary | ICD-10-CM | POA: Diagnosis not present

## 2014-05-19 DIAGNOSIS — F3289 Other specified depressive episodes: Secondary | ICD-10-CM | POA: Diagnosis not present

## 2014-05-19 DIAGNOSIS — Z794 Long term (current) use of insulin: Secondary | ICD-10-CM | POA: Diagnosis not present

## 2014-05-19 DIAGNOSIS — K219 Gastro-esophageal reflux disease without esophagitis: Secondary | ICD-10-CM | POA: Diagnosis not present

## 2014-05-19 DIAGNOSIS — Z862 Personal history of diseases of the blood and blood-forming organs and certain disorders involving the immune mechanism: Secondary | ICD-10-CM | POA: Diagnosis not present

## 2014-05-19 DIAGNOSIS — IMO0001 Reserved for inherently not codable concepts without codable children: Secondary | ICD-10-CM | POA: Insufficient documentation

## 2014-05-19 DIAGNOSIS — F329 Major depressive disorder, single episode, unspecified: Secondary | ICD-10-CM | POA: Insufficient documentation

## 2014-05-19 DIAGNOSIS — R519 Headache, unspecified: Secondary | ICD-10-CM

## 2014-05-19 DIAGNOSIS — R079 Chest pain, unspecified: Secondary | ICD-10-CM

## 2014-05-19 DIAGNOSIS — Z87442 Personal history of urinary calculi: Secondary | ICD-10-CM | POA: Diagnosis not present

## 2014-05-19 DIAGNOSIS — G8929 Other chronic pain: Secondary | ICD-10-CM | POA: Insufficient documentation

## 2014-05-19 DIAGNOSIS — E1165 Type 2 diabetes mellitus with hyperglycemia: Secondary | ICD-10-CM

## 2014-05-19 DIAGNOSIS — R51 Headache: Secondary | ICD-10-CM | POA: Diagnosis not present

## 2014-05-19 DIAGNOSIS — G609 Hereditary and idiopathic neuropathy, unspecified: Secondary | ICD-10-CM | POA: Diagnosis not present

## 2014-05-19 DIAGNOSIS — Z86711 Personal history of pulmonary embolism: Secondary | ICD-10-CM | POA: Diagnosis not present

## 2014-05-19 DIAGNOSIS — Z7901 Long term (current) use of anticoagulants: Secondary | ICD-10-CM | POA: Diagnosis not present

## 2014-05-19 DIAGNOSIS — Z9861 Coronary angioplasty status: Secondary | ICD-10-CM | POA: Diagnosis not present

## 2014-05-19 DIAGNOSIS — R609 Edema, unspecified: Secondary | ICD-10-CM | POA: Diagnosis not present

## 2014-05-19 DIAGNOSIS — I10 Essential (primary) hypertension: Secondary | ICD-10-CM | POA: Insufficient documentation

## 2014-05-19 DIAGNOSIS — Z0289 Encounter for other administrative examinations: Secondary | ICD-10-CM

## 2014-05-19 LAB — DIFFERENTIAL
BASOS ABS: 0 10*3/uL (ref 0.0–0.1)
BASOS PCT: 1 % (ref 0–1)
EOS ABS: 0.1 10*3/uL (ref 0.0–0.7)
EOS PCT: 5 % (ref 0–5)
LYMPHS PCT: 33 % (ref 12–46)
Lymphs Abs: 0.7 10*3/uL (ref 0.7–4.0)
MONO ABS: 0 10*3/uL — AB (ref 0.1–1.0)
Monocytes Relative: 1 % — ABNORMAL LOW (ref 3–12)
Neutro Abs: 1.3 10*3/uL — ABNORMAL LOW (ref 1.7–7.7)
Neutrophils Relative %: 60 % (ref 43–77)

## 2014-05-19 LAB — BASIC METABOLIC PANEL
Anion gap: 16 — ABNORMAL HIGH (ref 5–15)
BUN: 20 mg/dL (ref 6–23)
CO2: 21 meq/L (ref 19–32)
Calcium: 9.7 mg/dL (ref 8.4–10.5)
Chloride: 104 mEq/L (ref 96–112)
Creatinine, Ser: 0.85 mg/dL (ref 0.50–1.35)
GFR calc Af Amer: >90 mL/min (ref >90)
GFR calc non Af Amer: >90 mL/min (ref >90)
Glucose, Bld: 195 mg/dL — ABNORMAL HIGH (ref 70–99)
Potassium: 4.1 mEq/L (ref 3.7–5.3)
SODIUM: 141 meq/L (ref 137–147)

## 2014-05-19 LAB — HEPATIC FUNCTION PANEL
ALK PHOS: 119 U/L — AB (ref 39–117)
ALT: 29 U/L (ref 0–53)
AST: 29 U/L (ref 0–37)
Albumin: 3.3 g/dL — ABNORMAL LOW (ref 3.5–5.2)
BILIRUBIN TOTAL: 0.3 mg/dL (ref 0.3–1.2)
Total Protein: 6.9 g/dL (ref 6.0–8.3)

## 2014-05-19 LAB — CBC
HCT: 35.2 % — ABNORMAL LOW (ref 39.0–52.0)
Hemoglobin: 11.5 g/dL — ABNORMAL LOW (ref 13.0–17.0)
MCH: 27.1 pg (ref 26.0–34.0)
MCHC: 32.7 g/dL (ref 30.0–36.0)
MCV: 83 fL (ref 78.0–100.0)
PLATELETS: 189 10*3/uL (ref 150–400)
RBC: 4.24 MIL/uL (ref 4.22–5.81)
RDW: 14.4 % (ref 11.5–15.5)
WBC: 1.6 10*3/uL — AB (ref 4.0–10.5)

## 2014-05-19 LAB — PROTIME-INR
INR: 2.97 — AB (ref 0.00–1.49)
Prothrombin Time: 30.9 seconds — ABNORMAL HIGH (ref 11.6–15.2)

## 2014-05-19 LAB — I-STAT TROPONIN, ED: TROPONIN I, POC: 0.01 ng/mL (ref 0.00–0.08)

## 2014-05-19 LAB — SEDIMENTATION RATE: Sed Rate: 28 mm/hr — ABNORMAL HIGH (ref 0–16)

## 2014-05-19 MED ORDER — METOCLOPRAMIDE HCL 5 MG/ML IJ SOLN
10.0000 mg | Freq: Once | INTRAMUSCULAR | Status: AC
  Administered 2014-05-19: 10 mg via INTRAVENOUS
  Filled 2014-05-19: qty 2

## 2014-05-19 MED ORDER — LIDOCAINE HCL 2 % IJ SOLN
10.0000 mL | Freq: Once | INTRAMUSCULAR | Status: AC
  Administered 2014-05-19: 100 mg

## 2014-05-19 MED ORDER — ACETAMINOPHEN 325 MG PO TABS
650.0000 mg | ORAL_TABLET | Freq: Once | ORAL | Status: AC
  Administered 2014-05-19: 650 mg via ORAL
  Filled 2014-05-19: qty 2

## 2014-05-19 MED ORDER — BUPIVACAINE HCL 0.5 % IJ SOLN
50.0000 mL | Freq: Once | INTRAMUSCULAR | Status: AC
  Administered 2014-05-19: 1 mL
  Filled 2014-05-19: qty 50

## 2014-05-19 MED ORDER — DIPHENHYDRAMINE HCL 50 MG/ML IJ SOLN
12.5000 mg | Freq: Once | INTRAMUSCULAR | Status: AC
  Administered 2014-05-19: 12.5 mg via INTRAVENOUS
  Filled 2014-05-19: qty 1

## 2014-05-19 NOTE — ED Provider Notes (Signed)
63 year old male with history of coronary artery disease and pulmonary embolism has been having headaches for the last several weeks. PCP is treated him for possible sinusitis. He relates that over the last year, he has been having difficulty with his balance and sometimes is dropping things. The symptoms seem to be stable over time. His PCP had ordered an MRI of his head but patient has not had the scan done yet. Today to come he comes in for pain which starts at the back of his head and radiates down into his chest and left shoulder. He states his pain is somewhat similar to prior pulmonary emboli. On exam, pupils are equal and reactive. Neck muscles are somewhat tense but full range of motion is present in the muscles are nontender. He has no sinus tenderness. Lungs are clear and heart is regular rate and rhythm. Extremities have trace edema. Initial laboratory work shows leukopenia with WBC 1.6 and hemoglobin which has fallen. INR is therapeutic. He was sent for CT of his head. Initial ECG shows no evidence of an acute coronary injury and initial troponin is negative. He is likely to need more investigation for his leukopenia, headache, chest pain.  I saw and evaluated the patient, reviewed the resident's note and I agree with the findings and plan.   EKG Interpretation   Date/Time:  Monday May 19 2014 14:46:56 EDT Ventricular Rate:  90 PR Interval:  218 QRS Duration: 86 QT Interval:  350 QTC Calculation: 428 R Axis:   -37 Text Interpretation:  Sinus rhythm with 1st degree A-V block Left axis  deviation Pulmonary disease pattern Abnormal ECG When compared with ECG of  07/25/2011, No significant change was found Confirmed by [facility]  MD, Kamryn Messineo  ([id number]) on 05/19/2014 3:10:06 PM        Dione Booze, MD 05/19/14 1616

## 2014-05-19 NOTE — ED Notes (Signed)
Pt discharged home with all belongings, pt alert oriented and ambulatory upon discharge, pt verbalizes understanding of discharge instructions, pt driven home by spouse, NAD, pt ambulatory to exit due to pt requesting walk rather than be taken out in a wheel chair

## 2014-05-19 NOTE — ED Notes (Signed)
Reported Dr. Celene Kras that pt. Needs medication for headache

## 2014-05-19 NOTE — ED Notes (Signed)
Pt reports left cp radiates into back, shoulder and jaw for 1 week. Reports SOB, has 5 stents placed. Reports nausea, vomiting intermittently. Skin is warm and dry. Also has been having h/a's for several weeks, PCP is treating for sinus infection. Was scheduled for MRI of brain last week but didn't go b/c was traveling,

## 2014-05-19 NOTE — ED Provider Notes (Addendum)
CSN: [id number]     Arrival date & time 05/19/14  1441 History   First MD Initiated Contact with Patient 05/19/14 1507     Chief Complaint  Patient presents with  . Chest Pain     (Consider location/radiation/quality/duration/timing/severity/associated sxs/prior Treatment) The history is provided by the patient.    [name] is a 63 y.o. male with a complex past medical history including prior cardiac stent placement, factor V Leiden, and pulmonary embolism, presenting for chest pain, shortness of breath, and headache. Patient endorses an 8 week history of headache, which is bandlike in nature, and feels a "tightness" across his temples. Denies any vision loss, jaw claudication, or new shoulder tenderness. 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 He states these have been somewhat worse lately. Muscle pain has responded in the past to physical therapy. Patient has had intermittent chest pain for 1 week which radiates to his back and left shoulder. Also associated with shortness of breath. No associated nausea, vomiting, or diaphoresis. Patient was concerned because his symptoms have been somewhat similar to prior PE. Patient also has had a headache associated with ear fullness, and jaw "popping." He was recently treated with multiple courses of antibiotics over the past 2-3 weeks by his PCP due to concern for sinus infection. Second course of antibiotics include Levaquin which was discontinued because it created issues with his INR level.  At this time patient denies fever, chills, sinus pain, sore throat, speech difficulty, unilateral muscle weakness, unilateral sensation change, or difficulty walking.     Past Medical History  Diagnosis Date  . HYPERLIPIDEMIA     takes Atorvastatin daily  . CORONARY ARTERY DISEASE 04/12/2007  . SLEEP APNEA, OBSTRUCTIVE 01/16/2009  . ESOPHAGEAL STRICTURE 01/16/2009  . PSORIASIS     uses a cream  .  PULMONARY EMBOLISM     takes Coumadin daily  . 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 NEPHROLITHIASIS, HX OF 02/27/2009  . ANEMIA-UNSPECIFIED 04/28/2010  . Coronary atherosclerosis of unspecified type of vessel, native or graft     s/p DES to RCA x2 in 2005; s/p DES to CFX 2009; s/p DES PTCA of RCA for  in stent restenosis 03/2010; s/p Promus DES x2 07/16/10(recurrent pain with abnormal Myoview oct 18,2011; residual at cath 07/16/10: LAD 30%; CFX 30%; RCA 30% ISR.  Marland Kitchen Leukopenia 11/11/2011  . DEPRESSION     takes Celexa daily  . HYPERTENSION     takes Benicar and Metoprolol daily  . GERD     takes Nexium daily  . PONV (postoperative nausea and vomiting)   . Peripheral edema     takes Chlorthalidone daily  . Peripheral neuropathy   . Arthritis   . Joint pain   . Seasonal allergies     takes Zyrtec daily  . History of colon polyps   . History of kidney stones   . DIABETES MELLITUS, TYPE II, UNCONTROLLED 06/08/2007    Novolog and Levemir daily as well as Metformin and Glipizide  . DIABETES MELLITUS-TYPE II 09/01/2010   Past Surgical History  Procedure Laterality Date  . Knee arthroscopy  2007    left knee x 2  . Angioplasty  2005 & 2011  . Esophagogastroduodenoscopy    . Colonoscopy    . Carpal tunnel release Bilateral   . Coronary angioplasty with stent placement  2005/2009    5 stents   . Cystoscopy      x 2  Family History  Problem Relation Age of Onset  . Pulmonary embolism Mother   . Factor V Leiden deficiency Mother   . Diabetes Brother   . Heart failure Brother   . Hypertension Brother    History  Substance Use Topics  . Smoking status: Never Smoker   . Smokeless tobacco: Never Used  . Alcohol Use: No    Review of Systems  Constitutional: Negative.   Eyes: Negative.   Respiratory: Positive for shortness of breath. Negative for choking.   Cardiovascular: Positive for chest pain.  Gastrointestinal: Positive for nausea and vomiting.  Endocrine: Negative.    Genitourinary: Negative.   Musculoskeletal: Positive for back pain.  Skin: Negative.   Allergic/Immunologic: Negative.   Neurological: Positive for headaches.  Hematological: Negative.   Psychiatric/Behavioral: Negative.       Allergies  Piroxicam and Rofecoxib  Home Medications   Prior to Admission medications   Medication Sig Start Date End Date Taking? Authorizing Provider  atorvastatin (LIPITOR) 40 MG tablet Take 1 tablet (40 mg total) by mouth daily. 01/13/14  Yes Lindley Magnus, MD  cetirizine (ZYRTEC) 10 MG tablet Take 10 mg by mouth daily.   Yes Historical Provider, MD  chlorthalidone (HYGROTON) 25 MG tablet Take 25 mg by mouth every morning.   Yes Historical Provider, MD  citalopram (CELEXA) 20 MG tablet Take 20 mg by mouth at bedtime.   Yes Historical Provider, MD  clobetasol cream (TEMOVATE) 0.05 % Apply 1 application topically 2 (two) times daily as needed (for rash).   Yes Historical Provider, MD  fish oil-omega-3 fatty acids 1000 MG capsule Take 3 g by mouth daily.    Yes Historical Provider, MD  fluticasone (FLONASE) 50 MCG/ACT nasal spray Place 2 sprays into both nostrils daily. 09/03/13  Yes Baker Pierini, FNP  furosemide (LASIX) 20 MG tablet Take 20 mg by mouth as needed for fluid.  05/07/12 07/12/14 Yes Baker Pierini, FNP  gabapentin (NEURONTIN) 100 MG capsule Take 1 capsule by mouth 3 (three) times daily. 12/05/13  Yes Historical Provider, MD  glipiZIDE (GLUCOTROL XL) 10 MG 24 hr tablet Take 10 mg by mouth 2 (two) times daily.   Yes Historical Provider, MD  insulin aspart (NOVOLOG) 100 UNIT/ML injection Inject 44 Units into the skin 4 (four) times daily.   Yes Historical Provider, MD  Insulin Detemir (LEVEMIR FLEXTOUCH) 100 UNIT/ML Pen Inject 60 Units into the skin 2 (two) times daily. Or as directed 01/13/14  Yes Bruce Romilda Garret, MD  losartan (COZAAR) 100 MG tablet Take 100 mg by mouth every morning.   Yes Historical Provider, MD  metoprolol succinate  (TOPROL-XL) 25 MG 24 hr tablet Take 25 mg by mouth every morning.   Yes Historical Provider, MD  omeprazole (PRILOSEC) 20 MG capsule Take 1 capsule (20 mg total) by mouth daily. 01/13/14  Yes Lindley Magnus, MD  Potassium Citrate 15 MEQ (1620 MG) TBCR Take 1 tablet by mouth 3 (three) times daily.  12/06/13  Yes Historical Provider, MD  prasugrel (EFFIENT) 5 MG TABS tablet Take 5 mg by mouth every morning.   Yes Historical Provider, MD  warfarin (COUMADIN) 5 MG tablet Take 5-7.5 mg by mouth daily. Takes 7.5mg  on Wed only Takes  all other days   Yes Historical Provider, MD  levofloxacin (LEVAQUIN) 500 MG tablet Take 1 tablet (500 mg total) by mouth daily. 05/05/14   Toniann Ket, PA-C  nitroGLYCERIN (NITROSTAT) 0.4 MG SL tablet Place 1 tablet (0.4 mg  total) under the tongue every 5 (five) minutes as needed. 05/21/13   Tonny Bollman, MD   BP 155/74  Pulse 86  Temp(Src) 98.3 F (36.8 C) (Oral)  Resp 16  Wt 265 lb (120.203 kg)  SpO2 97% Physical Exam  Nursing note and vitals reviewed. Constitutional: He is oriented to person, place, and time. He appears well-developed and well-nourished. No distress.  HENT:  Head: Normocephalic and atraumatic.  Right Ear: External ear normal.  Left Ear: External ear normal.  Nose: Nose normal.  Mouth/Throat: Oropharynx is clear and moist. No oropharyngeal exudate.  Eyes: Conjunctivae and EOM are normal. Pupils are equal, round, and reactive to light. Right eye exhibits no discharge. Left eye exhibits no discharge. No scleral icterus.  Neck: Normal range of motion. Neck supple. No JVD present. Muscular tenderness present. No spinous process tenderness present. No rigidity. No tracheal deviation and normal range of motion present. No Brudzinski's sign and no Kernig's sign noted. No thyromegaly present.    Cardiovascular: Normal rate, regular rhythm, normal heart sounds and intact distal pulses.  Exam reveals no gallop and no friction rub.   No murmur  heard. Pulmonary/Chest: Effort normal and breath sounds normal. No stridor. No respiratory distress. He has no wheezes. He has no rales. He exhibits no tenderness.  Abdominal: Soft. He exhibits no distension. There is no tenderness.  Musculoskeletal: Normal range of motion. He exhibits tenderness. He exhibits no edema.       Cervical back: He exhibits tenderness, pain and spasm. He exhibits no swelling.       Back:  Lymphadenopathy:    He has no cervical adenopathy.  Neurological: He is alert and oriented to person, place, and time.  Skin: Skin is warm and dry. No rash noted. He is not diaphoretic. No erythema. No pallor.  Psychiatric: He has a normal mood and affect. His behavior is normal. Judgment and thought content normal.    ED Course  Procedures (including critical care time)  Trigger Point Injection A trigger point injection was performed at the site of maximal tenderness in bilateral trapesius muscles using 2% Lidocaine and 0.5% Marcaine. This was well tolerated, and followed by significant relief of pain.   Labs Review Labs Reviewed  CBC - Abnormal; Notable for the following:    WBC 1.6 (*)    Hemoglobin 11.5 (*)    HCT 35.2 (*)    All other components within normal limits  BASIC METABOLIC PANEL - Abnormal; Notable for the following:    Glucose, Bld 195 (*)    Anion gap 16 (*)    All other components within normal limits  PROTIME-INR - Abnormal; Notable for the following:    Prothrombin Time 30.9 (*)    INR 2.97 (*)    All other components within normal limits  HEPATIC FUNCTION PANEL - Abnormal; Notable for the following:    Albumin 3.3 (*)    Alkaline Phosphatase 119 (*)    All other components within normal limits  DIFFERENTIAL - Abnormal; Notable for the following:    Neutro Abs 1.3 (*)    Monocytes Relative 1 (*)    Monocytes Absolute 0.0 (*)    All other components within normal limits  SEDIMENTATION RATE - Abnormal; Notable for the following:    Sed Rate  28 (*)    All other components within normal limits  Rosezena Sensor, ED    Imaging Review Dg Chest 2 View  05/19/2014   CLINICAL DATA:  Headache for 6-8  weeks with left-sided chest pain neck pain arm pain for 4 days with shortness of breath  EXAM: CHEST  2 VIEW  COMPARISON:  December 25, 2013  FINDINGS: The heart size and mediastinal contours are within normal limits. Both lungs are clear. The visualized skeletal structures are unremarkable.  IMPRESSION: No active cardiopulmonary disease.   Electronically Signed   By: Esperanza Heir M.D.   On: 05/19/2014 15:14   Ct Head Wo Contrast  05/19/2014   CLINICAL DATA:  Headache for 2 months  EXAM: CT HEAD WITHOUT CONTRAST  TECHNIQUE: Contiguous axial images were obtained from the base of the skull through the vertex without intravenous contrast.  COMPARISON:  None.  FINDINGS: No mass lesion. No midline shift. No acute hemorrhage or hematoma. No extra-axial fluid collections. No evidence of acute infarction. Calvarium is intact with no inflammatory change in the visualized portions of the sinuses.  IMPRESSION: No acute findings   Electronically Signed   By: Esperanza Heir M.D.   On: 05/19/2014 18:13     EKG Interpretation   Date/Time:  Monday May 19 2014 14:46:56 EDT Ventricular Rate:  90 PR Interval:  218 QRS Duration: 86 QT Interval:  350 QTC Calculation: 428 R Axis:   -37 Text Interpretation:  Sinus rhythm with 1st degree A-V block Left axis  deviation Pulmonary disease pattern Abnormal ECG When compared with ECG of  07/25/2011, No significant change was found Confirmed by [facility]  MD, DAVID  ([id number]) on 05/19/2014 3:10:06 PM      MDM   Final diagnoses:  Chest pain, unspecified chest pain type  Chronic nonintractable headache, unspecified headache type    Patient has multiple complaints, which appear to originate from different etiologies. Will obtain an INR to see if patient is therapeutic, and will valuate for need for possible CT  study for PE. Patient has a cardiac history and would be concerned for ACS. Low concern for GCA due to lack of jaw claudication, temporal artery tenderness, or vision loss. Patient's shoulder pain is chronic, and shoulder and neck spasms are likely contributing to his tension headache symptoms. No focal neurologic deficits at this time. Low concern for cerebrovascular pathology as a cause for his current symptoms.  Will treat patient's headache symptomatically, and will attempt trigger point injections in his neck and shoulder muscles to see if this improves his headache symptoms and spasm pain while workup is in process. Will obtain a CT of the head to evaluate for acute intracranial pathology.  INR is in the high therapeutic range, low concern for acute PE/DVT, however patient may not have been therapeutic recently due to his antibiotic use. Initial troponin level WNL. No acute changes on imaging studies, or additional laboratory workup. Patient's symptoms improved following injection of local anesthetic, and headache cocktail. Improved ROM of neck.  Discussed admission for further cardiac workup due to patient's history. Patient will likely follow up with his outpatient cardiologist within the next one to 2 days and will schedule appointment. Patient urged to be admitted for additional studies, but endorses he will be compliant with close outpatient follow up and "wants to go home.".  Patient states chest pain is resolved at this time, and he is not feeling short of breath.  Patient and spouse given strict return precautions for chest pain, headache, and shortness of breath.  Advised to return for worsening symptoms including chest pain, shortness of breath, severe headache, intractable nausea or vomiting, fever, or chills, inability to take medications, or other  acute concerns.  Advised to follow up with [facility] cardiology in 1-2 days.  Patient and family in agreement with and expressed understanding of  follow plan, plan of care, and return precautions.  All questions answered prior to discharge.  Patient was discharged in stable condition with family, ambulating without difficulty.  Patient care was discussed with my attending, Dr. Preston Fleeting.    Gavin Pound, MD 05/20/14 1610  Gavin Pound, MD 06/23/14 2253

## 2014-05-20 ENCOUNTER — Encounter: Payer: Self-pay | Admitting: Physician Assistant

## 2014-05-20 DIAGNOSIS — R519 Headache, unspecified: Secondary | ICD-10-CM

## 2014-05-20 DIAGNOSIS — R51 Headache: Principal | ICD-10-CM

## 2014-06-06 ENCOUNTER — Encounter: Payer: BC Managed Care – PPO | Admitting: Physician Assistant

## 2014-06-29 ENCOUNTER — Other Ambulatory Visit: Payer: Self-pay | Admitting: Cardiovascular Disease

## 2014-07-03 ENCOUNTER — Telehealth: Payer: Self-pay | Admitting: Family

## 2014-07-03 NOTE — Telephone Encounter (Signed)
lmom for pt to cb

## 2014-07-03 NOTE — Telephone Encounter (Signed)
Please call and schedule appt for patient to have PT/INR checked asap

## 2014-07-04 NOTE — Telephone Encounter (Signed)
Pt called back and has been scheduled.

## 2014-07-07 NOTE — ED Provider Notes (Signed)
Medical screening examination/treatment/procedure(s) were performed by non-physician practitioner and as supervising physician I was immediately available for consultation/collaboration.   EKG Interpretation   Date/Time:  Monday May 19 2014 14:46:56 EDT Ventricular Rate:  90 PR Interval:  218 QRS Duration: 86 QT Interval:  350 QTC Calculation: 428 R Axis:   -37 Text Interpretation:  Sinus rhythm with 1st degree A-V block Left axis  deviation Pulmonary disease pattern Abnormal ECG When compared with ECG of  07/25/2011, No significant change was found [facility]  [facility], Tylie Golonka  ([id number]) on 05/19/2014 3:10:06 PM        Adam Boozeavid Sreekar Broyhill, MD 07/07/14 2304

## 2014-07-10 ENCOUNTER — Ambulatory Visit (INDEPENDENT_AMBULATORY_CARE_PROVIDER_SITE_OTHER): Payer: BC Managed Care – PPO | Admitting: Family

## 2014-07-10 DIAGNOSIS — Z5181 Encounter for therapeutic drug level monitoring: Secondary | ICD-10-CM

## 2014-07-10 DIAGNOSIS — I2699 Other pulmonary embolism without acute cor pulmonale: Secondary | ICD-10-CM

## 2014-07-10 DIAGNOSIS — E785 Hyperlipidemia, unspecified: Secondary | ICD-10-CM

## 2014-07-10 DIAGNOSIS — E119 Type 2 diabetes mellitus without complications: Secondary | ICD-10-CM

## 2014-07-10 LAB — POCT INR: INR: 2.6

## 2014-07-10 NOTE — Patient Instructions (Signed)
Continue to take 5 mg all days except 7.5 mg on Wednesdays.  Re-check in 6 weeks.    Anticoagulation Dose Instructions as of 07/10/2014     [name] Mon Tue Wed Thu Fri Sat   New Dose 5 mg 5 mg 5 mg 7.5 mg 5 mg 5 mg 5 mg    Description       Continue to take 5 mg all days except 7.5 mg on Wednesdays.  Re-check in 6 weeks.

## 2014-07-11 ENCOUNTER — Other Ambulatory Visit: Payer: Self-pay | Admitting: Cardiovascular Disease

## 2014-07-13 ENCOUNTER — Other Ambulatory Visit: Payer: Self-pay | Admitting: Family

## 2014-07-18 ENCOUNTER — Other Ambulatory Visit (INDEPENDENT_AMBULATORY_CARE_PROVIDER_SITE_OTHER): Payer: BC Managed Care – PPO

## 2014-07-18 DIAGNOSIS — I10 Essential (primary) hypertension: Secondary | ICD-10-CM

## 2014-07-18 DIAGNOSIS — E119 Type 2 diabetes mellitus without complications: Secondary | ICD-10-CM

## 2014-07-18 DIAGNOSIS — E785 Hyperlipidemia, unspecified: Secondary | ICD-10-CM

## 2014-07-18 DIAGNOSIS — I2510 Atherosclerotic heart disease of native coronary artery without angina pectoris: Secondary | ICD-10-CM

## 2014-07-18 DIAGNOSIS — I2699 Other pulmonary embolism without acute cor pulmonale: Secondary | ICD-10-CM

## 2014-07-18 LAB — POCT URINALYSIS DIPSTICK
Bilirubin, UA: NEGATIVE
GLUCOSE UA: NEGATIVE
KETONES UA: NEGATIVE
Leukocytes, UA: NEGATIVE
Nitrite, UA: NEGATIVE
SPEC GRAV UA: 1.01
UROBILINOGEN UA: 0.2
pH, UA: 5.5

## 2014-07-18 LAB — CBC WITH DIFFERENTIAL/PLATELET
Basophils Absolute: 0 10*3/uL (ref 0.0–0.1)
Basophils Relative: 1.8 % (ref 0.0–3.0)
EOS ABS: 0.2 10*3/uL (ref 0.0–0.7)
Eosinophils Relative: 7 % — ABNORMAL HIGH (ref 0.0–5.0)
HCT: 40.5 % (ref 39.0–52.0)
Hemoglobin: 13.4 g/dL (ref 13.0–17.0)
LYMPHS PCT: 30.6 % (ref 12.0–46.0)
Lymphs Abs: 0.7 10*3/uL (ref 0.7–4.0)
MCHC: 33 g/dL (ref 30.0–36.0)
MCV: 85 fl (ref 78.0–100.0)
MONOS PCT: 0.4 % — AB (ref 3.0–12.0)
Monocytes Absolute: 0 10*3/uL — ABNORMAL LOW (ref 0.1–1.0)
NEUTROS ABS: 1.4 10*3/uL (ref 1.4–7.7)
NEUTROS PCT: 60.2 % (ref 43.0–77.0)
PLATELETS: 225 10*3/uL (ref 150.0–400.0)
RBC: 4.77 Mil/uL (ref 4.22–5.81)
RDW: 16.3 % — ABNORMAL HIGH (ref 11.5–15.5)
WBC: 2.3 10*3/uL — ABNORMAL LOW (ref 4.0–10.5)

## 2014-07-18 LAB — BASIC METABOLIC PANEL
BUN: 26 mg/dL — ABNORMAL HIGH (ref 6–23)
CHLORIDE: 103 meq/L (ref 96–112)
CO2: 24 meq/L (ref 19–32)
Calcium: 10.4 mg/dL (ref 8.4–10.5)
Creatinine, Ser: 0.9 mg/dL (ref 0.4–1.5)
GFR: 87.74 mL/min (ref >60.00)
Glucose, Bld: 100 mg/dL — ABNORMAL HIGH (ref 70–99)
Potassium: 4.2 mEq/L (ref 3.5–5.1)
SODIUM: 138 meq/L (ref 135–145)

## 2014-07-18 LAB — LIPID PANEL
CHOLESTEROL: 200 mg/dL (ref 0–200)
HDL: 26.9 mg/dL — AB (ref >39.00)
NONHDL: 173.1
Total CHOL/HDL Ratio: 7
Triglycerides: 608 mg/dL — ABNORMAL HIGH (ref 0.0–149.0)
VLDL: 121.6 mg/dL — ABNORMAL HIGH (ref 0.0–40.0)

## 2014-07-18 LAB — MICROALBUMIN / CREATININE URINE RATIO
CREATININE, U: 106.6 mg/dL
MICROALB UR: 10.2 mg/dL — AB (ref 0.0–1.9)
Microalb Creat Ratio: 9.6 mg/g (ref 0.0–30.0)

## 2014-07-18 LAB — HEPATIC FUNCTION PANEL
ALK PHOS: 140 U/L — AB (ref 39–117)
ALT: 44 U/L (ref 0–53)
AST: 41 U/L — AB (ref 0–37)
Albumin: 3.3 g/dL — ABNORMAL LOW (ref 3.5–5.2)
Bilirubin, Direct: 0.1 mg/dL (ref 0.0–0.3)
TOTAL PROTEIN: 7.5 g/dL (ref 6.0–8.3)
Total Bilirubin: 0.8 mg/dL (ref 0.2–1.2)

## 2014-07-18 LAB — HEMOGLOBIN A1C: HEMOGLOBIN A1C: 8.6 % — AB (ref 4.6–6.5)

## 2014-07-18 LAB — LDL CHOLESTEROL, DIRECT: LDL DIRECT: 86.3 mg/dL

## 2014-07-21 ENCOUNTER — Telehealth: Payer: Self-pay | Admitting: Internal Medicine

## 2014-07-21 ENCOUNTER — Ambulatory Visit (INDEPENDENT_AMBULATORY_CARE_PROVIDER_SITE_OTHER): Payer: BC Managed Care – PPO | Admitting: Cardiovascular Disease

## 2014-07-21 ENCOUNTER — Encounter: Payer: Self-pay | Admitting: Cardiovascular Disease

## 2014-07-21 ENCOUNTER — Other Ambulatory Visit: Payer: BC Managed Care – PPO

## 2014-07-21 VITALS — BP 115/68 | HR 66 | Ht 72.0 in | Wt 278.8 lb

## 2014-07-21 DIAGNOSIS — I2510 Atherosclerotic heart disease of native coronary artery without angina pectoris: Secondary | ICD-10-CM

## 2014-07-21 NOTE — Progress Notes (Signed)
Background: The patient is followed for a history of coronary artery disease status post multiple PCI procedures. He's been treated with both first and second generation drug-eluting stents. His most recent PCI was in 2011. He has multiple stents in the right coronary artery and also has undergone stenting of the left circumflex. He has a history of pulmonary embolus and is heterozygous for factor V Leiden. He's been maintained on long-term anticoagulation. Because of Plavix hyperresponsiveness and recurrent ACS presentations, he was switched to low-dose effient 5 mg and has done well on this combination of effient and warfarin without aspirin.  HPI:  63 year-old man presenting for follow-up evaluation. The patient has been doing fine from a cardiac perspective. He denies chest pain, chest pressure, orthopnea, or PND. Has had some leg swelling. He also complains of shortness of breath with climbing stairs. No major change in his breathing. He was having problems with headaches several months ago but thinks this was due to drinking some bad water when he had a plumbing problem in his house. He is on well water. All of those symptoms have resolved. He continues to struggle with his weight. He feels as if it's a combination of poor diet and inability to exercise.  Outpatient Encounter Prescriptions as of 07/21/2014  Medication Sig  . atorvastatin (LIPITOR) 40 MG tablet Take 1 tablet (40 mg total) by mouth daily.  . cetirizine (ZYRTEC) 10 MG tablet Take 10 mg by mouth daily.  . chlorthalidone (HYGROTON) 25 MG tablet Take 25 mg by mouth every morning.  . citalopram (CELEXA) 20 MG tablet Take 20 mg by mouth at bedtime.  . clobetasol cream (TEMOVATE) 0.05 % Apply 1 application topically 2 (two) times daily as needed (for rash).  . fish oil-omega-3 fatty acids 1000 MG capsule Take 3 g by mouth daily.   . fluticasone (FLONASE) 50 MCG/ACT nasal spray Place 2 sprays into both nostrils daily.  Marland Kitchen. gabapentin  (NEURONTIN) 100 MG capsule TAKE ONE CAPSULE BY MOUTH 3 TIMES A DAY  . glipiZIDE (GLUCOTROL XL) 10 MG 24 hr tablet Take 10 mg by mouth 2 (two) times daily.  . insulin aspart (NOVOLOG) 100 UNIT/ML injection Inject 44 Units into the skin 4 (four) times daily.  . Insulin Detemir (LEVEMIR FLEXTOUCH) 100 UNIT/ML Pen Inject 60 Units into the skin 2 (two) times daily. Or as directed  . levofloxacin (LEVAQUIN) 500 MG tablet Take 1 tablet (500 mg total) by mouth daily.  Marland Kitchen. losartan (COZAAR) 100 MG tablet Take 100 mg by mouth every morning.  . metoprolol succinate (TOPROL-XL) 25 MG 24 hr tablet Take 25 mg by mouth every morning.  . nitroGLYCERIN (NITROSTAT) 0.4 MG SL tablet Place 1 tablet (0.4 mg total) under the tongue every 5 (five) minutes as needed.  . nystatin cream (MYCOSTATIN)   . omeprazole (PRILOSEC) 20 MG capsule Take 1 capsule (20 mg total) by mouth daily.  . Potassium Citrate 15 MEQ (1620 MG) TBCR Take 1 tablet by mouth 3 (three) times daily.   . prasugrel (EFFIENT) 5 MG TABS tablet Take 5 mg by mouth every morning.  . warfarin (COUMADIN) 5 MG tablet Take 5-7.5 mg by mouth daily. Takes 7.5mg  on Wed only Takes 5mg  all other days  . furosemide (LASIX) 20 MG tablet Take 20 mg by mouth as needed for fluid.   . [DISCONTINUED] EFFIENT 5 MG TABS tablet TAKE 1 TABLET BY MOUTH DAILY  . [DISCONTINUED] metoprolol succinate (TOPROL-XL) 25 MG 24 hr tablet TAKE 1 TABLET  BY MOUTH EVERY DAY    Allergies  Allergen Reactions  . Piroxicam Other (See Comments)    Blistering on hands  . Rofecoxib Other (See Comments)    REACTION: Reaction not known    Past Medical History  Diagnosis Date  . HYPERLIPIDEMIA     takes Atorvastatin daily  . CORONARY ARTERY DISEASE 04/12/2007  . SLEEP APNEA, OBSTRUCTIVE 01/16/2009  . ESOPHAGEAL STRICTURE 01/16/2009  . PSORIASIS     uses a cream  . PULMONARY EMBOLISM     takes Coumadin daily  . 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 NEPHROLITHIASIS, HX OF 02/27/2009  .  ANEMIA-UNSPECIFIED 04/28/2010  . Coronary atherosclerosis of unspecified type of vessel, native or graft     s/p DES to RCA x2 in 2005; s/p DES to CFX 2009; s/p DES PTCA of RCA for  in stent restenosis 03/2010; s/p Promus DES x2 07/16/10(recurrent pain with abnormal Myoview oct 18,2011; residual at cath 07/16/10: LAD 30%; CFX 30%; RCA 30% ISR.  Marland Kitchen. Leukopenia 11/11/2011  . DEPRESSION     takes Celexa daily  . HYPERTENSION     takes Benicar and Metoprolol daily  . GERD     takes Nexium daily  . PONV (postoperative nausea and vomiting)   . Peripheral edema     takes Chlorthalidone daily  . Peripheral neuropathy   . Arthritis   . Joint pain   . Seasonal allergies     takes Zyrtec daily  . History of colon polyps   . History of kidney stones   . DIABETES MELLITUS, TYPE II, UNCONTROLLED 06/08/2007    Novolog and Levemir daily as well as Metformin and Glipizide  . DIABETES MELLITUS-TYPE II 09/01/2010    family history includes Diabetes in his brother; Factor V Leiden deficiency in his mother; Heart failure in his brother; Hypertension in his brother; Pulmonary embolism in his mother.   ROS: Negative except as per HPI  BP 115/68  Pulse 66  Ht 6' (1.829 m)  Wt 278 lb 12.8 oz (126.463 kg)  BMI 37.80 kg/m2  PHYSICAL EXAM: Pt is alert and oriented, pleasant obese male in NAD HEENT: normal Neck: JVP - normal, carotids 2+= without bruits Lungs: CTA bilaterally CV: RRR without murmur or gallop Abd: soft, NT, Positive BS, no hepatomegaly Ext: Trace pretibial edema bilaterally, distal pulses intact and equal Skin: warm/dry no rash  ASSESSMENT AND PLAN: 1. Coronary artery disease, native vessel. The patient is stable without symptoms of angina. Will continue him on low-dose effient in the setting of chronic warfarin use. Will continue his other medications without changes.   2. Hyperlipidemia. The patient will continue on atorvastatin. Triglycerides markedly elevated. Suspect a combination of  poor glycemic control and obesity. Extensive counseling on lifestyle modification and need for weight loss.  3. Hypertension. Blood pressure well controlled on a combination of metoprolol succinate and losartan  4. History of pulmonary embolus with factor V Leiden. He maintains on long-term anticoagulation with warfarin. Reports no bleeding problems.  5. Obesity. Majority of our time spent on weight loss counseling. He understands that his medical problems are going to worsen and he is likely to require insulin if he does not make substantial changes.  Tonny BollmanMichael Temitope Griffing, MD 07/21/2014 3:31 PM

## 2014-07-21 NOTE — Telephone Encounter (Signed)
[id number]- [place], [place] - [address] is requesting re-fill on furosemide (LASIX) 20 MG tablet

## 2014-07-21 NOTE — Patient Instructions (Signed)
Your physician wants you to follow-up in: 6 MONTHS with Dr Cooper.  You will receive a reminder letter in the mail two months in advance. If you don't receive a letter, please call our office to schedule the follow-up appointment.  Your physician recommends that you continue on your current medications as directed. Please refer to the Current Medication list given to you today.  

## 2014-07-22 MED ORDER — FUROSEMIDE 20 MG PO TABS: 20.0000 mg | ORAL_TABLET | ORAL | Status: DC | PRN

## 2014-07-22 NOTE — Telephone Encounter (Signed)
rx sent in electroncially 

## 2014-07-22 NOTE — Telephone Encounter (Signed)
2nd re-fill request was received for the below medication °

## 2014-07-24 ENCOUNTER — Ambulatory Visit: Payer: BC Managed Care – PPO | Admitting: Cardiovascular Disease

## 2014-07-28 ENCOUNTER — Other Ambulatory Visit: Payer: Self-pay | Admitting: Cardiovascular Disease

## 2014-08-11 ENCOUNTER — Other Ambulatory Visit: Payer: Self-pay | Admitting: Internal Medicine

## 2014-08-11 ENCOUNTER — Other Ambulatory Visit (INDEPENDENT_AMBULATORY_CARE_PROVIDER_SITE_OTHER): Payer: BC Managed Care – PPO

## 2014-08-11 ENCOUNTER — Encounter: Payer: Self-pay | Admitting: Family Medicine

## 2014-08-11 ENCOUNTER — Ambulatory Visit (INDEPENDENT_AMBULATORY_CARE_PROVIDER_SITE_OTHER): Payer: BC Managed Care – PPO | Admitting: Family Medicine

## 2014-08-11 VITALS — BP 120/62 | HR 90 | Temp 98.5°F | Wt 276.0 lb

## 2014-08-11 DIAGNOSIS — E1165 Type 2 diabetes mellitus with hyperglycemia: Secondary | ICD-10-CM

## 2014-08-11 DIAGNOSIS — B9689 Other specified bacterial agents as the cause of diseases classified elsewhere: Secondary | ICD-10-CM

## 2014-08-11 DIAGNOSIS — D6851 Activated protein C resistance: Secondary | ICD-10-CM

## 2014-08-11 DIAGNOSIS — A499 Bacterial infection, unspecified: Secondary | ICD-10-CM

## 2014-08-11 DIAGNOSIS — I2699 Other pulmonary embolism without acute cor pulmonale: Secondary | ICD-10-CM

## 2014-08-11 DIAGNOSIS — J329 Chronic sinusitis, unspecified: Secondary | ICD-10-CM

## 2014-08-11 LAB — POCT INR: INR: 2.5

## 2014-08-11 MED ORDER — AMOXICILLIN-POT CLAVULANATE 875-125 MG PO TABS: 1.0000 | ORAL_TABLET | Freq: Two times a day (BID) | ORAL | Status: DC

## 2014-08-11 NOTE — Progress Notes (Signed)
Tana ConchStephen Hunter, MD Phone: [phone]  Subjective:   Rayetta HumphreyJerry L Honeycutt is a 63 y.o. year old very pleasant male patient who presents with the following:  Sinus pressure/headache/cough Started 08/01/14. Started mainly in the throat with congestion and then moved up into the head. Symptoms for 1 week. Better today than yesterday (worst was last Thursday, Friday). Went to the beach over the weekend and hoping to get better but didn't. Night seems to be worse with heavy coughing. Has tried-mucinex, [facility] severe cold and sinus-cough more. Coughing up dark brown, nose blowing out greenish stuff with tinge of blood.   ROS- possible fever, some chills. None today. Mild nausea and burping. 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 atorvastatin (LIPITOR) 40 MG tablet Take 1 tablet (40 mg total) by mouth daily. 90 tablet 3  . cetirizine (ZYRTEC) 10 MG tablet Take 10 mg by mouth daily.    . chlorthalidone (HYGROTON) 25 MG tablet Take 25 mg by mouth every morning.    . citalopram (CELEXA) 20 MG tablet Take 20 mg by mouth at bedtime.    . clobetasol cream (TEMOVATE) 0.05 % Apply 1 application topically 2 (two) times daily as needed (for rash).    Marland Kitchen. EFFIENT 5 MG TABS tablet TAKE 1 TABLET BY MOUTH DAILY 30 tablet 5  . fish oil-omega-3 fatty acids 1000 MG capsule Take 3 g by mouth daily.     . fluticasone (FLONASE) 50 MCG/ACT nasal spray Place 2 sprays into both nostrils daily. 16 g 6  . furosemide (LASIX) 20 MG tablet Take 1 tablet (20 mg total) by mouth as needed for fluid. 30 tablet 5  . gabapentin (NEURONTIN) 100 MG capsule TAKE ONE CAPSULE BY MOUTH 3 TIMES A DAY 90 capsule 3  .  glipiZIDE (GLUCOTROL) 10 MG tablet TAKE 1 TABLET BY MOUTH TWICE A DAY BEFORE A MEAL 60 tablet 5  . insulin aspart (NOVOLOG) 100 UNIT/ML injection Inject 44 Units into the skin 4 (four) times daily.    . Insulin Detemir (LEVEMIR FLEXTOUCH) 100 UNIT/ML Pen Inject 60 Units into the skin 2 (two) times daily. Or as directed 20 pen 11  . levofloxacin (LEVAQUIN) 500 MG tablet Take 1 tablet (500 mg total) by mouth daily. 10 tablet 0  . losartan (COZAAR) 100 MG tablet Take 100 mg by mouth every morning.    . metoprolol succinate (TOPROL-XL) 25 MG 24 hr tablet Take 25 mg by mouth every morning.    Marland Kitchen. omeprazole (PRILOSEC) 20 MG capsule Take 1 capsule (20 mg total) by mouth daily. 90 capsule 3  . Potassium Citrate 15 MEQ (1620 MG) TBCR Take 1 tablet by mouth 3 (three) times daily.     . prasugrel (EFFIENT) 5 MG TABS tablet Take 5 mg by mouth every morning.    . warfarin (COUMADIN) 5 MG tablet Take 5-7.5 mg by mouth daily. Takes 7.5mg  on Wed only Takes 5mg  all other days    . nitroGLYCERIN (NITROSTAT) 0.4 MG SL tablet Place 1 tablet (0.4 mg total) under the tongue every 5 (five) minutes as needed. 25 tablet 3  . nystatin cream (MYCOSTATIN)      No current facility-administered medications for this  visit.    Objective: BP 120/62 mmHg  Pulse 90  Temp(Src) 98.5 F (36.9 C)  Wt 276 lb (125.193 kg) Gen: NAD, resting comfortably HEENT: nares erythematous and swollen with yellow drainage noted, oropharynx normal without pharyngeal exudate, TM normal bilaterally, Mucous membranes are moist. Very tender maxillary sinus, mild frontal tenderness CV: RRR no murmurs rubs or gallops Lungs: CTAB no crackles, wheeze, rhonchi, 98% pulse ox on my reading Abdomen: soft/nontender/nondistended/normal bowel sounds.  Ext: trace edema Skin: warm, dry   Assessment/Plan:  Bacterial Sinusitis Based off persistence of symptoms through 11 days including maxillary sinus tenderness Augmentin x 7 days.  Given reasons for  return  Patient having trouble breathing through mouth citing this as shortness of breath but later admits it is not. Regardless, has trace swelling in known heart failure patient so advised lasix x 5 days and if symptoms do not resolve or worsen, to follow up with us or cards.   No unilateral leg swelling and patient on coumadin in regards to Facto tor V deficiency and history PE. Avoid steroids given history DM and poor control. Known grade II diastolic dysfunction but lungs are clear-doubt CHF as cause of patient's difficult to describe shortness of breathbut gave strict return precautions. Due to comorbidities, complex decision making required.   Meds ordered this encounter  Medications  . amoxicillin-clavulanate (AUGMENTIN) 875-125 MG per tablet    Sig: Take 1 tablet by mouth 2 (two) times daily.    Dispense:  14 tablet    Refill:  0

## 2014-08-11 NOTE — Patient Instructions (Addendum)
Augmentin x 7 days for maxillary bacterial sinusitis If does not resolve within 10 days, come see us back.   Try lasix x 5 days though it sounds like you are not really short of breath but are just having to use your mouth to breath. If worsens, come see us. If persists past 7 days, see us or cardiology

## 2014-08-20 ENCOUNTER — Ambulatory Visit: Payer: BC Managed Care – PPO | Admitting: Family

## 2014-08-22 ENCOUNTER — Other Ambulatory Visit: Payer: Self-pay | Admitting: Internal Medicine

## 2014-08-25 ENCOUNTER — Other Ambulatory Visit: Payer: Self-pay | Admitting: Internal Medicine

## 2014-09-02 ENCOUNTER — Ambulatory Visit (INDEPENDENT_AMBULATORY_CARE_PROVIDER_SITE_OTHER): Payer: BC Managed Care – PPO | Admitting: Family Medicine

## 2014-09-02 ENCOUNTER — Encounter: Payer: Self-pay | Admitting: Family Medicine

## 2014-09-02 VITALS — BP 112/58 | HR 78 | Temp 98.2°F | Wt 279.0 lb

## 2014-09-02 DIAGNOSIS — J029 Acute pharyngitis, unspecified: Secondary | ICD-10-CM

## 2014-09-02 LAB — POCT RAPID STREP A (OFFICE): Rapid Strep A Screen: NEGATIVE

## 2014-09-02 NOTE — Progress Notes (Signed)
Tana ConchStephen Andre Swander, MD Phone: [phone]  Subjective:   [name] is a 63 y.o. year old very pleasant male patient who presents with the following:  Sore throat Treated for bacterial sinusitis in mid-November and symptoms resolved except for occasional feeling of needing to clear his throat. Since Sunday A new symptom of sore throat started. Described as a moderate 8. Worse with swallowing. Her with Tylenol. Try saltwater gargles with little help. Symptoms slightly better today. No sick contacts ROS-mild white sputum, nose clear. Mild headache for 2 weeks. No fever chills.  Past Medical History- Patient Active Problem List   Diagnosis Date Noted  . Pulmonary embolism 11/08/2010    Priority: High  . Poorly controlled diabetes mellitus 09/01/2010    Priority: High  . Factor V Leiden 01/23/2009    Priority: High  . CORONARY ARTERY DISEASE 04/12/2007    Priority: High  . SLEEP APNEA, OBSTRUCTIVE 01/16/2009    Priority: Medium  . RENAL ARTERY STENOSIS 01/16/2009    Priority: Medium  . PSORIASIS 01/16/2009    Priority: Medium  . HYPERLIPIDEMIA 04/12/2007    Priority: Medium  . HYPERTENSION 04/12/2007    Priority: Medium  . Encounter for therapeutic drug monitoring 11/14/2013    Priority: Low  . GERD 04/12/2007    Priority: Low  . Sinus tachycardia 11/27/2012  . Leukopenia 11/11/2011  . DEPRESSION 03/08/2009  . OBESITY-MORBID (>100') 01/16/2009  . ESOPHAGEAL STRICTURE 01/16/2009   Medications- reviewed and updated Current Outpatient Prescriptions  Medication Sig Dispense Refill  . atorvastatin (LIPITOR) 40 MG tablet Take 1 tablet (40 mg total) by mouth daily. 90 tablet 3  . cetirizine (ZYRTEC) 10 MG tablet Take 10 mg by mouth daily.    . chlorthalidone (HYGROTON) 25 MG tablet Take 25 mg by mouth every morning.    . citalopram (CELEXA) 20 MG tablet Take 20 mg by mouth at bedtime.    Marland Kitchen. EFFIENT 5 MG TABS tablet TAKE 1 TABLET BY MOUTH DAILY 30 tablet 5  . fish oil-omega-3  fatty acids 1000 MG capsule Take 3 g by mouth daily.     . furosemide (LASIX) 20 MG tablet Take 1 tablet (20 mg total) by mouth as needed for fluid. 30 tablet 5  . gabapentin (NEURONTIN) 100 MG capsule TAKE ONE CAPSULE BY MOUTH 3 TIMES A DAY 90 capsule 3  . glipiZIDE (GLUCOTROL) 10 MG tablet TAKE 1 TABLET BY MOUTH TWICE A DAY BEFORE A MEAL 60 tablet 5  . insulin aspart (NOVOLOG) 100 UNIT/ML injection Inject 44 Units into the skin 4 (four) times daily.    . Insulin Detemir (LEVEMIR FLEXTOUCH) 100 UNIT/ML Pen Inject 60 Units into the skin 2 (two) times daily. Or as directed 20 pen 11  . levofloxacin (LEVAQUIN) 500 MG tablet Take 1 tablet (500 mg total) by mouth daily. 10 tablet 0  . losartan (COZAAR) 100 MG tablet Take 100 mg by mouth every morning.    . metoprolol succinate (TOPROL-XL) 25 MG 24 hr tablet Take 25 mg by mouth every morning.    Marland Kitchen. NOVOLOG 100 UNIT/ML injection INJECT 44 UNITS INTO THE SKIN 3 (THREE) TIMES DAILY BEFORE MEALS. 50 mL 9  . omeprazole (PRILOSEC) 20 MG capsule Take 1 capsule (20 mg total) by mouth daily. 90 capsule 3  . Potassium Citrate 15 MEQ (1620 MG) TBCR Take 1 tablet by mouth 3 (three) times daily.     . prasugrel (EFFIENT) 5 MG TABS tablet Take 5 mg by mouth every morning.    .Marland Kitchen  warfarin (COUMADIN) 5 MG tablet Take 5-7.5 mg by mouth daily. Takes 7.5mg  on Wed only Takes 5mg  all other days    . warfarin (COUMADIN) 5 MG tablet TAKE AS DIRECTED BY ANTICOAGULATION CLINIC 35 tablet 3  . clobetasol cream (TEMOVATE) 0.05 % Apply 1 application topically 2 (two) times daily as needed (for rash).    . fluticasone (FLONASE) 50 MCG/ACT nasal spray Place 2 sprays into both nostrils daily. (Patient not taking: Reported on 09/02/2014) 16 g 6  . nitroGLYCERIN (NITROSTAT) 0.4 MG SL tablet Place 1 tablet (0.4 mg total) under the tongue every 5 (five) minutes as needed. (Patient not taking: Reported on 09/02/2014) 25 tablet 3  . nystatin cream (MYCOSTATIN)      No current  facility-administered medications for this visit.    Objective: BP 112/58 mmHg  Pulse 78  Temp(Src) 98.2 F (36.8 C)  Wt 279 lb (126.554 kg) Gen: NAD, resting comfortably HEENT: mildly erythematous pharynx with swollen tonsils but no exudate, TM normal, turbinates normal, no sinus tenderness No lymphadenopathy CV: RRR no murmurs rubs or gallops Lungs: CTAB no crackles, wheeze, rhonchi Skin: warm, dry, no rash   Results for orders placed or performed in visit on 09/02/14 (from the past 24 hour(s))  POC Rapid Strep A     Status: None   Collection Time: 09/02/14 11:03 AM  Result Value Ref Range   Rapid Strep A Screen Negative Negative    Assessment/Plan:  Viral pharyngitis Rapid strep negative. Sent for culture as  sore throat is primary symptom thought i doubt strep based on exam/history. Symptomatic treatment with Tylenol and Chloraseptic spray. Return precautions given. See AVS.

## 2014-09-02 NOTE — Patient Instructions (Addendum)
INR check in 2-3 weeks with padonda  Sore throat lozenges, chloraseptic spray  We will call you if this turns out to be strep throat but I doubt it.   See us if this does not resolve within 14 days or if worsens or new symptoms  Pharyngitis Pharyngitis is redness, pain, and swelling (inflammation) of your pharynx.  CAUSES  Pharyngitis is usually caused by infection. Most of the time, these infections are from viruses (viral) and are part of a cold. However, sometimes pharyngitis is caused by bacteria (bacterial). Pharyngitis can also be caused by allergies. Viral pharyngitis may be spread from person to person by coughing, sneezing, and personal items or utensils (cups, forks, spoons, toothbrushes). Bacterial pharyngitis may be spread from person to person by more intimate contact, such as kissing.  SIGNS AND SYMPTOMS  Symptoms of pharyngitis include:   Sore throat.   Tiredness (fatigue).   Low-grade fever.   Headache.  Joint pain and muscle aches.  Skin rashes.  Swollen lymph nodes.  Plaque-like film on throat or tonsils (often seen with bacterial pharyngitis). DIAGNOSIS  Your health care provider will ask you questions about your illness and your symptoms. Your medical history, along with a physical exam, is often all that is needed to diagnose pharyngitis. Sometimes, a rapid strep test is done. Other lab tests may also be done, depending on the suspected cause.  TREATMENT  Viral pharyngitis will usually get better in 3-4 days without the use of medicine. Bacterial pharyngitis is treated with medicines that kill germs (antibiotics).  HOME CARE INSTRUCTIONS   Drink enough water and fluids to keep your urine clear or pale yellow.   Only take over-the-counter or prescription medicines as directed by your health care provider:   If you are prescribed antibiotics, make sure you finish them even if you start to feel better.   Do not take aspirin.   Get lots of rest.    Gargle with 8 oz of salt water ( tsp of salt per 1 qt of water) as often as every 1-2 hours to soothe your throat.   Throat lozenges (if you are not at risk for choking) or sprays may be used to soothe your throat. SEEK MEDICAL CARE IF:   You have large, tender lumps in your neck.  You have a rash.  You cough up green, yellow-brown, or bloody spit. SEEK IMMEDIATE MEDICAL CARE IF:   Your neck becomes stiff.  You drool or are unable to swallow liquids.  You vomit or are unable to keep medicines or liquids down.  You have severe pain that does not go away with the use of recommended medicines.  You have trouble breathing (not caused by a stuffy nose). MAKE SURE YOU:   Understand these instructions.  Will watch your condition.  Will get help right away if you are not doing well or get worse. Document Released: 09/12/2005 Document Revised: 07/03/2013 Document Reviewed: 05/20/2013 [facility] Patient Information 2015 [employer] [place]. This information is not intended to replace advice given to you by your health care provider. Make sure you discuss any questions you have with your health care provider.

## 2014-09-04 LAB — CULTURE, GROUP A STREP: Organism ID, Bacteria: NORMAL

## 2014-09-05 ENCOUNTER — Telehealth: Payer: Self-pay | Admitting: Family Medicine

## 2014-09-05 NOTE — Telephone Encounter (Signed)
Patient called back and was offered an appointment, patient declined.  He is aware that a RX will not be prescribed and stated he will go somewhere else.

## 2014-09-05 NOTE — Telephone Encounter (Signed)
Please advise 

## 2014-09-05 NOTE — Telephone Encounter (Signed)
lmovm to call back.

## 2014-09-05 NOTE — Telephone Encounter (Signed)
Pt said his tonsils are much better. He said is blowing blood out his nose and is questioning he has a sinus infection. He is asking if Dr Durene CalHunter will call him in something.   Pharmacy; [facility] [address]

## 2014-09-05 NOTE — Telephone Encounter (Signed)
Does not meet criteria for bacterial sinusitis. Would recommend him come in Wednesday of next week if symptoms persist. Or he can see us sooner if concerned about the blowing out blood from his nose.

## 2014-09-05 NOTE — Telephone Encounter (Signed)
See if pt would like to be scheduled to come in next week per Dr. Durene CalHunter

## 2014-09-07 ENCOUNTER — Other Ambulatory Visit: Payer: Self-pay | Admitting: Internal Medicine

## 2014-09-08 ENCOUNTER — Ambulatory Visit
Admission: RE | Admit: 2014-09-08 | Discharge: 2014-09-08 | Disposition: A | Discharge status: Home / Self Care | Payer: BC Managed Care – PPO | Source: Ambulatory Visit | Attending: Physician Assistant | Admitting: Physician Assistant

## 2014-09-08 ENCOUNTER — Encounter: Payer: Self-pay | Admitting: Physician Assistant

## 2014-09-08 ENCOUNTER — Telehealth: Payer: Self-pay | Admitting: Cardiovascular Disease

## 2014-09-08 ENCOUNTER — Ambulatory Visit (INDEPENDENT_AMBULATORY_CARE_PROVIDER_SITE_OTHER): Payer: BC Managed Care – PPO | Admitting: Physician Assistant

## 2014-09-08 VITALS — BP 154/76 | HR 95 | Ht 72.0 in | Wt 276.6 lb

## 2014-09-08 DIAGNOSIS — I2510 Atherosclerotic heart disease of native coronary artery without angina pectoris: Secondary | ICD-10-CM

## 2014-09-08 DIAGNOSIS — I10 Essential (primary) hypertension: Secondary | ICD-10-CM

## 2014-09-08 DIAGNOSIS — I2699 Other pulmonary embolism without acute cor pulmonale: Secondary | ICD-10-CM

## 2014-09-08 DIAGNOSIS — R0600 Dyspnea, unspecified: Secondary | ICD-10-CM

## 2014-09-08 DIAGNOSIS — R042 Hemoptysis: Secondary | ICD-10-CM

## 2014-09-08 DIAGNOSIS — E1159 Type 2 diabetes mellitus with other circulatory complications: Secondary | ICD-10-CM

## 2014-09-08 DIAGNOSIS — I519 Heart disease, unspecified: Secondary | ICD-10-CM

## 2014-09-08 DIAGNOSIS — I5189 Other ill-defined heart diseases: Secondary | ICD-10-CM

## 2014-09-08 LAB — BASIC METABOLIC PANEL
BUN: 21 mg/dL (ref 6–23)
CALCIUM: 10.1 mg/dL (ref 8.4–10.5)
CO2: 27 mEq/L (ref 19–32)
CREATININE: 1.1 mg/dL (ref 0.4–1.5)
Chloride: 102 mEq/L (ref 96–112)
GFR: 74.59 mL/min (ref >60.00)
GLUCOSE: 109 mg/dL — AB (ref 70–99)
Potassium: 4.1 mEq/L (ref 3.5–5.1)
Sodium: 136 mEq/L (ref 135–145)

## 2014-09-08 LAB — CBC WITH DIFFERENTIAL/PLATELET
BASOS ABS: 0 10*3/uL (ref 0.0–0.1)
Basophils Relative: 0.9 % (ref 0.0–3.0)
EOS PCT: 5.9 % — AB (ref 0.0–5.0)
Eosinophils Absolute: 0.2 10*3/uL (ref 0.0–0.7)
HCT: 38.4 % — ABNORMAL LOW (ref 39.0–52.0)
Hemoglobin: 12.7 g/dL — ABNORMAL LOW (ref 13.0–17.0)
LYMPHS PCT: 29.3 % (ref 12.0–46.0)
Lymphs Abs: 1 10*3/uL (ref 0.7–4.0)
MCHC: 33.1 g/dL (ref 30.0–36.0)
MCV: 85.1 fl (ref 78.0–100.0)
MONOS PCT: 1.2 % — AB (ref 3.0–12.0)
Monocytes Absolute: 0 10*3/uL — ABNORMAL LOW (ref 0.1–1.0)
NEUTROS PCT: 62.7 % (ref 43.0–77.0)
Neutro Abs: 2 10*3/uL (ref 1.4–7.7)
PLATELETS: 242 10*3/uL (ref 150.0–400.0)
RBC: 4.51 Mil/uL (ref 4.22–5.81)
RDW: 15.3 % (ref 11.5–15.5)
WBC: 3.3 10*3/uL — AB (ref 4.0–10.5)

## 2014-09-08 LAB — BRAIN NATRIURETIC PEPTIDE: PRO B NATRI PEPTIDE: 19 pg/mL (ref 0.0–100.0)

## 2014-09-08 LAB — TSH: TSH: 1.4 u[IU]/mL (ref 0.35–4.50)

## 2014-09-08 LAB — POCT INR: INR: 2.1

## 2014-09-08 LAB — T4, FREE: Free T4: 1.04 ng/dL (ref 0.60–1.60)

## 2014-09-08 NOTE — Telephone Encounter (Signed)
New Message  Pt wife called stating pt was having SoB over the weekend and a little today, wanted to see Dr. Excell Seltzerooper ASAP, sent the call to triage. Please call and discuss what to do going forward.

## 2014-09-08 NOTE — Progress Notes (Signed)
[address] [place], [place]  [address] Phone: [phone] Fax:  [phone]  Date:  09/08/2014   Patient ID:  [name], DOB 07/06/1951, MRN [id number]   PCP:  Dr. Durene CalHunter  Cardiologist: Excell Seltzerooper  History of Present Illness: [name] is a 63 y.o. male with history of CAD s/p multiple PCIs as outlined below, obesity, h/o PE on lifelong Coumadin with heterozygosity for factor V Leiden, HLD, DM, OSA compliant with CPAP who presents as an add-on to flex clinic for evaluation of SOB. Last cath was in 06/2010 following abnormal nuc s/p PTCA/DES x 2 to RCA at the distal edge and distal to the stent; mild residual disease including 30% prox LAD, 30% prox Cx. Last echo 11/2012: moderate LVH, EF 60-65%, grade 2 d/d, PA pressure 33mmHg. Because of Plavix hyperresponsiveness and recurrent ACS presentations, he was switched to low-dose Effient 5 mg and has done well on this combination of Effient and warfarin without aspirin.   He was treated for bacterial sinusitis with Augmentin in 07/2014 and then recently seen again in 08/2014 for viral pharyngitis. On Friday 09/05/14 he noticed blood on the tissue when blowing his nose. Over the weekend he gradually noticed shallow breathing particularly with exertion. He also reports low grade fever from 99.8 to 100.6 with alternating chills and feeling hot. He also reports trace hemoptysis with clear sputum, "hacky-like cough." No known sick exposures. He took a dose of Lasix with ++urination but no improvement in dyspnea. Denies chest pain, orthopnea, LEE, missed doses of medications, significant weight changes. Last INR check was 06/2014 (he reports difficulty scheduling with PCP after Dr. Cato MulliganSwords left) - was 2.1 in clinic today. He also reports some nausea.   Recent Labs: 03/13/2014: LDL (calc) 76 07/18/2014: ALT 44; Creatinine 0.9; Direct LDL 86.3; HDL Cholesterol by NMR 26.90*; Hemoglobin 13.4; Potassium 4.2  Wt Readings from Last 3  Encounters:  09/08/14 276 lb 9.6 oz (125.465 kg)  09/02/14 279 lb (126.554 kg)  08/11/14 276 lb (125.193 kg)     Past Medical History  Diagnosis Date  . Hyperlipidemia   . SLEEP APNEA, OBSTRUCTIVE 01/16/2009  . ESOPHAGEAL STRICTURE 01/16/2009  . PSORIASIS     uses a cream  . PULMONARY EMBOLISM     takes Coumadin daily  . NEPHROLITHIASIS, HX OF 02/27/2009  . ANEMIA-UNSPECIFIED 04/28/2010  . CAD (coronary artery disease)     a. s/p DES to RCA x2 in 2005. b. s/p DES to CFX 2009. c. s/p DES PTCA of RCA for in stent restenosis 03/2010. d. s/p Promus DES x2 07/16/10(recurrent pain with abnormal Myoview 06/2010; residual nonobstructive disease at cath.  . Leukopenia 11/11/2011  . DEPRESSION   . HYPERTENSION   . GERD   . PONV (postoperative nausea and vomiting)   . Peripheral edema   . Peripheral neuropathy   . Arthritis   . Joint pain   . Seasonal allergies   . History of colon polyps   . History of kidney stones   . DIABETES MELLITUS-TYPE II 09/01/2010  . Plavix resistance   . Heterozygous factor V Leiden mutation   . Obesity     Current Outpatient Prescriptions  Medication Sig Dispense Refill  . atorvastatin (LIPITOR) 40 MG tablet Take 1 tablet (40 mg total) by mouth daily. 90 tablet 3  . cetirizine (ZYRTEC) 10 MG tablet Take 10 mg by mouth daily.    . chlorthalidone (HYGROTON) 25 MG tablet Take 25 mg by mouth  every morning.    . citalopram (CELEXA) 20 MG tablet TAKE 1 TABLET BY MOUTH EVERY DAY 30 tablet 4  . clobetasol cream (TEMOVATE) 0.05 % Apply 1 application topically 2 (two) times daily as needed (for rash).    Marland Kitchen EFFIENT 5 MG TABS tablet TAKE 1 TABLET BY MOUTH DAILY 30 tablet 5  . fish oil-omega-3 fatty acids 1000 MG capsule Take 3 g by mouth daily.     . fluticasone (FLONASE) 50 MCG/ACT nasal spray Place 2 sprays into both nostrils daily. 16 g 6  . furosemide (LASIX) 20 MG tablet Take 1 tablet (20 mg total) by mouth as needed for fluid. 30 tablet 5  . gabapentin (NEURONTIN)  100 MG capsule TAKE ONE CAPSULE BY MOUTH 3 TIMES A DAY 90 capsule 3  . glipiZIDE (GLUCOTROL) 10 MG tablet TAKE 1 TABLET BY MOUTH TWICE A DAY BEFORE A MEAL 60 tablet 5  . insulin aspart (NOVOLOG) 100 UNIT/ML injection Inject 44 Units into the skin 4 (four) times daily.    . Insulin Detemir (LEVEMIR FLEXTOUCH) 100 UNIT/ML Pen Inject 60 Units into the skin 2 (two) times daily. Or as directed 20 pen 11  . losartan (COZAAR) 100 MG tablet Take 100 mg by mouth every morning.    . metoprolol succinate (TOPROL-XL) 25 MG 24 hr tablet Take 25 mg by mouth every morning.    . nitroGLYCERIN (NITROSTAT) 0.4 MG SL tablet Place 1 tablet (0.4 mg total) under the tongue every 5 (five) minutes as needed. 25 tablet 3  . NOVOLOG 100 UNIT/ML injection INJECT 44 UNITS INTO THE SKIN 3 (THREE) TIMES DAILY BEFORE MEALS. 50 mL 9  . nystatin cream (MYCOSTATIN) Apply 1 application topically as needed (Patient stated this is PRN for yeast infection).     Marland Kitchen omeprazole (PRILOSEC) 20 MG capsule Take 1 capsule (20 mg total) by mouth daily. 90 capsule 3  . Potassium Citrate 15 MEQ (1620 MG) TBCR Take 1 tablet by mouth 3 (three) times daily.     . prasugrel (EFFIENT) 5 MG TABS tablet Take 5 mg by mouth every morning.    . warfarin (COUMADIN) 5 MG tablet Take 5-7.5 mg by mouth daily. Takes 7.5mg  on Wed only Takes 5mg  all other days    . warfarin (COUMADIN) 5 MG tablet TAKE AS DIRECTED BY ANTICOAGULATION CLINIC (Patient not taking: Reported on 09/08/2014) 35 tablet 3   No current facility-administered medications for this visit.    Allergies:   Piroxicam and Rofecoxib   Social History:  The patient  reports that he 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 He has never used smokeless tobacco. He reports that he does not drink alcohol or use illicit drugs.   Family History:  The patient's family history includes Diabetes in his brother; Factor V Leiden deficiency in his mother; Heart failure in his brother; Hypertension in his brother; Pulmonary embolism  in his mother.   ROS:  Please see the history of present illness.    All other systems reviewed and negative.   PHYSICAL EXAM:  VS:  BP 154/76 mmHg  Pulse 95  Ht 6' (1.829 m)  Wt 276 lb 9.6 oz (125.465 kg)  BMI 37.51 kg/m2 - Pox 95-96% on RA - repeat BP 146/70 Well nourished, well developed WM, nontoxic appearing, cheerful in no acute distress HEENT: normal Neck: no JVD Cardiac:  normal S1, S2; RRR - borderline elevated HR; no murmur Lungs:  Diminished BS at bases bilaterally, no wheezing, rhonchi or rales Abd: soft, nontender, no  hepatomegaly Ext: no edema Skin: warm and dry Neuro:  moves all extremities spontaneously, no focal abnormalities noted  EKG:  Sinus rhythm with 1st degree AVB, left axis deviation, no acute ST-T changes, no significant change from prior  ASSESSMENT AND PLAN:  1. Dyspnea along with trace hemoptysis and recent low grade fever - symptoms concerning for pulmonary process rather than cardiac process. Check 2V CXR, CBC, BNP, BMET. Given mildly elevated HR will also check TSH and free T4 as well. Given fever and lack of hypoxia, I think PE is less likely in the setting of therapeutic INR. However, if his above workup is unrevealing and symptoms do not improve, would consider repeat evaluation. I discussed the above with Dr. Excell Seltzerooper today. If he requires abx therapy will need closer f/u of INR. 2. CAD s/p multiple PCI - no chest pain. If above workup unrevealing, will consider noninvasive ischemic testing.  3. H/o diastolic dysfunction and moderate LVH on echo, likely hypertensive heart disease - does not appear acutely volume overloaded but habitus is slightly prohibitive. Check BNP. Check CXR. 4. HTN - somewhat elevated in clinic today, but possibly fighting off illness. No changes for now - would follow and titrate meds if needed.  5. H/o PE on lifelong Coumadin for factor V heterozygosity - INR is therapeutic today. He was instructed to call his PCP's Coumadin  clinic to get back established on a timeline. Given fever and lack of hypoxia, I think PE is less likely in the setting of therapeutic INR. However, if his above workup is unrevealing and symptoms do not improve, would consider repeat evaluation. 6. DM with circulatory complications (CAD) - followed by PCP.  Dispo:  - F/u PCP at next available appointment for evaluation of fever, hemoptysis. - F/u in our office with Dr. Excell Seltzerooper or PA/NP in 2 weeks. - ER precautions discussed.  Signed, Ronie Spiesayna Lashelle Koy, PA-C  09/08/2014 2:25 PM

## 2014-09-08 NOTE — Patient Instructions (Addendum)
Your physician recommends that you continue on your current medications as directed. Please refer to the Current Medication list given to you today.  Lab Today: Cbc,Bmet,Tsh, T4, Bnp  A chest x-ray takes a picture of the organs and structures inside the chest, including the heart, lungs, and blood vessels. This test can show several things, including, whether the heart is enlarges; whether fluid is building up in the lungs; and whether pacemaker / defibrillator leads are still in place.( [facility] [address])  Your physician recommends that you schedule a follow-up appointment asap with your primary care physician  Your physician recommends that you schedule a follow-up appointment in: 2 weeks with Dr.Cooper/ or Dayna Dunn,PA

## 2014-09-08 NOTE — Telephone Encounter (Signed)
Pt's wife called to see if pt can be seen today . Pt has been having SOB for the last weekend. The breathing is better now, but he still huffing and puffing. Pt has a history of CAD and multiple PCI. Pt has an appointment with Ronie Spiesayna Dunn at 2:00 PM today. Pt and wife are aware.

## 2014-09-09 ENCOUNTER — Encounter: Payer: Self-pay | Admitting: Physician Assistant

## 2014-09-09 ENCOUNTER — Telehealth: Payer: Self-pay | Admitting: *Deleted

## 2014-09-09 NOTE — Telephone Encounter (Signed)
lmptcb to go over results  

## 2014-09-09 NOTE — Telephone Encounter (Signed)
Ptcb and has been notified of lab/cxr results with verbal understanding to these results given today. Pt advised to f/u w/PCP. I will fax results to Dr. Durene CalHunter for pt today.

## 2014-09-09 NOTE — Telephone Encounter (Signed)
Follow Up ° ° ° ° °Pt is returning call from earlier. Please call. °

## 2014-09-10 ENCOUNTER — Ambulatory Visit: Payer: BC Managed Care – PPO | Admitting: Family

## 2014-09-15 ENCOUNTER — Telehealth: Payer: Self-pay | Admitting: Family

## 2014-09-15 NOTE — Telephone Encounter (Signed)
Needs PT/INR appointment in 4 weeks. Please call and schedule.

## 2014-09-15 NOTE — Telephone Encounter (Signed)
lmom for pt to cb

## 2014-09-17 NOTE — Telephone Encounter (Signed)
Pt has been sch for 10-16-14

## 2014-09-22 ENCOUNTER — Ambulatory Visit: Payer: BC Managed Care – PPO | Admitting: Physician Assistant

## 2014-09-25 ENCOUNTER — Ambulatory Visit: Payer: BC Managed Care – PPO | Admitting: Family Medicine

## 2014-10-06 ENCOUNTER — Encounter: Payer: Self-pay | Admitting: Family Medicine

## 2014-10-06 ENCOUNTER — Ambulatory Visit (INDEPENDENT_AMBULATORY_CARE_PROVIDER_SITE_OTHER): Payer: BLUE CROSS/BLUE SHIELD | Admitting: Family Medicine

## 2014-10-06 VITALS — BP 130/66 | HR 104 | Temp 98.6°F | Wt 274.0 lb

## 2014-10-06 DIAGNOSIS — R053 Chronic cough: Secondary | ICD-10-CM

## 2014-10-06 DIAGNOSIS — R05 Cough: Secondary | ICD-10-CM

## 2014-10-06 MED ORDER — TRAMADOL HCL 50 MG PO TABS: 50.0000 mg | ORAL_TABLET | Freq: Four times a day (QID) | ORAL | Status: DC | PRN

## 2014-10-06 NOTE — Progress Notes (Signed)
Subjective:    Patient ID: [name], male    DOB: Aug 07, 1951, 64 y.o.   MRN: [id number]  HPI Patient seen with persistent cough since October. Never smoked. He states back in October he was treated with Augmentin. Symptoms did not resolve. He had chest x-ray per cardiology back in mid December which showed no acute findings. Went to urgent care January 2 and was prescribed Keflex and had some mild diarrhea and stopped this 2 days ago. He's never had any fever. He's had some general fatigue. He has been treated apparently with couple course of prednisone which they have helped temporarily  He denies any GERD symptoms. No active postnasal drip symptoms. No ACE inhibitor use. He denies a recent appetite changes or weight loss. No hemoptysis. Cough has been dry. Not relieved with over-the-counter suppressants  Past Medical History  Diagnosis Date  . Hyperlipidemia   . SLEEP APNEA, OBSTRUCTIVE 01/16/2009  . ESOPHAGEAL STRICTURE 01/16/2009  . PSORIASIS     uses a cream  . PULMONARY EMBOLISM     takes Coumadin daily  . NEPHROLITHIASIS, HX OF 02/27/2009  . ANEMIA-UNSPECIFIED 04/28/2010  . CAD (coronary artery disease)     a. s/p DES to RCA x2 in 2005. b. s/p DES to CFX 2009. c. s/p DES PTCA of RCA for in stent restenosis 03/2010. d. s/p Promus DES x2 to RCA 07/16/10(recurrent pain with abnormal Myoview 06/2010; residual nonobstructive disease at cath.  . Leukopenia 11/11/2011  . DEPRESSION   . HYPERTENSION   . GERD   . PONV (postoperative nausea and vomiting)   . Peripheral edema   . Peripheral neuropathy   . Arthritis   . Joint pain   . Seasonal allergies   . History of colon polyps   . History of kidney stones   . DIABETES MELLITUS-TYPE II 09/01/2010  . Plavix resistance   . Heterozygous factor V Leiden mutation   . Obesity    Past Surgical History  Procedure Laterality Date  . Knee arthroscopy  2007    left knee x 2  . Angioplasty  2005 & 2011  . Esophagogastroduodenoscopy      . Colonoscopy    . Carpal tunnel release Bilateral   . Coronary angioplasty with stent placement  2005/2009    5 stents   . Cystoscopy      x 2    reports that he 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 He has never used smokeless tobacco. He reports that he does not drink alcohol or use illicit drugs. family history includes Diabetes in his brother; Factor V Leiden deficiency in his mother; Heart failure in his brother; Hypertension in his brother; Pulmonary embolism in his mother. Allergies  Allergen Reactions  . Piroxicam Other (See Comments)    Blistering on hands  . Rofecoxib Other (See Comments)    REACTION: Reaction not known      Review of Systems  Constitutional: Negative for fever, chills, appetite change and unexpected weight change.  HENT: Negative for postnasal drip and voice change.   Respiratory: Positive for cough. Negative for shortness of breath.   Cardiovascular: Negative for chest pain, palpitations and leg swelling.  Neurological: Negative for dizziness and syncope.  Hematological: Negative for adenopathy.       Objective:   Physical Exam  Constitutional: He appears well-developed and well-nourished.  HENT:  Mouth/Throat: Oropharynx is clear and moist.  Neck: No JVD present.  Cardiovascular: Normal rate and regular rhythm.   Pulmonary/Chest: Effort normal and breath  sounds normal. No respiratory distress. He has no wheezes. He has no rales.  Musculoskeletal: He exhibits no edema.          Assessment & Plan:  Chronic cough. Duration now over 2 months. Nonfocal exam. Recent chest x-rays above unremarkable. We went over differential. He does not have any obvious GERD symptoms. No ACE inhibitor use. No obvious postnasal drip symptoms. No wheezing on exam today. We have recommended trial of tramadol for suppression 1-2 every 6 hours. Touch base in one week and not improved. Consider fall with pulmonary if not resolving.  No clear indication for further antibiotics at this  time

## 2014-10-06 NOTE — Progress Notes (Signed)
Pre visit review using our clinic review tool, if applicable. No additional management support is needed unless otherwise documented below in the visit note. 

## 2014-10-06 NOTE — Patient Instructions (Signed)
Please call in one week if cough not improved.

## 2014-10-14 ENCOUNTER — Other Ambulatory Visit (INDEPENDENT_AMBULATORY_CARE_PROVIDER_SITE_OTHER): Payer: BLUE CROSS/BLUE SHIELD

## 2014-10-14 ENCOUNTER — Ambulatory Visit (INDEPENDENT_AMBULATORY_CARE_PROVIDER_SITE_OTHER)
Admission: RE | Admit: 2014-10-14 | Discharge: 2014-10-14 | Disposition: A | Discharge status: Home / Self Care | Payer: BLUE CROSS/BLUE SHIELD | Source: Ambulatory Visit | Attending: Internal Medicine | Admitting: Internal Medicine

## 2014-10-14 ENCOUNTER — Ambulatory Visit (INDEPENDENT_AMBULATORY_CARE_PROVIDER_SITE_OTHER): Payer: BLUE CROSS/BLUE SHIELD | Admitting: Internal Medicine

## 2014-10-14 ENCOUNTER — Encounter: Payer: Self-pay | Admitting: Internal Medicine

## 2014-10-14 VITALS — BP 124/68 | HR 120 | Ht 71.0 in | Wt 272.0 lb

## 2014-10-14 DIAGNOSIS — R0600 Dyspnea, unspecified: Secondary | ICD-10-CM

## 2014-10-14 DIAGNOSIS — R059 Cough, unspecified: Secondary | ICD-10-CM | POA: Insufficient documentation

## 2014-10-14 DIAGNOSIS — I10 Essential (primary) hypertension: Secondary | ICD-10-CM

## 2014-10-14 DIAGNOSIS — R05 Cough: Secondary | ICD-10-CM

## 2014-10-14 LAB — CBC WITH DIFFERENTIAL/PLATELET
BASOS ABS: 0 10*3/uL (ref 0.0–0.1)
Basophils Relative: 0.5 % (ref 0.0–3.0)
Eosinophils Absolute: 0.2 10*3/uL (ref 0.0–0.7)
Eosinophils Relative: 7 % — ABNORMAL HIGH (ref 0.0–5.0)
HCT: 39.2 % (ref 39.0–52.0)
HEMOGLOBIN: 13 g/dL (ref 13.0–17.0)
LYMPHS ABS: 0.9 10*3/uL (ref 0.7–4.0)
LYMPHS PCT: 26.7 % (ref 12.0–46.0)
MCHC: 33.3 g/dL (ref 30.0–36.0)
MCV: 82.8 fl (ref 78.0–100.0)
MONO ABS: 0 10*3/uL — AB (ref 0.1–1.0)
MONOS PCT: 0.5 % — AB (ref 3.0–12.0)
NEUTROS ABS: 2.2 10*3/uL (ref 1.4–7.7)
NEUTROS PCT: 65.3 % (ref 43.0–77.0)
PLATELETS: 265 10*3/uL (ref 150.0–400.0)
RBC: 4.73 Mil/uL (ref 4.22–5.81)
RDW: 15.3 % (ref 11.5–15.5)
WBC: 3.4 10*3/uL — ABNORMAL LOW (ref 4.0–10.5)

## 2014-10-14 LAB — HEPATIC FUNCTION PANEL
ALK PHOS: 152 U/L — AB (ref 39–117)
ALT: 28 U/L (ref 0–53)
AST: 24 U/L (ref 0–37)
Albumin: 3.8 g/dL (ref 3.5–5.2)
Bilirubin, Direct: 0.1 mg/dL (ref 0.0–0.3)
Total Bilirubin: 0.5 mg/dL (ref 0.2–1.2)
Total Protein: 7.3 g/dL (ref 6.0–8.3)

## 2014-10-14 LAB — BASIC METABOLIC PANEL
BUN: 25 mg/dL — ABNORMAL HIGH (ref 6–23)
CALCIUM: 11 mg/dL — AB (ref 8.4–10.5)
CO2: 26 mEq/L (ref 19–32)
Chloride: 102 mEq/L (ref 96–112)
Creatinine, Ser: 1.02 mg/dL (ref 0.40–1.50)
GFR: 78.8 mL/min (ref >60.00)
GLUCOSE: 266 mg/dL — AB (ref 70–99)
POTASSIUM: 4.3 meq/L (ref 3.5–5.1)
SODIUM: 138 meq/L (ref 135–145)

## 2014-10-14 LAB — TSH: TSH: 1.15 u[IU]/mL (ref 0.35–4.50)

## 2014-10-14 LAB — D-DIMER, QUANTITATIVE (NOT AT ARMC): D DIMER QUANT: 0.27 ug{FEU}/mL (ref 0.00–0.48)

## 2014-10-14 LAB — BRAIN NATRIURETIC PEPTIDE: PRO B NATRI PEPTIDE: 15 pg/mL (ref 0.0–100.0)

## 2014-10-14 NOTE — Patient Instructions (Addendum)
Please see patient coordinator before you leave today  to schedule a sinus ct asap  Pepcid ac 20 mg after bfast and at bedtime   Stop fish oil, prilosec, zyrtrec   For drainage take chlortrimeton (chlorpheniramine) 4 mg every 4 hours available over the counter (may cause drowsiness)   Take delsym two tsp every 12 hours and supplement if needed with  tramadol 50 mg up to 2 every 4 hours to suppress the urge to cough. Swallowing water or using ice chips/non mint and menthol containing candies (such as lifesavers or sugarless jolly ranchers) are also effective.  You should rest your voice and avoid activities that you know make you cough.  Once you have eliminated the cough for 3 straight days try reducing the tramadol first,  then the delsym as tolerated.    Stop losatan and increase lopressor to  Two every 12 hours   Diet:  Avoid dairy products except yogurt, undercooked veggies and all salads, eat more broth based soups, noodles/ crackers   GERD (REFLUX)  is an extremely common cause of respiratory symptoms just like yours , many times with no obvious heartburn at all.    It can be treated with medication, but also with lifestyle changes including avoidance of late meals, excessive alcohol, smoking cessation, and avoid fatty foods, chocolate, peppermint, colas, red wine, and acidic juices such as orange juice.  NO MINT OR MENTHOL PRODUCTS SO NO COUGH DROPS  USE SUGARLESS CANDY INSTEAD (Jolley ranchers or Stover's or Life Savers) or even ice chips will also do - the key is to swallow to prevent all throat clearing. NO OIL BASED VITAMINS - use powdered substitutes.    Please remember to go to the lab and x-ray department downstairs for your tests - we will call you with the results when they are available.  Return to office in one week, sooner if not improving

## 2014-10-14 NOTE — Progress Notes (Signed)
Subjective:    Patient ID: [name], male    DOB: [birth year],   MRN: [id number]  HPI  55 yowm never smoker with h/o lifelong perennial rhinitis allergies to dust, no response to shots per [facility] but never asthma then dx FV leiden Mutation on lifelong coumadin p PE around 2010 fine until onset of ha/nasal discharge/sorethroat cough late October 2015 never really better desptie multiple abx then onset sob mid Dec 2015 > w/u by cardiology neg > severe incessant cough early Jan 2016 > UC > steroids/ abx > some better then diarrhea > dx with abx assoc diarrhea so keflex stopped and referred to pulmonary clinic 10/14/2014     10/14/2014 1st  Pulmonary office visit [facility]/ Wert   Chief Complaint  Patient presents with  . Pulmonary Consult    Self referral. Pt c/o cough and SOB for the past 2 months. He states that he was dxed with bronchitis and sinus infection recently. Cough is prod with moderate white sputum. He also c/o hoarseness. He states "always" have had tendency to be SOB with exertion.   mucus presently white/ hurts in midline anteriorly just when  cough and overall a little better but still persistent coughing 24/7, unable to lie flat, sitting up in recliner and tol usual cpap poorly tolerating.  No obvious other patterns in day to day or daytime variabilty or assoc  chest tightness, subjective wheeze overt   hb symptoms. No unusual exp hx or h/o childhood pna/ asthma or knowledge of premature birth.  Sleeping ok without nocturnal  or early am exacerbation  of respiratory  c/o's or need for noct saba. Also denies any obvious fluctuation of symptoms with weather or environmental changes or other aggravating or alleviating factors except as outlined above   Current Medications, Allergies, Complete Past Medical History, Past Surgical History, Family History, and Social History were reviewed in [employer] record.             Review of Systems    Constitutional: Negative for fever, chills, activity change, appetite change and unexpected weight change.  HENT: Positive for sneezing and sore throat. Negative for congestion, dental problem, postnasal drip, rhinorrhea, trouble swallowing and voice change.   Eyes: Negative for visual disturbance.  Respiratory: Positive for cough and shortness of breath. Negative for choking.   Cardiovascular: Positive for chest pain. Negative for leg swelling.  Gastrointestinal: Negative for nausea, vomiting and abdominal pain.  Genitourinary: Negative for difficulty urinating.  Musculoskeletal: Negative for arthralgias.  Skin: Negative for rash.  Psychiatric/Behavioral: Negative for behavioral problems and confusion.       Objective:   Physical Exam  amb wm nad  Wt Readings from Last 3 Encounters:  10/14/14 272 lb (123.378 kg)  10/06/14 274 lb (124.286 kg)  09/08/14 276 lb 9.6 oz (125.465 kg)    Vital signs reviewed  HEENT: nl dentition, turbinates, and orophanx. Nl external ear canals without cough reflex   NECK :  without JVD/Nodes/TM/ nl carotid upstrokes bilaterally   LUNGS: no acc muscle use, clear to A and P bilaterally without cough on insp or exp maneuvers   CV:  RRR  no s3 or murmur or increase in P2, no edema   ABD:  soft and nontender with nl excursion in the supine position. No bruits or organomegaly, bowel sounds nl  MS:  warm without deformities, calf tenderness, cyanosis or clubbing  SKIN: warm and dry without lesions    NEURO:  alert, approp, no deficits    Labs ordered today / images reviewed include:    cxr 10/14/13  Left base subsegmental atelectasis versus scarring.  Recent Labs Lab 10/14/14 1017  NA 138  K 4.3  CL 102  CO2 26  BUN 25*  CREATININE 1.02  GLUCOSE 266*    Recent Labs Lab 10/14/14 1017  HGB 13.0  HCT 39.2  WBC 3.4*  PLT 265.0     Lab Results  Component Value Date   TSH 1.15 10/14/2014     Lab Results  Component Value  Date   PROBNP 15.0 10/14/2014      Lab Results  Component Value Date   DDIMER 0.27 10/14/2014        Assessment & Plan:

## 2014-10-15 ENCOUNTER — Telehealth: Payer: Self-pay | Admitting: Family

## 2014-10-15 NOTE — Progress Notes (Signed)
Quick Note:  Spoke with pt and notified of results per Dr. Wert. Pt verbalized understanding and denied any questions.  ______ 

## 2014-10-15 NOTE — Telephone Encounter (Signed)
Needs PT/INR visit asap  

## 2014-10-15 NOTE — Telephone Encounter (Signed)
Pt cancelled protime for tomorrow and will call [facility] to see if they will sch his protime tomorrow. Pt is sch for ct scan tomorrow

## 2014-10-16 ENCOUNTER — Encounter: Payer: Self-pay | Admitting: Internal Medicine

## 2014-10-16 ENCOUNTER — Ambulatory Visit (INDEPENDENT_AMBULATORY_CARE_PROVIDER_SITE_OTHER): Payer: BLUE CROSS/BLUE SHIELD | Admitting: *Deleted

## 2014-10-16 ENCOUNTER — Ambulatory Visit: Payer: BC Managed Care – PPO | Admitting: Family

## 2014-10-16 ENCOUNTER — Ambulatory Visit (INDEPENDENT_AMBULATORY_CARE_PROVIDER_SITE_OTHER)
Admission: RE | Admit: 2014-10-16 | Discharge: 2014-10-16 | Disposition: A | Discharge status: Home / Self Care | Payer: BLUE CROSS/BLUE SHIELD | Source: Ambulatory Visit | Attending: Internal Medicine | Admitting: Internal Medicine

## 2014-10-16 DIAGNOSIS — R059 Cough, unspecified: Secondary | ICD-10-CM

## 2014-10-16 DIAGNOSIS — I2699 Other pulmonary embolism without acute cor pulmonale: Secondary | ICD-10-CM

## 2014-10-16 DIAGNOSIS — R05 Cough: Secondary | ICD-10-CM

## 2014-10-16 DIAGNOSIS — Z5181 Encounter for therapeutic drug level monitoring: Secondary | ICD-10-CM

## 2014-10-16 LAB — POCT INR: INR: 3.4

## 2014-10-16 MED ORDER — METOPROLOL TARTRATE 25 MG PO TABS: ORAL_TABLET | ORAL | Status: DC

## 2014-10-16 NOTE — Assessment & Plan Note (Addendum)
The most common causes of chronic cough in immunocompetent adults include the following: upper airway cough syndrome (UACS), previously referred to as postnasal drip syndrome (PNDS), which is caused by variety of rhinosinus conditions; (2) asthma; (3) GERD; (4) chronic bronchitis from cigarette smoking or other inhaled environmental irritants; (5) nonasthmatic eosinophilic bronchitis; and (6) bronchiectasis.   These conditions, singly or in combination, have accounted for up to 94% of the causes of chronic cough in prospective studies.   Other conditions have constituted no >6% of the causes in prospective studies These have included bronchogenic carcinoma, chronic interstitial pneumonia, sarcoidosis, left ventricular failure, ACEI-induced cough, and aspiration from a condition associated with pharyngeal dysfunction.    Chronic cough is often simultaneously caused by more than one condition. A single cause has been found from 38 to 82% of the time, multiple causes from 18 to 62%. Multiply caused cough has been the result of three diseases up to 42% of the time.       Based on hx and exam, this is most likely:  Classic Upper airway cough syndrome, so named because it's frequently impossible to sort out how much is  CR/sinusitis with freq throat clearing (which can be related to primary GERD)   vs  causing  secondary (" extra esophageal")  GERD from wide swings in gastric pressure that occur with throat clearing, often  promoting self use of mint and menthol lozenges that reduce the lower esophageal sphincter tone and exacerbate the problem further in a cyclical fashion.   These are the same pts (now being labeled as having "irritable larynx syndrome" by some cough centers) who not infrequently have a history of having failed to tolerate ace inhibitors and occasionally generic cozar now reported - see hbp)  dry powder inhalers or biphosphonates or report having atypical reflux symptoms that don't respond  to standard doses of PPI , and are easily confused as having aecopd or asthma flares by even experienced allergists/ pulmonologists.   The first step is to maximize acid suppression and eliminate cyclical coughing and proceed with sinus CT then regroup in 1 week See instructions for specific recommendations which were reviewed directly with the patient who was given a copy with highlighter outlining the key components.

## 2014-10-16 NOTE — Assessment & Plan Note (Signed)
-   10/14/2014  Walked RA x 3 laps @ 185 ft each stopped due to  End of study nl pace, some bilat leg pain, min sob, no desat   Mild and chronic doe likely related to wt/ not airways dz or lung dz in general, no further w/u needed until solve the cough

## 2014-10-16 NOTE — Progress Notes (Signed)
Quick Note:  Spoke with pt and notified of results per Dr. Wert. Pt verbalized understanding and denied any questions.  ______ 

## 2014-10-16 NOTE — Assessment & Plan Note (Signed)
D/c'd cozar 10/14/14 due to cough/ increased lopressor to 50 bid due to tachycardia  Comment For reasons that may related to vascular permability and nitric oxide pathways but not elevated  bradykinin levels (as seen with  ACEi use) losartan in the generic form has been reported now from mulitple sources  to cause a similar pattern of non-specific  upper airway symptoms as seen with acei.   This has not been reported with exposure to the other ARB's to date, so it seems reasonable for now to try either generic diovan or avapro if ARB needed or use an alternative class altogether.  See:  [facility] Immunol  2008: 101: p [phone]

## 2014-10-21 ENCOUNTER — Ambulatory Visit (INDEPENDENT_AMBULATORY_CARE_PROVIDER_SITE_OTHER): Payer: BLUE CROSS/BLUE SHIELD | Admitting: Internal Medicine

## 2014-10-21 ENCOUNTER — Encounter: Payer: Self-pay | Admitting: Internal Medicine

## 2014-10-21 VITALS — BP 118/70 | HR 85 | Ht 72.0 in | Wt 272.0 lb

## 2014-10-21 DIAGNOSIS — R059 Cough, unspecified: Secondary | ICD-10-CM

## 2014-10-21 DIAGNOSIS — R0600 Dyspnea, unspecified: Secondary | ICD-10-CM

## 2014-10-21 DIAGNOSIS — R05 Cough: Secondary | ICD-10-CM

## 2014-10-21 DIAGNOSIS — I10 Essential (primary) hypertension: Secondary | ICD-10-CM

## 2014-10-21 MED ORDER — GABAPENTIN 300 MG PO CAPS: 300.0000 mg | ORAL_CAPSULE | Freq: Three times a day (TID) | ORAL | Status: AC

## 2014-10-21 MED ORDER — METOPROLOL TARTRATE 50 MG PO TABS: 50.0000 mg | ORAL_TABLET | Freq: Two times a day (BID) | ORAL | Status: DC

## 2014-10-21 NOTE — Progress Notes (Signed)
Subjective:    Patient ID: [name], male    DOB: 05/28/1951,   MRN: [id number]    Brief patient profile:  84 yowm never smoker with h/o lifelong perennial rhinitis allergies to dust, no response to shots per [facility] but never asthma then dx FV leiden Mutation on lifelong coumadin p PE around 2010 fine until onset of ha/nasal discharge/sorethroat cough late October 2015 never really better desptie multiple abx then onset sob mid Dec 2015 > w/u by cardiology neg > severe incessant cough early Jan 2016 > UC > steroids/ abx > some better then diarrhea > dx with abx assoc diarrhea so keflex stopped and referred to pulmonary clinic 10/14/2014    History of Present Illness  10/14/2014 1st [place] [facility] office visit [facility]/ Christropher Gintz   Chief Complaint  Patient presents with  . Pulmonary Consult    Self referral. Pt c/o cough and SOB for the past 2 months. He states that he was dxed with bronchitis and sinus infection recently. Cough is prod with moderate white sputum. He also c/o hoarseness. He states "always" have had tendency to be SOB with exertion.   mucus presently white/ hurts in midline anteriorly just when  cough and overall a little better but still persistent coughing 24/7, unable to lie flat, sitting up in recliner and tol usual cpap poorly tolerating. rec schedule a sinus ct asap> neg  Pepcid ac 20 mg after bfast and at bedtime  Stop fish oil, prilosec, zyrtrec  For drainage take chlortrimeton (chlorpheniramine) 4 mg every 4 hours available over the counter (may cause drowsiness)  Take delsym two tsp every 12 hours and supplement if needed with  tramadol 50 mg up to 2 every 4 hours to suppress the urge to cough.  Once you have eliminated the cough for 3 straight days try reducing the tramadol first,  then the delsym as tolerated.   Stop losatan and increase lopressor to  Two every 12 hours  Diet:  Avoid dairy products except yogurt, undercooked veggies and all salads, eat  more broth based soups, noodles/ crackers  GERD  Diet    10/21/2014 f/u ov/Aadhya Bustamante re: dyspnea better/ persistent cough/ throat irritation since oct 2015  Chief Complaint  Patient presents with  . Follow-up    Pt states that his SOB and cough are much improved. No new co's today.     Diarrhea gone, able to lie flat for sinus ct but hasn't been able to lie flat even on cpap x 10 years due to obesity  No longer needing tramadol but has freq  urge to clear throat day >> noct   No obvious day to day or daytime variabilty or assoc   cp or chest tightness, subjective wheeze overt sinus or hb symptoms. No unusual exp hx or h/o childhood pna/ asthma or knowledge of premature birth.  Sleeping ok without nocturnal  or early am exacerbation  of respiratory  c/o's or need for noct saba. Also denies any obvious fluctuation of symptoms with weather or environmental changes or other aggravating or alleviating factors except as outlined above   Current Medications, Allergies, Complete Past Medical History, Past Surgical History, Family History, and Social History were reviewed in [employer] record.  ROS  The following are not active complaints unless bolded sore throat, dysphagia, dental problems, itching, sneezing,  nasal congestion or excess/ purulent secretions, ear ache,   fever, chills, sweats, unintended wt loss, pleuritic or exertional cp, hemoptysis,  orthopnea pnd or leg swelling, presyncope, palpitations, heartburn, abdominal pain, anorexia, nausea, vomiting, diarrhea  or change in bowel or urinary habits, change in stools or urine, dysuria,hematuria,  rash, arthralgias, visual complaints, headache, numbness weakness or ataxia or problems with walking or coordination,  change in mood/affect or memory.           Objective:   Physical Exam  amb wm nad  10/21/2014        272  Wt Readings from Last 3 Encounters:  10/14/14 272 lb (123.378 kg)  10/06/14 274 lb (124.286 kg)    09/08/14 276 lb 9.6 oz (125.465 kg)    Vital signs reviewed  HEENT: nl dentition, turbinates, and orophanx. Nl external ear canals without cough reflex   NECK :  without JVD/Nodes/TM/ nl carotid upstrokes bilaterally   LUNGS: no acc muscle use, clear to A and P bilaterally without cough on insp or exp maneuvers   CV:  RRR  no s3 or murmur or increase in P2, no edema   ABD:  soft and nontender with nl excursion in the supine position. No bruits or organomegaly, bowel sounds nl  MS:  warm without deformities, calf tenderness, cyanosis or clubbing  SKIN: warm and dry without lesions    NEURO:  alert, approp, no deficits    Labs ordered last ov/ images reviewed include:   cxr 10/14/13  Left base subsegmental atelectasis versus scarring.  Recent Labs Lab 10/14/14 1017  NA 138  K 4.3  CL 102  CO2 26  BUN 25*  CREATININE 1.02  GLUCOSE 266*    Recent Labs Lab 10/14/14 1017  HGB 13.0  HCT 39.2  WBC 3.4*  PLT 265.0     Lab Results  Component Value Date   TSH 1.15 10/14/2014     Lab Results  Component Value Date   PROBNP 15.0 10/14/2014      Lab Results  Component Value Date   DDIMER 0.27 10/14/2014        Assessment & Plan:

## 2014-10-21 NOTE — Patient Instructions (Addendum)
Increase Neurontin 300 mg three times daily   Change lopressor to 50 mg twice daily   Take delsym two tsp every 12 hours and supplement if needed with  tramadol 50 mg up to 2 every 4 hours to suppress the urge to cough. Swallowing water or using ice chips/non mint and menthol containing candies (such as lifesavers or sugarless jolly ranchers) are also effective.  You should rest your voice and avoid activities that you know make you cough.  Once you have eliminated the cough for 3 straight days try reducing the tramadol first,  then the delsym as tolerated.     Please schedule a follow up office visit in 6 weeks, call sooner if needed  Add ? Needs pfts on return if still sob

## 2014-10-22 ENCOUNTER — Other Ambulatory Visit: Payer: Self-pay | Admitting: Family Medicine

## 2014-10-22 ENCOUNTER — Encounter: Payer: Self-pay | Admitting: Internal Medicine

## 2014-10-22 NOTE — Assessment & Plan Note (Signed)
.   10/14/2014  Walked RA x 3 laps @ 185 ft each stopped due to  End of study nl pace, some bilat leg pain, min sob, no desat   Reviewed w/u to date with pt, no evidence of recurrent pe/ chf/ asthma > will need pfts on f/u if still sob p cough controlled

## 2014-10-22 NOTE — Assessment & Plan Note (Signed)
-    trial off cozar 10/14/14  - sinus CT 10/16/2014 > Mild right maxillary sinus mucosal thickening, consistent chronic sinusitis. Remaining paranasal sinuses are clear.> rec trial of chlortrimeton   Cough is some better but still excessive daytime throat clearing c/w   Upper airway cough syndrome, so named because it's frequently impossible to sort out how much is  CR/sinusitis with freq throat clearing (which can be related to primary GERD)   vs  causing  secondary (" extra esophageal")  GERD from wide swings in gastric pressure that occur with throat clearing, often  promoting self use of mint and menthol lozenges that reduce the lower esophageal sphincter tone and exacerbate the problem further in a cyclical fashion.   These are the same pts (now being labeled as having "irritable larynx syndrome" by some cough centers) who not infrequently have a history of having failed to tolerate ace inhibitors,  dry powder inhalers or biphosphonates or report having atypical reflux symptoms that don't respond to standard doses of PPI , and are easily confused as having aecopd or asthma flares by even experienced allergists/ pulmonologists.   For now continue h1/h2 but try increase neurontin (which he takes sporadically anyway) to 300 tid if tolerates   See instructions for specific recommendations which were reviewed directly with the patient who was given a copy with highlighter outlining the key components.

## 2014-10-22 NOTE — Telephone Encounter (Signed)
Yes may fill 

## 2014-10-22 NOTE — Assessment & Plan Note (Signed)
D/c'd cozar 10/14/14 due to cough/ increased lopressor to 50 bid due to tachycardia  Improved 10/21/14 with resolution of tachycardia  so rec remain off cozar, on lopressor for now

## 2014-10-22 NOTE — Telephone Encounter (Signed)
Is this ok to refill?  

## 2014-10-23 NOTE — Telephone Encounter (Signed)
Medication called in 

## 2014-11-07 ENCOUNTER — Other Ambulatory Visit: Payer: Self-pay | Admitting: Cardiovascular Disease

## 2014-11-07 ENCOUNTER — Encounter: Payer: Self-pay | Admitting: Internal Medicine

## 2014-11-07 DIAGNOSIS — J310 Chronic rhinitis: Secondary | ICD-10-CM | POA: Insufficient documentation

## 2014-11-13 ENCOUNTER — Ambulatory Visit (INDEPENDENT_AMBULATORY_CARE_PROVIDER_SITE_OTHER): Payer: BLUE CROSS/BLUE SHIELD | Admitting: General Practice

## 2014-11-13 DIAGNOSIS — Z5181 Encounter for therapeutic drug level monitoring: Secondary | ICD-10-CM

## 2014-11-13 DIAGNOSIS — I2699 Other pulmonary embolism without acute cor pulmonale: Secondary | ICD-10-CM

## 2014-11-13 LAB — POCT INR: INR: 2

## 2014-11-13 NOTE — Progress Notes (Signed)
Pre visit review using our clinic review tool, if applicable. No additional management support is needed unless otherwise documented below in the visit note. 

## 2014-12-02 ENCOUNTER — Ambulatory Visit: Payer: BLUE CROSS/BLUE SHIELD | Admitting: Internal Medicine

## 2014-12-03 ENCOUNTER — Ambulatory Visit: Payer: BLUE CROSS/BLUE SHIELD | Admitting: Internal Medicine

## 2014-12-11 ENCOUNTER — Ambulatory Visit (INDEPENDENT_AMBULATORY_CARE_PROVIDER_SITE_OTHER): Payer: BLUE CROSS/BLUE SHIELD | Admitting: General Practice

## 2014-12-11 DIAGNOSIS — Z5181 Encounter for therapeutic drug level monitoring: Secondary | ICD-10-CM

## 2014-12-11 DIAGNOSIS — I2699 Other pulmonary embolism without acute cor pulmonale: Secondary | ICD-10-CM | POA: Diagnosis not present

## 2014-12-11 LAB — POCT INR: INR: 1.6

## 2014-12-11 NOTE — Progress Notes (Signed)
Pre visit review using our clinic review tool, if applicable. No additional management support is needed unless otherwise documented below in the visit note. 

## 2014-12-18 LAB — HM DIABETES EYE EXAM

## 2014-12-22 ENCOUNTER — Encounter: Payer: Self-pay | Admitting: Family Medicine

## 2015-01-04 ENCOUNTER — Other Ambulatory Visit: Payer: Self-pay | Admitting: Cardiovascular Disease

## 2015-01-06 ENCOUNTER — Other Ambulatory Visit: Payer: Self-pay | Admitting: Internal Medicine

## 2015-01-08 ENCOUNTER — Ambulatory Visit (INDEPENDENT_AMBULATORY_CARE_PROVIDER_SITE_OTHER): Payer: BLUE CROSS/BLUE SHIELD | Admitting: General Practice

## 2015-01-08 DIAGNOSIS — Z5181 Encounter for therapeutic drug level monitoring: Secondary | ICD-10-CM | POA: Diagnosis not present

## 2015-01-08 DIAGNOSIS — I2699 Other pulmonary embolism without acute cor pulmonale: Secondary | ICD-10-CM | POA: Diagnosis not present

## 2015-01-08 LAB — POCT INR: INR: 2.2

## 2015-01-08 NOTE — Progress Notes (Signed)
Pre visit review using our clinic review tool, if applicable. No additional management support is needed unless otherwise documented below in the visit note. 

## 2015-01-10 ENCOUNTER — Other Ambulatory Visit: Payer: Self-pay | Admitting: Family

## 2015-01-14 ENCOUNTER — Ambulatory Visit (INDEPENDENT_AMBULATORY_CARE_PROVIDER_SITE_OTHER): Payer: BLUE CROSS/BLUE SHIELD | Admitting: Family Medicine

## 2015-01-14 ENCOUNTER — Encounter: Payer: Self-pay | Admitting: Family Medicine

## 2015-01-14 VITALS — BP 110/60 | HR 85 | Temp 98.1°F | Wt 279.0 lb

## 2015-01-14 DIAGNOSIS — I10 Essential (primary) hypertension: Secondary | ICD-10-CM

## 2015-01-14 DIAGNOSIS — M47812 Spondylosis without myelopathy or radiculopathy, cervical region: Secondary | ICD-10-CM | POA: Insufficient documentation

## 2015-01-14 DIAGNOSIS — J309 Allergic rhinitis, unspecified: Secondary | ICD-10-CM | POA: Insufficient documentation

## 2015-01-14 DIAGNOSIS — D72819 Decreased white blood cell count, unspecified: Secondary | ICD-10-CM

## 2015-01-14 DIAGNOSIS — E785 Hyperlipidemia, unspecified: Secondary | ICD-10-CM

## 2015-01-14 DIAGNOSIS — E1165 Type 2 diabetes mellitus with hyperglycemia: Secondary | ICD-10-CM

## 2015-01-14 LAB — HEMOGLOBIN A1C: Hgb A1c MFr Bld: 8.2 % — ABNORMAL HIGH (ref 4.6–6.5)

## 2015-01-14 LAB — LDL CHOLESTEROL, DIRECT: LDL DIRECT: 90 mg/dL

## 2015-01-14 MED ORDER — METOPROLOL SUCCINATE ER 25 MG PO TB24: 25.0000 mg | ORAL_TABLET | Freq: Every day | ORAL | Status: AC

## 2015-01-14 NOTE — Assessment & Plan Note (Signed)
S: knows he needs to lose weight, last LDL at goal <100 though <70 would be more ideal A/P: check LDL today, adjust only if >147>100 LDL but push through diet/exercise for lower

## 2015-01-14 NOTE — Patient Instructions (Addendum)
Stop glipizide  Update a1c. Schedule a visit with me in 2-3 weeks and log your sugar at least a week before. When you come back for this we will give you the handicap form.   Update cholesterol  No other changes  Renal artery stenosis-Unclear how diagnosed. Noted 01/16/2009. Review of ct's in past do not indicate this. Patient to ask Dr. Retta Dionesahlstedt to see if any more information.

## 2015-01-14 NOTE — Assessment & Plan Note (Signed)
S: controlled on Chlorthalidone 25mg , metoprolol 25mg  XL A/P: continue current rx

## 2015-01-14 NOTE — Progress Notes (Signed)
Tana Conch, MD Phone: [phone]  Subjective:  Patient presents today to establish care with me as their new primary care provider. Patient was formerly a patient of Dr. Cato Mulligan. Chief complaint-noted.   See problem oriented charting and problem list overview's-History was fully updated in either history or overview today ROS- no hypoglycemia, blurry vision. Shortness of breath with walking at baseline. No chest pain.   The following were reviewed and entered/updated in epic: Past Medical History  Diagnosis Date  . Hyperlipidemia   . SLEEP APNEA, OBSTRUCTIVE 01/16/2009  . ESOPHAGEAL STRICTURE 01/16/2009  . PSORIASIS     uses a cream  . PULMONARY EMBOLISM     takes Coumadin daily  . NEPHROLITHIASIS, HX OF 02/27/2009  . ANEMIA-UNSPECIFIED 04/28/2010  . CAD (coronary artery disease)     a. s/p DES to RCA x2 in 2005. b. s/p DES to CFX 2009. c. s/p DES PTCA of RCA for in stent restenosis 03/2010. d. s/p Promus DES x2 to RCA 07/16/10(recurrent pain with abnormal Myoview 06/2010; residual nonobstructive disease at cath.  . Leukopenia 11/11/2011  . DEPRESSION   . HYPERTENSION   . GERD   . PONV (postoperative nausea and vomiting)   . Peripheral edema   . Peripheral neuropathy   . Arthritis   . Joint pain   . Seasonal allergies   . History of colon polyps   . History of kidney stones   . DIABETES MELLITUS-TYPE II 09/01/2010  . Plavix resistance   . Heterozygous factor V Leiden mutation   . Obesity    Patient Active Problem List   Diagnosis Date Noted  . Dyspnea 10/14/2014    Priority: High  . H/o Pulmonary embolism (Factor V Leiden heterozygous) 11/08/2010    Priority: High  . Poorly controlled diabetes mellitus 09/01/2010    Priority: High  . Factor V Leiden 01/23/2009    Priority: High  . CAD (coronary artery disease) 04/12/2007    Priority: High  . Cervical arthritis 01/14/2015    Priority: Medium  . Leukopenia 11/11/2011    Priority: Medium  . Depression 03/08/2009   Priority: Medium  . OSA on CPAP 01/16/2009    Priority: Medium  . RENAL ARTERY STENOSIS 01/16/2009    Priority: Medium  . Psoriasis 01/16/2009    Priority: Medium  . Hyperlipidemia 04/12/2007    Priority: Medium  . Essential hypertension 04/12/2007    Priority: Medium  . Allergic rhinitis 01/14/2015    Priority: Low  . Chronic rhinitis 11/07/2014    Priority: Low  . Cough 10/14/2014    Priority: Low  . Encounter for therapeutic drug monitoring 11/14/2013    Priority: Low  . Sinus tachycardia 11/27/2012    Priority: Low  . OBESITY-MORBID (>100') 01/16/2009    Priority: Low  . GERD 04/12/2007    Priority: Low   Past Surgical History  Procedure Laterality Date  . Knee arthroscopy  2007    left knee x 2  . Angioplasty  2005 & 2011  . Esophagogastroduodenoscopy    . Colonoscopy    . Carpal tunnel release Bilateral   . Coronary angioplasty with stent placement  2005/2009    5 stents   . Cystoscopy      x 2 due to kidney stones    Family History  Problem Relation Age of Onset  . Pulmonary embolism Mother   . Factor V Leiden deficiency Mother   . Diabetes Brother   . Heart failure Brother   . Hypertension Brother   .  Asthma Mother   . Factor V Leiden deficiency Brother     Medications- reviewed and updated Current Outpatient Prescriptions  Medication Sig Dispense Refill  . atorvastatin (LIPITOR) 40 MG tablet Take 1 tablet (40 mg total) by mouth daily. 90 tablet 3  . B-D ULTRAFINE III SHORT PEN 31G X 8 MM MISC USE AS DIRECTED TWICE A DAY 100 each 6  . chlorthalidone (HYGROTON) 25 MG tablet Take 25 mg by mouth every morning.    . citalopram (CELEXA) 20 MG tablet TAKE 1 TABLET BY MOUTH EVERY DAY 30 tablet 4  . clobetasol cream (TEMOVATE) 0.05 % Apply 1 application topically 2 (two) times daily as needed (for rash).    Marland Kitchen. EFFIENT 5 MG TABS tablet TAKE 1 TABLET BY MOUTH DAILY 30 tablet 5  . fluticasone (FLONASE) 50 MCG/ACT nasal spray Place 2 sprays into both nostrils  daily. 16 g 6  . gabapentin (NEURONTIN) 300 MG capsule Take 1 capsule (300 mg total) by mouth 3 (three) times daily. 90 capsule 11  . glipiZIDE (GLUCOTROL) 10 MG tablet TAKE 1 TABLET BY MOUTH TWICE A DAY BEFORE A MEAL 60 tablet 5  . insulin aspart (NOVOLOG) 100 UNIT/ML injection Inject 44 Units into the skin 4 (four) times daily.    . Insulin Detemir (LEVEMIR FLEXTOUCH) 100 UNIT/ML Pen Inject 60 Units into the skin 2 (two) times daily. Or as directed 20 pen 11  . nystatin cream (MYCOSTATIN) Apply 1 application topically as needed (Patient stated this is PRN for yeast infection).     . Potassium Citrate 15 MEQ (1620 MG) TBCR Take 1 tablet by mouth 3 (three) times daily.     Marland Kitchen. warfarin (COUMADIN) 5 MG tablet TAKE AS DIRECTED BY ANTICOAGULATION CLINIC 35 tablet 3  . furosemide (LASIX) 20 MG tablet Take 1 tablet (20 mg total) by mouth as needed for fluid. (Patient not taking: Reported on 01/14/2015) 30 tablet 5  . losartan (COZAAR) 100 MG tablet   2  . nitroGLYCERIN (NITROSTAT) 0.4 MG SL tablet Place 1 tablet (0.4 mg total) under the tongue every 5 (five) minutes as needed. (Patient not taking: Reported on 01/14/2015) 25 tablet 3  . omeprazole (PRILOSEC) 20 MG capsule   3   No current facility-administered medications for this visit.    Allergies-reviewed and updated Allergies  Allergen Reactions  . Piroxicam Other (See Comments)    Blistering on hands  . Rofecoxib Other (See Comments)    REACTION: Reaction not known    History   Social History  . Marital Status: Married    Spouse Name: N/A  . Number of Children: N/A  . Years of Education: N/A   Occupational History  . Real Estate Appraisal     Social History Main Topics  . Smoking status: Never Smoker   . Smokeless tobacco: Never Used  . Alcohol Use: No  . Drug Use: No  . Sexual Activity: Yes   Other Topics Concern  . None   Social History Narrative   Married. 2 children. 3 grandkids.       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 No rebound or guarding. Morbidly obese Ext:  trace edema Skin: warm, dry, no rash  Neuro: grossly normal, moves all extremities, PERRLA   Diabetic Foot Exam - Simple   Simple Foot Form  Diabetic Foot exam was performed with the following findings:  Yes 01/14/2015 12:37 PM  Visual Inspection  No deformities, no ulcerations, no other skin breakdown bilaterally:  Yes  Sensation Testing  Intact to touch and monofilament testing bilaterally:  Yes  Pulse Check  Posterior Tibialis and Dorsalis pulse intact bilaterally:  Yes  Comments     Assessment/Plan:  Poorly controlled diabetes mellitus S: patient not logging sugars routinely but thinks they are running higher. he is on aspart 44 units 3x a day with meals and at bedtime and 60 units levemir BID as well as glipizide A/P: with such high levels of insulin doubt glipizide is helpful so asked him to stop. He will start checking CBGs at least a week before seeing me and bring log within 2-3 weeks. Check a1c today. I would consider addition of metformin and/or invokana.     Hyperlipidemia S: knows he needs to lose weight, last LDL at goal <100 though <70 would be more ideal A/P: check LDL today, adjust only if >914 LDL but push through diet/exercise for lower    Essential hypertension S: controlled on Chlorthalidone , metoprolol  XL A/P: continue current rx    follow up DM visit in 2-3 weeks. Give him the handicap form he submitted today at that time. Check HIV with leukopenia as well.   Orders Placed This Encounter  Procedures  . Hemoglobin A1c  . HIV antibody    [facility]  . LDL  cholesterol, direct    [facility]    Meds ordered this encounter  Medications  . omeprazole (PRILOSEC) 20 MG capsule    Sig:     Refill:  3  . losartan (COZAAR) 100 MG tablet    Sig:     Refill:  2  . metoprolol succinate (TOPROL-XL) 25 MG 24 hr tablet    Sig: Take 1 tablet (25 mg total) by mouth daily.    Dispense:  30 tablet    Refill:  5

## 2015-01-14 NOTE — Assessment & Plan Note (Signed)
S: patient not logging sugars routinely but thinks they are running higher. he is on aspart 44 units 3x a day with meals and at bedtime and 60 units levemir BID as well as glipizide A/P: with such high levels of insulin doubt glipizide is helpful so asked him to stop. He will start checking CBGs at least a week before seeing me and bring log within 2-3 weeks. Check a1c today. I would consider addition of metformin and/or invokana.

## 2015-01-15 LAB — HIV ANTIBODY (ROUTINE TESTING W REFLEX): HIV 1&2 Ab, 4th Generation: NONREACTIVE

## 2015-01-19 ENCOUNTER — Other Ambulatory Visit: Payer: Self-pay | Admitting: Cardiovascular Disease

## 2015-01-21 ENCOUNTER — Ambulatory Visit (INDEPENDENT_AMBULATORY_CARE_PROVIDER_SITE_OTHER): Payer: BLUE CROSS/BLUE SHIELD | Admitting: Cardiovascular Disease

## 2015-01-21 ENCOUNTER — Encounter: Payer: Self-pay | Admitting: Cardiovascular Disease

## 2015-01-21 VITALS — BP 138/80 | HR 91 | Ht 72.0 in | Wt 272.4 lb

## 2015-01-21 DIAGNOSIS — I10 Essential (primary) hypertension: Secondary | ICD-10-CM | POA: Diagnosis not present

## 2015-01-21 DIAGNOSIS — I2510 Atherosclerotic heart disease of native coronary artery without angina pectoris: Secondary | ICD-10-CM | POA: Diagnosis not present

## 2015-01-21 MED ORDER — VALSARTAN 40 MG PO TABS: 40.0000 mg | ORAL_TABLET | Freq: Every day | ORAL | Status: AC

## 2015-01-21 NOTE — Patient Instructions (Signed)
Medication Instructions:  Your physician has recommended you make the following change in your medication:  1. START Diovan (valsartan) 40mg  take one tablet by mouth daily  Labwork: No new orders.  Testing/Procedures: No new orders.  Follow-Up: Your physician wants you to follow-up in: 6 MONTHS with PA/NP Kriste Basque(Dayna Dunn). You will receive a reminder letter in the mail two months in advance. If you don't receive a letter, please call our office to schedule the follow-up appointment.  Your physician wants you to follow-up in: 1 YEAR with Dr Excell Seltzerooper.  You will receive a reminder letter in the mail two months in advance. If you don't receive a letter, please call our office to schedule the follow-up appointment.   Any Other Special Instructions Will Be Listed Below (If Applicable).

## 2015-01-21 NOTE — Progress Notes (Signed)
Cardiology Office Note   Date:  01/22/2015   ID:  Adam PilotJerry L [name], DOB March 14, 1951, MRN [id number]  PCP:  Tana ConchHUNTER, STEPHEN, MD  Cardiologist:  Tonny BollmanMichael Jinna Weinman, MD    Chief Complaint  Patient presents with  . Hyperlipidemia     History of Present Illness: Adam HumphreyJerry L [name] is a 64 y.o. male who presents for follow-up evaluation. He has a history of coronary artery disease status post multiple PCI procedures. He's been treated with both first and second generation drug-eluting stents. His most recent PCI was in 2011. He has multiple stents in the right coronary artery and also has undergone stenting of the left circumflex. He has a history of pulmonary embolus and is heterozygous for factor V Leiden. He's been maintained on long-term anticoagulation. Because of Plavix hyperresponsiveness and recurrent ACS presentations, he was switched to low-dose effient 5 mg and has done well on this combination of effient and warfarin without aspirin.  He had an upper respiratory infection several months ago and was eventually evaluated by Dr Sherene SiresWert for pulmonary disease. His losartan was discontinued because of concern about upper airway symptoms with thoughts that this was contributing.   Patient is doing well from a cardiac perspective. He denies chest pain or pressure. He continues to have dyspnea with exertion when walking up stairs. He has a nonproductive cough. He denies leg swelling, orthopnea, or PND.  Past Medical History  Diagnosis Date  . Hyperlipidemia   . SLEEP APNEA, OBSTRUCTIVE 01/16/2009  . ESOPHAGEAL STRICTURE 01/16/2009  . PSORIASIS     uses a cream  . PULMONARY EMBOLISM     takes Coumadin daily  . NEPHROLITHIASIS, HX OF 02/27/2009  . ANEMIA-UNSPECIFIED 04/28/2010  . CAD (coronary artery disease)     a. s/p DES to RCA x2 in 2005. b. s/p DES to CFX 2009. c. s/p DES PTCA of RCA for in stent restenosis 03/2010. d. s/p Promus DES x2 to RCA 07/16/10(recurrent pain with abnormal Myoview 06/2010;  residual nonobstructive disease at cath.  . Leukopenia 11/11/2011  . DEPRESSION   . HYPERTENSION   . GERD   . PONV (postoperative nausea and vomiting)   . Peripheral edema   . Peripheral neuropathy   . Arthritis   . Joint pain   . Seasonal allergies   . History of colon polyps   . History of kidney stones   . DIABETES MELLITUS-TYPE II 09/01/2010  . Plavix resistance   . Heterozygous factor V Leiden mutation   . Obesity     Past Surgical History  Procedure Laterality Date  . Knee arthroscopy  2007    left knee x 2  . Angioplasty  2005 & 2011  . Esophagogastroduodenoscopy    . Colonoscopy    . Carpal tunnel release Bilateral   . Coronary angioplasty with stent placement  2005/2009    5 stents   . Cystoscopy      x 2 due to kidney stones    Current Outpatient Prescriptions  Medication Sig Dispense Refill  . atorvastatin (LIPITOR) 40 MG tablet Take 1 tablet (40 mg total) by mouth daily. 90 tablet 3  . B-D ULTRAFINE III SHORT PEN 31G X 8 MM MISC USE AS DIRECTED TWICE A DAY 100 each 6  . chlorthalidone (HYGROTON) 25 MG tablet Take 25 mg by mouth every morning.    . citalopram (CELEXA) 20 MG tablet TAKE 1 TABLET BY MOUTH EVERY DAY 30 tablet 4  . clobetasol cream (TEMOVATE) 0.05 % Apply 1  application topically 2 (two) times daily as needed (for rash).    Marland Kitchen EFFIENT 5 MG TABS tablet TAKE 1 TABLET BY MOUTH DAILY 30 tablet 2  . fluticasone (FLONASE) 50 MCG/ACT nasal spray Place 2 sprays into both nostrils daily. 16 g 6  . furosemide (LASIX) 20 MG tablet Take 20 mg by mouth continuous as needed for fluid or edema (Take one tablet  by mouth as needed for fluid rentinion).    . gabapentin (NEURONTIN) 300 MG capsule Take 1 capsule (300 mg total) by mouth 3 (three) times daily. 90 capsule 11  . Insulin Detemir (LEVEMIR FLEXTOUCH) 100 UNIT/ML Pen Inject 60 Units into the skin 2 (two) times daily. Or as directed 20 pen 11  . metoprolol succinate (TOPROL-XL) 25 MG 24 hr tablet Take 1 tablet  (25 mg total) by mouth daily. 30 tablet 5  . nitroGLYCERIN (NITROSTAT) 0.4 MG SL tablet Place 0.4 mg under the tongue every 5 (five) minutes as needed for chest pain.    Marland Kitchen NOVOLOG 100 UNIT/ML injection INJECT 44 UNITS INTO THE SKIN 3 (THREE) TIMES DAILY BEFORE MEALS. 50 mL 9  . nystatin cream (MYCOSTATIN) Apply 1 application topically as needed (Patient stated this is PRN for yeast infection).     Marland Kitchen omeprazole (PRILOSEC) 20 MG capsule Take 20 mg by mouth at bedtime.   3  . Potassium Citrate 15 MEQ (1620 MG) TBCR Take 1 tablet by mouth 3 (three) times daily.     Marland Kitchen warfarin (COUMADIN) 5 MG tablet TAKE AS DIRECTED BY ANTICOAGULATION CLINIC 35 tablet 3  . valsartan (DIOVAN) 40 MG tablet Take 1 tablet (40 mg total) by mouth daily. 90 tablet 3   No current facility-administered medications for this visit.    Allergies:   Piroxicam and Rofecoxib   Social History:  The patient  reports that he 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 He has never used smokeless tobacco. He reports that he does not drink alcohol or use illicit drugs.   Family History:  The patient's family history includes Asthma in his mother; Diabetes in his brother; Factor V Leiden deficiency in his brother and mother; Heart failure in his brother; Hypertension in his brother; Pulmonary embolism in his mother.   ROS:  Please see the history of present illness.  Otherwise, review of systems is positive for leg pain, DOE, headaches.  All other systems are reviewed and negative.   PHYSICAL EXAM: VS:  BP 138/80 mmHg  Pulse 91  Ht 6' (1.829 m)  Wt 272 lb 6.4 oz (123.56 kg)  BMI 36.94 kg/m2 , BMI Body mass index is 36.94 kg/(m^2). GEN: Well nourished, well developed, pleasant obese male in no acute distress HEENT: normal Neck: no JVD, no masses. No carotid bruits Cardiac: RRR without murmur or gallop                Respiratory:  clear to auscultation bilaterally, normal work of breathing GI: soft, nontender, nondistended, + BS MS: no deformity or  atrophy Ext: no pretibial edema, pedal pulses 2+= bilaterally Skin: warm and dry, no rash Neuro:  Strength and sensation are intact Psych: euthymic mood, full affect  EKG:  EKG is not ordered today.  Recent Labs: 10/14/2014: ALT 28; BUN 25*; Creatinine 1.02; Hemoglobin 13.0; Platelets 265.0; Potassium 4.3; Pro B Natriuretic peptide (BNP) 15.0; Sodium 138; TSH 1.15   Lipid Panel     Component Value Date/Time   CHOL 200 07/18/2014 0941   TRIG * 07/18/2014 0941    608.0  Triglyceride is over 400; calculations on Lipids are invalid.   HDL 26.90* 07/18/2014 0941   CHOLHDL 7 07/18/2014 0941   VLDL 121.6* 07/18/2014 0941   LDLCALC 76 03/13/2014 0929   LDLDIRECT 90.0 01/14/2015 1239      Wt Readings from Last 3 Encounters:  01/21/15 272 lb 6.4 oz (123.56 kg)  01/14/15 279 lb (126.554 kg)  10/21/14 272 lb (123.378 kg)    ASSESSMENT AND PLAN: 1. Coronary artery disease, native vessel. The patient is stable without symptoms of angina. Will continue him on low-dose effient in the setting of chronic warfarin use and hx of stent thrombosis with documented clopidogrel hyporesponsiveness. Will continue his other medications without changes.   2. Hyperlipidemia. The patient will continue on atorvastatin. Triglycerides elevated (see labs above). Suspect a combination of poor glycemic control and obesity.   3. Hypertension. Blood pressure well controlled on a combination of metoprolol succinate and chlorthalidone. He is off losartan with upper airway issues, but clearly should be on an ACE or ARB with diabetes, CAD, and hx MI. Will start Diovan 40 mg.   4. History of pulmonary embolus with factor V Leiden. He maintains on long-term anticoagulation with warfarin. Reports no bleeding problems.  5. Obesity. Counseling regarding diet, exercise, and weight loss was done.  Current medicines are reviewed with the patient today.  The patient does not have concerns regarding medicines.  Labs/ tests  ordered today include:  No orders of the defined types were placed in this encounter.    Disposition:   FU 6 months with PA/NP and one year with me.  Signed, Tonny Bollman, MD  01/22/2015 6:17 AM    [facility] [address] [place], [place], [place]  [address] Phone: [phone]; Fax: [phone]

## 2015-01-22 ENCOUNTER — Other Ambulatory Visit: Payer: Self-pay | Admitting: Internal Medicine

## 2015-01-28 ENCOUNTER — Other Ambulatory Visit: Payer: Self-pay | Admitting: Family Medicine

## 2015-02-05 ENCOUNTER — Ambulatory Visit: Payer: BLUE CROSS/BLUE SHIELD

## 2015-02-08 ENCOUNTER — Other Ambulatory Visit: Payer: Self-pay | Admitting: Internal Medicine

## 2015-02-09 ENCOUNTER — Ambulatory Visit: Payer: BLUE CROSS/BLUE SHIELD

## 2015-02-11 ENCOUNTER — Other Ambulatory Visit: Payer: Self-pay | Admitting: Internal Medicine

## 2015-02-27 ENCOUNTER — Other Ambulatory Visit: Payer: Self-pay | Admitting: *Deleted

## 2015-02-27 MED ORDER — INSULIN DETEMIR 100 UNIT/ML FLEXPEN: PEN_INJECTOR | SUBCUTANEOUS | Status: AC

## 2015-03-18 ENCOUNTER — Ambulatory Visit: Payer: BLUE CROSS/BLUE SHIELD | Admitting: Family

## 2015-03-26 ENCOUNTER — Encounter: Payer: Self-pay | Admitting: Family Medicine

## 2015-03-26 ENCOUNTER — Ambulatory Visit (INDEPENDENT_AMBULATORY_CARE_PROVIDER_SITE_OTHER): Payer: BLUE CROSS/BLUE SHIELD | Admitting: Family Medicine

## 2015-03-26 VITALS — BP 122/60 | HR 74 | Temp 98.2°F | Wt 278.0 lb

## 2015-03-26 DIAGNOSIS — D6851 Activated protein C resistance: Secondary | ICD-10-CM

## 2015-03-26 DIAGNOSIS — Z5181 Encounter for therapeutic drug level monitoring: Secondary | ICD-10-CM | POA: Diagnosis not present

## 2015-03-26 DIAGNOSIS — I2699 Other pulmonary embolism without acute cor pulmonale: Secondary | ICD-10-CM | POA: Diagnosis not present

## 2015-03-26 DIAGNOSIS — E1165 Type 2 diabetes mellitus with hyperglycemia: Secondary | ICD-10-CM

## 2015-03-26 DIAGNOSIS — I10 Essential (primary) hypertension: Secondary | ICD-10-CM

## 2015-03-26 LAB — POCT INR: INR: 2.5

## 2015-03-26 NOTE — Assessment & Plan Note (Signed)
S:Controlled on Chlorthalidone , metoprolol  XL, diovan . Cards started the diovan, had previously been d/ced from ARB by pulmonary.  A/P:balancing benefits/risks here. I will continue current rx. Pulm and cards can certainly communicate if future changes desired.

## 2015-03-26 NOTE — Assessment & Plan Note (Signed)
S:Poor control off home readings. 44 aspart TID, 60 levemir BID and glipizide-stopped glipizide. Metformin or invokana ? Am near 200. 10 am around 90-110, 250 after lunch forgets sometime-300s, before dinner 110-120 Lab Results  Component Value Date   HGBA1C 8.2* 01/14/2015  A/P: Titrate levemir to 62 units BID, continue aspart 44 TID. A1c in a month, likely if >7.5 a1c trial metformin 500mg  daily- states had myalgias in past but will retry at lower dose.

## 2015-03-26 NOTE — Assessment & Plan Note (Signed)
S: hx PE. Compliant with warfarin.  A/P: INR at goal today. Continue 2-3 goal given factor V leiden history. Continue warfarin and follow up INR check 1 month.

## 2015-03-26 NOTE — Progress Notes (Signed)
Tana Conch, MD  Subjective:  [name] is a 64 y.o. year old very pleasant male patient who presents with:  See problem oriented charting ROS- some shakiness and anxiety when CBG gets to 90, no blurry vision, no ches tpain or shortness of breath  Past Medical History- CAD, cervical arthrtiis, HLD, OSA  Medications- reviewed and updated Current Outpatient Prescriptions  Medication Sig Dispense Refill  . atorvastatin (LIPITOR) 40 MG tablet TAKE 1 TABLET (40 MG TOTAL) BY MOUTH DAILY. 90 tablet 2  . B-D ULTRAFINE III SHORT PEN 31G X 8 MM MISC USE AS DIRECTED TWICE A DAY 100 each 6  . chlorthalidone (HYGROTON) 25 MG tablet Take 25 mg by mouth every morning.    . citalopram (CELEXA) 20 MG tablet TAKE 1 TABLET BY MOUTH EVERY DAY 30 tablet 4  . clobetasol cream (TEMOVATE) 0.05 % Apply 1 application topically 2 (two) times daily as needed (for rash).    Marland Kitchen EFFIENT 5 MG TABS tablet TAKE 1 TABLET BY MOUTH DAILY 30 tablet 2  . fluticasone (FLONASE) 50 MCG/ACT nasal spray Place 2 sprays into both nostrils daily. 16 g 6  . furosemide (LASIX) 20 MG tablet Take 20 mg by mouth continuous as needed for fluid or edema (Take one tablet  by mouth as needed for fluid rentinion).    . gabapentin (NEURONTIN) 300 MG capsule Take 1 capsule (300 mg total) by mouth 3 (three) times daily. 90 capsule 11  . Insulin Detemir (LEVEMIR FLEXTOUCH) 100 UNIT/ML Pen INJECT 60 UNITS INTO THE SKIN 2 (TWO) TIMES DAILY. OR AS DIRECTED 20 pen 6  . metoprolol succinate (TOPROL-XL) 25 MG 24 hr tablet Take 1 tablet (25 mg total) by mouth daily. 30 tablet 5  . NOVOLOG 100 UNIT/ML injection INJECT 44 UNITS INTO THE SKIN 3 (THREE) TIMES DAILY BEFORE MEALS. 50 mL 9  . omeprazole (PRILOSEC) 20 MG capsule TAKE 1 CAPSULE (20 MG TOTAL) BY MOUTH DAILY. 90 capsule 2  . Potassium Citrate 15 MEQ (1620 MG) TBCR Take 1 tablet by mouth 3 (three) times daily.     . valsartan (DIOVAN) 40 MG tablet Take 1 tablet (40 mg total) by mouth daily. 90  tablet 3  . warfarin (COUMADIN) 5 MG tablet TAKE AS DIRECTED BY ANTICOAGULATION CLINIC 35 tablet 3  . nitroGLYCERIN (NITROSTAT) 0.4 MG SL tablet Place 0.4 mg under the tongue every 5 (five) minutes as needed for chest pain.    Marland Kitchen nystatin cream (MYCOSTATIN) Apply 1 application topically as needed (Patient stated this is PRN for yeast infection).      Objective: BP 122/60 mmHg  Pulse 74  Temp(Src) 98.2 F (36.8 C)  Wt 278 lb (126.1 kg) Gen: NAD, resting comfortably, neck turned to right per baseline CV: RRR no murmurs rubs or gallops Lungs: CTAB no crackles, wheeze, rhonchi Abdomen: soft/nontender/nondistended/normal bowel sounds. No rebound or guarding. Obese.  Ext: 1+ edema Skin: warm, dry, no rash  Neuro: grossly normal, moves all extremities   Assessment/Plan:  Poorly controlled diabetes mellitus S:Poor control off home readings. 44 aspart TID, 60 levemir BID and glipizide-stopped glipizide. Metformin or invokana ? Am near 200. 10 am around 90-110, 250 after lunch forgets sometime-300s, before dinner 110-120 Lab Results  Component Value Date   HGBA1C 8.2* 01/14/2015  A/P: Titrate levemir to 62 units BID, continue aspart 44 TID. A1c in a month, likely if >7.5 a1c trial metformin  daily- states had myalgias in past but will retry at lower dose.  Factor V Leiden S: hx PE. Compliant with warfarin.  A/P: INR at goal today. Continue 2-3 goal given factor V leiden history. Continue warfarin and follow up INR check 1 month.    Essential hypertension S:Controlled on Chlorthalidone 25mg , metoprolol 25mg  XL, diovan 40mg . Cards started the diovan, had previously been d/ced from ARB by pulmonary.  A/P:balancing benefits/risks here. I will continue current rx. Pulm and cards can certainly communicate if future changes desired.     Orders Placed This Encounter  Procedures  . Basic Metabolic Panel    Standing Status: Future     Number of Occurrences:      Standing Expiration  Date: 03/25/2016    Order Specific Question:  Has the patient fasted?    Answer:  Yes  . Hemoglobin A1c    Standing Status: Future     Number of Occurrences:      Standing Expiration Date: 03/25/2016  . POC INR

## 2015-03-26 NOTE — Patient Instructions (Addendum)
Go see Dr. Thurston HoleWainer about your knees. May be time to move forward with surgery  BP looks great.   Check INR today- [facility]   Increase Levimir to 62 units in evening and if no low blood sugars, increase to 62 units in AM  Come back in a month for a1c and kidney function test (your calcium was slightly high on previous test that I want to recheck). We may add metformin depending on a1c

## 2015-04-02 ENCOUNTER — Telehealth: Payer: Self-pay | Admitting: Internal Medicine

## 2015-04-02 ENCOUNTER — Telehealth: Payer: Self-pay | Admitting: Family Medicine

## 2015-04-02 NOTE — Telephone Encounter (Signed)
I spoke with pt and he is feeling better, he has had a couple of bowel movements and no complaint of chest pain. He will follow up with Dr. Durene CalHunter as needed, also he has appointment with Dr. Sherene SiresWert in pulmonary tomorrow 04/07/2015.

## 2015-04-02 NOTE — Telephone Encounter (Signed)
I tried to reach pt on home & cell number and no answer.

## 2015-04-02 NOTE — Telephone Encounter (Signed)
See below

## 2015-04-02 NOTE — Telephone Encounter (Signed)
[facility] - Client TELEPHONE ADVICE RECORD [facility] Patient Name: [name] DOB: 1950-10-07 Initial Comment Caller states her husband has had a headache the past few days along with pain in leg and arms. Nurse Assessment Nurse: Ladona RidgelGaddy, RN, Felicia Date/Time (Eastern Time): 04/02/2015 3:46:26 PM Confirm and document reason for call. If symptomatic, describe symptoms. ---Pt has had HA for a few d with pain in arms and legs. Chest was sore last night but not today. No trouble moving. Out of breath Has the patient traveled out of the country within the last 30 days? ---No Does the patient require triage? ---Yes Related visit to physician within the last 2 weeks? ---No Does the PT have any chronic conditions? (i.e. diabetes, asthma, etc.) ---Yes List chronic conditions. ---DM and factor 5, psoriatic arthritis, 5 stentsGuidelines Guideline Title Affirmed Question Affirmed Notes Chest Pain Sounds like a life-threatening emergency to the triager Final Disposition User Call EMS 911 Now WestmontGaddy, RN, Felicia Comments New onset of SOB today then pt got on the phone and admitted to a little chest pain today HA and leg pain in both legs of level 10  Call Id: [phone]

## 2015-04-02 NOTE — Telephone Encounter (Signed)
Spoke with pt and advised needs appt with MW with all meds in hand  ED sooner if symptoms persist or worsen Ov with MW at 9 am tomorrow

## 2015-04-02 NOTE — Telephone Encounter (Signed)
Spoke with pt, c/o headache since Monday, chills, joint and extremity pain, chest soreness, sob with exertion, 101.7 temp this afternoon.    Pt has taken acetaminophen to help with pain.  Pt called pcp, was instructed to go to ED.  Pt doesn't feel like he needs to be seen at hospital. Pt uses [facility] on [address].   MW please advise.  Thanks!

## 2015-04-03 ENCOUNTER — Ambulatory Visit (INDEPENDENT_AMBULATORY_CARE_PROVIDER_SITE_OTHER)
Admission: RE | Admit: 2015-04-03 | Discharge: 2015-04-03 | Disposition: A | Discharge status: Home / Self Care | Payer: BLUE CROSS/BLUE SHIELD | Source: Ambulatory Visit | Attending: Internal Medicine | Admitting: Internal Medicine

## 2015-04-03 ENCOUNTER — Other Ambulatory Visit (INDEPENDENT_AMBULATORY_CARE_PROVIDER_SITE_OTHER): Payer: BLUE CROSS/BLUE SHIELD

## 2015-04-03 ENCOUNTER — Encounter: Payer: Self-pay | Admitting: Internal Medicine

## 2015-04-03 ENCOUNTER — Telehealth: Payer: Self-pay | Admitting: Internal Medicine

## 2015-04-03 ENCOUNTER — Inpatient Hospital Stay (HOSPITAL_COMMUNITY)
Admission: EM | Admit: 2015-04-03 | Discharge: 2015-04-27 | DRG: 870 | Disposition: E | Payer: BLUE CROSS/BLUE SHIELD | Attending: Pulmonary Disease | Admitting: Pulmonary Disease

## 2015-04-03 ENCOUNTER — Encounter (HOSPITAL_COMMUNITY): Payer: Self-pay | Admitting: *Deleted

## 2015-04-03 ENCOUNTER — Ambulatory Visit (INDEPENDENT_AMBULATORY_CARE_PROVIDER_SITE_OTHER): Payer: BLUE CROSS/BLUE SHIELD | Admitting: Internal Medicine

## 2015-04-03 VITALS — BP 132/60 | HR 116 | Temp 100.1°F | Ht 71.0 in | Wt 271.4 lb

## 2015-04-03 DIAGNOSIS — N170 Acute kidney failure with tubular necrosis: Secondary | ICD-10-CM | POA: Diagnosis present

## 2015-04-03 DIAGNOSIS — R4182 Altered mental status, unspecified: Secondary | ICD-10-CM | POA: Diagnosis not present

## 2015-04-03 DIAGNOSIS — G4733 Obstructive sleep apnea (adult) (pediatric): Secondary | ICD-10-CM | POA: Diagnosis present

## 2015-04-03 DIAGNOSIS — Z86711 Personal history of pulmonary embolism: Secondary | ICD-10-CM | POA: Diagnosis not present

## 2015-04-03 DIAGNOSIS — Z888 Allergy status to other drugs, medicaments and biological substances status: Secondary | ICD-10-CM | POA: Diagnosis not present

## 2015-04-03 DIAGNOSIS — Z7901 Long term (current) use of anticoagulants: Secondary | ICD-10-CM | POA: Diagnosis not present

## 2015-04-03 DIAGNOSIS — Z6838 Body mass index (BMI) 38.0-38.9, adult: Secondary | ICD-10-CM

## 2015-04-03 DIAGNOSIS — Z66 Do not resuscitate: Secondary | ICD-10-CM | POA: Diagnosis present

## 2015-04-03 DIAGNOSIS — D6851 Activated protein C resistance: Secondary | ICD-10-CM | POA: Diagnosis present

## 2015-04-03 DIAGNOSIS — E785 Hyperlipidemia, unspecified: Secondary | ICD-10-CM | POA: Diagnosis present

## 2015-04-03 DIAGNOSIS — E1142 Type 2 diabetes mellitus with diabetic polyneuropathy: Secondary | ICD-10-CM | POA: Diagnosis present

## 2015-04-03 DIAGNOSIS — R197 Diarrhea, unspecified: Secondary | ICD-10-CM | POA: Diagnosis not present

## 2015-04-03 DIAGNOSIS — Z789 Other specified health status: Secondary | ICD-10-CM | POA: Diagnosis not present

## 2015-04-03 DIAGNOSIS — R509 Fever, unspecified: Secondary | ICD-10-CM | POA: Diagnosis present

## 2015-04-03 DIAGNOSIS — I2699 Other pulmonary embolism without acute cor pulmonale: Secondary | ICD-10-CM | POA: Diagnosis present

## 2015-04-03 DIAGNOSIS — L409 Psoriasis, unspecified: Secondary | ICD-10-CM | POA: Diagnosis present

## 2015-04-03 DIAGNOSIS — J9600 Acute respiratory failure, unspecified whether with hypoxia or hypercapnia: Secondary | ICD-10-CM | POA: Diagnosis not present

## 2015-04-03 DIAGNOSIS — R6520 Severe sepsis without septic shock: Secondary | ICD-10-CM | POA: Diagnosis present

## 2015-04-03 DIAGNOSIS — I2510 Atherosclerotic heart disease of native coronary artery without angina pectoris: Secondary | ICD-10-CM | POA: Diagnosis present

## 2015-04-03 DIAGNOSIS — R001 Bradycardia, unspecified: Secondary | ICD-10-CM | POA: Diagnosis not present

## 2015-04-03 DIAGNOSIS — E874 Mixed disorder of acid-base balance: Secondary | ICD-10-CM | POA: Diagnosis present

## 2015-04-03 DIAGNOSIS — G931 Anoxic brain damage, not elsewhere classified: Secondary | ICD-10-CM | POA: Diagnosis not present

## 2015-04-03 DIAGNOSIS — Z794 Long term (current) use of insulin: Secondary | ICD-10-CM

## 2015-04-03 DIAGNOSIS — F329 Major depressive disorder, single episode, unspecified: Secondary | ICD-10-CM | POA: Diagnosis present

## 2015-04-03 DIAGNOSIS — Z8249 Family history of ischemic heart disease and other diseases of the circulatory system: Secondary | ICD-10-CM

## 2015-04-03 DIAGNOSIS — Z955 Presence of coronary angioplasty implant and graft: Secondary | ICD-10-CM

## 2015-04-03 DIAGNOSIS — K219 Gastro-esophageal reflux disease without esophagitis: Secondary | ICD-10-CM | POA: Diagnosis present

## 2015-04-03 DIAGNOSIS — I469 Cardiac arrest, cause unspecified: Secondary | ICD-10-CM | POA: Diagnosis not present

## 2015-04-03 DIAGNOSIS — J310 Chronic rhinitis: Secondary | ICD-10-CM | POA: Diagnosis present

## 2015-04-03 DIAGNOSIS — A481 Legionnaires' disease: Secondary | ICD-10-CM | POA: Insufficient documentation

## 2015-04-03 DIAGNOSIS — S0990XA Unspecified injury of head, initial encounter: Secondary | ICD-10-CM | POA: Diagnosis present

## 2015-04-03 DIAGNOSIS — F32A Depression, unspecified: Secondary | ICD-10-CM | POA: Diagnosis present

## 2015-04-03 DIAGNOSIS — S1093XA Contusion of unspecified part of neck, initial encounter: Secondary | ICD-10-CM | POA: Diagnosis present

## 2015-04-03 DIAGNOSIS — Z79899 Other long term (current) drug therapy: Secondary | ICD-10-CM | POA: Diagnosis not present

## 2015-04-03 DIAGNOSIS — E870 Hyperosmolality and hypernatremia: Secondary | ICD-10-CM | POA: Diagnosis present

## 2015-04-03 DIAGNOSIS — J9690 Respiratory failure, unspecified, unspecified whether with hypoxia or hypercapnia: Secondary | ICD-10-CM

## 2015-04-03 DIAGNOSIS — A419 Sepsis, unspecified organism: Secondary | ICD-10-CM | POA: Diagnosis present

## 2015-04-03 DIAGNOSIS — D72819 Decreased white blood cell count, unspecified: Secondary | ICD-10-CM | POA: Diagnosis not present

## 2015-04-03 DIAGNOSIS — I10 Essential (primary) hypertension: Secondary | ICD-10-CM | POA: Diagnosis present

## 2015-04-03 DIAGNOSIS — J189 Pneumonia, unspecified organism: Secondary | ICD-10-CM

## 2015-04-03 DIAGNOSIS — R5081 Fever presenting with conditions classified elsewhere: Secondary | ICD-10-CM | POA: Diagnosis not present

## 2015-04-03 DIAGNOSIS — R092 Respiratory arrest: Secondary | ICD-10-CM | POA: Diagnosis not present

## 2015-04-03 DIAGNOSIS — G9340 Encephalopathy, unspecified: Secondary | ICD-10-CM | POA: Diagnosis not present

## 2015-04-03 DIAGNOSIS — Z9989 Dependence on other enabling machines and devices: Secondary | ICD-10-CM

## 2015-04-03 DIAGNOSIS — W19XXXA Unspecified fall, initial encounter: Secondary | ICD-10-CM

## 2015-04-03 DIAGNOSIS — D708 Other neutropenia: Secondary | ICD-10-CM | POA: Diagnosis present

## 2015-04-03 DIAGNOSIS — E871 Hypo-osmolality and hyponatremia: Secondary | ICD-10-CM | POA: Diagnosis not present

## 2015-04-03 DIAGNOSIS — Z515 Encounter for palliative care: Secondary | ICD-10-CM | POA: Diagnosis not present

## 2015-04-03 DIAGNOSIS — W06XXXA Fall from bed, initial encounter: Secondary | ICD-10-CM | POA: Diagnosis present

## 2015-04-03 DIAGNOSIS — E232 Diabetes insipidus: Secondary | ICD-10-CM | POA: Diagnosis not present

## 2015-04-03 DIAGNOSIS — E1165 Type 2 diabetes mellitus with hyperglycemia: Secondary | ICD-10-CM | POA: Diagnosis not present

## 2015-04-03 DIAGNOSIS — J9601 Acute respiratory failure with hypoxia: Secondary | ICD-10-CM | POA: Diagnosis present

## 2015-04-03 DIAGNOSIS — E876 Hypokalemia: Secondary | ICD-10-CM | POA: Diagnosis not present

## 2015-04-03 DIAGNOSIS — Z978 Presence of other specified devices: Secondary | ICD-10-CM

## 2015-04-03 HISTORY — DX: Pneumonia, unspecified organism: J18.9

## 2015-04-03 LAB — COMPREHENSIVE METABOLIC PANEL
ALT: 23 U/L (ref 17–63)
ANION GAP: 8 (ref 5–15)
AST: 25 U/L (ref 15–41)
Albumin: 3.3 g/dL — ABNORMAL LOW (ref 3.5–5.0)
Alkaline Phosphatase: 118 U/L (ref 38–126)
BILIRUBIN TOTAL: 1 mg/dL (ref 0.3–1.2)
BUN: 19 mg/dL (ref 6–20)
CHLORIDE: 103 mmol/L (ref 101–111)
CO2: 24 mmol/L (ref 22–32)
Calcium: 9.6 mg/dL (ref 8.9–10.3)
Creatinine, Ser: 1.23 mg/dL (ref 0.61–1.24)
GFR calc Af Amer: >60 mL/min (ref >60)
GFR calc non Af Amer: >60 mL/min (ref >60)
Glucose, Bld: 216 mg/dL — ABNORMAL HIGH (ref 65–99)
Potassium: 4 mmol/L (ref 3.5–5.1)
SODIUM: 135 mmol/L (ref 135–145)
TOTAL PROTEIN: 7.3 g/dL (ref 6.5–8.1)

## 2015-04-03 LAB — BASIC METABOLIC PANEL
BUN: 19 mg/dL (ref 6–23)
CALCIUM: 10.1 mg/dL (ref 8.4–10.5)
CO2: 24 meq/L (ref 19–32)
CREATININE: 1.15 mg/dL (ref 0.40–1.50)
Chloride: 101 mEq/L (ref 96–112)
GFR: 68.51 mL/min (ref >60.00)
GLUCOSE: 256 mg/dL — AB (ref 70–99)
Potassium: 3.8 mEq/L (ref 3.5–5.1)
SODIUM: 136 meq/L (ref 135–145)

## 2015-04-03 LAB — I-STAT CG4 LACTIC ACID, ED
Lactic Acid, Venous: 2.19 mmol/L (ref 0.5–2.0)
Lactic Acid, Venous: 0.94 mmol/L (ref 0.5–2.0)

## 2015-04-03 LAB — CBC WITH DIFFERENTIAL/PLATELET
BASOS ABS: 0 10*3/uL (ref 0.0–0.1)
Basophils Relative: 0.9 % (ref 0.0–3.0)
EOS PCT: 0.5 % (ref 0.0–5.0)
Eosinophils Absolute: 0 10*3/uL (ref 0.0–0.7)
HCT: 35.5 % — ABNORMAL LOW (ref 39.0–52.0)
HEMOGLOBIN: 12.1 g/dL — AB (ref 13.0–17.0)
LYMPHS PCT: 31.7 % (ref 12.0–46.0)
Lymphs Abs: 0.4 10*3/uL — ABNORMAL LOW (ref 0.7–4.0)
MCHC: 34 g/dL (ref 30.0–36.0)
MCV: 82 fl (ref 78.0–100.0)
MONOS PCT: 1.2 % — AB (ref 3.0–12.0)
Monocytes Absolute: 0 10*3/uL — ABNORMAL LOW (ref 0.1–1.0)
Neutro Abs: 0.8 10*3/uL — ABNORMAL LOW (ref 1.4–7.7)
Neutrophils Relative %: 65.7 % (ref 43.0–77.0)
PLATELETS: 213 10*3/uL (ref 150.0–400.0)
RBC: 4.33 Mil/uL (ref 4.22–5.81)
RDW: 15.6 % — AB (ref 11.5–15.5)

## 2015-04-03 LAB — CBC
HCT: 36.3 % — ABNORMAL LOW (ref 39.0–52.0)
Hemoglobin: 12.3 g/dL — ABNORMAL LOW (ref 13.0–17.0)
MCH: 28.1 pg (ref 26.0–34.0)
MCHC: 33.9 g/dL (ref 30.0–36.0)
MCV: 83.1 fL (ref 78.0–100.0)
Platelets: 176 10*3/uL (ref 150–400)
RBC: 4.37 MIL/uL (ref 4.22–5.81)
RDW: 15.2 % (ref 11.5–15.5)
WBC: 1.1 10*3/uL — CL (ref 4.0–10.5)

## 2015-04-03 LAB — STREP PNEUMONIAE URINARY ANTIGEN: STREP PNEUMO URINARY ANTIGEN: NEGATIVE

## 2015-04-03 LAB — GLUCOSE, CAPILLARY
GLUCOSE-CAPILLARY: 235 mg/dL — AB (ref 65–99)
Glucose-Capillary: 199 mg/dL — ABNORMAL HIGH (ref 65–99)

## 2015-04-03 LAB — PROTIME-INR
INR: 1.51 — AB (ref 0.00–1.49)
Prothrombin Time: 18.3 seconds — ABNORMAL HIGH (ref 11.6–15.2)

## 2015-04-03 LAB — INFLUENZA PANEL BY PCR (TYPE A & B)
H1N1 flu by pcr: NOT DETECTED
INFLBPCR: NEGATIVE
Influenza A By PCR: NEGATIVE

## 2015-04-03 LAB — CBG MONITORING, ED: Glucose-Capillary: 218 mg/dL — ABNORMAL HIGH (ref 65–99)

## 2015-04-03 LAB — LACTIC ACID, PLASMA: Lactic Acid, Venous: 1.8 mmol/L (ref 0.5–2.0)

## 2015-04-03 MED ORDER — SODIUM CHLORIDE 0.9 % IV BOLUS (SEPSIS)
1000.0000 mL | Freq: Once | INTRAVENOUS | Status: AC
  Administered 2015-04-03: 1000 mL via INTRAVENOUS

## 2015-04-03 MED ORDER — WARFARIN - PHARMACIST DOSING INPATIENT
Freq: Every day | Status: DC
  Administered 2015-04-03: 18:00:00

## 2015-04-03 MED ORDER — GABAPENTIN 300 MG PO CAPS
300.0000 mg | ORAL_CAPSULE | Freq: Three times a day (TID) | ORAL | Status: DC
  Administered 2015-04-03 (×2): 300 mg via ORAL
  Filled 2015-04-03 (×5): qty 1

## 2015-04-03 MED ORDER — PRASUGREL HCL 5 MG PO TABS
5.0000 mg | ORAL_TABLET | Freq: Every day | ORAL | Status: DC
  Administered 2015-04-04 – 2015-04-14 (×11): 5 mg via ORAL
  Filled 2015-04-03 (×12): qty 1

## 2015-04-03 MED ORDER — INSULIN ASPART 100 UNIT/ML ~~LOC~~ SOLN
0.0000 [IU] | Freq: Three times a day (TID) | SUBCUTANEOUS | Status: DC
  Administered 2015-04-03: 7 [IU] via SUBCUTANEOUS

## 2015-04-03 MED ORDER — INSULIN DETEMIR 100 UNIT/ML ~~LOC~~ SOLN
50.0000 [IU] | Freq: Two times a day (BID) | SUBCUTANEOUS | Status: DC
  Administered 2015-04-03: 50 [IU] via SUBCUTANEOUS
  Filled 2015-04-03 (×3): qty 0.5

## 2015-04-03 MED ORDER — DEXTROSE 5 % IV SOLN
1.0000 g | INTRAVENOUS | Status: DC
  Filled 2015-04-03: qty 10

## 2015-04-03 MED ORDER — IRBESARTAN 75 MG PO TABS
37.5000 mg | ORAL_TABLET | Freq: Every day | ORAL | Status: DC
  Filled 2015-04-03: qty 0.5

## 2015-04-03 MED ORDER — ACETAMINOPHEN 325 MG PO TABS
650.0000 mg | ORAL_TABLET | Freq: Once | ORAL | Status: AC
  Administered 2015-04-03: 650 mg via ORAL
  Filled 2015-04-03: qty 2

## 2015-04-03 MED ORDER — INSULIN ASPART 100 UNIT/ML ~~LOC~~ SOLN
6.0000 [IU] | Freq: Three times a day (TID) | SUBCUTANEOUS | Status: DC
  Administered 2015-04-03: 6 [IU] via SUBCUTANEOUS

## 2015-04-03 MED ORDER — LEVOFLOXACIN IN D5W 750 MG/150ML IV SOLN
750.0000 mg | Freq: Once | INTRAVENOUS | Status: AC
  Administered 2015-04-03: 750 mg via INTRAVENOUS
  Filled 2015-04-03: qty 150

## 2015-04-03 MED ORDER — INSULIN ASPART 100 UNIT/ML ~~LOC~~ SOLN: 0.0000 [IU] | Freq: Every day | SUBCUTANEOUS | Status: DC

## 2015-04-03 MED ORDER — LORAZEPAM 0.5 MG PO TABS
0.5000 mg | ORAL_TABLET | Freq: Once | ORAL | Status: AC
  Administered 2015-04-03: 0.5 mg via ORAL
  Filled 2015-04-03: qty 1

## 2015-04-03 MED ORDER — SODIUM CHLORIDE 0.9 % IV BOLUS (SEPSIS)
500.0000 mL | Freq: Once | INTRAVENOUS | Status: AC
  Administered 2015-04-03: 500 mL via INTRAVENOUS

## 2015-04-03 MED ORDER — WARFARIN SODIUM 5 MG PO TABS
5.0000 mg | ORAL_TABLET | Freq: Once | ORAL | Status: AC
  Filled 2015-04-03: qty 1

## 2015-04-03 MED ORDER — ACETAMINOPHEN 325 MG PO TABS: 650.0000 mg | ORAL_TABLET | Freq: Four times a day (QID) | ORAL | Status: DC | PRN

## 2015-04-03 MED ORDER — CITALOPRAM HYDROBROMIDE 20 MG PO TABS
20.0000 mg | ORAL_TABLET | Freq: Every day | ORAL | Status: DC
  Filled 2015-04-03: qty 1

## 2015-04-03 MED ORDER — ADULT MULTIVITAMIN W/MINERALS CH
1.0000 | ORAL_TABLET | Freq: Every day | ORAL | Status: DC
  Filled 2015-04-03: qty 1

## 2015-04-03 MED ORDER — METOPROLOL SUCCINATE ER 25 MG PO TB24
25.0000 mg | ORAL_TABLET | Freq: Every day | ORAL | Status: DC
  Filled 2015-04-03: qty 1

## 2015-04-03 MED ORDER — CHLORTHALIDONE 25 MG PO TABS
25.0000 mg | ORAL_TABLET | Freq: Every morning | ORAL | Status: DC
  Filled 2015-04-03: qty 1

## 2015-04-03 MED ORDER — SODIUM CHLORIDE 0.9 % IV SOLN
INTRAVENOUS | Status: DC
  Administered 2015-04-03: 16:00:00 via INTRAVENOUS

## 2015-04-03 MED ORDER — LORATADINE 10 MG PO TABS
10.0000 mg | ORAL_TABLET | Freq: Every day | ORAL | Status: DC
  Administered 2015-04-03: 10 mg via ORAL
  Filled 2015-04-03 (×2): qty 1

## 2015-04-03 MED ORDER — LEVALBUTEROL HCL 0.63 MG/3ML IN NEBU
0.6300 mg | INHALATION_SOLUTION | Freq: Four times a day (QID) | RESPIRATORY_TRACT | Status: DC | PRN
  Administered 2015-04-03: 0.63 mg via RESPIRATORY_TRACT
  Filled 2015-04-03: qty 3

## 2015-04-03 MED ORDER — ATORVASTATIN CALCIUM 40 MG PO TABS
40.0000 mg | ORAL_TABLET | Freq: Every day | ORAL | Status: DC
  Administered 2015-04-04: 40 mg via ORAL
  Filled 2015-04-03 (×2): qty 1

## 2015-04-03 MED ORDER — DEXTROSE 5 % IV SOLN: 500.0000 mg | INTRAVENOUS | Status: DC

## 2015-04-03 MED ORDER — FLUTICASONE PROPIONATE 50 MCG/ACT NA SUSP
2.0000 | Freq: Every day | NASAL | Status: DC
  Administered 2015-04-03: 2 via NASAL
  Filled 2015-04-03: qty 16

## 2015-04-03 MED ORDER — AZITHROMYCIN 500 MG IV SOLR
500.0000 mg | INTRAVENOUS | Status: DC
  Filled 2015-04-03: qty 500

## 2015-04-03 MED ORDER — DEXTROSE 5 % IV SOLN: 1.0000 g | INTRAVENOUS | Status: DC

## 2015-04-03 MED ORDER — DOXYCYCLINE HYCLATE 100 MG PO TABS: 100.0000 mg | ORAL_TABLET | Freq: Two times a day (BID) | ORAL | Status: AC

## 2015-04-03 MED ORDER — ACETAMINOPHEN 325 MG PO TABS
650.0000 mg | ORAL_TABLET | Freq: Four times a day (QID) | ORAL | Status: DC | PRN
  Administered 2015-04-03: 650 mg via ORAL
  Filled 2015-04-03: qty 2

## 2015-04-03 MED ORDER — PANTOPRAZOLE SODIUM 40 MG PO TBEC: 40.0000 mg | DELAYED_RELEASE_TABLET | Freq: Every day | ORAL | Status: DC

## 2015-04-03 NOTE — Progress Notes (Signed)
Subjective:    Patient ID: [name], male    DOB: 22-Jun-1951,   MRN: [id number]    Brief patient profile:  79 yowm never smoker with h/o lifelong perennial rhinitis allergies to dust, no response to shots per [name] but never asthma then dx FV leiden Mutation on lifelong coumadin p PE around 2010 fine until onset of ha/nasal discharge/sorethroat cough late October 2015 never really better desptie multiple abx then onset sob mid Dec 2015 > w/u by cardiology neg > severe incessant cough early Jan 2016 > UC > steroids/ abx > some better then diarrhea > dx with abx assoc diarrhea so keflex stopped and referred to pulmonary clinic 10/14/2014    History of Present Illness  10/14/2014 1st [name]   Chief Complaint  Patient presents with  . Pulmonary Consult    Self referral. Pt c/o cough and SOB for the past 2 months. He states that he was dxed with bronchitis and sinus infection recently. Cough is prod with moderate white sputum. He also c/o hoarseness. He states "always" have had tendency to be SOB with exertion.   mucus presently white/ hurts in midline anteriorly just when  cough and overall a little better but still persistent coughing 24/7, unable to lie flat, sitting up in recliner and tol usual cpap poorly tolerating. rec schedule a sinus ct asap> neg  Pepcid ac 20 mg after bfast and at bedtime  Stop fish oil, prilosec, zyrtrec  For drainage take chlortrimeton (chlorpheniramine) 4 mg every 4 hours available over the counter (may cause drowsiness)  Take delsym two tsp every 12 hours and supplement if needed with  tramadol 50 mg up to 2 every 4 hours to suppress the urge to cough.  Once you have eliminated the cough for 3 straight days try reducing the tramadol first,  then the delsym as tolerated.   Stop losatan and increase lopressor to  Two every 12 hours  Diet:  Avoid dairy products except yogurt, undercooked veggies and all salads, eat  more broth based soups, noodles/ crackers  GERD  Diet    10/21/2014 f/u ov/[name] re: dyspnea better/ persistent cough/ throat irritation since oct 2015  Chief Complaint  Patient presents with  . Follow-up    Pt states that his SOB and cough are much improved. No new co's today.   Diarrhea gone, able to lie flat for sinus ct but hasn't been able to lie flat even on cpap x 10 years due to obesity  No longer needing tramadol but has freq  urge to clear throat day >> noct  rec Increase Neurontin 300 mg three times daily  Change lopressor to 50 mg twice daily  Take delsym two tsp every 12 hours and supplement if needed with  tramadol 50 mg up to 2 every 4 hours  Please schedule a follow up office visit in 6 weeks, call sooner if needed > did not return     04/20/2015 acute ov/[name] re: fever  Chief Complaint  Patient presents with  . Acute Visit    Pt c/o increased SOB, HA and fever for the past 3 days. He states he feels "uneasy and unsteady". Cough and rhinitis started last night- clear sputum.  04/01/15 acute chills then aching all over / then dry cough  Some nausea no vomiting or abd pain    No obvious day to day or daytime variabilty or assoc   cp or chest tightness, subjective  wheeze overt sinus or hb symptoms. No unusual exp hx or h/o childhood pna/ asthma or knowledge of premature birth.  Sleeping ok without nocturnal  or early am exacerbation  of respiratory  c/o's or need for noct saba. Also denies any obvious fluctuation of symptoms with weather or environmental changes or other aggravating or alleviating factors except as outlined above   Current Medications, Allergies, Complete Past Medical History, Past Surgical History, Family History, and Social History were reviewed in [facility] electronic medical record.  ROS  The following are not active complaints unless bolded sore throat, dysphagia, dental problems, itching, sneezing,  nasal congestion or excess/ purulent secretions,  ear ache,   fever, chills, sweats, unintended wt loss, pleuritic or exertional cp, hemoptysis,  orthopnea pnd or leg swelling, presyncope, palpitations, heartburn, abdominal pain, anorexia, nausea, vomiting, diarrhea  or change in bowel or urinary habits, change in stools or urine, dysuria,hematuria,  rash, arthralgias, visual complaints, headache, numbness weakness or ataxia or problems with walking or coordination,  change in mood/affect or memory.           Objective:   Physical Exam  amb wm nad  10/21/2014        272 >   04/13/2015  278  Wt Readings from Last 3 Encounters:  10/14/14 272 lb (123.378 kg)  10/06/14 274 lb (124.286 kg)  09/08/14 276 lb 9.6 oz (125.465 kg)    Vital signs reviewed  HEENT: nl dentition, turbinates, and orophanx. Nl external ear canals without cough reflex   NECK :  without JVD/Nodes/TM/ nl carotid upstrokes bilaterally   LUNGS: no acc muscle use, clear to A and P bilaterally without cough on insp or exp maneuvers   CV:  RRR  no s3 or murmur or increase in P2, no edema   ABD:  soft and nontender with nl excursion in the supine position. No bruits or organomegaly, bowel sounds nl  MS:  warm without deformities, calf tenderness, cyanosis or clubbing  SKIN: warm and dry without lesions    NEURO:  alert, approp, no deficits/ neg meningismus     Labs ordered last ov/ images reviewed include:   cxr  04/26/2015  Left lower lobe pneumonia.       Labs ordered/ reviewed  Lab Results  Component Value Date   WBC 1.3 Repeated and verified X2.* 03/31/2015   HGB 12.1* 04/01/2015   HCT 35.5* 04/05/2015   MCV 82.0 04/23/2015   PLT 213.0 04/21/2015          Assessment & Plan:

## 2015-04-03 NOTE — Patient Instructions (Addendum)
Please remember to go to the lab and x-ray department downstairs for your tests - we will call you with the results when they are available.    Doxycycline 100 mg twice daily before meals with a glass of water x 10 days  - let the coumadin clinic know you are on antibiotics  Tylenol or advil for pain / fever  - lots of soups/noodles/crackers  Prilosec 20 mg Take 30-60 min before first meal of the day and pepcid 20 mg at bedtime   GERD (REFLUX)  is an extremely common cause of respiratory symptoms just like yours , many times with no obvious heartburn at all.    It can be treated with medication, but also with lifestyle changes including elevation of the head of your bed (ideally with 6 inch  bed blocks),  Smoking cessation, avoidance of late meals, excessive alcohol, and avoid fatty foods, chocolate, peppermint, colas, red wine, and acidic juices such as orange juice.  NO MINT OR MENTHOL PRODUCTS SO NO COUGH DROPS  USE SUGARLESS CANDY INSTEAD (Jolley ranchers or Stover's or Life Savers) or even ice chips will also do - the key is to swallow to prevent all throat clearing. NO OIL BASED VITAMINS - use powdered substitutes.  If condition worsens over the weekend you will need to go to urgent care or the ER

## 2015-04-03 NOTE — Assessment & Plan Note (Signed)
No unusal exp/ tick bites but really not a typical hx for pna and no classic consolidation heard on auscultation thought difficult exam   rec  W/u with cxr/ cbc > empirical doxy pending studies

## 2015-04-03 NOTE — Assessment & Plan Note (Addendum)
Discussed with pt by phone, concern is for early sepsis vs neutropenia ? BM related with sepsis secondary to neutropenia (note lack of L shift on wbc diff)

## 2015-04-03 NOTE — ED Provider Notes (Signed)
CSN: [id number]     Arrival date & time 04/18/15  1301 History   First MD Initiated Contact with Patient 2015-04-18 1315     Chief Complaint  Patient presents with  . Pneumonia  . Cough     (Consider location/radiation/quality/duration/timing/severity/associated sxs/prior Treatment) Patient is a 63 y.o. male presenting with pneumonia and cough. The history is provided by the patient (pt complains of cough and fever.  saw his md today and he had a pneumnia).  Pneumonia This is a new problem. The current episode started more than 2 days ago. The problem occurs constantly. The problem has not changed since onset.Pertinent negatives include no chest pain, no abdominal pain and no headaches. Nothing aggravates the symptoms. Nothing relieves the symptoms.  Cough Associated symptoms: no chest pain, no eye discharge, no headaches and no rash     Past Medical History  Diagnosis Date  . Hyperlipidemia   . SLEEP APNEA, OBSTRUCTIVE 01/16/2009  . ESOPHAGEAL STRICTURE 01/16/2009  . PSORIASIS     uses a cream  . PULMONARY EMBOLISM     takes Coumadin daily  . NEPHROLITHIASIS, HX OF 02/27/2009  . ANEMIA-UNSPECIFIED 04/28/2010  . CAD (coronary artery disease)     a. s/p DES to RCA x2 in 2005. b. s/p DES to CFX 2009. c. s/p DES PTCA of RCA for in stent restenosis 03/2010. d. s/p Promus DES x2 to RCA 07/16/10(recurrent pain with abnormal Myoview 06/2010; residual nonobstructive disease at cath.  . Leukopenia 11/11/2011  . DEPRESSION   . HYPERTENSION   . GERD   . PONV (postoperative nausea and vomiting)   . Peripheral edema   . Peripheral neuropathy   . Arthritis   . Joint pain   . Seasonal allergies   . History of colon polyps   . History of kidney stones   . DIABETES MELLITUS-TYPE II 09/01/2010  . Plavix resistance   . Heterozygous factor V Leiden mutation   . Obesity    Past Surgical History  Procedure Laterality Date  . Knee arthroscopy  2007    left knee x 2  . Angioplasty  2005 & 2011  .  Esophagogastroduodenoscopy    . Colonoscopy    . Carpal tunnel release Bilateral   . Coronary angioplasty with stent placement  2005/2009    5 stents   . Cystoscopy      x 2 due to kidney stones   Family History  Problem Relation Age of Onset  . Pulmonary embolism Mother   . Factor V Leiden deficiency Mother   . Diabetes Brother   . Heart failure Brother   . Hypertension Brother   . Asthma Mother   . Factor V Leiden deficiency Brother    History  Substance Use Topics  . Smoking status: Never Smoker   . Smokeless tobacco: Never Used  . Alcohol Use: No    Review of Systems  Constitutional: Negative for appetite change and fatigue.  HENT: Negative for congestion, ear discharge and sinus pressure.   Eyes: Negative for discharge.  Respiratory: Positive for cough.   Cardiovascular: Negative for chest pain.  Gastrointestinal: Negative for abdominal pain and diarrhea.  Genitourinary: Negative for frequency and hematuria.  Musculoskeletal: Negative for back pain.  Skin: Negative for rash.  Neurological: Negative for seizures and headaches.  Psychiatric/Behavioral: Negative for hallucinations.      Allergies  Piroxicam and Rofecoxib  Home Medications   Prior to Admission medications   Medication Sig Start Date End Date Taking? Authorizing  Provider  atorvastatin (LIPITOR) 40 MG tablet TAKE 1 TABLET (40 MG TOTAL) BY MOUTH DAILY. 01/22/15   Shelva MajesticStephen O Hunter, MD  B-D ULTRAFINE III SHORT PEN 31G X 8 MM MISC USE AS DIRECTED TWICE A DAY 01/06/15   Shelva MajesticStephen O Hunter, MD  cetirizine (ZYRTEC) 10 MG tablet Take 10 mg by mouth daily.    Historical Provider, MD  chlorthalidone (HYGROTON) 25 MG tablet Take 25 mg by mouth every morning.    Historical Provider, MD  citalopram (CELEXA) 20 MG tablet TAKE 1 TABLET BY MOUTH EVERY DAY 02/11/15   Shelva MajesticStephen O Hunter, MD  clobetasol cream (TEMOVATE) 0.05 % Apply 1 application topically 2 (two) times daily as needed (for rash).    Historical Provider, MD   doxycycline (VIBRA-TABS) 100 MG tablet Take 1 tablet (100 mg total) by mouth 2 (two) times daily. 04/20/2015   Nyoka CowdenMichael B Wert, MD  EFFIENT 5 MG TABS tablet TAKE 1 TABLET BY MOUTH DAILY 01/20/15   Tonny BollmanMichael Cooper, MD  fluticasone [facility](FLONASE) 50 MCG/ACT nasal spray Place 2 sprays into both nostrils daily. 09/03/13   Eulis FosterPadonda B Webb, FNP  furosemide (LASIX) 20 MG tablet Take 20 mg by mouth continuous as needed for fluid or edema (Take one tablet  by mouth as needed for fluid rentinion).    Historical Provider, MD  gabapentin (NEURONTIN) 300 MG capsule Take 1 capsule (300 mg total) by mouth 3 (three) times daily. 10/21/14   Nyoka CowdenMichael B Wert, MD  Insulin Detemir (LEVEMIR FLEXTOUCH) 100 UNIT/ML Pen INJECT 60 UNITS INTO THE SKIN 2 (TWO) TIMES DAILY. OR AS DIRECTED Patient taking differently: INJECT 65 UNITS INTO THE SKIN 2 (TWO) TIMES DAILY. OR AS DIRECTED 02/27/15   Shelva MajesticStephen O Hunter, MD  metoprolol succinate (TOPROL-XL) 25 MG 24 hr tablet Take 1 tablet (25 mg total) by mouth daily. 01/14/15   Shelva MajesticStephen O Hunter, MD  Multiple Vitamin (MULTIVITAMIN) capsule Take 1 capsule by mouth daily.    Historical Provider, MD  nitroGLYCERIN (NITROSTAT) 0.4 MG SL tablet Place 0.4 mg under the tongue every 5 (five) minutes as needed for chest pain.    Historical Provider, MD  NOVOLOG 100 UNIT/ML injection INJECT 44 UNITS INTO THE SKIN 3 (THREE) TIMES DAILY BEFORE MEALS. 08/25/14   Shelva MajesticStephen O Hunter, MD  nystatin cream (MYCOSTATIN) Apply 1 application topically as needed (Patient stated this is PRN for yeast infection).  04/16/14   Historical Provider, MD  Omega-3 Fatty Acids (FISH OIL) 1200 MG CAPS Take 1 capsule by mouth daily.    Historical Provider, MD  omeprazole (PRILOSEC) 20 MG capsule TAKE 1 CAPSULE (20 MG TOTAL) BY MOUTH DAILY. 01/22/15   Shelva MajesticStephen O Hunter, MD  Potassium Citrate 15 MEQ (1620 MG) TBCR Take 1 tablet by mouth 3 (three) times daily.  12/06/13   Historical Provider, MD  valsartan (DIOVAN) 40 MG tablet Take 1 tablet (40 mg  total) by mouth daily. 01/21/15   Tonny BollmanMichael Cooper, MD  warfarin (COUMADIN) 5 MG tablet TAKE AS DIRECTED BY ANTICOAGULATION CLINIC 08/27/14   Shelva MajesticStephen O Hunter, MD   BP 148/60 mmHg  Pulse 110  Temp(Src) 102.5 F (39.2 C) (Oral)  Resp 10  Wt 271 lb (122.925 kg)  SpO2 92% Physical Exam  Constitutional: He is oriented to person, place, and time. He appears well-developed.  HENT:  Head: Normocephalic.  Eyes: Conjunctivae and EOM are normal. No scleral icterus.  Neck: Neck supple. No thyromegaly present.  Cardiovascular: Regular rhythm.  Exam reveals no gallop and no friction  rub.   No murmur heard. Sinus tach  Pulmonary/Chest: No stridor. He has no wheezes. He has no rales. He exhibits no tenderness.  Abdominal: He exhibits no distension. There is no tenderness. There is no rebound.  Musculoskeletal: Normal range of motion. He exhibits no edema.  Lymphadenopathy:    He has no cervical adenopathy.  Neurological: He is oriented to person, place, and time. He exhibits normal muscle tone. Coordination normal.  Skin: No rash noted. No erythema.  Psychiatric: He has a normal mood and affect. His behavior is normal.    ED Course  Procedures (including critical care time) Labs Review Labs Reviewed  CBC - Abnormal; Notable for the following:    WBC 1.1 (*)    Hemoglobin 12.3 (*)    HCT 36.3 (*)    All other components within normal limits  COMPREHENSIVE METABOLIC PANEL - Abnormal; Notable for the following:    Glucose, Bld 216 (*)    Albumin 3.3 (*)    All other components within normal limits  I-STAT CG4 LACTIC ACID, ED - Abnormal; Notable for the following:    Lactic Acid, Venous 2.19 (*)    All other components within normal limits  CULTURE, BLOOD (ROUTINE X 2)  CULTURE, BLOOD (ROUTINE X 2)  PROTIME-INR    Imaging Review Dg Chest 2 View  04/18/2015   CLINICAL DATA:  Cough, congestion, fever and chest pain for 3 days. Initial encounter.  EXAM: CHEST  2 VIEW  COMPARISON:  PA and  lateral chest 09/08/2014 10/14/2014.  FINDINGS: There is left lower lobe airspace disease most consistent with pneumonia. The right lung is clear. Heart size is normal. No pneumothorax or pleural effusion.  IMPRESSION: Left lower lobe pneumonia.  Recommend followup films to clearing.   Electronically Signed   By: Drusilla Kanner M.D.   On: 03/31/2015 10:09     EKG Interpretation None      MDM   Final diagnoses:  None    Admit pneumonia.   Possible sepsis,     Bethann Berkshire, MD 04/22/2015 1426

## 2015-04-03 NOTE — Progress Notes (Signed)
[facility] RN for admission to [phone]

## 2015-04-03 NOTE — ED Notes (Signed)
Pt went to pcp today due to bodyaches, nasal drainage, headache, cough. Pt was called and told to come here due to +pneumonia, elevated WBC, possible sepsis.

## 2015-04-03 NOTE — Progress Notes (Signed)
ANTICOAGULATION CONSULT NOTE - Initial Consult  Pharmacy Consult for Warfarin Indication: factor V Leiden  Allergies  Allergen Reactions  . Piroxicam Other (See Comments)    Blistering on hands  . Rofecoxib Other (See Comments)    REACTION: Reaction not known    Patient Measurements: Weight: 271 lb (122.925 kg) Heparin Dosing Weight:   Vital Signs: Temp: 102.5 F (39.2 C) (07/08 1411) Temp Source: Oral (07/08 1411) BP: 150/66 mmHg (07/08 1415) Pulse Rate: 110 (07/08 1415)  Labs:  Recent Labs  2014/12/31 0940 2014/12/31 1335  HGB 12.1* 12.3*  HCT 35.5* 36.3*  PLT 213.0 176  CREATININE 1.15 1.23    Estimated Creatinine Clearance: 84.1 mL/min (by C-G formula based on Cr of 1.23).   Medical History: Past Medical History  Diagnosis Date  . Hyperlipidemia   . SLEEP APNEA, OBSTRUCTIVE 01/16/2009  . ESOPHAGEAL STRICTURE 01/16/2009  . PSORIASIS     uses a cream  . PULMONARY EMBOLISM     takes Coumadin daily  . NEPHROLITHIASIS, HX OF 02/27/2009  . ANEMIA-UNSPECIFIED 04/28/2010  . CAD (coronary artery disease)     a. s/p DES to RCA x2 in 2005. b. s/p DES to CFX 2009. c. s/p DES PTCA of RCA for in stent restenosis 03/2010. d. s/p Promus DES x2 to RCA 07/16/10(recurrent pain with abnormal Myoview 06/2010; residual nonobstructive disease at cath.  . Leukopenia 11/11/2011  . DEPRESSION   . HYPERTENSION   . GERD   . PONV (postoperative nausea and vomiting)   . Peripheral edema   . Peripheral neuropathy   . Arthritis   . Joint pain   . Seasonal allergies   . History of colon polyps   . History of kidney stones   . DIABETES MELLITUS-TYPE II 09/01/2010  . Plavix resistance   . Heterozygous factor V Leiden mutation   . Obesity     Medications:   (Not in a hospital admission) Scheduled:  . insulin aspart  0-20 Units Subcutaneous TID WC  . insulin aspart  0-5 Units Subcutaneous QHS  . insulin aspart  6 Units Subcutaneous TID WC  . insulin detemir  50 Units Subcutaneous BID    Infusions:  . sodium chloride    . azithromycin    . cefTRIAXone (ROCEPHIN)  IV    . levofloxacin (LEVAQUIN) IV 750 mg (2014/12/31 1408)    Assessment: 64yo male with history of factor V Leiden and PE presents with cough and fever from PCP. Pharmacy is consulted to dose warfarin for factor V Leiden mutation. Hgb 12.3, Plt 176, sCr 1.23, INR 1.51. With subtherapeutic INR and patient having already taken 5mg  of warfarin prior to admission, will give extra dose to get back into range.  PTA Warfarin Dose: 7.5mg  Wed and 5mg  AODs with last dose today  Goal of Therapy:  INR 2-3 Monitor platelets by anticoagulation protocol: Yes   Plan:  Warfarin 5mg  tonight x1 Daily INR/CBC Monitor s/sx of bleeding  Arlean Hoppingorey M. Newman PiesBall, PharmD Clinical Pharmacist Pager [phone] 04/13/2015,2:55 PM

## 2015-04-03 NOTE — Telephone Encounter (Signed)
Pt may be septic > to ER

## 2015-04-03 NOTE — Telephone Encounter (Signed)
Received call from [place] in lab. Pt [name]([name]) has a critical WBC of 1.3 K/uL.  Dr. Sherene SiresWert please advise.

## 2015-04-03 NOTE — Progress Notes (Signed)
Patient stated that he has a headache 8/10. Given Tylenol 650 mg PO, order parameters stated to give for fever. Checked LFTs. Notified Lynch NP.  Also patient is dyspneic and wheezing, inquired about breathing treatments and also patient would like something to rest. Will continue to monitor.

## 2015-04-03 NOTE — ED Notes (Signed)
Attempted report 

## 2015-04-03 NOTE — Progress Notes (Signed)
Adam HumphreyJerry L [name] is a 64 y.o. male patient admitted from ED awake, alert - oriented  X 3 - no acute distress noted.  VSS - Blood pressure 134/57, pulse 105, temperature 98.1 F (36.7 C), temperature source Oral, resp. rate 20, height 5\' 11"  (1.803 m), weight 125.873 kg (277 lb 8 oz), SpO2 99 %.    IV in place, occlusive dsg intact without redness.  Orientation to room, and floor completed with information packet given to patient/family.  Patient declined safety video at this time.  Admission INP armband ID verified with patient/family, and in place.   SR up x 2, fall assessment complete, with patient and family able to verbalize understanding of risk associated with falls, and verbalized understanding to call nsg before up out of bed.  Call light within reach, patient able to voice, and demonstrate understanding.  Skin, clean-dry- intact without evidence of bruising, or skin tears.   No evidence of skin break down noted on exam.     Will cont to eval and treat per MD orders.  Kendall FlackL'ESPERANCE, Regis Hinton C, RN 04/17/2015 4:58 PM

## 2015-04-03 NOTE — ED Notes (Signed)
Pt made aware of bed assignment 

## 2015-04-03 NOTE — H&P (Signed)
History and Physical    [name] JXB:[id number] DOB: 11-24-1950 DOA: 04/18/15  Referring physician: Dr. Estell Harpin PCP: Tana Conch, MD  Specialists: none  Chief Complaint: Cough, shortness of breath  HPI: [name] is a 64 y.o. male has a past medical history significant for hypertension, hyperlipidemia, morbid obesity, obstructive sleep apnea, poorly controlled diabetes mellitus, presents to the emergency room with a chief complaint of cough, fever and chills, as well as shortness of breath for the past 3 days. His symptoms have gotten worse, he went today to see his pulmonologist, Dr. Sherene Sires,  he had blood work done a chest x-ray, which showed a right lower lobe pneumonia, and he was directed for admission. Patient endorses a cough, that just today started to have productive qualities. He denies any chest pain. He denies any palpitations. He denies any abdominal pain, vomiting or diarrhea. He endorses mild nausea. He is also been complaining of a headache for the past 3 days. He has a history of PE and factor V Leiden mutation and he is on Coumadin for the past 6 years. In the emergency room, patient was found to be febrile, tachycardic, tachypnea, maintaining good oxygen saturations on 2 L has a cannula and his blood pressure is within normal limits. His CBC revealed a leukopenia with a white count of 1.1. His lactic acid was borderline elevated at 2.2. TRH was asked for admission for sepsis of pulmonary origin. In the ED, he also received 1 dose of levofloxacin.  Review of Systems: as per history of present illness, otherwise 10 point review of system negative   Past Medical History  Diagnosis Date  . Hyperlipidemia   . SLEEP APNEA, OBSTRUCTIVE 01/16/2009  . ESOPHAGEAL STRICTURE 01/16/2009  . PSORIASIS     uses a cream  . PULMONARY EMBOLISM     takes Coumadin daily  . NEPHROLITHIASIS, HX OF 02/27/2009  . ANEMIA-UNSPECIFIED 04/28/2010  . CAD (coronary artery disease)     a. s/p  DES to RCA x2 in 2005. b. s/p DES to CFX 2009. c. s/p DES PTCA of RCA for in stent restenosis 03/2010. d. s/p Promus DES x2 to RCA 07/16/10(recurrent pain with abnormal Myoview 06/2010; residual nonobstructive disease at cath.  . Leukopenia 11/11/2011  . DEPRESSION   . HYPERTENSION   . GERD   . PONV (postoperative nausea and vomiting)   . Peripheral edema   . Peripheral neuropathy   . Arthritis   . Joint pain   . Seasonal allergies   . History of colon polyps   . History of kidney stones   . DIABETES MELLITUS-TYPE II 09/01/2010  . Plavix resistance   . Heterozygous factor V Leiden mutation   . Obesity    Past Surgical History  Procedure Laterality Date  . Knee arthroscopy  2007    left knee x 2  . Angioplasty  2005 & 2011  . Esophagogastroduodenoscopy    . Colonoscopy    . Carpal tunnel release Bilateral   . Coronary angioplasty with stent placement  2005/2009    5 stents   . Cystoscopy      x 2 due to kidney stones   Social History:  reports that he 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 He has never used smokeless tobacco. He reports that he does not drink alcohol or use illicit drugs.  Allergies  Allergen Reactions  . Piroxicam Other (See Comments)    Blistering on hands  . Rofecoxib Other (See Comments)    REACTION:  Reaction not known    Family History  Problem Relation Age of Onset  . Pulmonary embolism Mother   . Factor V Leiden deficiency Mother   . Diabetes Brother   . Heart failure Brother   . Hypertension Brother   . Asthma Mother   . Factor V Leiden deficiency Brother     Prior to Admission medications   Medication Sig Start Date End Date Taking? Authorizing Provider  atorvastatin (LIPITOR) 40 MG tablet TAKE 1 TABLET (40 MG TOTAL) BY MOUTH DAILY. 01/22/15   Shelva MajesticStephen O Hunter, MD  B-D ULTRAFINE III SHORT PEN 31G X 8 MM MISC USE AS DIRECTED TWICE A DAY 01/06/15   Shelva MajesticStephen O Hunter, MD  cetirizine (ZYRTEC) 10 MG tablet Take 10 mg by mouth daily.    Historical Provider, MD    chlorthalidone (HYGROTON) 25 MG tablet Take 25 mg by mouth every morning.    Historical Provider, MD  citalopram (CELEXA) 20 MG tablet TAKE 1 TABLET BY MOUTH EVERY DAY 02/11/15   Shelva MajesticStephen O Hunter, MD  clobetasol cream (TEMOVATE) 0.05 % Apply 1 application topically 2 (two) times daily as needed (for rash).    Historical Provider, MD  doxycycline (VIBRA-TABS) 100 MG tablet Take 1 tablet (100 mg total) by mouth 2 (two) times daily. 04/04/2015   Nyoka CowdenMichael B Wert, MD  EFFIENT 5 MG TABS tablet TAKE 1 TABLET BY MOUTH DAILY 01/20/15   Tonny BollmanMichael Cooper, MD  fluticasone [facility](FLONASE) 50 MCG/ACT nasal spray Place 2 sprays into both nostrils daily. 09/03/13   Eulis FosterPadonda B Webb, FNP  furosemide (LASIX) 20 MG tablet Take 20 mg by mouth continuous as needed for fluid or edema (Take one tablet  by mouth as needed for fluid rentinion).    Historical Provider, MD  gabapentin (NEURONTIN) 300 MG capsule Take 1 capsule (300 mg total) by mouth 3 (three) times daily. 10/21/14   Nyoka CowdenMichael B Wert, MD  Insulin Detemir (LEVEMIR FLEXTOUCH) 100 UNIT/ML Pen INJECT 60 UNITS INTO THE SKIN 2 (TWO) TIMES DAILY. OR AS DIRECTED Patient taking differently: INJECT 65 UNITS INTO THE SKIN 2 (TWO) TIMES DAILY. OR AS DIRECTED 02/27/15   Shelva MajesticStephen O Hunter, MD  metoprolol succinate (TOPROL-XL) 25 MG 24 hr tablet Take 1 tablet (25 mg total) by mouth daily. 01/14/15   Shelva MajesticStephen O Hunter, MD  Multiple Vitamin (MULTIVITAMIN) capsule Take 1 capsule by mouth daily.    Historical Provider, MD  nitroGLYCERIN (NITROSTAT) 0.4 MG SL tablet Place 0.4 mg under the tongue every 5 (five) minutes as needed for chest pain.    Historical Provider, MD  NOVOLOG 100 UNIT/ML injection INJECT 44 UNITS INTO THE SKIN 3 (THREE) TIMES DAILY BEFORE MEALS. 08/25/14   Shelva MajesticStephen O Hunter, MD  nystatin cream (MYCOSTATIN) Apply 1 application topically as needed (Patient stated this is PRN for yeast infection).  04/16/14   Historical Provider, MD  Omega-3 Fatty Acids (FISH OIL) 1200 MG CAPS Take 1  capsule by mouth daily.    Historical Provider, MD  omeprazole (PRILOSEC) 20 MG capsule TAKE 1 CAPSULE (20 MG TOTAL) BY MOUTH DAILY. 01/22/15   Shelva MajesticStephen O Hunter, MD  Potassium Citrate 15 MEQ (1620 MG) TBCR Take 1 tablet by mouth 3 (three) times daily.  12/06/13   Historical Provider, MD  valsartan (DIOVAN) 40 MG tablet Take 1 tablet (40 mg total) by mouth daily. 01/21/15   Tonny BollmanMichael Cooper, MD  warfarin (COUMADIN) 5 MG tablet TAKE AS DIRECTED BY ANTICOAGULATION CLINIC 08/27/14   Shelva MajesticStephen O Hunter, MD  Physical Exam: Filed Vitals:   04/19/2015 1345 04/06/2015 1400 04/05/2015 1411 04/11/2015 1415  BP: 113/97 142/69  150/66  Pulse: 108 109  110  Temp:   102.5 F (39.2 C)   TempSrc:   Oral   Resp: 40 44  36  Weight:      SpO2: 96% 97%  98%     General:  No apparent distress, appears comfortable  Eyes: PERRL, EOMI, no scleral icterus  ENT: moist oropharynx  Neck: supple, no lymphadenopathy  Cardiovascular: regular rate without MRG; 2+ peripheral pulses, no JVD, no peripheral edema  Respiratory: overall decreased breath sounds due to body habitus, faint rales in the right lower lobe, no wheezing   Abdomen: soft, non tender to palpation, positive bowel sounds, no guarding, no rebound  Skin: no rashes  Musculoskeletal: normal bulk and tone, no joint swelling  Psychiatric: normal mood and affect  Neurologic: grossly nonfocal   Labs on Admission:  Basic Metabolic Panel:  Recent Labs Lab 03/28/2015 0940 04/04/2015 1335  NA 136 135  K 3.8 4.0  CL 101 103  CO2 24 24  GLUCOSE 256* 216*  BUN 19 19  CREATININE 1.15 1.23  CALCIUM 10.1 9.6   Liver Function Tests:  Recent Labs Lab 04/04/2015 1335  AST 25  ALT 23  ALKPHOS 118  BILITOT 1.0  PROT 7.3  ALBUMIN 3.3*   No results for input(s): LIPASE, AMYLASE in the last 168 hours. No results for input(s): AMMONIA in the last 168 hours. CBC:  Recent Labs Lab 04/19/2015 0940 04/18/2015 1335  WBC 1.3 Repeated and verified X2.* 1.1*   NEUTROABS 0.8*  --   HGB 12.1* 12.3*  HCT 35.5* 36.3*  MCV 82.0 83.1  PLT 213.0 176   Cardiac Enzymes: No results for input(s): CKTOTAL, CKMB, CKMBINDEX, TROPONINI in the last 168 hours.  BNP (last 3 results) No results for input(s): BNP in the last 8760 hours.  ProBNP (last 3 results)  Recent Labs  09/08/14 1508 10/14/14 1017  PROBNP 19.0 15.0    CBG: No results for input(s): GLUCAP in the last 168 hours.  Radiological Exams on Admission: Dg Chest 2 View  04/14/2015   CLINICAL DATA:  Cough, congestion, fever and chest pain for 3 days. Initial encounter.  EXAM: CHEST  2 VIEW  COMPARISON:  PA and lateral chest 09/08/2014 10/14/2014.  FINDINGS: There is left lower lobe airspace disease most consistent with pneumonia. The right lung is clear. Heart size is normal. No pneumothorax or pleural effusion.  IMPRESSION: Left lower lobe pneumonia.  Recommend followup films to clearing.   Electronically Signed   By: Drusilla Kanner M.D.   On: 04/24/2015 10:09    EKG: Independently reviewed.  Assessment/Plan Active Problems:   Poorly controlled diabetes mellitus   Hyperlipidemia   OBESITY-MORBID (>100')   Factor V Leiden   Depression   OSA on CPAP   Essential hypertension   H/o Pulmonary embolism (Factor V Leiden heterozygous)   Leukopenia   Chronic rhinitis   Fever and chills   CAP (community acquired pneumonia)   Sepsis due to community acquired pneumonia  - Patient is stable in the emergency room, he is not hypotensive, he is satting well on 2 L nasal cannula, was admitted to telemetry  - Provide ceftriaxone and azithromycin  - sputum blood cultures obtained  - Urine Legionella and strep pneumo antigens - Repeat lactic acid in 6 hours after hydration  Acute hypoxic respiratory failure - Mild, requiring only 2 L has  a cannula, closely monitor respiratory status  Poorly controlled diabetes mellitus - We'll start his home Levemir at 50 units twice a day (at home he  takes 65 twice a day but he has been endorsing poor appetite), as well as sliding scale insulin plus scheduled mealtime insulin  Leukopenia - Likely in the setting of sepsis, repeat CBC in the morning  Hypertension - Resume home medications  History of PE in the setting of factor V Leiden - Coumadin per pharmacy  Obstructive sleep apnea - Resume his home CPAP  Depression - Resume his medication  Morbid obesity - We'll order nutritional consult  HLD - Lipitor   Diet: carb modified/heart healthy Fluids: NS DVT Prophylaxis: Coumadin  Code Status: Full  Family Communication: d/w wife and son bedside  Disposition Plan: admit to telemetry  Time spent: 56  Costin M. Elvera Lennox, MD [facility] Pager [phone]  If 7PM-7AM, please contact night-coverage [email or web address] Password [email or web address] 19-Apr-2015, 2:47 PM

## 2015-04-04 ENCOUNTER — Inpatient Hospital Stay (HOSPITAL_COMMUNITY): Payer: BLUE CROSS/BLUE SHIELD | Admitting: Certified Registered"

## 2015-04-04 ENCOUNTER — Inpatient Hospital Stay (HOSPITAL_COMMUNITY): Payer: BLUE CROSS/BLUE SHIELD

## 2015-04-04 DIAGNOSIS — I469 Cardiac arrest, cause unspecified: Secondary | ICD-10-CM

## 2015-04-04 LAB — HIV ANTIBODY (ROUTINE TESTING W REFLEX): HIV Screen 4th Generation wRfx: NONREACTIVE

## 2015-04-04 LAB — GLUCOSE, CAPILLARY
GLUCOSE-CAPILLARY: 263 mg/dL — AB (ref 65–99)
GLUCOSE-CAPILLARY: 183 mg/dL — AB (ref 65–99)
Glucose-Capillary: 270 mg/dL — ABNORMAL HIGH (ref 65–99)
Glucose-Capillary: 328 mg/dL — ABNORMAL HIGH (ref 65–99)
Glucose-Capillary: 302 mg/dL — ABNORMAL HIGH (ref 65–99)
Glucose-Capillary: 295 mg/dL — ABNORMAL HIGH (ref 65–99)
Glucose-Capillary: 304 mg/dL — ABNORMAL HIGH (ref 65–99)
Glucose-Capillary: 251 mg/dL — ABNORMAL HIGH (ref 65–99)
Glucose-Capillary: 239 mg/dL — ABNORMAL HIGH (ref 65–99)
Glucose-Capillary: 213 mg/dL — ABNORMAL HIGH (ref 65–99)
Glucose-Capillary: 204 mg/dL — ABNORMAL HIGH (ref 65–99)
Glucose-Capillary: 165 mg/dL — ABNORMAL HIGH (ref 65–99)
Glucose-Capillary: 137 mg/dL — ABNORMAL HIGH (ref 65–99)
Glucose-Capillary: 165 mg/dL — ABNORMAL HIGH (ref 65–99)

## 2015-04-04 LAB — BASIC METABOLIC PANEL
Anion gap: 16 — ABNORMAL HIGH (ref 5–15)
BUN: 16 mg/dL (ref 6–20)
CHLORIDE: 101 mmol/L (ref 101–111)
CO2: 16 mmol/L — ABNORMAL LOW (ref 22–32)
CREATININE: 1.31 mg/dL — AB (ref 0.61–1.24)
Calcium: 9.2 mg/dL (ref 8.9–10.3)
GFR calc non Af Amer: 57 mL/min — ABNORMAL LOW (ref >60)
Glucose, Bld: 298 mg/dL — ABNORMAL HIGH (ref 65–99)
POTASSIUM: 3.7 mmol/L (ref 3.5–5.1)
Sodium: 133 mmol/L — ABNORMAL LOW (ref 135–145)

## 2015-04-04 LAB — CBC
HCT: 35.9 % — ABNORMAL LOW (ref 39.0–52.0)
Hemoglobin: 11.7 g/dL — ABNORMAL LOW (ref 13.0–17.0)
MCH: 27.8 pg (ref 26.0–34.0)
MCHC: 32.6 g/dL (ref 30.0–36.0)
MCV: 85.3 fL (ref 78.0–100.0)
PLATELETS: 219 10*3/uL (ref 150–400)
RBC: 4.21 MIL/uL — AB (ref 4.22–5.81)
RDW: 15.5 % (ref 11.5–15.5)
WBC: 4.3 10*3/uL (ref 4.0–10.5)

## 2015-04-04 LAB — POCT I-STAT 3, ART BLOOD GAS (G3+)
ACID-BASE DEFICIT: 7 mmol/L — AB (ref 0.0–2.0)
Acid-base deficit: 7 mmol/L — ABNORMAL HIGH (ref 0.0–2.0)
BICARBONATE: 19.5 meq/L — AB (ref 20.0–24.0)
BICARBONATE: 18.2 meq/L — AB (ref 20.0–24.0)
O2 SAT: 93 %
O2 Saturation: 100 %
PCO2 ART: 38.1 mmHg (ref 35.0–45.0)
Patient temperature: 101.2
TCO2: 21 mmol/L (ref 0–100)
TCO2: 19 mmol/L (ref 0–100)
pCO2 arterial: 43.7 mmHg (ref 35.0–45.0)
pH, Arterial: 7.262 — ABNORMAL LOW (ref 7.350–7.450)
pH, Arterial: 7.295 — ABNORMAL LOW (ref 7.350–7.450)
pO2, Arterial: 248 mmHg — ABNORMAL HIGH (ref 80.0–100.0)
pO2, Arterial: 79 mmHg — ABNORMAL LOW (ref 80.0–100.0)

## 2015-04-04 LAB — PROTIME-INR
INR: 1.73 — ABNORMAL HIGH (ref 0.00–1.49)
Prothrombin Time: 20.3 seconds — ABNORMAL HIGH (ref 11.6–15.2)

## 2015-04-04 LAB — URINALYSIS, ROUTINE W REFLEX MICROSCOPIC
Bilirubin Urine: NEGATIVE
GLUCOSE, UA: 500 mg/dL — AB
Ketones, ur: 15 mg/dL — AB
LEUKOCYTES UA: NEGATIVE
Nitrite: NEGATIVE
Protein, ur: >300 mg/dL — AB
Specific Gravity, Urine: 1.018 (ref 1.005–1.030)
UROBILINOGEN UA: 0.2 mg/dL (ref 0.0–1.0)
pH: 5 (ref 5.0–8.0)

## 2015-04-04 LAB — URINE MICROSCOPIC-ADD ON

## 2015-04-04 LAB — COMPREHENSIVE METABOLIC PANEL
ALK PHOS: 106 U/L (ref 38–126)
ALT: 28 U/L (ref 17–63)
AST: 32 U/L (ref 15–41)
Albumin: 2.6 g/dL — ABNORMAL LOW (ref 3.5–5.0)
Anion gap: 11 (ref 5–15)
BUN: 18 mg/dL (ref 6–20)
CO2: 21 mmol/L — ABNORMAL LOW (ref 22–32)
Calcium: 8.5 mg/dL — ABNORMAL LOW (ref 8.9–10.3)
Chloride: 102 mmol/L (ref 101–111)
Creatinine, Ser: 1.38 mg/dL — ABNORMAL HIGH (ref 0.61–1.24)
GFR calc non Af Amer: 54 mL/min — ABNORMAL LOW (ref >60)
GLUCOSE: 327 mg/dL — AB (ref 65–99)
Potassium: 4 mmol/L (ref 3.5–5.1)
SODIUM: 134 mmol/L — AB (ref 135–145)
Total Bilirubin: 0.8 mg/dL (ref 0.3–1.2)
Total Protein: 6.7 g/dL (ref 6.5–8.1)

## 2015-04-04 LAB — BLOOD GAS, ARTERIAL
Acid-base deficit: 13.9 mmol/L — ABNORMAL HIGH (ref 0.0–2.0)
Bicarbonate: 15.2 mEq/L — ABNORMAL LOW (ref 20.0–24.0)
DRAWN BY: 24513
FIO2: 100 %
O2 Saturation: 98.4 %
PCO2 ART: 62.3 mmHg — AB (ref 35.0–45.0)
PH ART: 7.018 — AB (ref 7.350–7.450)
PO2 ART: 161 mmHg — AB (ref 80.0–100.0)
Patient temperature: 98.6
TCO2: 17.2 mmol/L (ref 0–100)

## 2015-04-04 LAB — PROCALCITONIN: PROCALCITONIN: 0.74 ng/mL

## 2015-04-04 LAB — TROPONIN I: Troponin I: 0.13 ng/mL — ABNORMAL HIGH (ref <0.031)

## 2015-04-04 LAB — MRSA PCR SCREENING: MRSA by PCR: NEGATIVE

## 2015-04-04 MED ORDER — INSULIN REGULAR HUMAN 100 UNIT/ML IJ SOLN
INTRAMUSCULAR | Status: AC
Start: 2015-04-04 — End: 2015-04-04
  Administered 2015-04-04: 2.4 [IU]/h via INTRAVENOUS
  Filled 2015-04-04: qty 2.5

## 2015-04-04 MED ORDER — CETYLPYRIDINIUM CHLORIDE 0.05 % MT LIQD
7.0000 mL | Freq: Four times a day (QID) | OROMUCOSAL | Status: DC
  Administered 2015-04-04 – 2015-04-16 (×47): 7 mL via OROMUCOSAL

## 2015-04-04 MED ORDER — SODIUM CHLORIDE 0.9 % IV SOLN
INTRAVENOUS | Status: DC
  Administered 2015-04-04 – 2015-04-05 (×4): via INTRAVENOUS

## 2015-04-04 MED ORDER — ACETAMINOPHEN 160 MG/5ML PO SOLN: 500.0000 mg | Freq: Four times a day (QID) | ORAL | Status: DC | PRN

## 2015-04-04 MED ORDER — HEPARIN SODIUM (PORCINE) 5000 UNIT/ML IJ SOLN: 5000.0000 [IU] | Freq: Three times a day (TID) | INTRAMUSCULAR | Status: DC

## 2015-04-04 MED ORDER — ACETAMINOPHEN 650 MG RE SUPP: 650.0000 mg | RECTAL | Status: DC | PRN

## 2015-04-04 MED ORDER — INSULIN ASPART 100 UNIT/ML ~~LOC~~ SOLN
10.0000 [IU] | Freq: Once | SUBCUTANEOUS | Status: AC
  Administered 2015-04-04: 10 [IU] via SUBCUTANEOUS

## 2015-04-04 MED ORDER — CHLORHEXIDINE GLUCONATE 0.12 % MT SOLN
15.0000 mL | Freq: Two times a day (BID) | OROMUCOSAL | Status: DC
  Administered 2015-04-04 – 2015-04-16 (×25): 15 mL via OROMUCOSAL
  Filled 2015-04-04 (×24): qty 15

## 2015-04-04 MED ORDER — METOPROLOL TARTRATE 12.5 MG HALF TABLET
12.5000 mg | ORAL_TABLET | Freq: Two times a day (BID) | ORAL | Status: DC
  Administered 2015-04-04 – 2015-04-12 (×17): 12.5 mg
  Filled 2015-04-04 (×18): qty 1

## 2015-04-04 MED ORDER — INSULIN GLARGINE 100 UNIT/ML ~~LOC~~ SOLN
20.0000 [IU] | Freq: Every day | SUBCUTANEOUS | Status: DC
  Administered 2015-04-04 – 2015-04-05 (×2): 20 [IU] via SUBCUTANEOUS
  Filled 2015-04-04 (×2): qty 0.2

## 2015-04-04 MED ORDER — METOPROLOL TARTRATE 1 MG/ML IV SOLN
2.5000 mg | INTRAVENOUS | Status: DC | PRN
  Administered 2015-04-06: 5 mg via INTRAVENOUS
  Filled 2015-04-04 (×2): qty 5

## 2015-04-04 MED ORDER — AZITHROMYCIN 500 MG IV SOLR
500.0000 mg | INTRAVENOUS | Status: DC
  Administered 2015-04-04 – 2015-04-05 (×2): 500 mg via INTRAVENOUS
  Filled 2015-04-04 (×2): qty 500

## 2015-04-04 MED ORDER — INSULIN ASPART 100 UNIT/ML ~~LOC~~ SOLN
0.0000 [IU] | SUBCUTANEOUS | Status: DC
  Administered 2015-04-04: 3 [IU] via SUBCUTANEOUS
  Administered 2015-04-05: 5 [IU] via SUBCUTANEOUS
  Administered 2015-04-05: 3 [IU] via SUBCUTANEOUS
  Administered 2015-04-05: 5 [IU] via SUBCUTANEOUS
  Administered 2015-04-05: 8 [IU] via SUBCUTANEOUS
  Administered 2015-04-05: 3 [IU] via SUBCUTANEOUS
  Administered 2015-04-05: 8 [IU] via SUBCUTANEOUS
  Administered 2015-04-06 (×2): 5 [IU] via SUBCUTANEOUS
  Administered 2015-04-06 (×2): 8 [IU] via SUBCUTANEOUS
  Administered 2015-04-06: 5 [IU] via SUBCUTANEOUS
  Administered 2015-04-06 – 2015-04-07 (×2): 8 [IU] via SUBCUTANEOUS
  Administered 2015-04-07 (×2): 11 [IU] via SUBCUTANEOUS

## 2015-04-04 MED ORDER — WARFARIN SODIUM 7.5 MG PO TABS
7.5000 mg | ORAL_TABLET | Freq: Once | ORAL | Status: AC
  Administered 2015-04-04: 7.5 mg via ORAL
  Filled 2015-04-04: qty 1

## 2015-04-04 MED ORDER — SODIUM CHLORIDE 0.9 % IV BOLUS (SEPSIS)
1000.0000 mL | Freq: Once | INTRAVENOUS | Status: AC
  Administered 2015-04-04: 1000 mL via INTRAVENOUS

## 2015-04-04 MED ORDER — PROPOFOL 1000 MG/100ML IV EMUL
INTRAVENOUS | Status: AC
  Filled 2015-04-04: qty 100

## 2015-04-04 MED ORDER — DEXTROSE 5 % IV SOLN
1.0000 g | INTRAVENOUS | Status: DC
  Administered 2015-04-04 – 2015-04-05 (×2): 1 g via INTRAVENOUS
  Filled 2015-04-04 (×2): qty 10

## 2015-04-04 MED ORDER — ACETAMINOPHEN 160 MG/5ML PO SOLN
500.0000 mg | Freq: Four times a day (QID) | ORAL | Status: DC | PRN
  Administered 2015-04-04 – 2015-04-07 (×9): 500 mg via ORAL
  Filled 2015-04-04 (×9): qty 20.3

## 2015-04-04 MED ORDER — PANTOPRAZOLE SODIUM 40 MG IV SOLR
40.0000 mg | Freq: Every day | INTRAVENOUS | Status: DC
  Filled 2015-04-04: qty 40

## 2015-04-04 MED ORDER — FENTANYL CITRATE (PF) 100 MCG/2ML IJ SOLN
INTRAMUSCULAR | Status: AC
  Filled 2015-04-04: qty 2

## 2015-04-04 MED ORDER — FENTANYL CITRATE (PF) 100 MCG/2ML IJ SOLN
100.0000 ug | INTRAMUSCULAR | Status: DC | PRN
  Administered 2015-04-04 – 2015-04-06 (×10): 100 ug via INTRAVENOUS
  Administered 2015-04-06: 50 ug via INTRAVENOUS
  Administered 2015-04-06: 100 ug via INTRAVENOUS
  Filled 2015-04-04 (×11): qty 2

## 2015-04-04 MED ORDER — FAMOTIDINE IN NACL 20-0.9 MG/50ML-% IV SOLN
20.0000 mg | Freq: Two times a day (BID) | INTRAVENOUS | Status: DC
  Administered 2015-04-04 – 2015-04-05 (×3): 20 mg via INTRAVENOUS
  Filled 2015-04-04 (×4): qty 50

## 2015-04-04 MED ORDER — PROPOFOL 1000 MG/100ML IV EMUL
5.0000 ug/kg/min | INTRAVENOUS | Status: DC
  Administered 2015-04-04: 20 ug/kg/min via INTRAVENOUS
  Administered 2015-04-04: 30 ug/kg/min via INTRAVENOUS
  Administered 2015-04-04: 20 ug/kg/min via INTRAVENOUS
  Administered 2015-04-05: 40 ug/kg/min via INTRAVENOUS
  Administered 2015-04-05 (×2): 20 ug/kg/min via INTRAVENOUS
  Administered 2015-04-05: 25 ug/kg/min via INTRAVENOUS
  Administered 2015-04-06: 30 ug/kg/min via INTRAVENOUS
  Administered 2015-04-06: 40 ug/kg/min via INTRAVENOUS
  Administered 2015-04-06: 30 ug/kg/min via INTRAVENOUS
  Administered 2015-04-06: 40 ug/kg/min via INTRAVENOUS
  Administered 2015-04-07: 30 ug/kg/min via INTRAVENOUS
  Administered 2015-04-07: 10 ug/kg/min via INTRAVENOUS
  Administered 2015-04-07: 30 ug/kg/min via INTRAVENOUS
  Administered 2015-04-08: 40 ug/kg/min via INTRAVENOUS
  Administered 2015-04-08: 30 ug/kg/min via INTRAVENOUS
  Administered 2015-04-08: 40 ug/kg/min via INTRAVENOUS
  Administered 2015-04-08: 30 ug/kg/min via INTRAVENOUS
  Administered 2015-04-08: 20 ug/kg/min via INTRAVENOUS
  Administered 2015-04-08: 40 ug/kg/min via INTRAVENOUS
  Filled 2015-04-04 (×8): qty 100
  Filled 2015-04-04: qty 200
  Filled 2015-04-04 (×11): qty 100

## 2015-04-04 MED ORDER — FENTANYL CITRATE (PF) 100 MCG/2ML IJ SOLN: 100.0000 ug | INTRAMUSCULAR | Status: DC | PRN

## 2015-04-04 NOTE — Progress Notes (Signed)
Initial Nutrition Assessment  DOCUMENTATION CODES: Obesity unspecified  INTERVENTION: If unable to extubate and medically ablr recommend TF via OGT with VHP at 25 ml/h and Prostat 30 ml BID on day 1; on day 2, increase to goal rate of 45 ml/h (1080 ml per day) and prostat to 60 ml BID to provide 1480 kcals (receives 198 kcals from propofol), 155 gm protein, 903 ml free water daily.  NUTRITION DIAGNOSIS: Inadequate oral intake related to inability to eat as evidenced by NPO status.  GOAL: Provide needs based on ASPEN/SCCM guidelines  MONITOR: Vent status, Weight trends, Labs, I & O's  REASON FOR ASSESSMENT: Initially consulted for Diet Ed for Morbid obesity- not appropriate at this time d/t intubated Ventilator    ASSESSMENT: 64 y.o. male PMHx  HTN, HLD,  obesity, OSA, DM, presents to the ED with a chief complaint of cough, fever and chills, as well as SOB for the past 3 days. Xray shows PNA. Was admitted and coded. Required CPR and intubation.  Patient is currently intubated on ventilator support MV: 14.3 L/min Temp (24hrs), Avg:100.7 F (38.2 C), Min:98.1 F (36.7 C), Max:102.5 F (39.2 C)  Propofol: 7.5 ml/hr = 198 kcals/day  Spoke with family at bedside. They report he was eating well and had no changes in weight recently. Pt took fish oil and mvi at home.   Height: Ht Readings from Last 1 Encounters:  04/04/15 5\' 11"  (1.803 m)    Weight: Wt Readings from Last 1 Encounters:  04/04/15 276 lb 0.3 oz (125.2 kg)    Ideal Body Weight:  78.2 kg  Wt Readings from Last 10 Encounters:  04/04/15 276 lb 0.3 oz (125.2 kg)  01-22-15 271 lb 6.4 oz (123.106 kg)  03/26/15 278 lb (126.1 kg)  01/21/15 272 lb 6.4 oz (123.56 kg)  01/14/15 279 lb (126.554 kg)  10/21/14 272 lb (123.378 kg)  10/14/14 272 lb (123.378 kg)  10/06/14 274 lb (124.286 kg)  09/08/14 276 lb 9.6 oz (125.465 kg)  09/02/14 279 lb (126.554 kg)  Admit weight: 271 lbs (123 kg)  BMI:  Body mass index is  38.51 kg/(m^2).  BMI 37.8 using admit weight  Estimated Nutritional Needs: Kcal:  1353-1722 kcal (11-14 kcal/kg) Protein:  156 g Pro (2g/kg IBW) Fluid:  Per MD  Skin:  Reviewed, no issues  Diet Order:   NPO  EDUCATION NEEDS: Education needs not appropriate at this time   Intake/Output Summary (Last 24 hours) at 04/04/15 1159 Last data filed at 04/04/15 1100  Gross per 24 hour  Intake 2042.19 ml  Output   1600 ml  Net 442.19 ml    Last BM:  7/8  Christophe LouisNathan Boyd Litaker RD, LDN Nutrition Pager: [phone] 04/04/2015 11:59 AM

## 2015-04-04 NOTE — Progress Notes (Addendum)
100- Patient alert and oriented, sitting up in chair.   139-Patient found on floor face down with CPAP mask in place by [employer]Kathy RN. Patient was initially tachycardiac and then became bradycardic. Code Blue called. Initially checked carotid pulse, unable to palpate and patient went into PEA. Skin color bluish.  Patient turned over and chest compressions started and lasting two minutes with pulses recovered. Patient was also bagged without requiring ACLS medication or shocks. Lynch notified. Labs drawn. Glucose was 270.  153- Patient had ETT inserted. Patient transferred to 60M 07 at 212. Wife notified. Report given to nurse.   RTs involved: Vista LawmanFrances Alamar RT, Martie RoundKasey Joyce RT [id number] [facility]: Bernie Coveyrystal Liles RN Physician: Jamal Collinarly R MD Phlebotomist: Elisabeth PigeonNicole Bailey RN  RNs this writer Susie CassetteJoy Shaquasia Caponigro RN, Andris FlurryWesley H RN, Idelle JoNikki Wiley, Nelda MarseilleJenny Thacker RN, Judee Clararimaine Butler RN NP: Elray McgregorMary Lynch NP NT: Tamera ReasonViviane Lea NT

## 2015-04-04 NOTE — Consult Note (Addendum)
PULMONARY / CRITICAL CARE MEDICINE   Name: BONIFACE GOFFE MRN: [id number] DOB: January 08, 1951    ADMISSION DATE:  04/02/2015 CONSULTATION DATE:  04/04/2015  REFERRING MD : Triad  CHIEF COMPLAINT:  AMS  INITIAL PRESENTATION:  64 y/o admitted for community acquired pneumonia was found down with bradycardia and apneic, was intubated on and CCM consulted  STUDIES:  7/8 CXR Left lower lobe pneumonia. Recommend followup films to clearing.  SIGNIFICANT EVENTS: 7/8 admitted for community acquired pneumonia 7/9 acute AMS, respiratory distress   HISTORY OF PRESENT ILLNESS:   History obtained from chart and nursing staff as pt intubated  64 y/o with PMH of DM, HTN, PE admitted for community acquired pneumonia. Remained stable until 7/9 at approximately 2 am when he was found down on the floor apneic. Code blue was called. He received chest compressions with no requirement for defibrillation but was intubated for respiratory distress. CCM was consulted for further management  Of not earlier in the night he was noted to have bradycardia in the 30s which quickly rebounded to normal  PAST MEDICAL HISTORY :   has a past medical history of Hyperlipidemia; SLEEP APNEA, OBSTRUCTIVE (01/16/2009); ESOPHAGEAL STRICTURE (01/16/2009); PSORIASIS; PULMONARY EMBOLISM; NEPHROLITHIASIS, HX OF (02/27/2009); ANEMIA-UNSPECIFIED (04/28/2010); CAD (coronary artery disease); Leukopenia (11/11/2011); DEPRESSION; HYPERTENSION; GERD; PONV (postoperative nausea and vomiting); Peripheral edema; Peripheral neuropathy; Arthritis; Joint pain; Seasonal allergies; History of colon polyps; History of kidney stones; DIABETES MELLITUS-TYPE II (09/01/2010); Plavix resistance; Heterozygous factor V Leiden mutation; Obesity; and Pneumonia (04/25/2015).  has past surgical history that includes Knee arthroscopy (2007); Angioplasty (2005 & 2011); Esophagogastroduodenoscopy; Colonoscopy; Carpal tunnel release (Bilateral); Coronary angioplasty with stent  (2005/2009); and Cystoscopy. Prior to Admission medications   Medication Sig Start Date End Date Taking? Authorizing Provider  atorvastatin (LIPITOR) 40 MG tablet TAKE 1 TABLET (40 MG TOTAL) BY MOUTH DAILY. 01/22/15  Yes Shelva Majestic, MD  B-D ULTRAFINE III SHORT PEN 31G X 8 MM MISC USE AS DIRECTED TWICE A DAY 01/06/15  Yes Shelva Majestic, MD  cetirizine (ZYRTEC) 10 MG tablet Take 10 mg by mouth daily.   Yes Historical Provider, MD  chlorthalidone (HYGROTON) 25 MG tablet Take 25 mg by mouth every morning.   Yes Historical Provider, MD  citalopram (CELEXA) 20 MG tablet TAKE 1 TABLET BY MOUTH EVERY DAY 02/11/15  Yes Shelva Majestic, MD  clobetasol cream (TEMOVATE) 0.05 % Apply 1 application topically 2 (two) times daily as needed (for rash).   Yes Historical Provider, MD  doxycycline (VIBRA-TABS) 100 MG tablet Take 1 tablet (100 mg total) by mouth 2 (two) times daily. 03/31/2015  Yes Nyoka Cowden, MD  EFFIENT 5 MG TABS tablet TAKE 1 TABLET BY MOUTH DAILY 01/20/15  Yes Tonny Bollman, MD  fluticasone [facility]) 50 MCG/ACT nasal spray Place 2 sprays into both nostrils daily. 09/03/13  Yes Eulis Foster, FNP  furosemide (LASIX) 20 MG tablet Take 20 mg by mouth continuous as needed for fluid or edema (Take one tablet  by mouth as needed for fluid rentinion).   Yes Historical Provider, MD  gabapentin (NEURONTIN) 300 MG capsule Take 1 capsule (300 mg total) by mouth 3 (three) times daily. 10/21/14  Yes Nyoka Cowden, MD  glipiZIDE (GLUCOTROL) 10 MG tablet Take 10 mg by mouth 2 (two) times daily. 12/30/14  Yes Historical Provider, MD  Insulin Detemir (LEVEMIR FLEXTOUCH) 100 UNIT/ML Pen INJECT 60 UNITS INTO THE SKIN 2 (TWO) TIMES DAILY. OR AS DIRECTED Patient taking differently: INJECT 65  UNITS INTO THE SKIN 2 (TWO) TIMES DAILY. OR AS DIRECTED 02/27/15  Yes Shelva Majestic, MD  losartan (COZAAR) 100 MG tablet Take 100 mg by mouth daily. 01/06/15  Yes Historical Provider, MD  metoprolol succinate (TOPROL-XL) 25 MG  24 hr tablet Take 1 tablet (25 mg total) by mouth daily. 01/14/15  Yes Shelva Majestic, MD  Multiple Vitamin (MULTIVITAMIN) capsule Take 1 capsule by mouth daily.   Yes Historical Provider, MD  nitroGLYCERIN (NITROSTAT) 0.4 MG SL tablet Place 0.4 mg under the tongue every 5 (five) minutes as needed for chest pain.   Yes Historical Provider, MD  NOVOLOG 100 UNIT/ML injection INJECT 44 UNITS INTO THE SKIN 3 (THREE) TIMES DAILY BEFORE MEALS. 08/25/14  Yes Shelva Majestic, MD  nystatin cream (MYCOSTATIN) Apply 1 application topically as needed (Patient stated this is PRN for yeast infection).  04/16/14  Yes Historical Provider, MD  Omega-3 Fatty Acids (FISH OIL) 1200 MG CAPS Take 1 capsule by mouth daily.   Yes Historical Provider, MD  omeprazole (PRILOSEC) 20 MG capsule TAKE 1 CAPSULE (20 MG TOTAL) BY MOUTH DAILY. 01/22/15  Yes Shelva Majestic, MD  Potassium Citrate 15 MEQ (1620 MG) TBCR Take 1 tablet by mouth 3 (three) times daily.  12/06/13  Yes Historical Provider, MD  valsartan (DIOVAN) 40 MG tablet Take 1 tablet (40 mg total) by mouth daily. 01/21/15  Yes Tonny Bollman, MD  warfarin (COUMADIN) 5 MG tablet TAKE AS DIRECTED BY ANTICOAGULATION CLINIC Patient taking differently: TAKE AS DIRECTED BY ANTICOAGULATION CLINIC. pt takes  everyday except 7.5mg  on Wednesdays 08/27/14  Yes Shelva Majestic, MD   Allergies  Allergen Reactions  . Piroxicam Other (See Comments)    Blistering on hands  . Rofecoxib Other (See Comments)    REACTION: Reaction not known    FAMILY HISTORY:  indicated that his mother is alive. He indicated that his father is alive. He indicated that his brother is deceased.  SOCIAL HISTORY:  reports that he 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 He has never used smokeless tobacco. He reports that he does not drink alcohol or use illicit drugs.  VITAL SIGNS: Temp:  [98.1 F (36.7 C)-102.5 F (39.2 C)] 100.5 F (38.1 C) (07/08 2127) Pulse Rate:  [68-116] 68 (07/09 0154) Resp:  [10-46] 19  (07/08 2127) BP: (113-153)/(52-97) 135/52 mmHg (07/09 0154) SpO2:  [91 %-99 %] 92 % (07/08 2127) Weight:  [271 lb (122.925 kg)-277 lb 8 oz (125.873 kg)] 277 lb 8 oz (125.873 kg) (07/08 1642) HEMODYNAMICS:   VENTILATOR SETTINGS:   INTAKE / OUTPUT:  Intake/Output Summary (Last 24 hours) at 04/04/15 0234 Last data filed at 04/08/2015 1841  Gross per 24 hour  Intake    578 ml  Output    200 ml  Net    378 ml    PHYSICAL EXAMINATION: General:  Mild distress, laying on bed Neuro:  Not responsive to commands, painful stimuli HEENT:  Bruise over nose with questionable nose deviation to right, ET in place. PERRL Cardiovascular:  RRR Lungs:  Bilateral equal breath sounds Abdomen:  Obese, BS Musculoskeletal:  So deficits Skin:  No rashes  LABS:  CBC  Recent Labs Lab 03/31/2015 0940 04/17/2015 1335 04/04/15 0155  WBC 1.3 Repeated and verified X2.* 1.1* 4.3  HGB 12.1* 12.3* 11.7*  HCT 35.5* 36.3* 35.9*  PLT 213.0 176 219   Coag's  Recent Labs Lab 04/14/2015 1335  INR 1.51*   BMET  Recent Labs Lab 04/21/2015 0940 04/23/2015 1335  NA 136 135  K 3.8 4.0  CL 101 103  CO2 24 24  BUN 19 19  CREATININE 1.15 1.23  GLUCOSE 256* 216*   Electrolytes  Recent Labs Lab April 24, 2015 0940 2015-04-24 1335  CALCIUM 10.1 9.6   Sepsis Markers  Recent Labs Lab 24-Apr-2015 1346 04/24/2015 1558 April 24, 2015 1924  LATICACIDVEN 2.19* 0.94 1.8   ABG  Recent Labs Lab 04/04/15 0205  PHART 7.018*  PCO2ART 62.3*  PO2ART 161*   Liver Enzymes  Recent Labs Lab 04/24/15 1335  AST 25  ALT 23  ALKPHOS 118  BILITOT 1.0  ALBUMIN 3.3*   Cardiac Enzymes No results for input(s): TROPONINI, PROBNP in the last 168 hours. Glucose  Recent Labs Lab 2015-04-24 1525 2015/04/24 1639 04-24-15 2121 04/04/15 0157  GLUCAP 218* 235* 199* 270*    Imaging Dg Chest 2 View  Apr 24, 2015   CLINICAL DATA:  Cough, congestion, fever and chest pain for 3 days. Initial encounter.  EXAM: CHEST  2 VIEW   COMPARISON:  PA and lateral chest 09/08/2014 10/14/2014.  FINDINGS: There is left lower lobe airspace disease most consistent with pneumonia. The right lung is clear. Heart size is normal. No pneumothorax or pleural effusion.  IMPRESSION: Left lower lobe pneumonia.  Recommend followup films to clearing.   Electronically Signed   By: Drusilla Kanner M.D.   On: 04-24-2015 10:09     ASSESSMENT / PLAN:  PULMONARY OETT 7/9 >> A: Community acquired pneumonia  Sepsis Respiratory acidosis Compensatory metabolic alkalosis OSA with CPAP requrement P:   Full mechanical support, wean as able. VAP bundle. SBT when tolerated F/u abg albuterol q6 PRN CXR in AM. Continue CPAP when extubated   CARDIOVASCULAR CVL- none,  peripheral lines x2 A:  Hypertension- improved with appropriate size cuff and propofol S/p Cardiac arrest P:  Sepsis is contraindication for cooling. Follow pressures after propofol initiated Hold home antihypertensives Telemetry  RENAL A:   AKI likely 2/2 sepsis Mild hyponatremia P:   Trend BMPs Replete electrolytes PRN   GASTROINTESTINAL A:  No issues P:   NPO PPI  HEMATOLOGIC A:   Factor V Leiden with h/o PE Neutropenia- unclear etiology, chronic P:  Neutopenic precautions Heparin DVT ppx if CT head neg for cerebral hemorrhage Trend CBC Hold coumadin Heparin DVT PPx if head CT neg  INFECTIOUS A:   Community acquired pneumonia Sepsis P:   BCx2 7/8>> UA 7/9  Sputum 7/9 >> Sputum gram stain 7/9 >> Abx:   Ceftriaxone 7/8>> Azithromycin 7/8>> Levaquin 7/8 >>   ENDOCRINE A:   T2 DM P:   SSI Cont home detemir q4 CBG  NEUROLOGIC A:   AMS after fall Sedated  P:   RASS goal: 0 Follow cognition status Desired RAS -1  Hold sedating meds  Head Trauma A: Fall from bed with subsequent head trauma P: CT head/neck   FAMILY  - Update: no family available at this time  - Inter-disciplinary family meet or Palliative Care meeting  due by:  04/09/2015      Alyssa A. Kennon Rounds MD, MS Family Medicine Resident PGY-1 Pager [phone]   04/04/2015, 2:34 AM  STAFF NOTE: I have personally seen and evaluated the patient and reviewed the available data. I have discussed the patient with the housestaff officer or NP. I agree with the resident's note as documented above.  Briefly: 64 y/o man with sepsis of pulmonary origin c/b cardiac arrest.  The patient is critically ill with multiple organ systems failure and requires high complexity  decision making for assessment and support, frequent evaluation and titration of therapies, application of advanced monitoring technologies and extensive interpretation of multiple databases. Critical Care Time devoted to patient care services described in this note is 35 minutes.  Jamie KatoAaron Delara Shepheard, MD

## 2015-04-04 NOTE — Procedures (Signed)
Pt transported from 2M07 to CT then back to 2M07 by pt RN, RT and transporter without complications.

## 2015-04-04 NOTE — Progress Notes (Signed)
Notified Dr. Sung AmabileSimonds that th Tmax was 102.5 despite tylenol and ice packs. MD ok with temp as long as there are no problems associated with the fever. Will continue supportive treatments.

## 2015-04-04 NOTE — Progress Notes (Signed)
Patient's post fall assessment completed. Please see flowsheet.

## 2015-04-04 NOTE — Code Documentation (Signed)
  Patient Name: Adam HumphreyJerry L [name]   MRN: [id number]   Date of Birth/ Sex: 07-Aug-1951 , male      Admission Date: 04/15/2015  Attending Provider: Leatha Gildingostin M Gherghe, MD  Primary Diagnosis: sepsis   Indication: Pt was in his usual state of health until this AM, when he was noted to be face down on the floor, not breathing . Code blue was subsequently called. At the time of arrival on scene, ACLS protocol was underway.   Technical Description:  - CPR performance duration:  5  minutes  - Was defibrillation or cardioversion used? No   - Was external pacer placed? Yes  - Was patient intubated pre/post CPR? Yes   Medications Administered: Y = Yes; Blank = No Amiodarone    Atropine    Calcium    Epinephrine    Lidocaine    Magnesium    Norepinephrine    Phenylephrine    Sodium bicarbonate    Vasopressin     Post CPR evaluation:  - Final Status - Was patient successfully resuscitated ? Yes - What is current rhythm? Sinus tach - What is current hemodynamic status? stable  Miscellaneous Information:  - Labs sent, including: CBC, BMP, lactate, aBG, CXR, CT head  - Primary team notified?  Yes  - Family Notified? No  - Additional notes/ transfer status: Transfer to ICU     Jana HalfNicholas A Tajh Livsey, MD  04/04/2015, 1:54 AM

## 2015-04-04 NOTE — Progress Notes (Signed)
Subjective/Objective Found unresponsive face down in room - 5W14. Code Blue called. Patient received CPR, intubated. Did not require defib or ACLS medications.    Scheduled Meds: . atorvastatin  40 mg Oral q1800  . azithromycin  500 mg Intravenous Q24H  . cefTRIAXone (ROCEPHIN)  IV  1 g Intravenous Q24H  . chlorthalidone  25 mg Oral q morning - 10a  . citalopram  20 mg Oral Daily  . fluticasone  2 spray Each Nare Daily  . gabapentin  300 mg Oral TID  . insulin aspart  0-20 Units Subcutaneous TID WC  . insulin aspart  0-5 Units Subcutaneous QHS  . insulin aspart  6 Units Subcutaneous TID WC  . insulin detemir  50 Units Subcutaneous BID  . irbesartan  37.5 mg Oral Daily  . loratadine  10 mg Oral Daily  . metoprolol succinate  25 mg Oral Daily  . multivitamin with minerals  1 tablet Oral Daily  . pantoprazole  40 mg Oral Daily  . prasugrel  5 mg Oral Daily  . warfarin  5 mg Oral ONCE-1800  . Warfarin - Pharmacist Dosing Inpatient   Does not apply q1800   Continuous Infusions: . sodium chloride 75 mL/hr at December 26, 2014 1545   PRN Meds:acetaminophen, levalbuterol  Vital signs in last 24 hours: Temp:  [98.1 F (36.7 C)-102.5 F (39.2 C)] 100.5 F (38.1 C) (07/08 2127) Pulse Rate:  [68-116] 68 (07/09 0154) Resp:  [10-46] 19 (07/08 2127) BP: (113-153)/(52-97) 135/52 mmHg (07/09 0154) SpO2:  [91 %-99 %] 92 % (07/08 2127) Weight:  [122.925 kg (271 lb)-125.873 kg (277 lb 8 oz)] 125.873 kg (277 lb 8 oz) (07/08 1642)  Intake/Output last 3 shifts: I/O last 3 completed shifts: In: 578 [P.O.:358; I.V.:220] Out: 200 [Urine:200] Intake/Output this shift:    Problem Assessment/Plan  Patient resuscitated, intubated and transferred to 46M- 07. CT head and labs ordered. CCM has assumed care/medical management.

## 2015-04-04 NOTE — Progress Notes (Signed)
PULMONARY / CRITICAL CARE MEDICINE   Name: Adam HumphreyJerry L Vitanza MRN: [id number] DOB: 02-Jun-1951    ADMISSION DATE:  04/10/2015 CONSULTATION DATE:  04/04/2015  REFERRING MD : Triad  CHIEF COMPLAINT:  AMS  INITIAL PRESENTATION:  64 y/o admitted for community acquired pneumonia was found down in hospital room with bradycardia and apnea. Intubated and PCCM assumed care. PMH also includes OSA, DM 2, PE, factor 5 Leiden  MAJOR EVENTS/TEST RESULTS: 7/08 [facility] 7/09 early AM: found by RN staf on floor, face down, bradycardic, apneic. Code blue called. Brief chest compressions. No meds. Intubated. Transferred to ICU.PCCM assumed care 7/09 CT head/neck: No evidence of intracranial injury or facial fracture. Left neck contusion without evidence of cervical spine injury  INDWELLING DEVICES:: ETT 7/09 >>   MICRO DATA: Blood 7/08 >>  Resp 7/09 >>   PCT 7/09: 0.74  ANTIMICROBIALS:  Ceftriaxone 7/08 >>  Azithro 7/08 >>    SUBJ: RASS -3, not F/C, cervical collar in place  VITAL SIGNS: Temp:  [98.1 F (36.7 C)-102.5 F (39.2 C)] 101.9 F (38.8 C) (07/09 1300) Pulse Rate:  [68-138] 97 (07/09 1300) Resp:  [15-46] 19 (07/09 1300) BP: (88-191)/(35-107) 122/57 mmHg (07/09 1300) SpO2:  [92 %-100 %] 98 % (07/09 1300) FiO2 (%):  [40 %-100 %] 40 % (07/09 1245) Weight:  [125.2 kg (276 lb 0.3 oz)-125.873 kg (277 lb 8 oz)] 125.2 kg (276 lb 0.3 oz) (07/09 0415) HEMODYNAMICS:   VENTILATOR SETTINGS: Vent Mode:  [-] PRVC FiO2 (%):  [40 %-100 %] 40 % Set Rate:  [12 bmp-20 bmp] 12 bmp Vt Set:  [500 mL-600 mL] 600 mL PEEP:  [5 cmH20] 5 cmH20 Plateau Pressure:  [20 cmH20-26 cmH20] 26 cmH20 INTAKE / OUTPUT:  Intake/Output Summary (Last 24 hours) at 04/04/15 1347 Last data filed at 04/04/15 1300  Gross per 24 hour  Intake 2075.16 ml  Output   1775 ml  Net 300.16 ml    PHYSICAL EXAMINATION: General:  NAD, RASS -3, not F/C Neuro:  MAEs, no focal deficits HEENT:  nasal contusion, cervical collar Cardiovascular:  Reg, no M Lungs: few rhonchi, no wheezes Abdomen:  Obese, sft, +BS Ext: no edema, warm  LABS:  CBC  Recent Labs Lab 04/02/2015 0940 04/15/2015 1335 04/04/15 0155  WBC 1.3 Repeated and verified X2.* 1.1* 4.3  HGB 12.1* 12.3* 11.7*  HCT 35.5* 36.3* 35.9*  PLT 213.0 176 219   Coag's  Recent Labs Lab 04/09/2015 1335 04/04/15 0155  INR 1.51* 1.73*   BMET  Recent Labs Lab 03/28/2015 1335 04/04/15 0155 04/04/15 0539  NA 135 133* 134*  K 4.0 3.7 4.0  CL 103 101 102  CO2 24 16* 21*  BUN 19 16 18   CREATININE 1.23 1.31* 1.38*  GLUCOSE 216* 298* 327*   Electrolytes  Recent Labs Lab 03/27/2015 1335 04/04/15 0155 04/04/15 0539  CALCIUM 9.6 9.2 8.5*   Sepsis Markers  Recent Labs Lab 04/01/2015 1346 04/25/2015 1558 03/28/2015 1924 04/04/15 0155  LATICACIDVEN 2.19* 0.94 1.8  --   PROCALCITON  --   --   --  0.74   ABG  Recent Labs Lab 04/04/15 0205 04/04/15 0308 04/04/15 0522  PHART 7.018* 7.262* 7.295*  PCO2ART 62.3* 43.7 38.1  PO2ART 161* 248.0* 79.0*   Liver Enzymes  Recent Labs Lab 04/04/2015 1335 04/04/15 0539  AST 25 32  ALT 23 28  ALKPHOS 118 106  BILITOT 1.0 0.8  ALBUMIN 3.3* 2.6*   Cardiac  Enzymes  Recent Labs Lab 04/04/15 1131  TROPONINI 0.13*   Glucose  Recent Labs Lab 04/04/15 0557 04/04/15 0701 04/04/15 0745 04/04/15 0914 04/04/15 1022 04/04/15 1114  GLUCAP 302* 295* 304* 263* 251* 239*    CXR: LLL atx/effusion/infiltrate    ASSESSMENT / PLAN:  PULMONARY A: CAP, NOS Acute respiratory failure - unclear precipitating event OSA - CPAP prior to admission P:   Cont full vent support - settings reviewed and/or adjusted Cont vent bundle Daily SBT if/when meets criteria  CARDIOVASCULAR A:  Hypertension, controlled S/p bradycardic arrest - suspect primary resp event P:  Monitor Check EKG Check cardiac markers MAP goal > 65 mmHg Holding most home antihypertensive  meds Metoprolol adjusted to per tube  RENAL A:   Very mild AKI Very mild hyponatremia P:   Monitor BMET intermittently Monitor I/Os Correct electrolytes as indicated  GASTROINTESTINAL A:   No issues P:   SUP: IV famotidine Consider TFs 7/10 if not extubated  HEMATOLOGIC A:   Factor V Leiden with h/o PE Neutropenia, resolved P:  DVT px: warfarin per pharmacy Monitor CBC intermittently Transfuse per usual ICU guidelines  INFECTIOUS A:   CAP, NOS Severe sepsis P:   Monitor temp, WBC count Micro and abx as above  ENDOCRINE A:   DM 2, poorly controlled P:   Cont insulin gtt  NEUROLOGIC A:   Acute encephalopathy Neck contusion by CT scan - no spine injury noted P:   RASS goal: -1, -2 PAD protocol - propofol, PRN fentanyl Daily WUA   FAMILY  - Update: wife and son updated @ bedside     Alyssa A. Kennon Rounds MD, MS Family Medicine Resident PGY-1 Pager [phone]   04/04/2015, 1:47 PM  STAFF NOTE: I have personally seen and evaluated the patient and reviewed the available data. I have discussed the patient with the housestaff officer, Dr Kennon Rounds. I agree with the resident's note as documented above and modified in detail by me  CCM X 30 mins   Billy Fischer, MD ; [facility] service Mobile [phone].  After 5:30 PM or weekends, call [phone]

## 2015-04-04 NOTE — Progress Notes (Signed)
ANTICOAGULATION CONSULT NOTE - FOLLOW UP  Pharmacy Consult:  Coumadin Indication: factor V Leiden  Allergies  Allergen Reactions  . Piroxicam Other (See Comments)    Blistering on hands  . Rofecoxib Other (See Comments)    REACTION: Reaction not known    Patient Measurements: Height: 5\' 11"  (180.3 cm) Weight: 276 lb 0.3 oz (125.2 kg) IBW/kg (Calculated) : 75.3  Vital Signs: Temp: 100.4 F (38 C) (07/09 0800) Temp Source: Core (Comment) (07/09 0800) BP: 150/66 mmHg (07/09 0900) Pulse Rate: 107 (07/09 0900)  Labs:  Recent Labs  03/29/2015 0940 04/18/2015 1335 04/04/15 0155 04/04/15 0539  HGB 12.1* 12.3* 11.7*  --   HCT 35.5* 36.3* 35.9*  --   PLT 213.0 176 219  --   LABPROT  --  18.3* 20.3*  --   INR  --  1.51* 1.73*  --   CREATININE 1.15 1.23 1.31* 1.38*    Estimated Creatinine Clearance: 75.8 mL/min (by C-G formula based on Cr of 1.38).    Assessment: [age] YOM with history of factor V Leiden and PE presented with cough and fever from PCP.  Pharmacy is consulted to manage warfarin.  Patient's INR is sub-therapeutic and has trended down.  An extra 5mg  of Coumadin was ordered yesterday and dose was not given due to misunderstanding.  Patient's antibiotics for CAP was not given either.  PTA Warfarin Dose: 7.5mg  Wed and 5mg  AODs with last dose today   Goal of Therapy:  INR 2-3    Plan:  - Coumadin 7.5mg  PO today - Continue heparin SQ until INR is therapeutic - Daily PT / INR - Verify home ARB when extubated - Watch CBGs    Chritopher Coster D. Laney Potashang, PharmD, BCPS Pager:  [phone] 04/04/2015, 9:53 AM

## 2015-04-05 ENCOUNTER — Inpatient Hospital Stay (HOSPITAL_COMMUNITY): Payer: BLUE CROSS/BLUE SHIELD

## 2015-04-05 LAB — PROTIME-INR
INR: 1.72 — ABNORMAL HIGH (ref 0.00–1.49)
Prothrombin Time: 20.1 seconds — ABNORMAL HIGH (ref 11.6–15.2)

## 2015-04-05 LAB — COMPREHENSIVE METABOLIC PANEL
ALK PHOS: 118 U/L (ref 38–126)
ALT: 25 U/L (ref 17–63)
AST: 25 U/L (ref 15–41)
Albumin: 2.7 g/dL — ABNORMAL LOW (ref 3.5–5.0)
Anion gap: 10 (ref 5–15)
BUN: 16 mg/dL (ref 6–20)
CALCIUM: 8.7 mg/dL — AB (ref 8.9–10.3)
CO2: 19 mmol/L — AB (ref 22–32)
Chloride: 107 mmol/L (ref 101–111)
Creatinine, Ser: 1.34 mg/dL — ABNORMAL HIGH (ref 0.61–1.24)
GFR calc non Af Amer: 56 mL/min — ABNORMAL LOW (ref >60)
GLUCOSE: 216 mg/dL — AB (ref 65–99)
Potassium: 4.1 mmol/L (ref 3.5–5.1)
Sodium: 136 mmol/L (ref 135–145)
Total Bilirubin: 0.5 mg/dL (ref 0.3–1.2)
Total Protein: 7.2 g/dL (ref 6.5–8.1)

## 2015-04-05 LAB — TROPONIN I: Troponin I: 0.07 ng/mL — ABNORMAL HIGH (ref <0.031)

## 2015-04-05 LAB — GLUCOSE, CAPILLARY
GLUCOSE-CAPILLARY: 166 mg/dL — AB (ref 65–99)
Glucose-Capillary: 179 mg/dL — ABNORMAL HIGH (ref 65–99)
Glucose-Capillary: 238 mg/dL — ABNORMAL HIGH (ref 65–99)
Glucose-Capillary: 236 mg/dL — ABNORMAL HIGH (ref 65–99)

## 2015-04-05 LAB — CBC
HEMATOCRIT: 35.7 % — AB (ref 39.0–52.0)
Hemoglobin: 11.8 g/dL — ABNORMAL LOW (ref 13.0–17.0)
MCH: 27.6 pg (ref 26.0–34.0)
MCHC: 33.1 g/dL (ref 30.0–36.0)
MCV: 83.4 fL (ref 78.0–100.0)
Platelets: 187 10*3/uL (ref 150–400)
RBC: 4.28 MIL/uL (ref 4.22–5.81)
RDW: 15.7 % — ABNORMAL HIGH (ref 11.5–15.5)
WBC: 1.1 10*3/uL — AB (ref 4.0–10.5)

## 2015-04-05 MED ORDER — LEVALBUTEROL HCL 0.63 MG/3ML IN NEBU
0.6300 mg | INHALATION_SOLUTION | Freq: Four times a day (QID) | RESPIRATORY_TRACT | Status: DC
  Administered 2015-04-05 – 2015-04-16 (×44): 0.63 mg via RESPIRATORY_TRACT
  Filled 2015-04-05 (×79): qty 3

## 2015-04-05 MED ORDER — PANTOPRAZOLE SODIUM 40 MG PO PACK
40.0000 mg | PACK | Freq: Every day | ORAL | Status: DC
  Administered 2015-04-05 – 2015-04-15 (×11): 40 mg
  Filled 2015-04-05 (×12): qty 20

## 2015-04-05 MED ORDER — ATORVASTATIN CALCIUM 40 MG PO TABS
40.0000 mg | ORAL_TABLET | Freq: Every day | ORAL | Status: DC
  Administered 2015-04-05 – 2015-04-15 (×11): 40 mg
  Filled 2015-04-05 (×12): qty 1

## 2015-04-05 MED ORDER — DEXTROSE 5 % IV SOLN
2.0000 g | Freq: Three times a day (TID) | INTRAVENOUS | Status: DC
  Administered 2015-04-05 – 2015-04-09 (×12): 2 g via INTRAVENOUS
  Filled 2015-04-05 (×15): qty 2

## 2015-04-05 MED ORDER — FREE WATER
100.0000 mL | Freq: Three times a day (TID) | Status: DC
  Administered 2015-04-05 – 2015-04-11 (×18): 100 mL

## 2015-04-05 MED ORDER — DEXTROSE 5 % IV SOLN
500.0000 mg | INTRAVENOUS | Status: DC
  Administered 2015-04-06 – 2015-04-08 (×3): 500 mg via INTRAVENOUS
  Filled 2015-04-05 (×4): qty 500

## 2015-04-05 MED ORDER — INSULIN GLARGINE 100 UNIT/ML ~~LOC~~ SOLN
30.0000 [IU] | Freq: Every day | SUBCUTANEOUS | Status: DC
  Filled 2015-04-05: qty 0.3

## 2015-04-05 MED ORDER — PRO-STAT SUGAR FREE PO LIQD
30.0000 mL | Freq: Two times a day (BID) | ORAL | Status: DC
  Administered 2015-04-05 – 2015-04-06 (×3): 30 mL
  Filled 2015-04-05 (×4): qty 30

## 2015-04-05 MED ORDER — WARFARIN SODIUM 7.5 MG PO TABS
7.5000 mg | ORAL_TABLET | Freq: Once | ORAL | Status: AC
  Administered 2015-04-05: 7.5 mg via ORAL
  Filled 2015-04-05 (×2): qty 1

## 2015-04-05 MED ORDER — VITAL HIGH PROTEIN PO LIQD
1000.0000 mL | ORAL | Status: DC
  Administered 2015-04-05: 12:00:00
  Filled 2015-04-05 (×4): qty 1000

## 2015-04-05 MED ORDER — CEFTAZIDIME 1 G IJ SOLR
1.0000 g | Freq: Three times a day (TID) | INTRAMUSCULAR | Status: DC
  Filled 2015-04-05 (×2): qty 1

## 2015-04-05 MED ORDER — VANCOMYCIN HCL 10 G IV SOLR
2500.0000 mg | Freq: Once | INTRAVENOUS | Status: AC
  Administered 2015-04-05: 2500 mg via INTRAVENOUS
  Filled 2015-04-05: qty 2500

## 2015-04-05 MED ORDER — VANCOMYCIN HCL IN DEXTROSE 1-5 GM/200ML-% IV SOLN
1000.0000 mg | Freq: Two times a day (BID) | INTRAVENOUS | Status: DC
  Administered 2015-04-06 – 2015-04-07 (×3): 1000 mg via INTRAVENOUS
  Filled 2015-04-05 (×4): qty 200

## 2015-04-05 NOTE — Progress Notes (Signed)
PULMONARY / CRITICAL CARE MEDICINE   Name: [name] MRN: [id number] DOB: 1951/04/15    ADMISSION DATE:  04/05/2015 CONSULTATION DATE:  04/04/2015  REFERRING MD : Triad  CHIEF COMPLAINT:  AMS  INITIAL PRESENTATION:  64 y/o admitted for community acquired pneumonia was found down in hospital room with bradycardia and apnea. Intubated and PCCM assumed care. PMH also includes OSA, DM 2, PE, factor 5 Leiden  MAJOR EVENTS/TEST RESULTS: 7/08 Admitted to telemetry by [facility] with dx of CAP 7/09 early AM: found by RN staf on floor, face down, bradycardic, apneic. Code blue called. Brief chest compressions. No meds. Intubated. Transferred to ICU.PCCM assumed care 7/09 CT head/neck: No evidence of intracranial injury or facial fracture. Left neck contusion without evidence of cervical spine injury 7/10 Persistent fever. Leukopenic. Abx expanded. Transitioned off insulin gtt to Lantus and SSI  INDWELLING DEVICES:: ETT 7/09 >>   MICRO DATA: Blood 7/08 >>  Resp 7/09 >>   PCT 7/09: 0.74,   ANTIMICROBIALS:  Ceftriaxone 7/08 >> 7/10 Azithro 7/08 >> 7/10 Vanc 7/10 >>  Ceftaz 7/10 >>    SUBJ: RASS -1, + F/C, cervical collar in place. Failed SBT  Fever persists  VITAL SIGNS: Temp:  [99.3 F (37.4 C)-103.1 F (39.5 C)] 101.3 F (38.5 C) (07/10 1500) Pulse Rate:  [87-112] 104 (07/10 1500) Resp:  [14-24] 21 (07/10 1500) BP: (90-173)/(42-93) 131/68 mmHg (07/10 1500) SpO2:  [96 %-100 %] 100 % (07/10 1500) FiO2 (%):  [40 %] 40 % (07/10 1500) HEMODYNAMICS:   VENTILATOR SETTINGS: Vent Mode:  [-] PRVC FiO2 (%):  [40 %] 40 % Set Rate:  [12 bmp] 12 bmp Vt Set:  [600 mL] 600 mL PEEP:  [5 cmH20] 5 cmH20 Plateau Pressure:  [18 cmH20-29 cmH20] 29 cmH20 INTAKE / OUTPUT:  Intake/Output Summary (Last 24 hours) at 04/05/15 1635 Last data filed at 04/05/15 1500  Gross per 24 hour  Intake 4742.89 ml  Output    950 ml  Net 3792.89 ml    PHYSICAL EXAMINATION: General:  NAD, RASS -1, +  F/C Neuro:  MAEs, no focal deficits HEENT: nasal contusion, cervical collar Cardiovascular:  Reg, no M Lungs: few rhonchi, no wheezes Abdomen:  Obese, sft, +BS Ext: no edema, warm  LABS:  CBC  Recent Labs Lab 04/04/2015 1335 04/04/15 0155 04/05/15 0335  WBC 1.1* 4.3 1.1*  HGB 12.3* 11.7* 11.8*  HCT 36.3* 35.9* 35.7*  PLT 176 219 187   Coag's  Recent Labs Lab 04/02/2015 1335 04/04/15 0155 04/05/15 0335  INR 1.51* 1.73* 1.72*   BMET  Recent Labs Lab 04/04/15 0155 04/04/15 0539 04/05/15 0335  NA 133* 134* 136  K 3.7 4.0 4.1  CL 101 102 107  CO2 16* 21* 19*  BUN CREATININE 1.31* 1.38* 1.34*  GLUCOSE 298* 327* 216*   Electrolytes  Recent Labs Lab 04/04/15 0155 04/04/15 0539 04/05/15 0335  CALCIUM 9.2 8.5* 8.7*   Sepsis Markers  Recent Labs Lab 04/07/2015 1346 03/29/2015 1558 04/07/2015 1924 04/04/15 0155  LATICACIDVEN 2.19* 0.94 1.8  --   PROCALCITON  --   --   --  0.74   ABG  Recent Labs Lab 04/04/15 0205 04/04/15 0308 04/04/15 0522  PHART 7.018* 7.262* 7.295*  PCO2ART 62.3* 43.7 38.1  PO2ART 161* 248.0* 79.0*   Liver Enzymes  Recent Labs Lab 04/15/2015 1335 04/04/15 0539 04/05/15 0335  AST 25 32 25  ALT ALKPHOS 118 106 118  BILITOT 1.0  0.8 0.5  ALBUMIN 3.3* 2.6* 2.7*   Cardiac Enzymes  Recent Labs Lab 04/04/15 1131 04/05/15 0335  TROPONINI 0.13* 0.07*   Glucose  Recent Labs Lab 04/04/15 1412 04/04/15 1521 04/04/15 1619 04/04/15 1954 04/04/15 2343 04/05/15 0345  GLUCAP 183* 165* 137* 165* 166* 179*    CXR: NSC LLL atx/effusion/infiltrate  ASSESSMENT / PLAN:  PULMONARY A: Acute respiratory failure - unclear precipitating event OSA - CPAP prior to admission Suspected CAP, NOS P:   Cont full vent support - settings reviewed and/or adjusted Cont vent bundle Daily SBT if/when meets criteria  CARDIOVASCULAR A:  Hypertension, controlled S/p bradycardic arrest - suspect primary resp  event  Cardiac markers negative P:  Monitor MAP goal > 65 mmHg Holding most home antihypertensive meds Cont metoprolol per tube  RENAL A:   Very mild AKI Very mild hyponatremia P:   Monitor BMET intermittently Monitor I/Os Correct electrolytes as indicated  GASTROINTESTINAL A:   No issues P:   SUP: enteral pantoprazole Initiate TFs 7/10  HEMATOLOGIC A:   Factor V Leiden with h/o PE Neutropenia, recurrent  Has documented history of this dating to 2013 P:  DVT px: warfarin per pharmacy Monitor CBC intermittently Transfuse per usual ICU guidelines  INFECTIOUS A:   CAP, NOS Severe sepsis P:   Monitor temp, WBC count Micro and abx as above  ENDOCRINE A:   DM 2, poorly controlled P:   Cont Lantus Cont SSI  NEUROLOGIC A:   Acute encephalopathy, mild Neck contusion by CT scan - no spine or cord injury noted P:   RASS goal: -1, -2 PAD protocol - propofol, PRN fentanyl Daily WUA Cont C collar Will need clinical clearance of C spine after extubation   FAMILY  - Update: wife and son updated @ bedside   CCM X 35 mins   Billy Fischeravid Masahiro Iglesia, MD ; [facility] service Mobile [phone].  After 5:30 PM or weekends, call [phone]

## 2015-04-05 NOTE — Progress Notes (Addendum)
ANTICOAGULATION CONSULT NOTE - FOLLOW UP  Pharmacy Consult:  Coumadin Indication: factor V Leiden  Allergies  Allergen Reactions  . Piroxicam Other (See Comments)    Blistering on hands  . Rofecoxib Other (See Comments)    REACTION: Reaction not known    Patient Measurements: Height: 5\' 11"  (180.3 cm) Weight: 276 lb 0.3 oz (125.2 kg) IBW/kg (Calculated) : 75.3  Vital Signs: Temp: 101 F (38.3 C) (07/10 0600) Temp Source: Core (Comment) (07/10 0400) BP: 107/53 mmHg (07/10 0600) Pulse Rate: 100 (07/10 0600)  Labs:  Recent Labs  10-15-14 1335 04/04/15 0155 04/04/15 0539 04/04/15 1131 04/05/15 0335  HGB 12.3* 11.7*  --   --  11.8*  HCT 36.3* 35.9*  --   --  35.7*  PLT 176 219  --   --  187  LABPROT 18.3* 20.3*  --   --  20.1*  INR 1.51* 1.73*  --   --  1.72*  CREATININE 1.23 1.31* 1.38*  --  1.34*  TROPONINI  --   --   --  0.13* 0.07*    Estimated Creatinine Clearance: 78 mL/min (by C-G formula based on Cr of 1.34).    Assessment: [age] YOM with history of factor V Leiden and PE presented with cough and fever from PCP.  Pharmacy is consulted to manage warfarin.  Patient's INR is sub-therapeutic and has trended down.  An extra 5mg  of Coumadin was ordered yesterday and dose was not given due to misunderstanding.  Patient's antibiotics for CAP was not given either.  PTA Warfarin Dose: 7.5mg  Wed and 5mg  AODs with last dose today   Goal of Therapy:  INR 2-3    Plan:  - Repeat Coumadin 7.5mg  PO today - Daily PT / INR - Verify home ARB when extubated - F/U TG in AM    Zalma Channing D. Laney Potashang, PharmD, BCPS Pager:  [phone] 04/05/2015, 9:59 AM     =======================   Addendum - add vanc and ceftazidime for febrile neutropenia - improving renal function  CTX 7/9 >> 7/10 Azith 7/9 >> 7/10 Vanc 7/10 >> Fortaz 7/10 >>  7/8 BCx x2 - NGTD 7/9 resp cx -    Goal of Therapy: Vanc trough 15-20 mcg/mL   Plan: - Vanc 2500mg  IV x 1, then 1gm IV Q12H -  Change Fortaz to  2gm IV Q8H - Monitor renal fxn, clinical progress, vanc trough at Css    Charlena Haub D. Laney Potashang, PharmD, BCPS Pager:  [phone] 04/05/2015, 11:38 AM

## 2015-04-05 NOTE — Progress Notes (Signed)
I saw pt 04/05/2015 in office with 24 h illness and clear cut LLL post seg lobar pna on cxr so the original problem is CAP and  needs to complete empirical coverage for this.  Note he did receive one dose doxy before cxr/labs were back and this may have affected culture results.  Will add back zithromax for atypical organisms not covered on vanc/fortaz.   Sandrea HughsMichael Kaisy Severino, MD Pulmonary and Critical Care Medicine [facility] Cell [phone] After 5:30 PM or weekends, call [phone]

## 2015-04-06 ENCOUNTER — Inpatient Hospital Stay (HOSPITAL_COMMUNITY): Payer: BLUE CROSS/BLUE SHIELD

## 2015-04-06 DIAGNOSIS — I469 Cardiac arrest, cause unspecified: Secondary | ICD-10-CM

## 2015-04-06 LAB — CBC
HCT: 29.8 % — ABNORMAL LOW (ref 39.0–52.0)
HEMOGLOBIN: 9.9 g/dL — AB (ref 13.0–17.0)
MCH: 27.3 pg (ref 26.0–34.0)
MCHC: 33.2 g/dL (ref 30.0–36.0)
MCV: 82.3 fL (ref 78.0–100.0)
PLATELETS: 197 10*3/uL (ref 150–400)
RBC: 3.62 MIL/uL — ABNORMAL LOW (ref 4.22–5.81)
RDW: 16.2 % — ABNORMAL HIGH (ref 11.5–15.5)
WBC: 1.1 10*3/uL — AB (ref 4.0–10.5)

## 2015-04-06 LAB — CULTURE, RESPIRATORY W GRAM STAIN: Gram Stain: NONE SEEN

## 2015-04-06 LAB — COMPREHENSIVE METABOLIC PANEL
ALT: 23 U/L (ref 17–63)
AST: 24 U/L (ref 15–41)
Albumin: 2 g/dL — ABNORMAL LOW (ref 3.5–5.0)
Alkaline Phosphatase: 118 U/L (ref 38–126)
Anion gap: 10 (ref 5–15)
BUN: 25 mg/dL — AB (ref 6–20)
CO2: 19 mmol/L — ABNORMAL LOW (ref 22–32)
Calcium: 8.4 mg/dL — ABNORMAL LOW (ref 8.9–10.3)
Chloride: 105 mmol/L (ref 101–111)
Creatinine, Ser: 1.64 mg/dL — ABNORMAL HIGH (ref 0.61–1.24)
GFR, EST AFRICAN AMERICAN: 51 mL/min — AB (ref >60)
GFR, EST NON AFRICAN AMERICAN: 44 mL/min — AB (ref >60)
Glucose, Bld: 244 mg/dL — ABNORMAL HIGH (ref 65–99)
Potassium: 3.7 mmol/L (ref 3.5–5.1)
Sodium: 134 mmol/L — ABNORMAL LOW (ref 135–145)
TOTAL PROTEIN: 6.4 g/dL — AB (ref 6.5–8.1)
Total Bilirubin: 0.3 mg/dL (ref 0.3–1.2)

## 2015-04-06 LAB — PROTIME-INR
INR: 3.19 — AB (ref 0.00–1.49)
PROTHROMBIN TIME: 32 s — AB (ref 11.6–15.2)

## 2015-04-06 LAB — GLUCOSE, CAPILLARY
GLUCOSE-CAPILLARY: 233 mg/dL — AB (ref 65–99)
GLUCOSE-CAPILLARY: 227 mg/dL — AB (ref 65–99)
GLUCOSE-CAPILLARY: 296 mg/dL — AB (ref 65–99)
Glucose-Capillary: 243 mg/dL — ABNORMAL HIGH (ref 65–99)
Glucose-Capillary: 245 mg/dL — ABNORMAL HIGH (ref 65–99)
Glucose-Capillary: 262 mg/dL — ABNORMAL HIGH (ref 65–99)
Glucose-Capillary: 272 mg/dL — ABNORMAL HIGH (ref 65–99)
Glucose-Capillary: 302 mg/dL — ABNORMAL HIGH (ref 65–99)

## 2015-04-06 LAB — CULTURE, RESPIRATORY

## 2015-04-06 LAB — TRIGLYCERIDES: TRIGLYCERIDES: 188 mg/dL — AB (ref <150)

## 2015-04-06 LAB — PROCALCITONIN: Procalcitonin: 6.53 ng/mL

## 2015-04-06 LAB — CLOSTRIDIUM DIFFICILE BY PCR: Toxigenic C. Difficile by PCR: NEGATIVE

## 2015-04-06 MED ORDER — FENTANYL CITRATE (PF) 100 MCG/2ML IJ SOLN
50.0000 ug | INTRAMUSCULAR | Status: DC | PRN
  Administered 2015-04-07 – 2015-04-09 (×8): 50 ug via INTRAVENOUS
  Filled 2015-04-06 (×7): qty 2

## 2015-04-06 MED ORDER — VITAL HIGH PROTEIN PO LIQD
1000.0000 mL | ORAL | Status: DC
  Filled 2015-04-06 (×3): qty 1000

## 2015-04-06 MED ORDER — PRO-STAT SUGAR FREE PO LIQD
60.0000 mL | Freq: Every day | ORAL | Status: DC
Start: 2015-04-06 — End: 2015-04-16
  Administered 2015-04-06 – 2015-04-16 (×50): 60 mL
  Filled 2015-04-06 (×53): qty 60

## 2015-04-06 MED ORDER — INSULIN GLARGINE 100 UNIT/ML ~~LOC~~ SOLN
30.0000 [IU] | Freq: Two times a day (BID) | SUBCUTANEOUS | Status: DC
  Administered 2015-04-06 – 2015-04-07 (×3): 30 [IU] via SUBCUTANEOUS
  Filled 2015-04-06 (×4): qty 0.3

## 2015-04-06 NOTE — Progress Notes (Signed)
Inpatient Diabetes Program Recommendations  AACE/ADA: New Consensus Statement on Inpatient Glycemic Control (2013)  Target Ranges:  Prepandial:   less than 140 mg/dL      Peak postprandial:   less than 180 mg/dL (1-2 hours)      Critically ill patients:  140 - 180 mg/dL   Results for [name], [name] (MRN [id number]) as of 04/06/2015 08:33  Ref. Range 04/05/2015 03:45 04/05/2015 16:16 04/05/2015 20:04 04/05/2015 23:51 04/06/2015 03:55  Glucose-Capillary Latest Ref Range: 65-99 mg/dL 829179 (H) 562238 (H) 130236 (H) 272 (H) 233 (H)   Diabetes history: Type 2 diabetes Outpatient Diabetes medications: Levemir 60 units bid, Novolog 44 units tid with meals Current orders for Inpatient glycemic control:  Levemir 30 units daily, Novolog moderate q 4 hours  Note that CBG's greater than goal.  Please consider increasing Levemir to 30 units bid.  If CBG's continue to be increased, will likely also need "Tube Feed Coverage" q 4 hours.    Will follow.  Thanks, Beryl MeagerJenny Avaya Mcjunkins, RN, BC-ADM Inpatient Diabetes Coordinator Pager [phone] (8a-5p)

## 2015-04-06 NOTE — Progress Notes (Signed)
PULMONARY / CRITICAL CARE MEDICINE   Name: [name] MRN: [id number] DOB: 13-Jan-1951    ADMISSION DATE:  Apr 23, 2015 CONSULTATION DATE:  04/04/2015  REFERRING MD : Triad  CHIEF COMPLAINT:  AMS  INITIAL PRESENTATION:  64 y/o admitted for community acquired pneumonia was found down in hospital room with bradycardia and apnea. Intubated and PCCM assumed care. PMH also includes OSA, DM 2, PE, factor 5 Leiden  MAJOR EVENTS/TEST RESULTS: 7/08 Admitted to telemetry by [facility] with dx of CAP 7/09 early AM: found by RN staf on floor, face down, bradycardic, apneic. Code blue called. Brief chest compressions. No meds. Intubated. Transferred to ICU.PCCM assumed care 7/09 CT head/neck: No evidence of intracranial injury or facial fracture. Left neck contusion without evidence of cervical spine injury 7/10 Persistent fever. Leukopenic. Abx expanded. Transitioned off insulin gtt to Lantus and SSI  INDWELLING DEVICES:: ETT 7/09 >>   MICRO DATA: Blood 7/08 >>  Resp 7/09 >>   PCT 7/09: 0.74,   ANTIMICROBIALS:  Ceftriaxone 7/08 >> 7/10 Azithro 7/08 >> 7/10>> restarted azithro 7/10>> Vanc 7/10 >>  Ceftaz 7/10 >>    SUBJ: Intubated, RASS -1 responds to voice follows some commands  VITAL SIGNS: Temp:  [99.3 F (37.4 C)-102.8 F (39.3 C)] 99.9 F (37.7 C) (07/11 0800) Pulse Rate:  [83-108] 108 (07/11 0800) Resp:  [13-27] 27 (07/11 0800) BP: (86-183)/(45-82) 183/82 mmHg (07/11 0800) SpO2:  [98 %-100 %] 99 % (07/11 0800) FiO2 (%):  [40 %] 40 % (07/11 0800) Weight:  [281 lb 8.4 oz (127.7 kg)] 281 lb 8.4 oz (127.7 kg) (07/11 0315) HEMODYNAMICS:   VENTILATOR SETTINGS: Vent Mode:  [-] PRVC FiO2 (%):  [40 %] 40 % Set Rate:  [12 bmp] 12 bmp Vt Set:  [600 mL] 600 mL PEEP:  [5 cmH20] 5 cmH20 Plateau Pressure:  [18 cmH20-29 cmH20] 28 cmH20 INTAKE / OUTPUT:  Intake/Output Summary (Last 24 hours) at 04/06/15 0844 Last data filed at 04/06/15 0800  Gross per 24 hour  Intake 2510.32 ml   Output   1350 ml  Net 1160.32 ml    PHYSICAL EXAMINATION: General:  NAD, RASS -1 Neuro:  No focal deficits HEENT: nasal contusion, cervical collar Cardiovascular:  RRR Lungs: few rhonchi, no wheezes Abdomen:  Obese, sft, +BS Ext: mild b/l LE edema, warm  LABS:  CBC  Recent Labs Lab 04/04/15 0155 04/05/15 0335 04/06/15 0646  WBC 4.3 1.1* 1.1*  HGB 11.7* 11.8* 9.9*  HCT 35.9* 35.7* 29.8*  PLT 219 187 197   Coag's  Recent Labs Lab 04/04/15 0155 04/05/15 0335 04/06/15 0646  INR 1.73* 1.72* 3.19*   BMET  Recent Labs Lab 04/04/15 0539 04/05/15 0335 04/06/15 0646  NA 134* 136 134*  K 4.0 4.1 3.7  CL 102 107 105  CO2 21* 19* 19*  BUN 18 16 25*  CREATININE 1.38* 1.34* 1.64*  GLUCOSE 327* 216* 244*   Electrolytes  Recent Labs Lab 04/04/15 0539 04/05/15 0335 04/06/15 0646  CALCIUM 8.5* 8.7* 8.4*   Sepsis Markers  Recent Labs Lab 04-23-2015 1346 2015-04-23 1558 04-23-15 1924 04/04/15 0155  LATICACIDVEN 2.19* 0.94 1.8  --   PROCALCITON  --   --   --  0.74   ABG  Recent Labs Lab 04/04/15 0205 04/04/15 0308 04/04/15 0522  PHART 7.018* 7.262* 7.295*  PCO2ART 62.3* 43.7 38.1  PO2ART 161* 248.0* 79.0*   Liver Enzymes  Recent Labs Lab 04/04/15 0539 04/05/15 0335 04/06/15 0646  AST 32 25 24  ALT  28 25 23   ALKPHOS 106 118 118  BILITOT 0.8 0.5 0.3  ALBUMIN 2.6* 2.7* 2.0*   Cardiac Enzymes  Recent Labs Lab 04/04/15 1131 04/05/15 0335  TROPONINI 0.13* 0.07*   Glucose  Recent Labs Lab 04/04/15 2343 04/05/15 0345 04/05/15 1616 04/05/15 2004 04/05/15 2351 04/06/15 0355  GLUCAP 166* 179* 238* 236* 272* 233*    CXR: NGT, ETT in good position, persistent low lung volumes, small left pleural effusion  ASSESSMENT / PLAN:  PULMONARY A: Acute respiratory failure - unclear precipitating event OSA - CPAP prior to admission Suspected CAP, NOS P:   Cont full vent support  Cont vent bundle Daily SBT if/when meets  criteria  CARDIOVASCULAR A:  Hypertension, controlled S/p bradycardic arrest - suspect primary resp event  Cardiac markers negative P:  Monitor MAP goal > 65 mmHg Holding most home antihypertensive meds Cont metoprolol per tube  RENAL A:   AKI Very mild hyponatremia P:   Monitor BMET intermittently Monitor I/Os Correct electrolytes as indicated  GASTROINTESTINAL A:   Loose stools with incontinence P:   SUP: enteral pantoprazole Initiate TFs 7/10 C diff pending  HEMATOLOGIC A:   Factor V Leiden with h/o PE Neutropenia, recurrent  Has documented history of this dating to 2013 P:  DVT px: warfarin per pharmacy Monitor CBC intermittently Transfuse per usual ICU guidelines  INFECTIOUS A:   CAP, NOS Severe sepsis P:   Monitor temp, WBC count Follow PCT Micro and abx as above  ENDOCRINE A:   DM 2, poorly controlled P:   Cont Lantus Cont SSI  NEUROLOGIC A:   Acute encephalopathy Neck contusion by CT scan - no spine or cord injury noted P:   RASS goal: -1, -2 PAD protocol - propofol, PRN fentanyl Daily WUA Cont C collar Will need clinical clearance of C spine after extubation   FAMILY  - Update: wife and son updated @ bedside 7/10     Alyssa A. Kennon RoundsHaney MD, MS Family Medicine Resident PGY-1 Pager [phone]

## 2015-04-06 NOTE — Progress Notes (Signed)
ANTICOAGULATION CONSULT NOTE - Initial Consult  Pharmacy Consult for Heparin Indication: Factor V Leiden, Hx of PE  Allergies  Allergen Reactions  . Piroxicam Other (See Comments)    Blistering on hands  . Rofecoxib Other (See Comments)    REACTION: Reaction not known    Patient Measurements: Height:  (180.3 cm) Weight: 281 lb 8.4 oz (127.7 kg) IBW/kg (Calculated) : 75.3 Heparin Dosing Weight: 103 kg  Vital Signs: Temp: 99.3 F (37.4 C) (07/11 1300) Temp Source: Core (Comment) (07/11 1300) BP: 120/57 mmHg (07/11 1300) Pulse Rate: 90 (07/11 1300)  Labs:  Recent Labs  04/04/15 0155 04/04/15 0539 04/04/15 1131 04/05/15 0335 04/06/15 0646  HGB 11.7*  --   --  11.8* 9.9*  HCT 35.9*  --   --  35.7* 29.8*  PLT 219  --   --  187 197  LABPROT 20.3*  --   --  20.1* 32.0*  INR 1.73*  --   --  1.72* 3.19*  CREATININE 1.31* 1.38*  --  1.34* 1.64*  TROPONINI  --   --  0.13* 0.07*  --     Estimated Creatinine Clearance: 64.4 mL/min (by C-G formula based on Cr of 1.64).   Medical History: Past Medical History  Diagnosis Date  . Hyperlipidemia   . SLEEP APNEA, OBSTRUCTIVE 01/16/2009  . ESOPHAGEAL STRICTURE 01/16/2009  . PSORIASIS     uses a cream  . PULMONARY EMBOLISM     takes Coumadin daily  . NEPHROLITHIASIS, HX OF 02/27/2009  . ANEMIA-UNSPECIFIED 04/28/2010  . CAD (coronary artery disease)     a. s/p DES to RCA x2 in 2005. b. s/p DES to CFX 2009. c. s/p DES PTCA of RCA for in stent restenosis 03/2010. d. s/p Promus DES x2 to RCA 07/16/10(recurrent pain with abnormal Myoview 06/2010; residual nonobstructive disease at cath.  . Leukopenia 11/11/2011  . DEPRESSION   . HYPERTENSION   . GERD   . PONV (postoperative nausea and vomiting)   . Peripheral edema   . Peripheral neuropathy   . Arthritis   . Joint pain   . Seasonal allergies   . History of colon polyps   . History of kidney stones   . DIABETES MELLITUS-TYPE II 09/01/2010  . Plavix resistance   .  Heterozygous factor V Leiden mutation   . Obesity   . Pneumonia 04/12/2015    Medications:  Scheduled:  . antiseptic oral rinse  7 mL Mouth Rinse QID  . atorvastatin  40 mg Per Tube q1800  . azithromycin  500 mg Intravenous Q24H  . cefTAZidime (FORTAZ)  IV  2 g Intravenous 3 times per day  . chlorhexidine  15 mL Mouth Rinse BID  . feeding supplement (PRO-STAT SUGAR FREE 64)  30 mL Per Tube BID  . feeding supplement (VITAL HIGH PROTEIN)  1,000 mL Per Tube Q24H  . free water  100 mL Per Tube 3 times per day  . insulin aspart  0-15 Units Subcutaneous 6 times per day  . insulin glargine  30 Units Subcutaneous BID  . levalbuterol  0.63 mg Nebulization Q6H  . metoprolol tartrate  12.5 mg Per Tube BID  . pantoprazole sodium  40 mg Per Tube Q1200  . prasugrel  5 mg Oral Daily  . vancomycin  1,000 mg Intravenous Q12H    Assessment: 64yo male with history of factor V Leiden and PE presents with cough and fever from PCP.   AC: Coumadin for hx factor V Leiden +  hx PE, INR 1.51 on admit (5mg  daily except 7.5mg  on Wed, last dose 7/8), now 3.19. Will hold warfarin while in ICU. Convert to heparin  Goal of Therapy:  Heparin level 0.3-0.7 units/ml Monitor platelets by anticoagulation protocol: Yes   Plan:  -Discontinue warfarin -Initiate heparin when INR < 2 -Daily INR until heparin initiated  Isaac BlissMichael Auriana Scalia, PharmD, BCPS Clinical Pharmacist Pager [phone] 04/06/2015 2:08 PM

## 2015-04-06 NOTE — Progress Notes (Signed)
Nutrition Follow-up  DOCUMENTATION CODES:  Obesity unspecified  INTERVENTION:  Decrease Vital High Protein to 10 ml/hr Increase Prostat to 60 ml five times per day  TF regimen provides: 1240 kcal, 171 grams protein, and 200 ml H2O. Total free water 500 ml TF regimen and propofol at current rate providing 2037 total kcal/day (16 kcal/kg of actual body weight)    NUTRITION DIAGNOSIS:  Inadequate oral intake related to inability to eat as evidenced by NPO status.  ongoing  GOAL:  Provide needs based on ASPEN/SCCM guidelines  Not met.   MONITOR:  Vent status, Weight trends, Labs, I & O's  REASON FOR ASSESSMENT:  Ventilator    ASSESSMENT:  64 y.o. male PMHx HTN, HLD, obesity, OSA, DM, presents to the ED with a chief complaint of cough, fever and chills, as well as SOB for the past 3 days. Xray shows PNA   Patient is currently intubated on ventilator support MV: 13.3 L/min Temp (24hrs), Avg:100.6 F (38.1 C), Min:98.9 F (37.2 C), Max:102.8 F (39.3 C)  Propofol: 30.2 ml/hr provides 797 kcal per day from lipid  Pt started on TF protocol 7/10  Height:  Ht Readings from Last 1 Encounters:  04/04/15 $RemoveB'5\' 11"'[id number]$  (1.803 m)    Weight:  Wt Readings from Last 1 Encounters:  04/06/15 281 lb 8.4 oz (127.7 kg)    Ideal Body Weight:  78.2 kg  Wt Readings from Last 10 Encounters:  04/06/15 281 lb 8.4 oz (127.7 kg)  04/04/2015 271 lb 6.4 oz (123.106 kg)  03/26/15 278 lb (126.1 kg)  01/21/15 272 lb 6.4 oz (123.56 kg)  01/14/15 279 lb (126.554 kg)  10/21/14 272 lb (123.378 kg)  10/14/14 272 lb (123.378 kg)  10/06/14 274 lb (124.286 kg)  09/08/14 276 lb 9.6 oz (125.465 kg)  09/02/14 279 lb (126.554 kg)    BMI:  Body mass index is 39.28 kg/(m^2).  Estimated Nutritional Needs:  Kcal:  1353-1722 kcal (11-14 kcal/kg)  Protein:  156 g Pro (2g/kg IBW)  Fluid:  Per MD  Skin:  Reviewed, no issues  Diet Order:    NPO  EDUCATION NEEDS:  Education needs no  appropriate at this time   Intake/Output Summary (Last 24 hours) at 04/06/15 1506 Last data filed at 04/06/15 1422  Gross per 24 hour  Intake 2441.89 ml  Output   1475 ml  Net 966.89 ml    Last BM:  7/11 - watery, c.diff pending  Maylon Peppers RD, [place], [place] Pager [phone] After Hours Pager

## 2015-04-06 NOTE — Procedures (Deleted)
Patient refuses to wear Bipap. Breathing is unlabored at this time, RT will continue to monitor.  

## 2015-04-07 LAB — COMPREHENSIVE METABOLIC PANEL
ALT: 25 U/L (ref 17–63)
AST: 31 U/L (ref 15–41)
Albumin: 1.9 g/dL — ABNORMAL LOW (ref 3.5–5.0)
Alkaline Phosphatase: 148 U/L — ABNORMAL HIGH (ref 38–126)
Anion gap: 9 (ref 5–15)
BUN: 34 mg/dL — ABNORMAL HIGH (ref 6–20)
CO2: 19 mmol/L — AB (ref 22–32)
CREATININE: 1.7 mg/dL — AB (ref 0.61–1.24)
Calcium: 8.7 mg/dL — ABNORMAL LOW (ref 8.9–10.3)
Chloride: 106 mmol/L (ref 101–111)
GFR calc Af Amer: 48 mL/min — ABNORMAL LOW (ref >60)
GFR, EST NON AFRICAN AMERICAN: 42 mL/min — AB (ref >60)
GLUCOSE: 338 mg/dL — AB (ref 65–99)
POTASSIUM: 3.4 mmol/L — AB (ref 3.5–5.1)
SODIUM: 134 mmol/L — AB (ref 135–145)
Total Bilirubin: 0.5 mg/dL (ref 0.3–1.2)
Total Protein: 6 g/dL — ABNORMAL LOW (ref 6.5–8.1)

## 2015-04-07 LAB — CBC WITH DIFFERENTIAL/PLATELET
BASOS PCT: 3 % — AB (ref 0–1)
Basophils Absolute: 0 10*3/uL (ref 0.0–0.1)
EOS ABS: 0.1 10*3/uL (ref 0.0–0.7)
Eosinophils Relative: 10 % — ABNORMAL HIGH (ref 0–5)
HEMATOCRIT: 29.1 % — AB (ref 39.0–52.0)
Hemoglobin: 9.8 g/dL — ABNORMAL LOW (ref 13.0–17.0)
LYMPHS PCT: 37 % (ref 12–46)
Lymphs Abs: 0.5 10*3/uL — ABNORMAL LOW (ref 0.7–4.0)
MCH: 27.7 pg (ref 26.0–34.0)
MCHC: 33.7 g/dL (ref 30.0–36.0)
MCV: 82.2 fL (ref 78.0–100.0)
MONOS PCT: 8 % (ref 3–12)
Monocytes Absolute: 0.1 10*3/uL (ref 0.1–1.0)
Neutro Abs: 0.6 10*3/uL — ABNORMAL LOW (ref 1.7–7.7)
Neutrophils Relative %: 42 % — ABNORMAL LOW (ref 43–77)
Platelets: 194 10*3/uL (ref 150–400)
RBC: 3.54 MIL/uL — ABNORMAL LOW (ref 4.22–5.81)
RDW: 16 % — ABNORMAL HIGH (ref 11.5–15.5)
WBC: 1.3 10*3/uL — CL (ref 4.0–10.5)

## 2015-04-07 LAB — GLUCOSE, CAPILLARY
GLUCOSE-CAPILLARY: 320 mg/dL — AB (ref 65–99)
Glucose-Capillary: 277 mg/dL — ABNORMAL HIGH (ref 65–99)
Glucose-Capillary: 282 mg/dL — ABNORMAL HIGH (ref 65–99)
Glucose-Capillary: 301 mg/dL — ABNORMAL HIGH (ref 65–99)
Glucose-Capillary: 326 mg/dL — ABNORMAL HIGH (ref 65–99)
Glucose-Capillary: 374 mg/dL — ABNORMAL HIGH (ref 65–99)

## 2015-04-07 LAB — PROTIME-INR
INR: 3.83 — ABNORMAL HIGH (ref 0.00–1.49)
Prothrombin Time: 36.8 seconds — ABNORMAL HIGH (ref 11.6–15.2)

## 2015-04-07 LAB — RESPIRATORY VIRUS PANEL
ADENOVIRUS: NEGATIVE
Influenza A: NEGATIVE
Influenza B: NEGATIVE
METAPNEUMOVIRUS: NEGATIVE
PARAINFLUENZA 1 A: NEGATIVE
Parainfluenza 2: NEGATIVE
Parainfluenza 3: NEGATIVE
RESPIRATORY SYNCYTIAL VIRUS A: NEGATIVE
Respiratory Syncytial Virus B: NEGATIVE
Rhinovirus: NEGATIVE

## 2015-04-07 LAB — TRIGLYCERIDES: TRIGLYCERIDES: 249 mg/dL — AB (ref <150)

## 2015-04-07 MED ORDER — VITAL HIGH PROTEIN PO LIQD
1000.0000 mL | ORAL | Status: DC
  Administered 2015-04-08 – 2015-04-16 (×11): 1000 mL
  Filled 2015-04-07 (×12): qty 1000

## 2015-04-07 MED ORDER — INSULIN ASPART 100 UNIT/ML ~~LOC~~ SOLN
0.0000 [IU] | SUBCUTANEOUS | Status: DC
  Administered 2015-04-07: 15 [IU] via SUBCUTANEOUS
  Administered 2015-04-07: 20 [IU] via SUBCUTANEOUS
  Administered 2015-04-07 – 2015-04-08 (×2): 11 [IU] via SUBCUTANEOUS
  Administered 2015-04-08 (×2): 7 [IU] via SUBCUTANEOUS

## 2015-04-07 MED ORDER — INSULIN GLARGINE 100 UNIT/ML ~~LOC~~ SOLN
45.0000 [IU] | Freq: Two times a day (BID) | SUBCUTANEOUS | Status: DC
  Administered 2015-04-07 – 2015-04-08 (×2): 45 [IU] via SUBCUTANEOUS
  Filled 2015-04-07 (×3): qty 0.45

## 2015-04-07 MED ORDER — POTASSIUM CHLORIDE 10 MEQ/100ML IV SOLN: 10.0000 meq | INTRAVENOUS | Status: DC

## 2015-04-07 MED ORDER — POTASSIUM CHLORIDE 20 MEQ/15ML (10%) PO SOLN
40.0000 meq | Freq: Once | ORAL | Status: DC
  Filled 2015-04-07: qty 30

## 2015-04-07 MED ORDER — LORAZEPAM 2 MG/ML IJ SOLN
2.0000 mg | INTRAMUSCULAR | Status: DC | PRN
  Administered 2015-04-07 – 2015-04-08 (×3): 2 mg via INTRAVENOUS
  Filled 2015-04-07 (×3): qty 1

## 2015-04-07 NOTE — Progress Notes (Signed)
PULMONARY / CRITICAL CARE MEDICINE   Name: [name] MRN: [id number] DOB: Feb 06, 1951    ADMISSION DATE:  04/05/2015 CONSULTATION DATE:  04/04/2015  REFERRING MD : Triad  CHIEF COMPLAINT:  AMS  INITIAL PRESENTATION:  64 y/o admitted for community acquired pneumonia was found down in hospital room with bradycardia and apnea. Intubated and PCCM assumed care. PMH also includes OSA, DM 2, PE, factor 5 Leiden  MAJOR EVENTS/TEST RESULTS: 7/08 Admitted to telemetry by [facility] with dx of CAP 7/09 early AM: found by RN staf on floor, face down, bradycardic, apneic. Code blue called. Brief chest compressions. No meds. Intubated. Transferred to ICU.PCCM assumed care 7/09 CT head/neck: No evidence of intracranial injury or facial fracture. Left neck contusion without evidence of cervical spine injury 7/10 Persistent fever. Leukopenic. Abx expanded. Transitioned off insulin gtt to Lantus and SSI  INDWELLING DEVICES:: ETT 7/09 >>   MICRO DATA: Blood 7/08 >>  Resp 7/09 >>   PCT 7/09: 0.74,   ANTIMICROBIALS:  Ceftriaxone 7/08 >> 7/10 Azithro 7/08 >> 7/10>> restarted azithro 7/10>> Vanc 7/10 >>  Ceftaz 7/10 >>    SUBJ: Intubated, RASS -1  VITAL SIGNS: Temp:  [98.7 F (37.1 C)-101.1 F (38.4 C)] 98.7 F (37.1 C) (07/12 1300) Pulse Rate:  [79-97] 95 (07/12 1300) Resp:  [13-38] 22 (07/12 1300) BP: (113-168)/(51-90) 160/70 mmHg (07/12 1300) SpO2:  [98 %-100 %] 98 % (07/12 1356) FiO2 (%):  [40 %] 40 % (07/12 1356) HEMODYNAMICS:   VENTILATOR SETTINGS: Vent Mode:  [-] PSV;CPAP FiO2 (%):  [40 %] 40 % Set Rate:  [12 bmp] 12 bmp Vt Set:  [600 mL] 600 mL PEEP:  [5 cmH20] 5 cmH20 Pressure Support:  [8 cmH20] 8 cmH20 Plateau Pressure:  [24 cmH20-33 cmH20] 27 cmH20 INTAKE / OUTPUT:  Intake/Output Summary (Last 24 hours) at 04/07/15 1444 Last data filed at 04/07/15 1300  Gross per 24 hour  Intake 1630.7 ml  Output   1325 ml  Net  305.7 ml    PHYSICAL EXAMINATION: General:  NAD,  RASS -1 Neuro:  No focal deficits HEENT: nasal contusion, cervical collar Cardiovascular:  RRR Lungs: few rhonchi, no wheezes Abdomen:  Obese, sft, +BS Ext: mild b/l LE edema, warm  LABS:  CBC  Recent Labs Lab 04/05/15 0335 04/06/15 0646 04/07/15 0212  WBC 1.1* 1.1* 1.3*  HGB 11.8* 9.9* 9.8*  HCT 35.7* 29.8* 29.1*  PLT 187 197 194   Coag's  Recent Labs Lab 04/05/15 0335 04/06/15 0646 04/07/15 0212  INR 1.72* 3.19* 3.83*   BMET  Recent Labs Lab 04/05/15 0335 04/06/15 0646 04/07/15 0212  NA 136 134* 134*  K 4.1 3.7 3.4*  CL 107 105 106  CO2 19* 19* 19*  BUN 16 25* 34*  CREATININE 1.34* 1.64* 1.70*  GLUCOSE 216* 244* 338*   Electrolytes  Recent Labs Lab 04/05/15 0335 04/06/15 0646 04/07/15 0212  CALCIUM 8.7* 8.4* 8.7*   Sepsis Markers  Recent Labs Lab 04/01/2015 1346 04/25/2015 1558 04/26/2015 1924 04/04/15 0155 04/06/15 1116  LATICACIDVEN 2.19* 0.94 1.8  --   --   PROCALCITON  --   --   --  0.74 6.53   ABG  Recent Labs Lab 04/04/15 0205 04/04/15 0308 04/04/15 0522  PHART 7.018* 7.262* 7.295*  PCO2ART 62.3* 43.7 38.1  PO2ART 161* 248.0* 79.0*   Liver Enzymes  Recent Labs Lab 04/05/15 0335 04/06/15 0646 04/07/15 0212  AST ALT ALKPHOS 118 118 148*  BILITOT 0.5 0.3 0.5  ALBUMIN 2.7* 2.0* 1.9*   Cardiac Enzymes  Recent Labs Lab 04/04/15 1131 04/05/15 0335  TROPONINI 0.13* 0.07*   Glucose  Recent Labs Lab 04/06/15 1620 04/06/15 1952 04/06/15 2356 04/07/15 0322 04/07/15 0759 04/07/15 1224  GLUCAP 296* 302* 277* 326* 301* 374*    7/11 CXR: NGT, ETT in good position, persistent low lung volumes, small left pleural effusion  ASSESSMENT / PLAN:  PULMONARY A: Acute respiratory failure  Likely 2/2 legionella pneomonia OSA - CPAP prior to admission  P:   Cont full vent support  Cont vent bundle Daily SBT if/when meets criteria  CARDIOVASCULAR A:  Hypertension, controlled S/p bradycardic  arrest - suspect primary resp event  Cardiac markers negative P:  Monitor MAP goal > 65 mmHg Holding most home antihypertensive meds Cont metoprolol per tube  RENAL A:   AKI- likely ATN Very mild hyponatremia P:   Monitor BMET intermittently Monitor I/Os Correct electrolytes as indicated  GASTROINTESTINAL A:   Loose stools - improved P:   SUP: enteral pantoprazole Initiate TFs 7/10 C diff neg  HEMATOLOGIC A:   Factor V Leiden with h/o PE Neutropenia, recurrent  Has documented history of this dating to 2013 P:  DVT px: warfarin per pharmacy Monitor CBC intermittently Transfuse per usual ICU guidelines INR trending up to 3.83 today  INFECTIOUS A:   CAP, NOS Severe sepsis 2/2 CAP legionella P:   Monitor temp, WBC count Follow PCT Micro and abx as above  ENDOCRINE A:   DM 2, poorly controlled P:   Increase Lantus to 45 BID Resistant SSI  NEUROLOGIC A:   Acute encephalopathy Neck contusion by CT scan - no spine or cord injury noted P:   RASS goal: -1, -2 PAD protocol - propofol, PRN fentanyl Daily WUA Cont C collar Will need clinical clearance of C spine after extubation   FAMILY  - Update: wife and son updated @ bedside 7/12     Amarianna Abplanalp A. Kennon RoundsHaney MD, MS Family Medicine Resident PGY-2 Pager [phone]

## 2015-04-07 NOTE — Progress Notes (Signed)
Nutrition Follow-up  DOCUMENTATION CODES:  Obesity unspecified  INTERVENTION:   Increase Vital High Protein goal rate to 60 ml/h with Prostat 30 ml BID to provide 1640 kcals, 156 gm protein, 1204 ml free water daily.  NUTRITION DIAGNOSIS:  Inadequate oral intake related to inability to eat as evidenced by NPO status.  Ongoing  GOAL:  Provide needs based on ASPEN/SCCM guidelines  Progressing  MONITOR:  Vent status, Weight trends, Labs, I & O's   ASSESSMENT:  64 y.o. male PMHx HTN, HLD, obesity, OSA, DM, presents to the ED with a chief complaint of cough, fever and chills, as well as SOB for the past 3 days. Xray shows PNA  Patient remains intubated on ventilator support. Per discussion with RN, patient is no longer requiring Propofol. RD to adjust TF to better meet nutrition needs now that Propofol is off.   Diet Order:   NPO  Skin:  Reviewed, no issues  Last BM:  7/11 watery, c diff negative  Height:  Ht Readings from Last 1 Encounters:  04/04/15 5\' 11"  (1.803 m)    Weight:  Wt Readings from Last 1 Encounters:  04/06/15 281 lb 8.4 oz (127.7 kg)    Ideal Body Weight:  78.2 kg   BMI:  Body mass index is 39.28 kg/(m^2).  Estimated Nutritional Needs:  Kcal:  1353-1722 kcal (11-14 kcal/kg)  Protein:  156 g Pro (2g/kg IBW)  Fluid:  Per MD  EDUCATION NEEDS:  Education needs no appropriate at this time   Joaquin CourtsKimberly Nneka Blanda, RD, LDN, CNSC Pager [phone] After Hours Pager [phone]

## 2015-04-07 NOTE — Progress Notes (Signed)
ANTICOAGULATION CONSULT NOTE - Initial Consult  Pharmacy Consult for Heparin Indication: Factor V Leiden, Hx of PE  Allergies  Allergen Reactions  . Piroxicam Other (See Comments)    Blistering on hands  . Rofecoxib Other (See Comments)    REACTION: Reaction not known    Patient Measurements: Height: 5\' 11"  (180.3 cm) Weight: 281 lb 8.4 oz (127.7 kg) IBW/kg (Calculated) : 75.3 Heparin Dosing Weight: 103 kg  Vital Signs: Temp: 99.4 F (37.4 C) (07/12 0805) Temp Source: Core (Comment) (07/12 0400) BP: 138/71 mmHg (07/12 1127) Pulse Rate: 91 (07/12 1127)  Labs:  Recent Labs  04/05/15 0335 04/06/15 0646 04/07/15 0212  HGB 11.8* 9.9* 9.8*  HCT 35.7* 29.8* 29.1*  PLT 187 197 194  LABPROT 20.1* 32.0* 36.8*  INR 1.72* 3.19* 3.83*  CREATININE 1.34* 1.64* 1.70*  TROPONINI 0.07*  --   --     Estimated Creatinine Clearance: 62.2 mL/min (by C-G formula based on Cr of 1.7).   Medical History: Past Medical History  Diagnosis Date  . Hyperlipidemia   . SLEEP APNEA, OBSTRUCTIVE 01/16/2009  . ESOPHAGEAL STRICTURE 01/16/2009  . PSORIASIS     uses a cream  . PULMONARY EMBOLISM     takes Coumadin daily  . NEPHROLITHIASIS, HX OF 02/27/2009  . ANEMIA-UNSPECIFIED 04/28/2010  . CAD (coronary artery disease)     a. s/p DES to RCA x2 in 2005. b. s/p DES to CFX 2009. c. s/p DES PTCA of RCA for in stent restenosis 03/2010. d. s/p Promus DES x2 to RCA 07/16/10(recurrent pain with abnormal Myoview 06/2010; residual nonobstructive disease at cath.  . Leukopenia 11/11/2011  . DEPRESSION   . HYPERTENSION   . GERD   . PONV (postoperative nausea and vomiting)   . Peripheral edema   . Peripheral neuropathy   . Arthritis   . Joint pain   . Seasonal allergies   . History of colon polyps   . History of kidney stones   . DIABETES MELLITUS-TYPE II 09/01/2010  . Plavix resistance   . Heterozygous factor V Leiden mutation   . Obesity   . Pneumonia 13-May-2015    Medications:  Scheduled:  .  antiseptic oral rinse  7 mL Mouth Rinse QID  . atorvastatin  40 mg Per Tube q1800  . azithromycin  500 mg Intravenous Q24H  . cefTAZidime (FORTAZ)  IV  2 g Intravenous 3 times per day  . chlorhexidine  15 mL Mouth Rinse BID  . feeding supplement (PRO-STAT SUGAR FREE 64)  60 mL Per Tube 5 X Daily  . feeding supplement (VITAL HIGH PROTEIN)  1,000 mL Per Tube Q24H  . free water  100 mL Per Tube 3 times per day  . insulin aspart  0-20 Units Subcutaneous 6 times per day  . insulin glargine  45 Units Subcutaneous BID  . levalbuterol  0.63 mg Nebulization Q6H  . metoprolol tartrate  12.5 mg Per Tube BID  . pantoprazole sodium  40 mg Per Tube Q1200  . potassium chloride  40 mEq Per Tube Once  . prasugrel  5 mg Oral Daily    Assessment: 64yo male with history of factor V Leiden and PE presents with cough and fever from PCP.   AC: Coumadin for hx factor V Leiden + hx PE, INR 1.51 on admit (5mg  daily except 7.5mg  on Wed, last dose 7/8), now 3.83. Will hold warfarin while in ICU (last dose 7/10). Convert to heparin  Hem/Onc: H&H 9.8/29.1, Plt 194 Goal of  Therapy:  Heparin level 0.3-0.7 units/ml Monitor platelets by anticoagulation protocol: Yes   Plan:  -Initiate heparin when INR < 2 -Daily INR until heparin initiated  Isaac Bliss, PharmD, BCPS Clinical Pharmacist Pager [phone] 04/07/2015 11:31 AM

## 2015-04-08 LAB — BASIC METABOLIC PANEL
ANION GAP: 7 (ref 5–15)
BUN: 49 mg/dL — ABNORMAL HIGH (ref 6–20)
CO2: 23 mmol/L (ref 22–32)
CREATININE: 1.44 mg/dL — AB (ref 0.61–1.24)
Calcium: 9.7 mg/dL (ref 8.9–10.3)
Chloride: 111 mmol/L (ref 101–111)
GFR calc Af Amer: 59 mL/min — ABNORMAL LOW (ref >60)
GFR, EST NON AFRICAN AMERICAN: 51 mL/min — AB (ref >60)
Glucose, Bld: 246 mg/dL — ABNORMAL HIGH (ref 65–99)
POTASSIUM: 3.4 mmol/L — AB (ref 3.5–5.1)
SODIUM: 141 mmol/L (ref 135–145)

## 2015-04-08 LAB — COMPREHENSIVE METABOLIC PANEL
ALBUMIN: 1.7 g/dL — AB (ref 3.5–5.0)
ALK PHOS: 141 U/L — AB (ref 38–126)
ALT: 25 U/L (ref 17–63)
AST: 23 U/L (ref 15–41)
Anion gap: 10 (ref 5–15)
BUN: 42 mg/dL — ABNORMAL HIGH (ref 6–20)
CHLORIDE: 109 mmol/L (ref 101–111)
CO2: 22 mmol/L (ref 22–32)
CREATININE: 1.4 mg/dL — AB (ref 0.61–1.24)
Calcium: 9.4 mg/dL (ref 8.9–10.3)
GFR calc Af Amer: >60 mL/min (ref >60)
GFR calc non Af Amer: 53 mL/min — ABNORMAL LOW (ref >60)
GLUCOSE: 252 mg/dL — AB (ref 65–99)
Potassium: 3.2 mmol/L — ABNORMAL LOW (ref 3.5–5.1)
Sodium: 141 mmol/L (ref 135–145)
Total Bilirubin: 0.3 mg/dL (ref 0.3–1.2)
Total Protein: 6 g/dL — ABNORMAL LOW (ref 6.5–8.1)

## 2015-04-08 LAB — CBC WITH DIFFERENTIAL/PLATELET
BASOS PCT: 4 % — AB (ref 0–1)
Basophils Absolute: 0 10*3/uL (ref 0.0–0.1)
EOS ABS: 0.1 10*3/uL (ref 0.0–0.7)
Eosinophils Relative: 12 % — ABNORMAL HIGH (ref 0–5)
HEMATOCRIT: 28.9 % — AB (ref 39.0–52.0)
Hemoglobin: 9.8 g/dL — ABNORMAL LOW (ref 13.0–17.0)
Lymphocytes Relative: 37 % (ref 12–46)
Lymphs Abs: 0.3 10*3/uL — ABNORMAL LOW (ref 0.7–4.0)
MCH: 27.4 pg (ref 26.0–34.0)
MCHC: 33.9 g/dL (ref 30.0–36.0)
MCV: 80.7 fL (ref 78.0–100.0)
Monocytes Absolute: 0 10*3/uL — ABNORMAL LOW (ref 0.1–1.0)
Monocytes Relative: 2 % — ABNORMAL LOW (ref 3–12)
Neutro Abs: 0.5 10*3/uL — ABNORMAL LOW (ref 1.7–7.7)
Neutrophils Relative %: 45 % (ref 43–77)
PLATELETS: 225 10*3/uL (ref 150–400)
RBC: 3.58 MIL/uL — ABNORMAL LOW (ref 4.22–5.81)
RDW: 15.5 % (ref 11.5–15.5)
WBC: 0.9 10*3/uL — AB (ref 4.0–10.5)

## 2015-04-08 LAB — GLUCOSE, CAPILLARY
GLUCOSE-CAPILLARY: 223 mg/dL — AB (ref 65–99)
GLUCOSE-CAPILLARY: 221 mg/dL — AB (ref 65–99)
GLUCOSE-CAPILLARY: 228 mg/dL — AB (ref 65–99)
GLUCOSE-CAPILLARY: 238 mg/dL — AB (ref 65–99)
GLUCOSE-CAPILLARY: 238 mg/dL — AB (ref 65–99)
Glucose-Capillary: 268 mg/dL — ABNORMAL HIGH (ref 65–99)

## 2015-04-08 LAB — PATHOLOGIST SMEAR REVIEW

## 2015-04-08 LAB — MAGNESIUM: Magnesium: 1.7 mg/dL (ref 1.7–2.4)

## 2015-04-08 LAB — CULTURE, BLOOD (ROUTINE X 2)
Culture: NO GROWTH
Culture: NO GROWTH

## 2015-04-08 LAB — PROTIME-INR
INR: 2.74 — ABNORMAL HIGH (ref 0.00–1.49)
Prothrombin Time: 28.6 seconds — ABNORMAL HIGH (ref 11.6–15.2)

## 2015-04-08 LAB — PHOSPHORUS: Phosphorus: 2.4 mg/dL — ABNORMAL LOW (ref 2.5–4.6)

## 2015-04-08 LAB — TRIGLYCERIDES: Triglycerides: 237 mg/dL — ABNORMAL HIGH (ref <150)

## 2015-04-08 MED ORDER — INSULIN NPH (HUMAN) (ISOPHANE) 100 UNIT/ML ~~LOC~~ SUSP: 20.0000 [IU] | Freq: Four times a day (QID) | SUBCUTANEOUS | Status: DC

## 2015-04-08 MED ORDER — ACETAMINOPHEN 160 MG/5ML PO SOLN
650.0000 mg | Freq: Four times a day (QID) | ORAL | Status: DC | PRN
  Administered 2015-04-08 – 2015-04-16 (×9): 650 mg via ORAL
  Filled 2015-04-08 (×9): qty 20.3

## 2015-04-08 MED ORDER — INSULIN ASPART 100 UNIT/ML ~~LOC~~ SOLN: 0.0000 [IU] | Freq: Four times a day (QID) | SUBCUTANEOUS | Status: DC

## 2015-04-08 MED ORDER — INSULIN REGULAR HUMAN 100 UNIT/ML IJ SOLN
0.0000 [IU] | Freq: Four times a day (QID) | INTRAMUSCULAR | Status: DC
Start: 2015-04-09 — End: 2015-04-10
  Administered 2015-04-09: 8 [IU] via SUBCUTANEOUS
  Administered 2015-04-09: 5 [IU] via SUBCUTANEOUS
  Administered 2015-04-09: 8 [IU] via SUBCUTANEOUS
  Administered 2015-04-09: 5 [IU] via SUBCUTANEOUS
  Administered 2015-04-10: 8 [IU] via SUBCUTANEOUS
  Administered 2015-04-10 (×2): 11 [IU] via SUBCUTANEOUS
  Filled 2015-04-08 (×36): qty 0.15

## 2015-04-08 MED ORDER — INSULIN REGULAR HUMAN 100 UNIT/ML IJ SOLN
0.0000 [IU] | Freq: Four times a day (QID) | INTRAMUSCULAR | Status: DC
  Administered 2015-04-08 (×2): 5 [IU] via SUBCUTANEOUS
  Filled 2015-04-08 (×31): qty 0.15

## 2015-04-08 MED ORDER — LORAZEPAM 2 MG/ML IJ SOLN
2.0000 mg | INTRAMUSCULAR | Status: DC | PRN
  Administered 2015-04-08: 4 mg via INTRAVENOUS
  Administered 2015-04-09 (×2): 2 mg via INTRAVENOUS
  Administered 2015-04-09: 4 mg via INTRAVENOUS
  Administered 2015-04-09 – 2015-04-10 (×4): 2 mg via INTRAVENOUS
  Administered 2015-04-11: 4 mg via INTRAVENOUS
  Administered 2015-04-16: 2 mg via INTRAVENOUS
  Filled 2015-04-08 (×2): qty 1
  Filled 2015-04-08: qty 2
  Filled 2015-04-08: qty 1
  Filled 2015-04-08: qty 2
  Filled 2015-04-08: qty 1
  Filled 2015-04-08: qty 2
  Filled 2015-04-08 (×5): qty 1

## 2015-04-08 MED ORDER — INSULIN NPH (HUMAN) (ISOPHANE) 100 UNIT/ML ~~LOC~~ SUSP
20.0000 [IU] | Freq: Four times a day (QID) | SUBCUTANEOUS | Status: DC
  Administered 2015-04-08 (×2): 20 [IU] via SUBCUTANEOUS
  Filled 2015-04-08: qty 10

## 2015-04-08 MED ORDER — INSULIN REGULAR HUMAN 100 UNIT/ML IJ SOLN
0.0000 [IU] | Freq: Four times a day (QID) | INTRAMUSCULAR | Status: DC
  Filled 2015-04-08 (×28): qty 0.15

## 2015-04-08 MED ORDER — MAGNESIUM SULFATE 2 GM/50ML IV SOLN
2.0000 g | Freq: Once | INTRAVENOUS | Status: AC
  Administered 2015-04-08: 2 g via INTRAVENOUS
  Filled 2015-04-08: qty 50

## 2015-04-08 MED ORDER — POTASSIUM CHLORIDE 20 MEQ/15ML (10%) PO SOLN
30.0000 meq | ORAL | Status: AC
  Administered 2015-04-08 (×2): 30 meq
  Filled 2015-04-08 (×2): qty 30

## 2015-04-08 MED ORDER — INSULIN ASPART 100 UNIT/ML ~~LOC~~ SOLN: 0.0000 [IU] | SUBCUTANEOUS | Status: DC

## 2015-04-08 MED ORDER — INSULIN NPH (HUMAN) (ISOPHANE) 100 UNIT/ML ~~LOC~~ SUSP
20.0000 [IU] | Freq: Four times a day (QID) | SUBCUTANEOUS | Status: DC
Start: 2015-04-09 — End: 2015-04-09
  Administered 2015-04-09 (×2): 20 [IU] via SUBCUTANEOUS
  Filled 2015-04-08: qty 10

## 2015-04-08 NOTE — Progress Notes (Signed)
ANTICOAGULATION CONSULT NOTE - Initial Consult  Pharmacy Consult for Heparin Indication: Factor V Leiden, Hx of PE  Allergies  Allergen Reactions  . Piroxicam Other (See Comments)    Blistering on hands  . Rofecoxib Other (See Comments)    REACTION: Reaction not known    Patient Measurements: Height:  (180.3 cm) Weight: 281 lb 8.4 oz (127.7 kg) IBW/kg (Calculated) : 75.3 Heparin Dosing Weight: 103 kg  Vital Signs: Temp: 99.2 F (37.3 C) (07/13 0700) Temp Source: Core (Comment) (07/13 0700) BP: 154/71 mmHg (07/13 0700) Pulse Rate: 89 (07/13 0700)  Labs:  Recent Labs  04/06/15 0646 04/07/15 0212 04/08/15 0220  HGB 9.9* 9.8* 9.8*  HCT 29.8* 29.1* 28.9*  PLT 197 194 225  LABPROT 32.0* 36.8* 28.6*  INR 3.19* 3.83* 2.74*  CREATININE 1.64* 1.70* 1.40*    Estimated Creatinine Clearance: 75.5 mL/min (by C-G formula based on Cr of 1.4).   Medical History: Past Medical History  Diagnosis Date  . Hyperlipidemia   . SLEEP APNEA, OBSTRUCTIVE 01/16/2009  . ESOPHAGEAL STRICTURE 01/16/2009  . PSORIASIS     uses a cream  . PULMONARY EMBOLISM     takes Coumadin daily  . NEPHROLITHIASIS, HX OF 02/27/2009  . ANEMIA-UNSPECIFIED 04/28/2010  . CAD (coronary artery disease)     a. s/p DES to RCA x2 in 2005. b. s/p DES to CFX 2009. c. s/p DES PTCA of RCA for in stent restenosis 03/2010. d. s/p Promus DES x2 to RCA 07/16/10(recurrent pain with abnormal Myoview 06/2010; residual nonobstructive disease at cath.  . Leukopenia 11/11/2011  . DEPRESSION   . HYPERTENSION   . GERD   . PONV (postoperative nausea and vomiting)   . Peripheral edema   . Peripheral neuropathy   . Arthritis   . Joint pain   . Seasonal allergies   . History of colon polyps   . History of kidney stones   . DIABETES MELLITUS-TYPE II 09/01/2010  . Plavix resistance   . Heterozygous factor V Leiden mutation   . Obesity   . Pneumonia April 30, 2015    Medications:  Scheduled:  . antiseptic oral rinse  7 mL  Mouth Rinse QID  . atorvastatin  40 mg Per Tube q1800  . azithromycin  500 mg Intravenous Q24H  . cefTAZidime (FORTAZ)  IV  2 g Intravenous 3 times per day  . chlorhexidine  15 mL Mouth Rinse BID  . feeding supplement (PRO-STAT SUGAR FREE 64)  60 mL Per Tube 5 X Daily  . feeding supplement (VITAL HIGH PROTEIN)  1,000 mL Per Tube Q24H  . free water  100 mL Per Tube 3 times per day  . insulin aspart  0-20 Units Subcutaneous 6 times per day  . insulin glargine  45 Units Subcutaneous BID  . levalbuterol  0.63 mg Nebulization Q6H  . metoprolol tartrate  12.5 mg Per Tube BID  . pantoprazole sodium  40 mg Per Tube Q1200  . potassium chloride  30 mEq Per Tube Q4H  . prasugrel  5 mg Oral Daily    Assessment: 64yo male with history of factor V Leiden and PE presents with cough and fever from PCP.   AC: Coumadin for hx factor V Leiden + hx PE, INR 1.51 on admit (  daily except 7.5mg  on Wed, last dose 7/8), now 2.74. Will hold warfarin while in ICU. Convert to heparin  Goal of Therapy:  Heparin level 0.3-0.7 units/ml Monitor platelets by anticoagulation protocol: Yes   Plan:  -Initiate  heparin when INR < 2 -Daily INR until heparin initiated  Isaac BlissMichael Melvina Pangelinan, PharmD, [facility] PaBCPS Clinical Pharmacist Pager [phone] 04/08/2015 7:48 AM

## 2015-04-08 NOTE — Progress Notes (Signed)
RN called RT to room to apply a foam adhesive strip below the tube holder on the upper lip due to swelling.  Completed 16:25.

## 2015-04-08 NOTE — Progress Notes (Signed)
[facility]OC[facility] FOR AM LAB REPLACEMENT ONLY  The patient does apply for the [facility] Electrolyte [facility] Protocol based on the criteria listed below:   1. Is GFR >/= 40 ml/min? Yes.    Patient's GFR today is 53 2. Is urine output >/= 0.5 ml/kg/hr for the last 6 hours? Yes.   Patient's UOP is 1.1 ml/kg/hr 3. Is BUN < 60 mg/dL? Yes.    Patient's BUN today is 42 4. Abnormal electrolyte K 3.2, Mg 1.7,  5. Ordered repletion with: per protocol 6. If a panic level lab has been reported, has the CCM MD in charge been notified? Yes.  .   Physician:  Gillis Santalva  Nea Gittens McEachran 04/08/2015 4:10 AM

## 2015-04-08 NOTE — Progress Notes (Signed)
PULMONARY / CRITICAL CARE MEDICINE   Name: [name] MRN: [id number] DOB: 1951-03-20    ADMISSION DATE:  04/23/2015 CONSULTATION DATE:  04/04/2015  REFERRING MD : Triad  CHIEF COMPLAINT:  AMS  INITIAL PRESENTATION:  64 y/o admitted for community acquired pneumonia was found down in hospital room with bradycardia and apnea. Intubated and PCCM assumed care. PMH also includes OSA, DM 2, PE, factor 5 Leiden  MAJOR EVENTS/TEST RESULTS: 7/08 Admitted to telemetry by [facility] with dx of CAP 7/09 early AM: found by RN staf on floor, face down, bradycardic, apneic. Code blue called. Brief chest compressions. No meds. Intubated. Transferred to ICU.PCCM assumed care 7/09 CT head/neck: No evidence of intracranial injury or facial fracture. Left neck contusion without evidence of cervical spine injury 7/10 Persistent fever. Leukopenic. Abx expanded. Transitioned off insulin gtt to Lantus and SSI  INDWELLING DEVICES:: ETT 7/09 >>   MICRO DATA: Blood 7/08 >> NGTD Resp 7/09 >> NGTD U legionella 7/8 >> + for legionella RVP 7/11>> neg PCT 7/09: 0.74, 7/11: 6.53  ANTIMICROBIALS:  Ceftriaxone 7/08 >> 7/10 Azithro 7/08 >> 7/10>> restarted azithro 7/10>> Vanc 7/10 >> 7/12 Ceftaz 7/10 >>    SUBJ: Intubated, RASS -1  VITAL SIGNS: Temp:  [97.9 F (36.6 C)-101.3 F (38.5 C)] 99.2 F (37.3 C) (07/13 0700) Pulse Rate:  [72-106] 89 (07/13 0700) Resp:  [11-28] 21 (07/13 0700) BP: (98-173)/(52-80) 154/71 mmHg (07/13 0700) SpO2:  [97 %-100 %] 99 % (07/13 0700) FiO2 (%):  [40 %] 40 % (07/13 0700) HEMODYNAMICS:   VENTILATOR SETTINGS: Vent Mode:  [-] PRVC FiO2 (%):  [40 %] 40 % Set Rate:  [12 bmp] 12 bmp Vt Set:  [600 mL] 600 mL PEEP:  [5 cmH20] 5 cmH20 Pressure Support:  [5 cmH20-8 cmH20] 5 cmH20 Plateau Pressure:  [23 cmH20-26 cmH20] 26 cmH20 INTAKE / OUTPUT:  Intake/Output Summary (Last 24 hours) at 04/08/15 0816 Last data filed at 04/08/15 0600  Gross per 24 hour  Intake 1188.75 ml   Output   3600 ml  Net -2411.25 ml    PHYSICAL EXAMINATION: General:  NAD, RASS -1 Neuro:  No focal deficits HEENT: nasal contusion, cervical collar Cardiovascular:  RRR Lungs: few rhonchi, no wheezes Abdomen:  Obese, soft, +BS Ext: mild b/l LE edema, warm  LABS:  CBC  Recent Labs Lab 04/06/15 0646 04/07/15 0212 04/08/15 0220  WBC 1.1* 1.3* 0.9*  HGB 9.9* 9.8* 9.8*  HCT 29.8* 29.1* 28.9*  PLT 197 194 225   Coag's  Recent Labs Lab 04/06/15 0646 04/07/15 0212 04/08/15 0220  INR 3.19* 3.83* 2.74*   BMET  Recent Labs Lab 04/06/15 0646 04/07/15 0212 04/08/15 0220  NA 134* 134* 141  K 3.7 3.4* 3.2*  CL 105 106 109  CO2 19* 19* 22  BUN 25* 34* 42*  CREATININE 1.64* 1.70* 1.40*  GLUCOSE 244* 338* 252*   Electrolytes  Recent Labs Lab 04/06/15 0646 04/07/15 0212 04/08/15 0220  CALCIUM 8.4* 8.7* 9.4  MG  --   --  1.7  PHOS  --   --  2.4*   Sepsis Markers  Recent Labs Lab 04/22/2015 1346 03/29/2015 1558 04/08/2015 1924 04/04/15 0155 04/06/15 1116  LATICACIDVEN 2.19* 0.94 1.8  --   --   PROCALCITON  --   --   --  0.74 6.53   ABG  Recent Labs Lab 04/04/15 0205 04/04/15 0308 04/04/15 0522  PHART 7.018* 7.262* 7.295*  PCO2ART 62.3* 43.7 38.1  PO2ART 161* 248.0* 79.0*  Liver Enzymes  Recent Labs Lab 04/06/15 0646 04/07/15 0212 04/08/15 0220  AST 24 31 23   ALT 23 25 25   ALKPHOS 118 148* 141*  BILITOT 0.3 0.5 0.3  ALBUMIN 2.0* 1.9* 1.7*   Cardiac Enzymes  Recent Labs Lab 04/04/15 1131 04/05/15 0335  TROPONINI 0.13* 0.07*   Glucose  Recent Labs Lab 04/07/15 0759 04/07/15 1224 04/07/15 1552 04/07/15 2003 04/07/15 2356 04/08/15 0357  GLUCAP 301* 374* 282* 320* 268* 223*    7/11 CXR: NGT, ETT in good position, persistent low lung volumes, small left pleural effusion  ASSESSMENT / PLAN:  PULMONARY A: Acute respiratory failure  Likely 2/2 legionella pneomonia OSA - CPAP prior to admission  P:   Cont full vent support   Cont vent bundle Daily SBT if/when meets criteria  CARDIOVASCULAR A:  Hypertension, controlled S/p bradycardic arrest - suspect primary resp event  Cardiac markers negative P:  Monitor MAP goal > 65 mmHg Holding most home antihypertensive meds Cont metoprolol per tube  RENAL A:   AKI- likely ATN- improving Very mild hyponatremia P:   Monitor BMET intermittently Monitor I/Os Correct electrolytes as indicated  GASTROINTESTINAL A:   Loose stools - improved P:   SUP: enteral pantoprazole Initiate TFs 7/10 C diff neg  HEMATOLOGIC A:   Factor V Leiden with h/o PE Neutropenia, recurrent  Has documented history of this dating to 2013 INR improving P:  DVT px: warfarin per pharmacy Monitor CBC intermittently Transfuse per usual ICU guidelines   INFECTIOUS A:   CAP, NOS Severe sepsis 2/2 CAP legionella P:   Monitor temp, WBC count Follow PCT Micro and abx as above  ENDOCRINE A:   DM 2, poorly controlled P:   NPH 20 q6hr Moderate SSI  NEUROLOGIC A:   Acute encephalopathy Neck contusion by CT scan - no spine or cord injury noted P:   RASS goal: -1, -2 PAD protocol - propofol, PRN ativan Daily WUA Cont C collar Will need clinical clearance of C spine after extubation   FAMILY  - Update: wife and son updated @ bedside 7/12     Dewane Timson A. Kennon RoundsHaney MD, MS Family Medicine Resident PGY-2 Pager [phone]

## 2015-04-09 LAB — PROTIME-INR
INR: 2.05 — ABNORMAL HIGH (ref 0.00–1.49)
INR: 2.04 — AB (ref 0.00–1.49)
Prothrombin Time: 23 seconds — ABNORMAL HIGH (ref 11.6–15.2)
Prothrombin Time: 22.9 seconds — ABNORMAL HIGH (ref 11.6–15.2)

## 2015-04-09 LAB — GLUCOSE, CAPILLARY
Glucose-Capillary: 221 mg/dL — ABNORMAL HIGH (ref 65–99)
Glucose-Capillary: 244 mg/dL — ABNORMAL HIGH (ref 65–99)
Glucose-Capillary: 271 mg/dL — ABNORMAL HIGH (ref 65–99)

## 2015-04-09 LAB — CBC WITH DIFFERENTIAL/PLATELET
BASOS ABS: 0 10*3/uL (ref 0.0–0.1)
Basophils Relative: 3 % — ABNORMAL HIGH (ref 0–1)
Eosinophils Absolute: 0.2 10*3/uL (ref 0.0–0.7)
Eosinophils Relative: 18 % — ABNORMAL HIGH (ref 0–5)
HCT: 29.4 % — ABNORMAL LOW (ref 39.0–52.0)
Hemoglobin: 9.7 g/dL — ABNORMAL LOW (ref 13.0–17.0)
LYMPHS ABS: 0.4 10*3/uL — AB (ref 0.7–4.0)
Lymphocytes Relative: 30 % (ref 12–46)
MCH: 27.2 pg (ref 26.0–34.0)
MCHC: 33 g/dL (ref 30.0–36.0)
MCV: 82.6 fL (ref 78.0–100.0)
MONO ABS: 0 10*3/uL — AB (ref 0.1–1.0)
Monocytes Relative: 2 % — ABNORMAL LOW (ref 3–12)
NEUTROS ABS: 0.6 10*3/uL — AB (ref 1.7–7.7)
Neutrophils Relative %: 47 % (ref 43–77)
PLATELETS: 287 10*3/uL (ref 150–400)
RBC: 3.56 MIL/uL — AB (ref 4.22–5.81)
RDW: 15.9 % — AB (ref 11.5–15.5)
WBC: 1.2 10*3/uL — CL (ref 4.0–10.5)

## 2015-04-09 LAB — BASIC METABOLIC PANEL
Anion gap: 7 (ref 5–15)
Anion gap: 12 (ref 5–15)
BUN: 56 mg/dL — AB (ref 6–20)
BUN: 60 mg/dL — AB (ref 6–20)
CHLORIDE: 110 mmol/L (ref 101–111)
CO2: 25 mmol/L (ref 22–32)
CO2: 20 mmol/L — AB (ref 22–32)
CREATININE: 1.42 mg/dL — AB (ref 0.61–1.24)
Calcium: 9.9 mg/dL (ref 8.9–10.3)
Calcium: 10.2 mg/dL (ref 8.9–10.3)
Chloride: 111 mmol/L (ref 101–111)
Creatinine, Ser: 1.16 mg/dL (ref 0.61–1.24)
GFR calc Af Amer: >60 mL/min (ref >60)
GFR calc non Af Amer: 52 mL/min — ABNORMAL LOW (ref >60)
Glucose, Bld: 267 mg/dL — ABNORMAL HIGH (ref 65–99)
Glucose, Bld: 317 mg/dL — ABNORMAL HIGH (ref 65–99)
POTASSIUM: 3.3 mmol/L — AB (ref 3.5–5.1)
Potassium: 4.8 mmol/L (ref 3.5–5.1)
SODIUM: 143 mmol/L (ref 135–145)
Sodium: 142 mmol/L (ref 135–145)

## 2015-04-09 LAB — PHOSPHORUS: PHOSPHORUS: 3 mg/dL (ref 2.5–4.6)

## 2015-04-09 LAB — MAGNESIUM: Magnesium: 1.9 mg/dL (ref 1.7–2.4)

## 2015-04-09 LAB — HEPARIN LEVEL (UNFRACTIONATED)
Heparin Unfractionated: <0.1 IU/mL — ABNORMAL LOW (ref 0.30–0.70)
Heparin Unfractionated: <0.1 IU/mL — ABNORMAL LOW (ref 0.30–0.70)

## 2015-04-09 MED ORDER — AMLODIPINE BESYLATE 5 MG PO TABS
5.0000 mg | ORAL_TABLET | Freq: Every day | ORAL | Status: DC
  Administered 2015-04-09 – 2015-04-16 (×8): 5 mg via NASOGASTRIC
  Filled 2015-04-09 (×8): qty 1

## 2015-04-09 MED ORDER — AMLODIPINE BESYLATE 5 MG PO TABS: 5.0000 mg | ORAL_TABLET | Freq: Every day | ORAL | Status: DC

## 2015-04-09 MED ORDER — INSULIN NPH (HUMAN) (ISOPHANE) 100 UNIT/ML ~~LOC~~ SUSP
25.0000 [IU] | Freq: Four times a day (QID) | SUBCUTANEOUS | Status: DC
  Administered 2015-04-09 – 2015-04-10 (×4): 25 [IU] via SUBCUTANEOUS

## 2015-04-09 MED ORDER — POTASSIUM CHLORIDE 20 MEQ/15ML (10%) PO SOLN
20.0000 meq | ORAL | Status: AC
  Administered 2015-04-09 (×2): 20 meq
  Filled 2015-04-09 (×2): qty 15

## 2015-04-09 MED ORDER — HYDRALAZINE HCL 20 MG/ML IJ SOLN
10.0000 mg | INTRAMUSCULAR | Status: DC | PRN
  Administered 2015-04-09 – 2015-04-11 (×5): 10 mg via INTRAVENOUS
  Filled 2015-04-09 (×5): qty 1

## 2015-04-09 MED ORDER — HEPARIN (PORCINE) IN NACL 100-0.45 UNIT/ML-% IJ SOLN
3200.0000 [IU]/h | INTRAMUSCULAR | Status: DC
  Administered 2015-04-09: 1450 [IU]/h via INTRAVENOUS
  Administered 2015-04-10: 2200 [IU]/h via INTRAVENOUS
  Administered 2015-04-10: 2400 [IU]/h via INTRAVENOUS
  Administered 2015-04-10 – 2015-04-14 (×8): 2600 [IU]/h via INTRAVENOUS
  Administered 2015-04-16: 2900 [IU]/h via INTRAVENOUS
  Filled 2015-04-09 (×32): qty 250

## 2015-04-09 MED ORDER — LEVOFLOXACIN IN D5W 750 MG/150ML IV SOLN
750.0000 mg | INTRAVENOUS | Status: DC
  Administered 2015-04-09 – 2015-04-16 (×8): 750 mg via INTRAVENOUS
  Filled 2015-04-09 (×10): qty 150

## 2015-04-09 MED ORDER — FENTANYL CITRATE (PF) 2500 MCG/50ML IJ SOLN
0.0000 ug/h | INTRAMUSCULAR | Status: DC
  Administered 2015-04-09 – 2015-04-10 (×2): 100 ug/h via INTRAVENOUS
  Filled 2015-04-09 (×2): qty 50

## 2015-04-09 NOTE — Progress Notes (Signed)
ANTICOAGULATION CONSULT NOTE - Follow Up Consult  Pharmacy Consult for heparin Indication: Factor V Leiden and h/o PE   Labs:  Recent Labs  04/07/15 0212 04/08/15 0220 04/08/15 1615 04/09/15 0246 04/09/15 1540 04/09/15 1740 04/09/15 2307  HGB 9.8* 9.8*  --  9.7*  --   --   --   HCT 29.1* 28.9*  --  29.4*  --   --   --   PLT 194 225  --  287  --   --   --   LABPROT 36.8* 28.6*  --  23.0* 22.9*  --   --   INR 3.83* 2.74*  --  2.05* 2.04*  --   --   HEPARINUNFRC  --   --   --   --  <0.10*  --  <0.10*  CREATININE 1.70* 1.40* 1.44* 1.42*  --  1.16  --      Assessment: 64yo male remains undetectable on heparin after rate increase.  Goal of Therapy:  Heparin level 0.3-0.7 units/ml   Plan:  Will increase heparin gtt by 4 units/kgABW/hr to 2200 units/hr and check level in 6hr.  Vernard GamblesVeronda Yianna Tersigni, PharmD, BCPS  04/09/2015,11:52 PM

## 2015-04-09 NOTE — Progress Notes (Signed)
[facility]OTOC[facility] FOR AM LAB REPLACEMENT ONLY  The patient [facility] based on the criteria listed below:   1. Is GFR >/= 40 ml/min? Yes.    Patient's GFR today is 52 2. Is urine output >/= 0.5 ml/kg/hr for the last 6 hours? Yes.   Patient's UOP is 0.8 ml/kg/hr 3. Is BUN < 60 mg/dL? Yes.    Patient's BUN today is 56 4. Abnormal electrolyte(s): K3.3 5. Ordered repletion with: K3.3 6. If a panic level lab has been reported, has the CCM MD in charge been notified? Yes.  .   Physician:  Warren Lacy Alva,MD  Melrose NakayamaChisholm, Arlina Sabina William 04/09/2015 5:56 AM

## 2015-04-09 NOTE — Progress Notes (Signed)
PULMONARY / CRITICAL CARE MEDICINE   Name: [name] MRN: [id number] DOB: 04-Jul-1951    ADMISSION DATE:  13-Apr-2015 CONSULTATION DATE:  04/04/2015  REFERRING MD : Triad  CHIEF COMPLAINT:  AMS  INITIAL PRESENTATION:  64 y/o admitted for community acquired pneumonia was found down in hospital room with bradycardia and apnea. Intubated and PCCM assumed care. PMH also includes OSA, DM 2, PE, factor 5 Leiden  MAJOR EVENTS/TEST RESULTS: 7/08 Admitted to telemetry by [facility] with dx of CAP 7/09 early AM: found by RN staf on floor, face down, bradycardic, apneic. Code blue called. Brief chest compressions. No meds. Intubated. Transferred to ICU.PCCM assumed care 7/09 CT head/neck: No evidence of intracranial injury or facial fracture. Left neck contusion without evidence of cervical spine injury 7/10 Persistent fever. Leukopenic. Abx expanded. Transitioned off insulin gtt to Lantus and SSI  INDWELLING DEVICES:: ETT 7/09 >>   MICRO DATA: Blood 7/08 >> NGTD Resp 7/09 >> NGTD U legionella 7/8 >> + for legionella RVP 7/11>> neg PCT 7/09: 0.74, 7/11: 6.53  ANTIMICROBIALS:  Ceftriaxone 7/08 >> 7/10 Azithro 7/08 >> 7/10>> restarted azithro 7/10>> 7/14 Vanc 7/10 >> 7/12 Ceftaz 7/10 >> 7/14 levoquin 7/14>>   SUBJ: Intubated, RASS -4  VITAL SIGNS: Temp:  [98.1 F (36.7 C)-100.3 F (37.9 C)] 98.4 F (36.9 C) (07/14 0600) Pulse Rate:  [76-113] 90 (07/14 0800) Resp:  [12-42] 16 (07/14 0800) BP: (130-177)/(58-79) 167/77 mmHg (07/14 0800) SpO2:  [95 %-99 %] 99 % (07/14 0800) FiO2 (%):  [40 %] 40 % (07/14 0800) HEMODYNAMICS:   VENTILATOR SETTINGS: Vent Mode:  [-] PRVC FiO2 (%):  [40 %] 40 % Set Rate:  [12 bmp] 12 bmp Vt Set:  [600 mL] 600 mL Pressure Support:  [5 cmH20] 5 cmH20 Plateau Pressure:  [22 cmH20-26 cmH20] 26 cmH20 INTAKE / OUTPUT:  Intake/Output Summary (Last 24 hours) at 04/09/15 0814 Last data filed at 04/09/15 0600  Gross per 24 hour  Intake 1380.18 ml  Output    2850 ml  Net -1469.82 ml    PHYSICAL EXAMINATION: General:  NAD, RASS -4 Neuro:  No focal deficits HEENT: nasal contusion, cervical collar Cardiovascular:  RRR Lungs: few rhonchi, no wheezes Abdomen:  Obese, soft, +BS Ext: mild b/l LE edema, warm  LABS:  CBC  Recent Labs Lab 04/07/15 0212 04/08/15 0220 04/09/15 0246  WBC 1.3* 0.9* 1.2*  HGB 9.8* 9.8* 9.7*  HCT 29.1* 28.9* 29.4*  PLT 194 225 287   Coag's  Recent Labs Lab 04/07/15 0212 04/08/15 0220 04/09/15 0246  INR 3.83* 2.74* 2.05*   BMET  Recent Labs Lab 04/08/15 0220 04/08/15 1615 04/09/15 0246  NA 141 141 142  K 3.2* 3.4* 3.3*  CL 109 111 110  CO2 BUN 42* 49* 56*  CREATININE 1.40* 1.44* 1.42*  GLUCOSE 252* 246* 267*   Electrolytes  Recent Labs Lab 04/08/15 0220 04/08/15 1615 04/09/15 0246  CALCIUM 9.4 9.7 9.9  MG 1.7  --  1.9  PHOS 2.4*  --  3.0   Sepsis Markers  Recent Labs Lab 04/13/2015 1346 04/13/15 1558 2015/04/13 1924 04/04/15 0155 04/06/15 1116  LATICACIDVEN 2.19* 0.94 1.8  --   --   PROCALCITON  --   --   --  0.74 6.53   ABG  Recent Labs Lab 04/04/15 0205 04/04/15 0308 04/04/15 0522  PHART 7.018* 7.262* 7.295*  PCO2ART 62.3* 43.7 38.1  PO2ART 161* 248.0* 79.0*   Liver Enzymes  Recent Labs Lab 04/06/15  [id number] 04/07/15 0212 04/08/15 0220  AST 24 31 23   ALT 23 25 25   ALKPHOS 118 148* 141*  BILITOT 0.3 0.5 0.3  ALBUMIN 2.0* 1.9* 1.7*   Cardiac Enzymes  Recent Labs Lab 04/04/15 1131 04/05/15 0335  TROPONINI 0.13* 0.07*   Glucose  Recent Labs Lab 04/08/15 0742 04/08/15 1144 04/08/15 1550 04/08/15 1954 04/08/15 2351 04/09/15 0544  GLUCAP 221* 228* 238* 238* 244* 221*    7/11 CXR: NGT, ETT in good position, persistent low lung volumes, small left pleural effusion  ASSESSMENT / PLAN:  PULMONARY A: Acute respiratory failure  Likely 2/2 legionella pneumonia OSA - CPAP prior to admission  P:   Cont full vent support  Cont vent  bundle Daily SBT if/when meets criteria levalbuterol q6 hr  CARDIOVASCULAR A:  Hypertension, worsening S/p bradycardic arrest - suspect primary resp event  Cardiac markers negative P:  Monitor MAP goal > 65 mmHg Holding most home antihypertensive meds Cont metoprolol, lipitor, effient per tube Start norvasc per tube for HTN, hydral PRN  RENAL A:   AKI- likely ATN- improving Very mild hyponatremia P:   Monitor BMET intermittently Monitor I/Os Correct electrolytes as indicated  GASTROINTESTINAL A:   Loose stools - improved P:   SUP: enteral pantoprazole Initiate TFs 7/10 C diff neg  HEMATOLOGIC A:   Factor V Leiden with h/o PE, 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 Neutropenia, recurrent  Has documented history of this dating to 2013 INR improving P:  DVT ppx heparin per pharmacy, will hold warfarin Trend INR Monitor CBC intermittently Transfuse per usual ICU guidelines PICC line toorrow   INFECTIOUS A:   CAP, NOS Severe sepsis 2/2 CAP legionella P:   Monitor temp, WBC count Follow PCT Transition to levoquin 7/14  ENDOCRINE A:   DM 2, poorly controlled P:   NPH 20 q6hr Moderate SSI  NEUROLOGIC A:   Acute encephalopathy Neck contusion by CT scan - no spine or cord injury noted P:   RASS goal: -1, -2 PAD protocol - propofol, PRN ativan Daily WUA Cont C collar Will need clinical clearance of C spine after extubation   FAMILY  - Update: wife and son updated @ bedside 7/12     Analeise Mccleery A. Kennon RoundsHaney MD, MS Family Medicine Resident PGY-2 Pager [phone]

## 2015-04-09 NOTE — Progress Notes (Signed)
Inpatient Diabetes Program Recommendations  AACE/ADA: New Consensus Statement on Inpatient Glycemic Control (2013)  Target Ranges:  Prepandial:   less than 140 mg/dL      Peak postprandial:   less than 180 mg/dL (1-2 hours)      Critically ill patients:  140 - 180 mg/dL   Outpatient Diabetes medications: Levemir, Novolog Current orders for Inpatient glycemic control: NPH, Regular Recommend using the ICU Glycemic Control Protocol.  Basal bolus with Levemir and Novolog recommended instead of NPH and Regular.  Thank you  Piedad ClimesGina Genifer Lazenby BSN, RN,CDE Inpatient Diabetes Coordinator [phone] (team pager)

## 2015-04-09 NOTE — Progress Notes (Signed)
eLink Physician-Brief Progress Note Patient Name: [name] DOB: [birth year] MRN: [id number]   Date of Service  04/09/2015  HPI/Events of Note  Sedation needs TGs rising  eICU Interventions  Per care plan outlined - Goal RASS 0 Dc propofol Use fent gtt If remains agitated, use precedex gtt     Intervention Category Major Interventions: Delirium, psychosis, severe agitation - evaluation and management  Alasdair Kleve V. 04/09/2015, 12:14 AM

## 2015-04-09 NOTE — Progress Notes (Signed)
ANTICOAGULATION CONSULT NOTE - FOLLOW UP  Pharmacy Consult:  Heparin Indication: Factor V Leiden + history of PE  Allergies  Allergen Reactions  . Piroxicam Other (See Comments)    Blistering on hands  . Rofecoxib Other (See Comments)    REACTION: Reaction not known    Patient Measurements: Height: 5\' 11"  (180.3 cm) Weight: 281 lb 8.4 oz (127.7 kg) IBW/kg (Calculated) : 75.3 Heparin Dosing Weight: 103 kg  Vital Signs: Temp: 98.4 F (36.9 C) (07/14 0600) Temp Source: Core (Comment) (07/14 0000) BP: 150/67 mmHg (07/14 0600) Pulse Rate: 84 (07/14 0600)  Labs:  Recent Labs  04/07/15 0212 04/08/15 0220 04/08/15 1615 04/09/15 0246  HGB 9.8* 9.8*  --  9.7*  HCT 29.1* 28.9*  --  29.4*  PLT 194 225  --  287  LABPROT 36.8* 28.6*  --  23.0*  INR 3.83* 2.74*  --  2.05*  CREATININE 1.70* 1.40* 1.44* 1.42*    Estimated Creatinine Clearance: 74.4 mL/min (by C-G formula based on Cr of 1.42).     Assessment: [age] YOM with history of Factor V Leiden and PE presented with cough and fever from PCP office.  Patient's PTA Coumadin is held and he is to transition to IV heparin once INR falls below two.  INR currently therapeutic at 2.05 but expect it to decrease to sub-therapeutic level soon.  No bleeding reported.  CTX 7/9 >> 7/10 Azith 7/9 >> 7/10 Vanc 7/10 >> Fortaz 7/10 >> 7/14 Azith 7/11>> 7/14 LVQ 7/14 >>  7/8 BCx x2 - negative 7/9 TA - negative MRSA PCR neg 7/11 CDiff - neg 7/11 RVP - negative Strep Pneumo neg Legionella - positive   Goal of Therapy:  Heparin level 0.3-0.7 units/ml Monitor platelets by anticoagulation protocol: Yes    Plan:  - At 1000 today, start heparin gtt at 1450 units/hr, no bolus - Check 6 hr HL/INR - Daily HL / CBC / PT / INR   Marquise Wicke D. Laney Potashang, PharmD, BCPS Pager:  [phone] 04/09/2015, 10:42 AM

## 2015-04-09 NOTE — Progress Notes (Signed)
ANTICOAGULATION CONSULT NOTE - FOLLOW UP  Pharmacy Consult:  Heparin Indication: Factor V Leiden + history of PE Labs:  Recent Labs  04/07/15 0212 04/08/15 0220 04/08/15 1615 04/09/15 0246 04/09/15 1540  HGB 9.8* 9.8*  --  9.7*  --   HCT 29.1* 28.9*  --  29.4*  --   PLT 194 225  --  287  --   LABPROT 36.8* 28.6*  --  23.0* 22.9*  INR 3.83* 2.74*  --  2.05* 2.04*  HEPARINUNFRC  --   --   --   --  <0.10*  CREATININE 1.70* 1.40* 1.44* 1.42*  --     Estimated Creatinine Clearance: 74.4 mL/min (by C-G formula based on Cr of 1.42).  Assessment: [age] YOM with history of Factor V Leiden and PE presented with cough and fever from PCP office.  Patient's PTA Coumadin is held and he is to transition to IV heparin once INR falls below two.  INR currently therapeutic at 2.05 but expect it to decrease to sub-therapeutic level soon.  No bleeding reported.  HL <0.1, INR 2.04, will increase to 1750 units/hr, no bolus. Check HL in 6h  Goal of Therapy:  Heparin level 0.3-0.7 units/ml Monitor platelets by anticoagulation protocol: Yes  Plan:  - Increase heparin to 1750 units/hr, no bolus - Check 6 hr HL - Daily HL / CBC / PT / INR  Thank you for allowing pharmacy to be a part of this patients care team.  Lovenia KimJulie Marquasia Schmieder Pharm.D., BCPS, AQ-Cardiology Clinical Pharmacist 04/09/2015 5:00 PM Pager: [phone] Phone: [phone]) [phone]

## 2015-04-10 ENCOUNTER — Encounter (HOSPITAL_COMMUNITY): Payer: Self-pay | Admitting: *Deleted

## 2015-04-10 ENCOUNTER — Inpatient Hospital Stay (HOSPITAL_COMMUNITY): Payer: BLUE CROSS/BLUE SHIELD

## 2015-04-10 LAB — CBC WITH DIFFERENTIAL/PLATELET
BASOS ABS: 0 10*3/uL (ref 0.0–0.1)
Basophils Relative: 3 % — ABNORMAL HIGH (ref 0–1)
EOS PCT: 8 % — AB (ref 0–5)
Eosinophils Absolute: 0.1 10*3/uL (ref 0.0–0.7)
HCT: 32.3 % — ABNORMAL LOW (ref 39.0–52.0)
Hemoglobin: 10.6 g/dL — ABNORMAL LOW (ref 13.0–17.0)
Lymphocytes Relative: 26 % (ref 12–46)
Lymphs Abs: 0.3 10*3/uL — ABNORMAL LOW (ref 0.7–4.0)
MCH: 27.4 pg (ref 26.0–34.0)
MCHC: 32.8 g/dL (ref 30.0–36.0)
MCV: 83.5 fL (ref 78.0–100.0)
MONOS PCT: 3 % (ref 3–12)
Monocytes Absolute: 0 10*3/uL — ABNORMAL LOW (ref 0.1–1.0)
Neutro Abs: 0.8 10*3/uL — ABNORMAL LOW (ref 1.7–7.7)
Neutrophils Relative %: 60 % (ref 43–77)
PLATELETS: 322 10*3/uL (ref 150–400)
RBC: 3.87 MIL/uL — ABNORMAL LOW (ref 4.22–5.81)
RDW: 15.8 % — ABNORMAL HIGH (ref 11.5–15.5)
WBC: 1.2 10*3/uL — AB (ref 4.0–10.5)

## 2015-04-10 LAB — BASIC METABOLIC PANEL
Anion gap: 9 (ref 5–15)
BUN: 69 mg/dL — AB (ref 6–20)
CO2: 25 mmol/L (ref 22–32)
CREATININE: 1.29 mg/dL — AB (ref 0.61–1.24)
Calcium: 10.6 mg/dL — ABNORMAL HIGH (ref 8.9–10.3)
Chloride: 111 mmol/L (ref 101–111)
GFR calc non Af Amer: 58 mL/min — ABNORMAL LOW (ref >60)
Glucose, Bld: 380 mg/dL — ABNORMAL HIGH (ref 65–99)
POTASSIUM: 3.7 mmol/L (ref 3.5–5.1)
SODIUM: 145 mmol/L (ref 135–145)

## 2015-04-10 LAB — PROTIME-INR
INR: 1.89 — ABNORMAL HIGH (ref 0.00–1.49)
Prothrombin Time: 21.6 seconds — ABNORMAL HIGH (ref 11.6–15.2)

## 2015-04-10 LAB — HEPARIN LEVEL (UNFRACTIONATED)
HEPARIN UNFRACTIONATED: 0.24 [IU]/mL — AB (ref 0.30–0.70)
Heparin Unfractionated: <0.1 IU/mL — ABNORMAL LOW (ref 0.30–0.70)

## 2015-04-10 LAB — LEGIONELLA ANTIGEN, URINE

## 2015-04-10 LAB — GLUCOSE, CAPILLARY
GLUCOSE-CAPILLARY: 315 mg/dL — AB (ref 65–99)
Glucose-Capillary: 288 mg/dL — ABNORMAL HIGH (ref 65–99)
Glucose-Capillary: 298 mg/dL — ABNORMAL HIGH (ref 65–99)
Glucose-Capillary: 336 mg/dL — ABNORMAL HIGH (ref 65–99)
Glucose-Capillary: 319 mg/dL — ABNORMAL HIGH (ref 65–99)
Glucose-Capillary: 240 mg/dL — ABNORMAL HIGH (ref 65–99)
Glucose-Capillary: 322 mg/dL — ABNORMAL HIGH (ref 65–99)

## 2015-04-10 LAB — PHOSPHORUS: Phosphorus: 2.7 mg/dL (ref 2.5–4.6)

## 2015-04-10 LAB — MAGNESIUM: MAGNESIUM: 1.8 mg/dL (ref 1.7–2.4)

## 2015-04-10 MED ORDER — DEXTROSE 50 % IV SOLN: 25.0000 mL | INTRAVENOUS | Status: DC | PRN

## 2015-04-10 MED ORDER — SODIUM CHLORIDE 0.9 % IV SOLN
INTRAVENOUS | Status: DC
  Administered 2015-04-10: 2.6 [IU]/h via INTRAVENOUS
  Administered 2015-04-11: 15.6 [IU]/h via INTRAVENOUS
  Administered 2015-04-11: 22 [IU]/h via INTRAVENOUS
  Administered 2015-04-11: 17.8 [IU]/h via INTRAVENOUS
  Administered 2015-04-11: 16 [IU]/h via INTRAVENOUS
  Administered 2015-04-12: 15.7 [IU]/h via INTRAVENOUS
  Administered 2015-04-13: 19.6 [IU]/h via INTRAVENOUS
  Administered 2015-04-13: 17.4 [IU]/h via INTRAVENOUS
  Administered 2015-04-14: 11.9 [IU]/h via INTRAVENOUS
  Filled 2015-04-10 (×7): qty 2.5

## 2015-04-10 MED ORDER — SODIUM CHLORIDE 0.9 % IJ SOLN: 10.0000 mL | INTRAMUSCULAR | Status: DC | PRN

## 2015-04-10 MED ORDER — FENTANYL CITRATE (PF) 100 MCG/2ML IJ SOLN
25.0000 ug | INTRAMUSCULAR | Status: DC | PRN
  Administered 2015-04-10 – 2015-04-11 (×4): 50 ug via INTRAVENOUS
  Filled 2015-04-10 (×4): qty 2

## 2015-04-10 MED ORDER — INSULIN REGULAR BOLUS VIA INFUSION
0.0000 [IU] | Freq: Three times a day (TID) | INTRAVENOUS | Status: DC
  Filled 2015-04-10: qty 10

## 2015-04-10 MED ORDER — HEPARIN BOLUS VIA INFUSION
3000.0000 [IU] | Freq: Once | INTRAVENOUS | Status: AC
  Administered 2015-04-10: 3000 [IU] via INTRAVENOUS
  Filled 2015-04-10: qty 3000

## 2015-04-10 MED ORDER — INSULIN NPH (HUMAN) (ISOPHANE) 100 UNIT/ML ~~LOC~~ SUSP
30.0000 [IU] | Freq: Four times a day (QID) | SUBCUTANEOUS | Status: DC
  Administered 2015-04-10: 30 [IU] via SUBCUTANEOUS

## 2015-04-10 MED ORDER — SODIUM CHLORIDE 0.9 % IJ SOLN
10.0000 mL | Freq: Two times a day (BID) | INTRAMUSCULAR | Status: DC
  Administered 2015-04-10 (×2): 10 mL
  Administered 2015-04-11: 30 mL
  Administered 2015-04-12 – 2015-04-16 (×8): 10 mL

## 2015-04-10 MED ORDER — SODIUM CHLORIDE 0.9 % IV SOLN
INTRAVENOUS | Status: DC
  Administered 2015-04-10: 19:00:00 via INTRAVENOUS

## 2015-04-10 MED ORDER — DEXTROSE-NACL 5-0.45 % IV SOLN
INTRAVENOUS | Status: DC
  Administered 2015-04-11: 05:00:00 via INTRAVENOUS

## 2015-04-10 NOTE — Progress Notes (Signed)
ANTICOAGULATION CONSULT NOTE - FOLLOW UP  Pharmacy Consult:  Heparin Indication: Factor V Leiden + history of PE  Allergies  Allergen Reactions  . Piroxicam Other (See Comments)    Blistering on hands  . Rofecoxib Other (See Comments)    REACTION: Reaction not known    Patient Measurements: Height: 5\' 11"  (180.3 cm) Weight: 273 lb 2.4 oz (123.9 kg) IBW/kg (Calculated) : 75.3 Heparin Dosing Weight: 103 kg  Vital Signs: Temp: 100.9 F (38.3 C) (07/15 1900) BP: 182/59 mmHg (07/15 2005) Pulse Rate: 132 (07/15 2005)  Labs:  Recent Labs  04/08/15 0220  04/09/15 0246 04/09/15 1540 04/09/15 1740  04/10/15 0246 04/10/15 1230 04/10/15 2020  HGB 9.8*  --  9.7*  --   --   --  10.6*  --   --   HCT 28.9*  --  29.4*  --   --   --  32.3*  --   --   PLT 225  --  287  --   --   --  322  --   --   LABPROT 28.6*  --  23.0* 22.9*  --   --  21.6*  --   --   INR 2.74*  --  2.05* 2.04*  --   --  1.89*  --   --   HEPARINUNFRC  --   --   --  <0.10*  --   < > <0.10* <0.10* 0.24*  CREATININE 1.40*  < > 1.42*  --  1.16  --  1.29*  --   --   < > = values in this interval not displayed.  Estimated Creatinine Clearance: 80.5 mL/min (by C-G formula based on Cr of 1.29).     Assessment: [age] YOM admitted 04/11/2015  with history of Factor V Leiden and PE presented with cough and fever from PCP office.   AC: Coumadin for hx factor V Leiden + hx PE, INR 1.51 on admit (5mg  daily except 7.5mg  on Wed) >> hold warfarin while in ICU (last dose 7/10) and convert to heparin. On  2400 units/hr heparin level remains low.  Goal of Therapy:  Heparin level 0.3-0.7 units/ml Monitor platelets by anticoagulation protocol: Yes  Plan:  - Increase heparin infusion to 2600 units/hr  - Check 6 hr HL/INR, with am labbs - Daily HL / CBC - Monitor for s/sx of bleeding  Thank you for allowing pharmacy to be a part of this patients care team.  Lovenia KimJulie Kelbi Renstrom Pharm.D., BCPS, AQ-Cardiology Clinical  Pharmacist 04/10/2015 9:14 PM Pager: [phone]) [phone] Phone: [phone]) [phone]

## 2015-04-10 NOTE — Progress Notes (Signed)
Peripherally Inserted Central Catheter/Midline Placement  The IV Nurse has discussed with the patient and/or persons authorized to consent for the patient, the purpose of this procedure and the potential benefits and risks involved with this procedure.  The benefits include less needle sticks, lab draws from the catheter and patient may be discharged home with the catheter.  Risks include, but not limited to, infection, bleeding, blood clot (thrombus formation), and puncture of an artery; nerve damage and irregular heat beat.  Alternatives to this procedure were also discussed.  Consent signed by wife.  PICC/Midline Placement Documentation  PICC / Midline Double Lumen 04/10/15 PICC Right Basilic 43 cm 0 cm (Active)  Indication for Insertion or Continuance of Line Poor Vasculature-patient has had multiple peripheral attempts or PIVs lasting less than 24 hours 04/10/2015 11:31 AM  Exposed Catheter (cm) 0 cm 04/10/2015 11:31 AM  Site Assessment Clean;Dry;Intact 04/10/2015 11:31 AM  Lumen #1 Status Flushed;Saline locked;Blood return noted 04/10/2015 11:31 AM  Lumen #2 Status Flushed;Saline locked;Blood return noted 04/10/2015 11:31 AM  Dressing Type Transparent 04/10/2015 11:31 AM  Dressing Change Due 04/17/15 04/10/2015 11:31 AM       Ethelda Chickurrie, Tiffony Kite Robert 04/10/2015, 11:32 AM

## 2015-04-10 NOTE — Progress Notes (Addendum)
Inpatient Diabetes Program Recommendations  AACE/ADA: New Consensus Statement on Inpatient Glycemic Control (2013)  Target Ranges:  Prepandial:   less than 140 mg/dL      Peak postprandial:   less than 180 mg/dL (1-2 hours)      Critically ill patients:  140 - 180 mg/dL   Results for [name], [name] (MRN [id number]) as of 04/10/2015 08:56  Ref. Range 04/09/2015 05:44 04/09/2015 11:54 04/09/2015 17:54 04/10/2015 00:04 04/10/2015 06:01  Glucose-Capillary Latest Ref Range: 65-99 mg/dL 413221 (H) 244271 (H) 010288 (H) 336 (H) 298 (H)    Diabetes history: DM2 Outpatient Diabetes medications: Levemir 60 units BID, Novolog 44 units TID with meals Current orders for Inpatient glycemic control: NPH 30 units Q6H, Novolin 0-15 units Q6H  Inpatient Diabetes Program Recommendations Insulin - Basal: Currently ordered NPH 30 units Q6H (just increased this morning). Over the past 24 hours patient has received a total of NPH 120 units and Novolin 40 units for correction. Despite high doses of insulin received, glucose has ranged from 221-336 mg/dl over the past 24 hours.  At this time, strongly recommend discontinuing current regular Glycemic Control order set and order ICU Glycemic Control order set Phase 2 (IV insulin) and when appropriate to transition back to SQ insulin recommend ordering Lantus and Novolog.   Thanks, Orlando PennerMarie Gulianna Hornsby, RN, MSN, CCRN, CDE Diabetes Coordinator Inpatient Diabetes Program [phone] (Team Pager from 8am to 5pm) [phone] ([facility] office) [phone] [facility] office) [phone] [facility] office)

## 2015-04-10 NOTE — Progress Notes (Signed)
ANTICOAGULATION CONSULT NOTE - FOLLOW UP  Pharmacy Consult:  Heparin Indication: Factor V Leiden + history of PE  Allergies  Allergen Reactions  . Piroxicam Other (See Comments)    Blistering on hands  . Rofecoxib Other (See Comments)    REACTION: Reaction not known    Patient Measurements: Height: 5\' 11"  (180.3 cm) Weight: 273 lb 2.4 oz (123.9 kg) IBW/kg (Calculated) : 75.3 Heparin Dosing Weight: 103 kg  Vital Signs: Temp: 100.7 F (38.2 C) (07/15 1300) Temp Source: Core (Comment) (07/15 0400) BP: 189/82 mmHg (07/15 1300) Pulse Rate: 125 (07/15 1300)  Labs:  Recent Labs  04/08/15 0220  04/09/15 0246  04/09/15 1540 04/09/15 1740 04/09/15 2307 04/10/15 0246 04/10/15 1230  HGB 9.8*  --  9.7*  --   --   --   --  10.6*  --   HCT 28.9*  --  29.4*  --   --   --   --  32.3*  --   PLT 225  --  287  --   --   --   --  322  --   LABPROT 28.6*  --  23.0*  --  22.9*  --   --  21.6*  --   INR 2.74*  --  2.05*  --  2.04*  --   --  1.89*  --   HEPARINUNFRC  --   --   --   < > <0.10*  --  <0.10* <0.10* <0.10*  CREATININE 1.40*  < > 1.42*  --   --  1.16  --  1.29*  --   < > = values in this interval not displayed.  Estimated Creatinine Clearance: 80.5 mL/min (by C-G formula based on Cr of 1.29).     Assessment: [age] YOM with history of Factor V Leiden and PE presented with cough and fever from PCP office.   AC: Coumadin for hx factor V Leiden + hx PE, INR 1.51 on admit (5mg  daily except 7.5mg  on Wed) >> hold warfarin while in ICU (last dose 7/10) and convert to heparin. Previously on 2200 units/hr with an undetectable HL. Heparin was held for the placement of a PICC LIne, which was placed earlier today  Goal of Therapy:  Heparin level 0.3-0.7 units/ml Monitor platelets by anticoagulation protocol: Yes    Plan:  - Bolus 3000 units of heparin - Initiate infusion at 2400 units/hr (since pt was subtherapeutic on previous dosing) - Check 6 hr HL/INR - Daily HL / CBC -  Monitor for s/sx of bleeding  Isaac BlissMichael Levette Paulick, PharmD, BCPS Clinical Pharmacist Pager [phone] 04/10/2015 1:44 PM

## 2015-04-10 NOTE — Progress Notes (Signed)
Attempted wean 8/5 tolerated about 30 min. RR 30, inc WOB placed back in full support

## 2015-04-10 NOTE — Progress Notes (Signed)
PULMONARY / CRITICAL CARE MEDICINE   Name: [name] MRN: [id number] DOB: November 18, 1950    ADMISSION DATE:  04/23/2015 CONSULTATION DATE:  04/04/2015  REFERRING MD : Triad  CHIEF COMPLAINT:  AMS  INITIAL PRESENTATION:  64 y/o admitted for community acquired pneumonia was found down in hospital room with bradycardia and apnea. Intubated and PCCM assumed care. PMH also includes OSA, DM 2, PE, factor 5 Leiden  MAJOR EVENTS/TEST RESULTS: 7/08 [facility] 7/09 early AM: found by RN staf on floor, face down, bradycardic, apneic. Code blue called. Brief chest compressions. No meds. Intubated. Transferred to ICU.PCCM assumed care 7/09 CT head/neck: No evidence of intracranial injury or facial fracture. Left neck contusion without evidence of cervical spine injury 7/10 Persistent fever. Leukopenic. Abx expanded. Transitioned off insulin gtt to Lantus and SSI  INDWELLING DEVICES:: ETT 7/09 >>   MICRO DATA: Blood 7/08 >> NGTD Resp 7/09 >> NGTD U legionella 7/8 >> + for legionella RVP 7/11>> neg PCT 7/09: 0.74, 7/11: 6.53  ANTIMICROBIALS:  Ceftriaxone 7/08 >> 7/10 Azithro 7/08 >> 7/10>> restarted azithro 7/10>> 7/14 Vanc 7/10 >> 7/12 Ceftaz 7/10 >> 7/14 levoquin 7/14>>   SUBJ: Intubated, RASS -4 , continued hypertension overnight VITAL SIGNS: Temp:  [98.3 F (36.8 C)-101.1 F (38.4 C)] 99.1 F (37.3 C) (07/15 0600) Pulse Rate:  [80-115] 100 (07/15 0600) Resp:  [16-24] 20 (07/15 0600) BP: (121-184)/(56-78) 162/74 mmHg (07/15 0600) SpO2:  [92 %-100 %] 94 % (07/15 0600) FiO2 (%):  [30 %-40 %] 30 % (07/15 0400) Weight:  [273 lb 2.4 oz (123.9 kg)] 273 lb 2.4 oz (123.9 kg) (07/15 0500) HEMODYNAMICS:   VENTILATOR SETTINGS: Vent Mode:  [-] PRVC FiO2 (%):  [30 %-40 %] 30 % Set Rate:  [12 bmp] 12 bmp Vt Set:  [600 mL] 600 mL Pressure Support:  [5 cmH20] 5 cmH20 Plateau Pressure:  [21 cmH20-26 cmH20] 26 cmH20 INTAKE / OUTPUT:  Intake/Output  Summary (Last 24 hours) at 04/10/15 0717 Last data filed at 04/10/15 0600  Gross per 24 hour  Intake 1548.78 ml  Output   4676 ml  Net -3127.22 ml    PHYSICAL EXAMINATION: General:  NAD, RASS -4 Neuro:  No focal deficits HEENT: nasal contusion, cervical collar Cardiovascular:  RRR Lungs: few rhonchi, no wheezes Abdomen:  Obese, soft, +BS Ext: mild b/l LE edema, warm  LABS:  CBC  Recent Labs Lab 04/08/15 0220 04/09/15 0246 04/10/15 0246  WBC 0.9* 1.2* 1.2*  HGB 9.8* 9.7* 10.6*  HCT 28.9* 29.4* 32.3*  PLT 225 287 322   Coag's  Recent Labs Lab 04/09/15 0246 04/09/15 1540 04/10/15 0246  INR 2.05* 2.04* 1.89*   BMET  Recent Labs Lab 04/09/15 0246 04/09/15 1740 04/10/15 0246  NA 142 143 145  K 3.3* 4.8 3.7  CL 110 111 111  CO2 25 20* 25  BUN 56* 60* 69*  CREATININE 1.42* 1.16 1.29*  GLUCOSE 267* 317* 380*   Electrolytes  Recent Labs Lab 04/08/15 0220  04/09/15 0246 04/09/15 1740 04/10/15 0246  CALCIUM 9.4  < > 9.9 10.2 10.6*  MG 1.7  --  1.9  --  1.8  PHOS 2.4*  --  3.0  --  2.7  < > = values in this interval not displayed. Sepsis Markers  Recent Labs Lab 03/31/2015 1346 04/12/2015 1558 04/10/2015 1924 04/04/15 0155 04/06/15 1116  LATICACIDVEN 2.19* 0.94 1.8  --   --   PROCALCITON  --   --   --  0.74 6.53   ABG  Recent Labs Lab 04/04/15 0205 04/04/15 0308 04/04/15 0522  PHART 7.018* 7.262* 7.295*  PCO2ART 62.3* 43.7 38.1  PO2ART 161* 248.0* 79.0*   Liver Enzymes  Recent Labs Lab 04/06/15 0646 04/07/15 0212 04/08/15 0220  AST ALT ALKPHOS 118 148* 141*  BILITOT 0.3 0.5 0.3  ALBUMIN 2.0* 1.9* 1.7*   Cardiac Enzymes  Recent Labs Lab 04/04/15 1131 04/05/15 0335  TROPONINI 0.13* 0.07*   Glucose  Recent Labs Lab 04/08/15 2351 04/09/15 0544 04/09/15 1154 04/09/15 1754 04/10/15 0004 04/10/15 0601  GLUCAP 244* 221* 271* 288* 336* 298*    7/11 CXR: NGT, ETT in good position, persistent low lung  volumes, small left pleural effusion  ASSESSMENT / PLAN:  PULMONARY A: Acute respiratory failure  Likely 2/2 legionella pneumonia OSA - CPAP prior to admission  P:   Cont full vent support  Cont vent bundle Daily SBT if/when meets criteria levalbuterol q6 hr  CARDIOVASCULAR A:  Hypertension, worsening S/p bradycardic arrest - suspect primary resp event  Cardiac markers negative P:  Monitor MAP goal > 65 mmHg Holding most home antihypertensive meds Cont metoprolol, lipitor, effient per tube Start norvasc per tube for HTN, hydral PRN  RENAL A:   AKI- likely ATN- improving Very mild hyponatremia P:   Monitor BMET intermittently Monitor I/Os Correct electrolytes as indicated  GASTROINTESTINAL A:   Loose stools - improved P:   SUP: enteral pantoprazole Initiate TFs 7/10 C diff neg  HEMATOLOGIC A:   Factor V Leiden with h/o PE, 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 Neutropenia, recurrent  Has documented history of this dating to 2013 INR improving P:  DVT ppx heparin per pharmacy, will hold warfarin Trend INR Monitor CBC Transfuse per usual ICU guidelines PICC line today   INFECTIOUS A:   CAP, NOS Severe sepsis 2/2 CAP legionella P:   Monitor temp, WBC count Follow PCT Transition to levoquin 7/14  ENDOCRINE A:   DM 2, poorly controlled P:   NPH 25 q6hr,increased to 30 q6 hr today 7/15 Insulin gtt after PICC placement today Moderate SSI  NEUROLOGIC A:   Acute encephalopathy Neck contusion by CT scan - no spine or cord injury noted  P:   RASS goal: -1, -2 PAD protocol - propofol, PRN ativan Daily WUA Cont C collar Will need clinical clearance of C spine after extubation   FAMILY  - Update: wife and son updated @ bedside 7/12     Imraan Wendell A. Kennon Rounds MD, MS Family Medicine Resident PGY-2 Pager [phone]

## 2015-04-11 DIAGNOSIS — A481 Legionnaires' disease: Secondary | ICD-10-CM

## 2015-04-11 DIAGNOSIS — D6851 Activated protein C resistance: Secondary | ICD-10-CM

## 2015-04-11 LAB — BASIC METABOLIC PANEL
Anion gap: 10 (ref 5–15)
Anion gap: 10 (ref 5–15)
BUN: 86 mg/dL — ABNORMAL HIGH (ref 6–20)
BUN: 82 mg/dL — ABNORMAL HIGH (ref 6–20)
CHLORIDE: 117 mmol/L — AB (ref 101–111)
CHLORIDE: 118 mmol/L — AB (ref 101–111)
CO2: 28 mmol/L (ref 22–32)
CO2: 28 mmol/L (ref 22–32)
Calcium: 11.5 mg/dL — ABNORMAL HIGH (ref 8.9–10.3)
Calcium: 11.7 mg/dL — ABNORMAL HIGH (ref 8.9–10.3)
Creatinine, Ser: 1.58 mg/dL — ABNORMAL HIGH (ref 0.61–1.24)
Creatinine, Ser: 1.38 mg/dL — ABNORMAL HIGH (ref 0.61–1.24)
GFR calc Af Amer: 53 mL/min — ABNORMAL LOW (ref >60)
GFR calc Af Amer: >60 mL/min (ref >60)
GFR calc non Af Amer: 46 mL/min — ABNORMAL LOW (ref >60)
GFR calc non Af Amer: 54 mL/min — ABNORMAL LOW (ref >60)
GLUCOSE: 218 mg/dL — AB (ref 65–99)
Glucose, Bld: 217 mg/dL — ABNORMAL HIGH (ref 65–99)
POTASSIUM: 3.5 mmol/L (ref 3.5–5.1)
Potassium: 3.2 mmol/L — ABNORMAL LOW (ref 3.5–5.1)
SODIUM: 155 mmol/L — AB (ref 135–145)
SODIUM: 156 mmol/L — AB (ref 135–145)

## 2015-04-11 LAB — CBC
HCT: 33.9 % — ABNORMAL LOW (ref 39.0–52.0)
HEMOGLOBIN: 10.5 g/dL — AB (ref 13.0–17.0)
MCH: 26.5 pg (ref 26.0–34.0)
MCHC: 31 g/dL (ref 30.0–36.0)
MCV: 85.6 fL (ref 78.0–100.0)
PLATELETS: 392 10*3/uL (ref 150–400)
RBC: 3.96 MIL/uL — ABNORMAL LOW (ref 4.22–5.81)
RDW: 16.2 % — ABNORMAL HIGH (ref 11.5–15.5)
WBC: 1.6 10*3/uL — ABNORMAL LOW (ref 4.0–10.5)

## 2015-04-11 LAB — URINALYSIS, ROUTINE W REFLEX MICROSCOPIC
BILIRUBIN URINE: NEGATIVE
Glucose, UA: NEGATIVE mg/dL
Ketones, ur: NEGATIVE mg/dL
Leukocytes, UA: NEGATIVE
Nitrite: NEGATIVE
PROTEIN: 30 mg/dL — AB
SPECIFIC GRAVITY, URINE: 1.015 (ref 1.005–1.030)
UROBILINOGEN UA: 0.2 mg/dL (ref 0.0–1.0)
pH: 5.5 (ref 5.0–8.0)

## 2015-04-11 LAB — HEPARIN LEVEL (UNFRACTIONATED): HEPARIN UNFRACTIONATED: 0.36 [IU]/mL (ref 0.30–0.70)

## 2015-04-11 LAB — GLUCOSE, CAPILLARY
GLUCOSE-CAPILLARY: 316 mg/dL — AB (ref 65–99)
GLUCOSE-CAPILLARY: 370 mg/dL — AB (ref 65–99)
GLUCOSE-CAPILLARY: 243 mg/dL — AB (ref 65–99)
GLUCOSE-CAPILLARY: 221 mg/dL — AB (ref 65–99)
GLUCOSE-CAPILLARY: 179 mg/dL — AB (ref 65–99)
GLUCOSE-CAPILLARY: 268 mg/dL — AB (ref 65–99)
GLUCOSE-CAPILLARY: 217 mg/dL — AB (ref 65–99)
GLUCOSE-CAPILLARY: 179 mg/dL — AB (ref 65–99)
Glucose-Capillary: 197 mg/dL — ABNORMAL HIGH (ref 65–99)
Glucose-Capillary: 295 mg/dL — ABNORMAL HIGH (ref 65–99)
Glucose-Capillary: 360 mg/dL — ABNORMAL HIGH (ref 65–99)
Glucose-Capillary: 366 mg/dL — ABNORMAL HIGH (ref 65–99)
Glucose-Capillary: 384 mg/dL — ABNORMAL HIGH (ref 65–99)
Glucose-Capillary: 182 mg/dL — ABNORMAL HIGH (ref 65–99)
Glucose-Capillary: 163 mg/dL — ABNORMAL HIGH (ref 65–99)
Glucose-Capillary: 164 mg/dL — ABNORMAL HIGH (ref 65–99)
Glucose-Capillary: 225 mg/dL — ABNORMAL HIGH (ref 65–99)
Glucose-Capillary: 193 mg/dL — ABNORMAL HIGH (ref 65–99)
Glucose-Capillary: 197 mg/dL — ABNORMAL HIGH (ref 65–99)
Glucose-Capillary: 205 mg/dL — ABNORMAL HIGH (ref 65–99)

## 2015-04-11 LAB — URINE MICROSCOPIC-ADD ON

## 2015-04-11 LAB — MAGNESIUM: MAGNESIUM: 1.9 mg/dL (ref 1.7–2.4)

## 2015-04-11 LAB — PHOSPHORUS: PHOSPHORUS: 3.4 mg/dL (ref 2.5–4.6)

## 2015-04-11 MED ORDER — FENTANYL CITRATE (PF) 100 MCG/2ML IJ SOLN
50.0000 ug | Freq: Once | INTRAMUSCULAR | Status: AC
  Administered 2015-04-11: 50 ug via INTRAVENOUS

## 2015-04-11 MED ORDER — DEXTROSE 5 % IV SOLN
INTRAVENOUS | Status: DC
  Administered 2015-04-11: 75 mL via INTRAVENOUS
  Administered 2015-04-12: 20:00:00 via INTRAVENOUS
  Administered 2015-04-13: 75 mL via INTRAVENOUS

## 2015-04-11 MED ORDER — FREE WATER
200.0000 mL | Freq: Four times a day (QID) | Status: DC
Start: 2015-04-11 — End: 2015-04-12
  Administered 2015-04-11 – 2015-04-12 (×4): 200 mL

## 2015-04-11 MED ORDER — POTASSIUM CHLORIDE 10 MEQ/50ML IV SOLN
10.0000 meq | INTRAVENOUS | Status: AC
  Administered 2015-04-11 (×4): 10 meq via INTRAVENOUS
  Filled 2015-04-11 (×4): qty 50

## 2015-04-11 MED ORDER — MAGNESIUM SULFATE IN D5W 10-5 MG/ML-% IV SOLN
1.0000 g | Freq: Once | INTRAVENOUS | Status: AC
  Administered 2015-04-11: 1 g via INTRAVENOUS
  Filled 2015-04-11: qty 100

## 2015-04-11 MED ORDER — SODIUM CHLORIDE 0.9 % IV SOLN
25.0000 ug/h | INTRAVENOUS | Status: DC
  Administered 2015-04-11: 50 ug/h via INTRAVENOUS
  Administered 2015-04-11: 100 ug/h via INTRAVENOUS
  Administered 2015-04-12: 50 ug/h via INTRAVENOUS
  Administered 2015-04-13: 100 ug/h via INTRAVENOUS
  Filled 2015-04-11 (×3): qty 50

## 2015-04-11 MED ORDER — FENTANYL BOLUS VIA INFUSION
50.0000 ug | INTRAVENOUS | Status: DC | PRN
  Administered 2015-04-11 – 2015-04-12 (×2): 50 ug via INTRAVENOUS
  Filled 2015-04-11: qty 50

## 2015-04-11 NOTE — Progress Notes (Signed)
Pt heart rhythm became irregular on monitor and upon ausculation; e-link Dr Vaughan BastaSummer notified, new orders placed.

## 2015-04-11 NOTE — Progress Notes (Signed)
Called by the RN, because pt PIP were increasing to the mid 50s. Pt was assessed, lavage, and manual bag ventilated  for . Pt was stable and O2sats were good throughout.  Pt had thick bloody tan secretions coming out of the expiratory valve of the ambu bag were being manually ventilated. Pt back on ventilator and was ETT inline suction and returned back thick tan bloody secretions. PIP are now at the mid 30's.  Scheduled Neb was given afterwards. No complications noted. RN aware. RT will continue to monitor.

## 2015-04-11 NOTE — Progress Notes (Signed)
eLink Physician-Brief Progress Note Patient Name: [name] DOB: 12-04-1950 MRN: [id number]   Date of Service  04/11/2015  HPI/Events of Note  Frequent PVC's and PAC's. K+ = 3.2, Mg++ = 1.9 and Creatinine = 1.38.  eICU Interventions  Will order: 1. Replete K+ and Mg++. 2. Repeat BMP and Mg++ at 11 PM.      Intervention Category Major Interventions: Arrhythmia - evaluation and management  [name] 04/11/2015, 5:06 PM

## 2015-04-11 NOTE — Progress Notes (Signed)
WUA - sedation turned off, pt begin to belly breathe and breath over the vent, bp and heart increased, sedation meds resumed at 50%

## 2015-04-11 NOTE — Progress Notes (Signed)
This rn provided update to Dr Sherene SiresWert, [facility]

## 2015-04-11 NOTE — Progress Notes (Signed)
PULMONARY / CRITICAL CARE MEDICINE   Name: [name] MRN: [id number] DOB: 1950-12-26    ADMISSION DATE:  May 02, 2015 CONSULTATION DATE:  04/04/2015  REFERRING MD : [facility]  CHIEF COMPLAINT:  AMS  INITIAL PRESENTATION:  64 y/o admitted for community acquired pneumonia was found down in hospital room with bradycardia and apnea. Intubated and PCCM assumed care. PMH includes OSA, DM 2, PE, factor 5 Leiden   MAJOR EVENTS/TEST RESULTS: 7/08  Admitted to telemetry by [facility] with dx of CAP 7/09  early AM: found by RN staf on floor, face down, bradycardic, apneic. Code blue called. Brief chest compressions. No meds. Intubated. Transferred to ICU.PCCM assumed care 7/09  CT head/neck: No evidence of intracranial injury or facial fracture. L neck contusion without evidence of cervical spine injury 7/10  Persistent fever. Leukopenic. Abx expanded. Transitioned off insulin gtt to Lantus and SSI  INDWELLING DEVICES:: ETT 7/09 >>   MICRO DATA: Blood 7/08 >>  Resp 7/09 >>  U legionella 7/8 >> positive  RVP 7/11 >> neg PCT 7/09: 0.74, 7/11: 6.53 BCx2 7/16 >> UA 7/16 >>  UC 7/16 >>  Sputum 7/16 >>  ANTIMICROBIALS:  Ceftriaxone 7/08 >> 7/10 Azithro 7/08 >> 7/10  Azithro 7/10 >> 7/14 Vanc 7/10 >> 7/12 Ceftaz 7/10 >> 7/14 Levoquin 7/14 >>   SUBJECTIVE:  RN reports no acute events overnight.  Family concerned that patient is not waking up.    VITAL SIGNS: Temp:  [99.1 F (37.3 C)-101.2 F (38.4 C)] 99.4 F (37.4 C) (07/16 1000) Pulse Rate:  [70-136] 131 (07/16 1000) Resp:  [16-34] 27 (07/16 1000) BP: (96-193)/(49-82) 160/71 mmHg (07/16 1000) SpO2:  [94 %-100 %] 97 % (07/16 1000) FiO2 (%):  [30 %-40 %] 30 % (07/16 1114)   HEMODYNAMICS:     VENTILATOR SETTINGS: Vent Mode:  [-] PRVC FiO2 (%):  [30 %-40 %] 30 % Set Rate:  [12 bmp] 12 bmp Vt Set:  [600 mL] 600 mL PEEP:  [5 cmH20] 5 cmH20 Plateau Pressure:  [18 cmH20-24 cmH20] 18 cmH20   INTAKE / OUTPUT:  Intake/Output Summary  (Last 24 hours) at 04/11/15 1259 Last data filed at 04/11/15 1100  Gross per 24 hour  Intake 1827.51 ml  Output   3285 ml  Net -1457.49 ml    PHYSICAL EXAMINATION: General:  NAD, RASS -2 Neuro:  No focal deficits HEENT: nasal contusion, cervical collar in place, OETT Cardiovascular:  RRR Lungs: even/non-labored, lungs bilaterally coarse with few rhonchi Abdomen:  Obese, soft, +BS Ext: mild b/l LE edema, warm  LABS:  CBC  Recent Labs Lab 04/09/15 0246 04/10/15 0246 04/11/15 0500  WBC 1.2* 1.2* 1.6*  HGB 9.7* 10.6* 10.5*  HCT 29.4* 32.3* 33.9*  PLT 287 322 392   Coag's  Recent Labs Lab 04/09/15 0246 04/09/15 1540 04/10/15 0246  INR 2.05* 2.04* 1.89*   BMET  Recent Labs Lab 04/10/15 0246 04/11/15 0500 04/11/15 1115  NA 145 155* 156*  K 3.7 3.5 3.2*  CL 111 117* 118*  CO2 BUN 69* 86* 82*  CREATININE 1.29* 1.58* 1.38*  GLUCOSE 380* 218* 217*   Electrolytes  Recent Labs Lab 04/09/15 0246  04/10/15 0246 04/11/15 0500 04/11/15 1115  CALCIUM 9.9  < > 10.6* 11.5* 11.7*  MG 1.9  --  1.8 1.9  --   PHOS 3.0  --  2.7 3.4  --   < > = values in this interval not displayed. Sepsis Markers  Recent Labs Lab  04/06/15 1116  PROCALCITON 6.53   ABG No results for input(s): PHART, PCO2ART, PO2ART in the last 168 hours.   Liver Enzymes  Recent Labs Lab 04/06/15 0646 04/07/15 0212 04/08/15 0220  AST 24 31 23   ALT 23 25 25   ALKPHOS 118 148* 141*  BILITOT 0.3 0.5 0.3  ALBUMIN 2.0* 1.9* 1.7*   Cardiac Enzymes  Recent Labs Lab 04/05/15 0335  TROPONINI 0.07*   Glucose  Recent Labs Lab 04/11/15 0057 04/11/15 0154 04/11/15 0256 04/11/15 0358 04/11/15 0512 04/11/15 1039  GLUCAP 295* 243* 221* 197* 179* 182*    7/11 CXR: NGT, ETT in good position, persistent low lung volumes, small left pleural effusion  ASSESSMENT / PLAN:  PULMONARY A: Acute respiratory failure - in setting of legionella pneumonia OSA - CPAP prior to  admission P:   MV support, 8 cc/kg Daily SBT/WUA as tolerated  Intermittent CXR  levalbuterol q6 hr  CARDIOVASCULAR A:  Hypertension S/p bradycardic arrest - suspect primary resp event Cardiac markers negative P:  Monitor in ICU MAP goal > 65 mmHg Continue home metoprolol, lipitor, effient per tube Norvasc per tube for HTN Hydral PRN for SBP >165  RENAL A:   AKI - likely ATN, improving Hypo/Hypernatremia  Hypokalemia P:   Monitor BMET / UOP Replace electrolytes as indicated  Increase free water 200 ml Q6  GASTROINTESTINAL A:   Loose stools - improved, C-Diff negative  P:   SUP: enteral pantoprazole TF per nutrition   HEMATOLOGIC A:   Factor V Leiden with h/o PE, 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 Neutropenia, recurrent, has documented history of this dating to 2013 INR improving P:  Heparin gtt per pharmacy, hold warfarin while intubated  Monitor Coags  Monitor CBC intermittently Transfuse per ICU guidelines  INFECTIOUS A:   Legionella CAP  Severe sepsis 2/2 CAP Legionella Fever - new 7/15, 7/16 P:   Monitor temp, WBC count Follow PCT D6/x abx  Levoquin 7/14 >>  Re-culture 7/16 as above   ENDOCRINE A:   DM 2, poorly controlled P:   Insulin gtt CBG per protocol   NEUROLOGIC A:   Acute Encephalopathy - post respiratory arrest.  Repeat CT 7/15 negative for acute process.  ? Hypernatremia contributing factor.   Neck contusion by CT scan - no spine or cord injury noted P:   RASS goal: -1 PAD protocol - Fentanyl gtt, minimize sedation as able  Daily WUA Cont C collar until clinical clearance of C spine after extubation Serial neuro exams   FAMILY  - Update: Patients father, daughter and son-in-law updated at bedside.      Canary BrimBrandi Ollis, NP-C [place] [facility] Pgr: [phone] or if no answer [phone] 04/11/2015, 12:59 PM  Reviewed above, examined.  He still has fever.  Not able to tolerate SBT.    Unresponsive.  Tachycardic, scattered  rhonchi, abd soft, no edema.  Labs show WBC 1.6, Na 156.  Polyuria likely from hyperglycemia >> evening out now.  Continue levaquin.  Repeat cx's.  Adjust fluids.  Continue full vent support.  CC time by me independent of APP time is 35 minutes.  Coralyn HellingVineet Arrow Tomko, MD [facility] 04/11/2015, 2:31 PM Pager:  [phone] After 3pm call: [phone]

## 2015-04-11 NOTE — Progress Notes (Signed)
Attempted wean.  Pts RR 36, BP 178/80, HR 120.  Placed back in full support

## 2015-04-11 NOTE — Progress Notes (Signed)
RN enter pt room bc vent was alarming. Pt was using accessory muscle for breathing (belly breathing), desat into high 60's to 70's. RN attempted to bag pt but Ambu bag wouldn't compress. Pt was lavaged and suctioned with pink tinge secretion.  RT was called to access the pt. RN will continue to monitor. Pt resting comfortably at this time.

## 2015-04-11 NOTE — Progress Notes (Signed)
eLink Physician-Brief Progress Note Patient Name: Rayetta HumphreyJerry L Gordan DOB: 1951-06-09 MRN: [id number]   Date of Service  04/11/2015  HPI/Events of Note  Patient is on 25 units/hour IV insulin infusion. Blood glucose = 186 -198. He weighs 123 kg and has pneumonia. I suspect that he has a large amount of insulin resistance d/t infection and his body weight.   eICU Interventions  Continue to escalate Insulin IV infusion to control blood glucose.     Intervention Category Intermediate Interventions: Hyperglycemia - evaluation and treatment  Danaysha Kirn Dennard Nipugene 04/11/2015, 9:21 PM

## 2015-04-11 NOTE — Progress Notes (Signed)
ANTICOAGULATION CONSULT NOTE - FOLLOW UP  Pharmacy Consult:  Heparin Indication: Factor V Leiden + history of PE  Allergies  Allergen Reactions  . Piroxicam Other (See Comments)    Blistering on hands  . Rofecoxib Other (See Comments)    REACTION: Reaction not known    Patient Measurements: Height: 5\' 11"  (180.3 cm) Weight: 273 lb 2.4 oz (123.9 kg) IBW/kg (Calculated) : 75.3 Heparin Dosing Weight: 103 kg  Vital Signs: Temp: 99.4 F (37.4 C) (07/16 0600) Temp Source: Core (Comment) (07/15 2000) BP: 128/61 mmHg (07/16 0600) Pulse Rate: 105 (07/16 0600)  Labs:  Recent Labs  04/09/15 0246 04/09/15 1540 04/09/15 1740  04/10/15 0246 04/10/15 1230 04/10/15 2020 04/11/15 0500  HGB 9.7*  --   --   --  10.6*  --   --  10.5*  HCT 29.4*  --   --   --  32.3*  --   --  33.9*  PLT 287  --   --   --  322  --   --  392  LABPROT 23.0* 22.9*  --   --  21.6*  --   --   --   INR 2.05* 2.04*  --   --  1.89*  --   --   --   HEPARINUNFRC  --  <0.10*  --   < > <0.10* <0.10* 0.24* 0.36  CREATININE 1.42*  --  1.16  --  1.29*  --   --   --   < > = values in this interval not displayed.  Estimated Creatinine Clearance: 80.5 mL/min (by C-G formula based on Cr of 1.29).     Assessment: [age] YOM admitted 04/23/2015  with history of Factor V Leiden and PE presented with cough and fever from PCP office.   AC: Coumadin for hx factor V Leiden + hx PE, INR 1.51 on admit (5mg  daily except 7.5mg  on Wed) >> hold warfarin while in ICU (last dose 7/10) and convert to heparin. On  2600 units/hr of heparin and level therapeutic.  Goal of Therapy:  Heparin level 0.3-0.7 units/ml Monitor platelets by anticoagulation protocol: Yes  Plan:  - Continue heparin infusion to 2600 units/hr  - Will f/u 6 hr confirmatory level  Thank you for allowing pharmacy to be a part of this patients care team.  Christoper Fabianaron Markevius Trombetta, PharmD, BCPS Clinical pharmacist, pager [phone] 04/11/2015 7:18 AM

## 2015-04-12 ENCOUNTER — Inpatient Hospital Stay (HOSPITAL_COMMUNITY): Payer: BLUE CROSS/BLUE SHIELD

## 2015-04-12 DIAGNOSIS — G931 Anoxic brain damage, not elsewhere classified: Secondary | ICD-10-CM

## 2015-04-12 LAB — GLUCOSE, CAPILLARY
GLUCOSE-CAPILLARY: 119 mg/dL — AB (ref 65–99)
GLUCOSE-CAPILLARY: 146 mg/dL — AB (ref 65–99)
GLUCOSE-CAPILLARY: 156 mg/dL — AB (ref 65–99)
GLUCOSE-CAPILLARY: 159 mg/dL — AB (ref 65–99)
GLUCOSE-CAPILLARY: 161 mg/dL — AB (ref 65–99)
GLUCOSE-CAPILLARY: 162 mg/dL — AB (ref 65–99)
GLUCOSE-CAPILLARY: 167 mg/dL — AB (ref 65–99)
GLUCOSE-CAPILLARY: 174 mg/dL — AB (ref 65–99)
GLUCOSE-CAPILLARY: 184 mg/dL — AB (ref 65–99)
GLUCOSE-CAPILLARY: 186 mg/dL — AB (ref 65–99)
GLUCOSE-CAPILLARY: 187 mg/dL — AB (ref 65–99)
GLUCOSE-CAPILLARY: 189 mg/dL — AB (ref 65–99)
GLUCOSE-CAPILLARY: 191 mg/dL — AB (ref 65–99)
GLUCOSE-CAPILLARY: 204 mg/dL — AB (ref 65–99)
Glucose-Capillary: 190 mg/dL — ABNORMAL HIGH (ref 65–99)
Glucose-Capillary: 192 mg/dL — ABNORMAL HIGH (ref 65–99)
Glucose-Capillary: 147 mg/dL — ABNORMAL HIGH (ref 65–99)
Glucose-Capillary: 163 mg/dL — ABNORMAL HIGH (ref 65–99)
Glucose-Capillary: 170 mg/dL — ABNORMAL HIGH (ref 65–99)
Glucose-Capillary: 148 mg/dL — ABNORMAL HIGH (ref 65–99)
Glucose-Capillary: 158 mg/dL — ABNORMAL HIGH (ref 65–99)
Glucose-Capillary: 161 mg/dL — ABNORMAL HIGH (ref 65–99)
Glucose-Capillary: 161 mg/dL — ABNORMAL HIGH (ref 65–99)
Glucose-Capillary: 157 mg/dL — ABNORMAL HIGH (ref 65–99)
Glucose-Capillary: 168 mg/dL — ABNORMAL HIGH (ref 65–99)
Glucose-Capillary: 203 mg/dL — ABNORMAL HIGH (ref 65–99)
Glucose-Capillary: 201 mg/dL — ABNORMAL HIGH (ref 65–99)
Glucose-Capillary: 196 mg/dL — ABNORMAL HIGH (ref 65–99)
Glucose-Capillary: 169 mg/dL — ABNORMAL HIGH (ref 65–99)

## 2015-04-12 LAB — URINE CULTURE: CULTURE: NO GROWTH

## 2015-04-12 LAB — BASIC METABOLIC PANEL
Anion gap: 3 — ABNORMAL LOW (ref 5–15)
BUN: 79 mg/dL — ABNORMAL HIGH (ref 6–20)
CALCIUM: 11.1 mg/dL — AB (ref 8.9–10.3)
CHLORIDE: 117 mmol/L — AB (ref 101–111)
CO2: 32 mmol/L (ref 22–32)
Creatinine, Ser: 1.38 mg/dL — ABNORMAL HIGH (ref 0.61–1.24)
GFR calc non Af Amer: 54 mL/min — ABNORMAL LOW (ref >60)
Glucose, Bld: 158 mg/dL — ABNORMAL HIGH (ref 65–99)
Potassium: 4.8 mmol/L (ref 3.5–5.1)
SODIUM: 152 mmol/L — AB (ref 135–145)

## 2015-04-12 LAB — CBC
HEMATOCRIT: 32.5 % — AB (ref 39.0–52.0)
HEMOGLOBIN: 10.1 g/dL — AB (ref 13.0–17.0)
MCH: 27.2 pg (ref 26.0–34.0)
MCHC: 31.1 g/dL (ref 30.0–36.0)
MCV: 87.4 fL (ref 78.0–100.0)
Platelets: 364 10*3/uL (ref 150–400)
RBC: 3.72 MIL/uL — AB (ref 4.22–5.81)
RDW: 16.3 % — AB (ref 11.5–15.5)
WBC: 1.2 10*3/uL — CL (ref 4.0–10.5)

## 2015-04-12 LAB — MAGNESIUM
Magnesium: 1.9 mg/dL (ref 1.7–2.4)
Magnesium: 1.8 mg/dL (ref 1.7–2.4)

## 2015-04-12 LAB — PHOSPHORUS: PHOSPHORUS: 2.6 mg/dL (ref 2.5–4.6)

## 2015-04-12 LAB — HEPARIN LEVEL (UNFRACTIONATED): Heparin Unfractionated: 0.36 IU/mL (ref 0.30–0.70)

## 2015-04-12 MED ORDER — FREE WATER
300.0000 mL | Freq: Four times a day (QID) | Status: DC
  Administered 2015-04-12 – 2015-04-16 (×15): 300 mL

## 2015-04-12 MED ORDER — METOPROLOL TARTRATE 25 MG PO TABS
25.0000 mg | ORAL_TABLET | Freq: Two times a day (BID) | ORAL | Status: DC
  Administered 2015-04-12 – 2015-04-16 (×8): 25 mg
  Filled 2015-04-12 (×9): qty 1

## 2015-04-12 NOTE — Progress Notes (Signed)
CRITICAL VALUE ALERT  Critical value received:  WBC 1.2  Date of notification:  04/12/15  Time of notification:  500  Critical value read back:Yes.    Nurse who received alert:  J.Clytee Heinrich  MD notified (1st page):  ELink MD  Time of first page:  500  MD notified (2nd page):  Time of second page:  Responding MD:  Pola CornElink MD  Time MD responded:  500

## 2015-04-12 NOTE — Consult Note (Signed)
Neurology Consultation Reason for Consult: Stroke Referring Physician: Craige [name], V  CC: Unresponsiveness  History is obtained from:Chart  HPI: [name] is a 64 y.o. male who was admitted with CAP and found the night of admission on the floor, bradycardic and apneic. Compressions were performed and he was intubated. Since that time, he has remained encephalopathic. He had multiple medical problems which seem to be improving including pneumonia and hypernatremia, yet his mental status is not improving.     ROS:  Unable to obtain due to altered mental status.   Past Medical History  Diagnosis Date  . Hyperlipidemia   . SLEEP APNEA, OBSTRUCTIVE 01/16/2009  . ESOPHAGEAL STRICTURE 01/16/2009  . PSORIASIS     uses a cream  . PULMONARY EMBOLISM     takes Coumadin daily  . NEPHROLITHIASIS, HX OF 02/27/2009  . ANEMIA-UNSPECIFIED 04/28/2010  . CAD (coronary artery disease)     a. s/p DES to RCA x2 in 2005. b. s/p DES to CFX 2009. c. s/p DES PTCA of RCA for in stent restenosis 03/2010. d. s/p Promus DES x2 to RCA 07/16/10(recurrent pain with abnormal Myoview 06/2010; residual nonobstructive disease at cath.  . Leukopenia 11/11/2011  . DEPRESSION   . HYPERTENSION   . GERD   . PONV (postoperative nausea and vomiting)   . Peripheral edema   . Peripheral neuropathy   . Arthritis   . Joint pain   . Seasonal allergies   . History of colon polyps   . History of kidney stones   . DIABETES MELLITUS-TYPE II 09/01/2010  . Plavix resistance   . Heterozygous factor V Leiden mutation   . Obesity   . Pneumonia 2015-01-01    Family History: Unable to assess secondary to patient's altered mental status.    Social History: Tob: Unable to assess secondary to patient's altered mental status.    Exam: Current vital signs: BP 160/78 mmHg  Pulse 122  Temp(Src) 99.9 F (37.7 C) (Core (Comment))  Resp 19  Ht 5\' 11"  (1.803 m)  Wt 123.9 kg (273 lb 2.4 oz)  BMI 38.11 kg/m2  SpO2 96% Vital signs in  last 24 hours: Temp:  [98.8 F (37.1 C)-100 F (37.8 C)] 99.9 F (37.7 C) (07/17 1400) Pulse Rate:  [60-133] 122 (07/17 1400) Resp:  [14-28] 19 (07/17 1400) BP: (97-180)/(42-83) 160/78 mmHg (07/17 1400) SpO2:  [72 %-100 %] 96 % (07/17 1430) FiO2 (%):  [30 %-60 %] 40 % (07/17 1430)   Physical Exam  Constitutional: Appears well-developed and well-nourished.  Psych: unresponsive Eyes: No scleral injection HENT: No OP obstrucion Head: Normocephalic.  Cardiovascular: Normal rate and regular rhythm.  Respiratory: Effort normal and breath sounds normal to anterior ascultation GI: Soft.  No distension. There is no tenderness.  Skin: WDI  Neuro: Mental Status: Patient is comatose, no response to sternal rub or voice. Does nto follow commands.  Cranial Nerves: II: Does not blink to threat.  Pupils are equal, round, and reactive to light.   III,IV, VI: He is in a C-collar, so doll's eye not performed. I do see his eyes move to the right and back to midline but not left.  VVII: corneals intact.  VIII, X, XI, XII: Unable to assess secondary to patient's altered mental status.  Motor: Tone is flaccid, minimal flexion in all 4 ext to noxious stimuli.  Sensory: As above Plantars: Toes are equivocal on the right, up on the left.  Cerebellar: Unable to assess secondary to patient's altered mental  status.     I have reviewed labs in epic and the results pertinent to this consultation are: leukopenia  I have reviewed the images obtained: CT head - 07/15 - negative  Impression: 64 yo M with persistent coma after cardiac arrest. Neurology has been asked for prognosis. My suspicion is that he has had a fairly severe anoxic brain injury, but would like to exclude other possible confounders.   Recommendations: 1) MRI brain 2) EEG 3) will continue to follow.    Ritta Slot, MD [facility] [phone]  If 7pm- 7am, please page neurology on call as listed in  [facility].

## 2015-04-12 NOTE — Progress Notes (Signed)
RT Note: Pt transported to MRI & back to unit with no complications. RT will continue to monitor. 

## 2015-04-12 NOTE — Progress Notes (Signed)
ANTICOAGULATION CONSULT NOTE - FOLLOW UP  Pharmacy Consult:  Heparin Indication: Factor V Leiden + history of PE  Allergies  Allergen Reactions  . Piroxicam Other (See Comments)    Blistering on hands  . Rofecoxib Other (See Comments)    REACTION: Reaction not known    Patient Measurements: Height: 5\' 11"  (180.3 cm) Weight: 273 lb 2.4 oz (123.9 kg) IBW/kg (Calculated) : 75.3 Heparin Dosing Weight: 103 kg  Vital Signs: Temp: 99.4 F (37.4 C) (07/17 0900) Temp Source: Core (Comment) (07/17 0400) BP: 149/59 mmHg (07/17 0900) Pulse Rate: 127 (07/17 0900)  Labs:  Recent Labs  04/09/15 1540  04/10/15 0246  04/10/15 2020 04/11/15 0500 04/11/15 1115 04/11/15 2315 04/12/15 0340  HGB  --   < > 10.6*  --   --  10.5*  --   --  10.1*  HCT  --   --  32.3*  --   --  33.9*  --   --  32.5*  PLT  --   --  322  --   --  392  --   --  364  LABPROT 22.9*  --  21.6*  --   --   --   --   --   --   INR 2.04*  --  1.89*  --   --   --   --   --   --   HEPARINUNFRC <0.10*  < > <0.10*  < > 0.24* 0.36  --   --  0.36  CREATININE  --   < > 1.29*  --   --  1.58* 1.38* 1.38*  --   < > = values in this interval not displayed.  Estimated Creatinine Clearance: 75.3 mL/min (by C-G formula based on Cr of 1.38).     Assessment: [age] YOM admitted 04/17/2015  with history of Factor V Leiden and PE presented with cough and fever from PCP office.   AC: Coumadin for hx factor V Leiden + hx PE, INR 1.51 on admit (5mg  daily except 7.5mg  on Wed) >> hold warfarin while in ICU (last dose 7/10) and convert to heparin. Heparin at 2600 units/hr. HL 0.36 x 2  Goal of Therapy:  Heparin level 0.3-0.7 units/ml Monitor platelets by anticoagulation protocol: Yes  Plan:  - Continue heparin at 2600 units/hr - Daily HL, CBC - Monitor for s/sx of bleeding  Isaac BlissMichael Kemyra August, PharmD, BCPS Clinical Pharmacist Pager [phone] 04/12/2015 9:53 AM

## 2015-04-12 NOTE — Progress Notes (Signed)
PULMONARY / CRITICAL CARE MEDICINE   Name: [name] MRN: [id number] DOB: 1950-11-12    ADMISSION DATE:  2015/04/08 CONSULTATION DATE:  04/04/2015  REFERRING MD : [facility]  CHIEF COMPLAINT:  AMS  INITIAL PRESENTATION:  64 y/o admitted for community acquired pneumonia was found down in hospital room with bradycardia and apnea. Intubated and PCCM assumed care. PMH includes OSA, DM 2, PE, factor 5 Leiden   MAJOR EVENTS/TEST RESULTS: 7/08  Admitted to telemetry by [facility] with dx of CAP 7/09  early AM: found by RN staf on floor, face down, bradycardic, apneic. Code blue called. Brief chest compressions. No meds. Intubated. Transferred to ICU.PCCM assumed care 7/09  CT head/neck: No evidence of intracranial injury or facial fracture. L neck contusion without evidence of cervical spine injury 7/10  Persistent fever. Leukopenic. Abx expanded. Transitioned off insulin gtt to Lantus and SSI 7/16  No change in mental status, not waking on exam   INDWELLING DEVICES:: ETT 7/09 >>   MICRO DATA: Blood 7/08 >> neg  Resp 7/09 >>  U legionella 7/8 >> positive  RVP 7/11 >> neg PCT 7/09: 0.74, 7/11: 6.53 BCx2 7/16 >> UA 7/16 >> neg UC 7/16 >> neg  Sputum 7/16 >>  ANTIMICROBIALS:  Ceftriaxone 7/08 >> 7/10 Azithro 7/08 >> 7/10  Azithro 7/10 >> 7/14 Vanc 7/10 >> 7/12 Ceftaz 7/10 >> 7/14 Levaquin 7/14 >>   SUBJECTIVE:  RN reports patient failed SBT due to tachycardia, tachypnea and HTN.   Tmax 100, Na improved to 152.  Not waking on neuro exam.   VITAL SIGNS: Temp:  [98.8 F (37.1 C)-100 F (37.8 C)] 99.7 F (37.6 C) (07/17 1200) Pulse Rate:  [60-133] 125 (07/17 1227) Resp:  [16-28] 21 (07/17 1227) BP: (97-180)/(42-83) 156/71 mmHg (07/17 1227) SpO2:  [72 %-100 %] 97 % (07/17 1227) FiO2 (%):  [30 %-60 %] 40 % (07/17 1227)   VENTILATOR SETTINGS: Vent Mode:  [-] PRVC FiO2 (%):  [30 %-60 %] 40 % Set Rate:  [16 bmp] 16 bmp Vt Set:  [600 mL] 600 mL PEEP:  [5 cmH20] 5 cmH20 Plateau  Pressure:  [21 cmH20-26 cmH20] 21 cmH20   INTAKE / OUTPUT:  Intake/Output Summary (Last 24 hours) at 04/12/15 1252 Last data filed at 04/12/15 1200  Gross per 24 hour  Intake 5977.25 ml  Output   2570 ml  Net 3407.25 ml    PHYSICAL EXAMINATION: General:  Morbidly obese male in NAD, RASS -2 Neuro:  No response to verbal stimuli, pupils 3mm equal / reactive, +gag/cough with suctioning HEENT: nasal contusion, cervical collar in place, OETT Cardiovascular:  RRR Lungs: even/non-labored, lungs bilaterally coarse  Abdomen:  Obese, soft, +BS Ext: mild b/l LE edema, warm  LABS:  CBC  Recent Labs Lab 04/10/15 0246 04/11/15 0500 04/12/15 0340  WBC 1.2* 1.6* 1.2*  HGB 10.6* 10.5* 10.1*  HCT 32.3* 33.9* 32.5*  PLT 322 392 364   Coag's  Recent Labs Lab 04/09/15 0246 04/09/15 1540 04/10/15 0246  INR 2.05* 2.04* 1.89*   BMET  Recent Labs Lab 04/11/15 0500 04/11/15 1115 04/11/15 2315  NA 155* 156* 152*  K 3.5 3.2* 4.8  CL 117* 118* 117*  CO2 28 28 32  BUN 86* 82* 79*  CREATININE 1.58* 1.38* 1.38*  GLUCOSE 218* 217* 158*   Electrolytes  Recent Labs Lab 04/10/15 0246 04/11/15 0500 04/11/15 1115 04/11/15 2315 04/12/15 0340  CALCIUM 10.6* 11.5* 11.7* 11.1*  --   MG 1.8 1.9  --  1.9 1.8  PHOS 2.7 3.4  --   --  2.6   Sepsis Markers  Recent Labs Lab 04/06/15 1116  PROCALCITON 6.53   ABG No results for input(s): PHART, PCO2ART, PO2ART in the last 168 hours.   Liver Enzymes  Recent Labs Lab 04/06/15 0646 04/07/15 0212 04/08/15 0220  AST 24 31 23   ALT 23 25 25   ALKPHOS 118 148* 141*  BILITOT 0.3 0.5 0.3  ALBUMIN 2.0* 1.9* 1.7*   Cardiac Enzymes No results for input(s): TROPONINI, PROBNP in the last 168 hours.   Glucose  Recent Labs Lab 04/12/15 0135 04/12/15 0231 04/12/15 0328 04/12/15 0432 04/12/15 0535 04/12/15 0640  GLUCAP 174* 163* 189* 170* 159* 148*    7/17 CXR: NGT, ETT in good position, persistent low lung volumes, small left  pleural effusion, LLL opacity   ASSESSMENT / PLAN:  PULMONARY A: Acute respiratory failure - in setting of legionella pneumonia Legionella PNA OSA - CPAP prior to admission P:   MV support, 8 cc/kg Daily SBT/WUA as tolerated  Intermittent CXR  levalbuterol q6 hr  CARDIOVASCULAR A:  Hypertension S/p bradycardic arrest - suspect primary resp event Cardiac markers negative P:  Monitor in ICU MAP goal > 65 mmHg Continue home lipitor, effient per tube Increase lopressor to 25 BID 7/17 Norvasc Hydral PRN for SBP >165  RENAL A:   AKI - likely ATN, improving Hypo/Hypernatremia  Hypokalemia P:   Monitor BMET / UOP Replace electrolytes as indicated  Increase free water 300 ml Q6 Hopeful to reduce D5 with hyperglycemia   GASTROINTESTINAL A:   Loose stools - improved, C-Diff negative  P:   SUP: enteral pantoprazole TF per nutrition   HEMATOLOGIC A:   Factor V Leiden with h/o PE, 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 Neutropenia, recurrent, has documented history of this dating to 2013 INR improving P:  Heparin gtt per pharmacy, hold warfarin while intubated  Monitor Coags  Monitor CBC intermittently Transfuse per ICU guidelines  INFECTIOUS A:   Legionella CAP  Severe sepsis 2/2 CAP Legionella Fever - new 7/15, 7/16 P:   Monitor temp, WBC count Follow PCT D7/x abx  Levaquin 7/14 >>  Re-culture 7/16 as above   ENDOCRINE A:   DM 2, poorly controlled P:   Insulin gtt CBG per protocol  Increased free water as above, hopeful to d/c D5 infusion   NEUROLOGIC A:   Acute Encephalopathy - post respiratory arrest.  Repeat CT 7/15 negative for acute process.  ? Hypernatremia contributing factor vs anoxic encephalopathy.   Neck contusion by CT scan - no spine or cord injury noted P:   RASS goal: 0 PAD protocol - Fentanyl gtt, minimize sedation as able  Daily WUA Neurology consult, appreciate input  Assess EEG, MRI  Cont C collar until clinical clearance of C spine after  extubation Serial neuro exams    FAMILY  - Update: Patients wife updated at bedside.     Adam BrimBrandi Ollis, NP-C [facility] Pgr: [phone] or if no answer [phone] 04/12/2015, 12:52 PM  He remains unresponsive.  Na getting better.  Still needing vent support.  Has b/l crackles.  Updated his wife at bedside.  Will ask neurology to assess >> concerned that he might have suffered anoxic injury during cardiac arrest.  Adam HellingVineet Jazmine Heckman, MD [facility] 04/12/2015, 2:29 PM Pager:  [phone] After 3pm call: [phone]

## 2015-04-12 NOTE — Progress Notes (Signed)
Utilization review completed.  

## 2015-04-13 ENCOUNTER — Inpatient Hospital Stay (HOSPITAL_COMMUNITY): Payer: BLUE CROSS/BLUE SHIELD

## 2015-04-13 DIAGNOSIS — J9600 Acute respiratory failure, unspecified whether with hypoxia or hypercapnia: Secondary | ICD-10-CM

## 2015-04-13 DIAGNOSIS — J9601 Acute respiratory failure with hypoxia: Secondary | ICD-10-CM

## 2015-04-13 DIAGNOSIS — R4182 Altered mental status, unspecified: Secondary | ICD-10-CM

## 2015-04-13 DIAGNOSIS — G9340 Encephalopathy, unspecified: Secondary | ICD-10-CM | POA: Insufficient documentation

## 2015-04-13 LAB — COMPREHENSIVE METABOLIC PANEL
ALT: 40 U/L (ref 17–63)
AST: 48 U/L — AB (ref 15–41)
Albumin: 1.9 g/dL — ABNORMAL LOW (ref 3.5–5.0)
Alkaline Phosphatase: 113 U/L (ref 38–126)
Anion gap: 6 (ref 5–15)
BUN: 70 mg/dL — AB (ref 6–20)
CHLORIDE: 109 mmol/L (ref 101–111)
CO2: 28 mmol/L (ref 22–32)
Calcium: 10.3 mg/dL (ref 8.9–10.3)
Creatinine, Ser: 1.27 mg/dL — ABNORMAL HIGH (ref 0.61–1.24)
GFR, EST NON AFRICAN AMERICAN: 59 mL/min — AB (ref >60)
Glucose, Bld: 404 mg/dL — ABNORMAL HIGH (ref 65–99)
Potassium: 3.2 mmol/L — ABNORMAL LOW (ref 3.5–5.1)
SODIUM: 143 mmol/L (ref 135–145)
TOTAL PROTEIN: 6.1 g/dL — AB (ref 6.5–8.1)
Total Bilirubin: 0.3 mg/dL (ref 0.3–1.2)

## 2015-04-13 LAB — CULTURE, RESPIRATORY

## 2015-04-13 LAB — CBC
HCT: 30.9 % — ABNORMAL LOW (ref 39.0–52.0)
HEMOGLOBIN: 9.7 g/dL — AB (ref 13.0–17.0)
MCH: 27.2 pg (ref 26.0–34.0)
MCHC: 31.4 g/dL (ref 30.0–36.0)
MCV: 86.8 fL (ref 78.0–100.0)
Platelets: 340 10*3/uL (ref 150–400)
RBC: 3.56 MIL/uL — AB (ref 4.22–5.81)
RDW: 16.1 % — AB (ref 11.5–15.5)
WBC: 0.9 10*3/uL — AB (ref 4.0–10.5)

## 2015-04-13 LAB — GLUCOSE, CAPILLARY
GLUCOSE-CAPILLARY: 130 mg/dL — AB (ref 65–99)
GLUCOSE-CAPILLARY: 168 mg/dL — AB (ref 65–99)
Glucose-Capillary: 139 mg/dL — ABNORMAL HIGH (ref 65–99)
Glucose-Capillary: 153 mg/dL — ABNORMAL HIGH (ref 65–99)
Glucose-Capillary: 168 mg/dL — ABNORMAL HIGH (ref 65–99)
Glucose-Capillary: 168 mg/dL — ABNORMAL HIGH (ref 65–99)
Glucose-Capillary: 200 mg/dL — ABNORMAL HIGH (ref 65–99)

## 2015-04-13 LAB — CULTURE, RESPIRATORY W GRAM STAIN

## 2015-04-13 LAB — HEPARIN LEVEL (UNFRACTIONATED): Heparin Unfractionated: 0.37 IU/mL (ref 0.30–0.70)

## 2015-04-13 MED ORDER — POTASSIUM CHLORIDE 20 MEQ/15ML (10%) PO SOLN
ORAL | Status: AC
  Filled 2015-04-13: qty 30

## 2015-04-13 MED ORDER — POTASSIUM CHLORIDE 20 MEQ/15ML (10%) PO SOLN
40.0000 meq | Freq: Every day | ORAL | Status: DC
  Administered 2015-04-13 – 2015-04-16 (×4): 40 meq
  Filled 2015-04-13 (×4): qty 30

## 2015-04-13 MED ORDER — POTASSIUM CHLORIDE 10 MEQ/100ML IV SOLN
10.0000 meq | INTRAVENOUS | Status: DC
Start: 2015-04-13 — End: 2015-04-13

## 2015-04-13 NOTE — Procedures (Signed)
ELECTROENCEPHALOGRAM REPORT  Patient: Adam HumphreyJerry L Mascio       Room #: [id number] EEG No. ID: [id number] Age: 64 y.o.        Sex: male Referring Physician: Sung AmabileSIMONDS, D Report Date:  04/13/2015        Interpreting Physician: Aline BrochureSTEWART,Tabia Landowski R  History: Adam HumphreyJerry L Ricke is an 64 y.o. male with diabetes mellitus, hypertension and hyperlipidemia who was admitted on 03/27/2015 for community-acquired pneumonia and bradycardia with heart failure requiring intubation. Patient has remained unresponsive except for withdrawal movements, despite improvement in infectious process and hypernatremia.  Indications for study:  Assess severity of encephalopathy; rule out seizure activity.  Technique: This is an 18 channel routine scalp EEG performed at the bedside with bipolar and monopolar montages arranged in accordance to the international 10/20 system of electrode placement.   Description: Patient was intubated and on mechanical ventilation the time of this study. He had not received recent sedating medication. Predominant activity consisted of very low amplitude diffuse mixed irregular delta and theta activity continuously. Occasional superimposed sleep spindles are recorded from the central and frontal head regions. Photic stimulation was not performed. No epileptiform discharges were recorded.  Interpretation: This EEG is abnormal with severe diffuse continuous nonspecific slowing of cerebral activity. This pattern of slowing can be seen with metabolic and toxic encephalopathies, as well as severe degenerative encephalopathic states. No evidence of an epileptic disorder was demonstrated. A lack of seizure discharges during an EEG recording does not, in and of itself, rule out an epileptic disorder, however.   Venetia MaxonR Wallie Lagrand M.D. [facility] [phone]

## 2015-04-13 NOTE — Progress Notes (Signed)
EEG completed; results pending.    

## 2015-04-13 NOTE — Progress Notes (Signed)
Subjective: MRI and EEG performed, unable to obtain subjective due to coma  Exam: Filed Vitals:   04/13/15 1100  BP: 146/62  Pulse: 103  Temp: 99.3 F (37.4 C)  Resp: 23   Gen: In bed, NAD Resp: ventilated Abd: soft, nt  Neuro: MS: doe snot open eyes or follow commands.  WJ:XBJYNCN:PERRL, corneals intact, eyes cross midline in both directions.  Motor: withdraws all four extremities to noxious stimulus.  Sensory:as above  Pertinent Labs: Na 143  MRI brain - bilateral striatum insults. 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 I feel that his exam right now is not conclusive that a poor outcome is definite. He has had some injury, but I think that he does have potential for recovery(liekly to take wees to months) and would favor continued supporitve care, likely will need trach/peg if no rapid improvement. I do not think that recovery is definite.   I have discussed this with his wife, I do not think that re-evaluation in a few days is likely to change this recommendation for supportive care.   Recommendations: 1) I would favor continued supportive care at this time.  2) Please call if there are any further questions or concerns.   Ritta SlotMcNeill Kirkpatrick, MD [facility] [phone]  If 7pm- 7am, please page neurology on call as listed in AMION.

## 2015-04-13 NOTE — Progress Notes (Signed)
ANTICOAGULATION CONSULT NOTE - FOLLOW UP  Pharmacy Consult:  Heparin Indication: Factor V Leiden + history of PE  Allergies  Allergen Reactions  . Piroxicam Other (See Comments)    Blistering on hands  . Rofecoxib Other (See Comments)    REACTION: Reaction not known    Patient Measurements: Height: 5\' 11"  (180.3 cm) Weight: 273 lb 2.4 oz (123.9 kg) IBW/kg (Calculated) : 75.3 Heparin Dosing Weight: 103 kg  Vital Signs: Temp: 99.3 F (37.4 C) (07/18 1100) BP: 145/55 mmHg (07/18 1238) Pulse Rate: 117 (07/18 1238)  Labs:  Recent Labs  04/11/15 0500 04/11/15 1115 04/11/15 2315 04/12/15 0340 04/13/15 0410  HGB 10.5*  --   --  10.1* 9.7*  HCT 33.9*  --   --  32.5* 30.9*  PLT 392  --   --  364 340  HEPARINUNFRC 0.36  --   --  0.36 0.37  CREATININE 1.58* 1.38* 1.38*  --  1.27*    Estimated Creatinine Clearance: 81.8 mL/min (by C-G formula based on Cr of 1.27).   Assessment: [name] admitted 04/19/2015  with history of Factor V Leiden and PE presented with cough and fever from PCP office.   AC: Coumadin for hx factor V Leiden + hx PE, INR 1.51 on admit (5mg  daily except 7.5mg  on Wed) >> hold warfarin while in ICU (last dose 7/10) and convert to heparin. Heparin at 2600 units/hr.  HL therapeutic at 0.37.  CBC stable,  No bleeding reported.   Goal of Therapy:  Heparin level 0.3-0.7 units/ml Monitor platelets by anticoagulation protocol: Yes  Plan:  - Continue heparin at 2600 units/hr - Daily HL, CBC - Monitor for s/sx of bleeding  Herby AbrahamMichelle T. Madyx Delfin, Pharm.D. [phone] 04/13/2015 2:01 PM

## 2015-04-13 NOTE — Progress Notes (Signed)
RT attempted wean at 08:14 CPAP 5 PS 5. At 09:13 RN notified RT that patient's RR > 40, increased blood pressure and heart rate. Patient placed back on full support.

## 2015-04-13 NOTE — Progress Notes (Signed)
PULMONARY / CRITICAL CARE MEDICINE   Name: [name] MRN: [id number] DOB: [birth year]    ADMISSION DATE:  03/27/2015 CONSULTATION DATE:  04/04/2015  REFERRING MD : [facility]  CHIEF COMPLAINT:  AMS  INITIAL PRESENTATION:  64 y/o admitted for community acquired pneumonia was found down in hospital room with bradycardia and apnea. Intubated and PCCM assumed care. PMH includes OSA, DM 2, PE, factor 5 Leiden   MAJOR EVENTS/TEST RESULTS: 7/08  [facility] 7/09  early AM: found by RN staf on floor, face down, bradycardic, apneic. Code blue called. Brief chest compressions. No meds. Intubated. Transferred to ICU.PCCM assumed care 7/09  CT head/neck: No evidence of intracranial injury or facial fracture. L neck contusion without evidence of cervical spine injury 7/10  Persistent fever. Leukopenic. Abx expanded. Transitioned off insulin gtt to Lantus and SSI 7/16  No change in mental status, not waking on exam  7/17 Neuro consult- MR Brain 1. Bilateral striatum swelling and probable infarcts, consistent with anoxic injury given the clinical circumstances. 2. Bilateral mastoid effusion.  INDWELLING DEVICES:: ETT 7/09 >>   MICRO DATA: Blood 7/08 >> neg  Resp 7/09 >>  U legionella 7/8 >> positive  RVP 7/11 >> neg PCT 7/09: 0.74, 7/11: 6.53 BCx2 7/16 >> UA 7/16 >> neg UC 7/16 >> neg  Sputum 7/16 >>  ANTIMICROBIALS:  Ceftriaxone 7/08 >> 7/10 Azithro 7/08 >> 7/10  Azithro 7/10 >> 7/14 Vanc 7/10 >> 7/12 Ceftaz 7/10 >> 7/14 Levaquin 7/14 >>   SUBJECTIVE:   Febrile to max 100.5, else NAD  VITAL SIGNS: Temp:  [99 F (37.2 C)-100.5 F (38.1 C)] 99.5 F (37.5 C) (07/18 0600) Pulse Rate:  [56-135] 81 (07/18 0600) Resp:  [14-28] 18 (07/18 0600) BP: (96-175)/(42-82) 99/43 mmHg (07/18 0600) SpO2:  [90 %-98 %] 96 % (07/18 0600) FiO2 (%):  [40 %] 40 % (07/18 0700)   VENTILATOR SETTINGS: Vent Mode:  [-] PRVC FiO2 (%):  [40 %] 40 % Set Rate:  [16 bmp] 16  bmp Vt Set:  [600 mL] 600 mL PEEP:  [5 cmH20] 5 cmH20 Plateau Pressure:  [20 cmH20-26 cmH20] 20 cmH20   INTAKE / OUTPUT:  Intake/Output Summary (Last 24 hours) at 04/13/15 0805 Last data filed at 04/13/15 0600  Gross per 24 hour  Intake 4841.35 ml  Output   2860 ml  Net 1981.35 ml    PHYSICAL EXAMINATION: General:  NAD Neuro:  No response to verbal stimuli,  HEENT: nasal contusion, cervical collar in place, OETT Cardiovascular:  RRR Lungs: coarse breath sounds, equal air movement bilaterally  Abdomen:  Obese, soft, +BS Ext: mild b/l LE edema, warm  LABS:  CBC  Recent Labs Lab 04/11/15 0500 04/12/15 0340 04/13/15 0410  WBC 1.6* 1.2* 0.9*  HGB 10.5* 10.1* 9.7*  HCT 33.9* 32.5* 30.9*  PLT 392 364 340   Coag's  Recent Labs Lab 04/09/15 0246 04/09/15 1540 04/10/15 0246  INR 2.05* 2.04* 1.89*   BMET  Recent Labs Lab 04/11/15 1115 04/11/15 2315 04/13/15 0410  NA 156* 152* 143  K 3.2* 4.8 3.2*  CL 118* 117* 109  CO2 28 32 28  BUN 82* 79* 70*  CREATININE 1.38* 1.38* 1.27*  GLUCOSE 217* 158* 404*   Electrolytes  Recent Labs Lab 04/10/15 0246 04/11/15 0500 04/11/15 1115 04/11/15 2315 04/12/15 0340 04/13/15 0410  CALCIUM 10.6* 11.5* 11.7* 11.1*  --  10.3  MG 1.8 1.9  --  1.9 1.8  --  PHOS 2.7 3.4  --   --  2.6  --    Sepsis Markers  Recent Labs Lab 04/06/15 1116  PROCALCITON 6.53   ABG No results for input(s): PHART, PCO2ART, PO2ART in the last 168 hours.   Liver Enzymes  Recent Labs Lab 04/07/15 0212 04/08/15 0220 04/13/15 0410  AST 31 23 48*  ALT 25 25 40  ALKPHOS 148* 141* 113  BILITOT 0.5 0.3 0.3  ALBUMIN 1.9* 1.7* 1.9*   Cardiac Enzymes No results for input(s): TROPONINI, PROBNP in the last 168 hours.   Glucose  Recent Labs Lab 04/13/15 0113 04/13/15 0209 04/13/15 0314 04/13/15 0412 04/13/15 0519 04/13/15 0620  GLUCAP 139* 130* 168* 168* 168* 200*    7/17 CXR: NGT, ETT in good position, persistent low lung  volumes, small left pleural effusion, LLL opacity   ASSESSMENT / PLAN:  PULMONARY A: Acute respiratory failure - in setting of legionella pneumonia Legionella PNA OSA - CPAP prior to admission P:   MV support, 8 cc/kg Daily SBT/WUA as tolerated  Intermittent CXR  levalbuterol q6 hr  CARDIOVASCULAR A:  Hypertension S/p bradycardic arrest - suspect primary resp event Cardiac markers negative P:  Monitor in ICU MAP goal > 65 mmHg Continue home lipitor, effient per tube Increase lopressor to 25 BID 7/17 Norvasc Hydral PRN for SBP >165  RENAL A:   AKI - likely ATN, improving Hypo/Hypernatremia  Hypokalemia P:   Monitor BMET / UOP Replace electrolytes as indicated  Increase free water 300 ml Q6 Hopeful to reduce D5 with hyperglycemia   GASTROINTESTINAL A:   Loose stools - improved, C-Diff negative  P:   SUP: enteral pantoprazole TF per nutrition   HEMATOLOGIC A:   Factor V Leiden with h/o PE, 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 Neutropenia, recurrent, has documented history of this dating to 2013 INR improving P:  Heparin gtt per pharmacy, hold warfarin while intubated  Monitor Coags  Monitor CBC intermittently Transfuse per ICU guidelines  INFECTIOUS A:   Legionella CAP  Severe sepsis 2/2 CAP Legionella Fever - new 7/15, 7/16 P:   Monitor temp, WBC count Follow PCT Levaquin 7/14 >>  Re-culture 7/16 as above   ENDOCRINE A:   DM 2, poorly controlled P:   Insulin gtt CBG per protocol  Increased free water as above, hopeful to d/c D5 infusion   NEUROLOGIC A:   Acute Encephalopathy - post respiratory arrest.  Anoxic brain injury Repeat CT 7/15 negative for acute process.  ? Hypernatremia contributing factor vs anoxic encephalopathy.   Neck contusion by CT scan - no spine or cord injury noted P:   RASS goal: 0 PAD protocol - Fentanyl gtt, minimize sedation as able  Daily WUA Neurology consult, appreciate input  Assess EEG, MRI  Cont C collar until  clinical clearance of C spine after extubation Serial neuro exams    FAMILY  - Update: Patients wife updated at bedside.    Alyssa A. Kennon Rounds MD, MS Family Medicine Resident PGY-2 Pager [phone]  Reviewed above, examined.  MRI results d/w pt's family.  He has no change in mental status.  Neurology feels that recovery is still possible.    He did not tolerate pressure support.  He has scattered rhonchi.  Moving extremities.  Will need to d/w family about trach/peg and long term support.  Coralyn Helling, MD [facility] Pulmonary/Critical Care 04/13/2015, 3:00 PM Pager:  [phone] After 3pm call: [phone]

## 2015-04-14 ENCOUNTER — Inpatient Hospital Stay (HOSPITAL_COMMUNITY): Payer: BLUE CROSS/BLUE SHIELD

## 2015-04-14 DIAGNOSIS — G9340 Encephalopathy, unspecified: Secondary | ICD-10-CM

## 2015-04-14 LAB — BASIC METABOLIC PANEL
ANION GAP: 6 (ref 5–15)
BUN: 70 mg/dL — ABNORMAL HIGH (ref 6–20)
CO2: 29 mmol/L (ref 22–32)
Calcium: 10.3 mg/dL (ref 8.9–10.3)
Chloride: 111 mmol/L (ref 101–111)
Creatinine, Ser: 1.19 mg/dL (ref 0.61–1.24)
GFR calc Af Amer: >60 mL/min (ref >60)
Glucose, Bld: 181 mg/dL — ABNORMAL HIGH (ref 65–99)
POTASSIUM: 3.4 mmol/L — AB (ref 3.5–5.1)
Sodium: 146 mmol/L — ABNORMAL HIGH (ref 135–145)

## 2015-04-14 LAB — CBC
HEMATOCRIT: 30.3 % — AB (ref 39.0–52.0)
Hemoglobin: 9.3 g/dL — ABNORMAL LOW (ref 13.0–17.0)
MCH: 26.6 pg (ref 26.0–34.0)
MCHC: 30.7 g/dL (ref 30.0–36.0)
MCV: 86.6 fL (ref 78.0–100.0)
Platelets: 308 10*3/uL (ref 150–400)
RBC: 3.5 MIL/uL — ABNORMAL LOW (ref 4.22–5.81)
RDW: 15.8 % — ABNORMAL HIGH (ref 11.5–15.5)
WBC: 0.5 10*3/uL — CL (ref 4.0–10.5)

## 2015-04-14 LAB — GLUCOSE, CAPILLARY
GLUCOSE-CAPILLARY: 153 mg/dL — AB (ref 65–99)
GLUCOSE-CAPILLARY: 165 mg/dL — AB (ref 65–99)
GLUCOSE-CAPILLARY: 140 mg/dL — AB (ref 65–99)
GLUCOSE-CAPILLARY: 176 mg/dL — AB (ref 65–99)
GLUCOSE-CAPILLARY: 139 mg/dL — AB (ref 65–99)
Glucose-Capillary: 137 mg/dL — ABNORMAL HIGH (ref 65–99)
Glucose-Capillary: 142 mg/dL — ABNORMAL HIGH (ref 65–99)
Glucose-Capillary: 145 mg/dL — ABNORMAL HIGH (ref 65–99)
Glucose-Capillary: 147 mg/dL — ABNORMAL HIGH (ref 65–99)
Glucose-Capillary: 152 mg/dL — ABNORMAL HIGH (ref 65–99)
Glucose-Capillary: 155 mg/dL — ABNORMAL HIGH (ref 65–99)
Glucose-Capillary: 159 mg/dL — ABNORMAL HIGH (ref 65–99)
Glucose-Capillary: 170 mg/dL — ABNORMAL HIGH (ref 65–99)
Glucose-Capillary: 173 mg/dL — ABNORMAL HIGH (ref 65–99)
Glucose-Capillary: 184 mg/dL — ABNORMAL HIGH (ref 65–99)
Glucose-Capillary: 192 mg/dL — ABNORMAL HIGH (ref 65–99)
Glucose-Capillary: 197 mg/dL — ABNORMAL HIGH (ref 65–99)
Glucose-Capillary: 198 mg/dL — ABNORMAL HIGH (ref 65–99)
Glucose-Capillary: 217 mg/dL — ABNORMAL HIGH (ref 65–99)

## 2015-04-14 LAB — MAGNESIUM: Magnesium: 1.6 mg/dL — ABNORMAL LOW (ref 1.7–2.4)

## 2015-04-14 LAB — OSMOLALITY, URINE: Osmolality, Ur: 605 mOsm/kg (ref 390–1090)

## 2015-04-14 LAB — PHOSPHORUS: Phosphorus: 3.5 mg/dL (ref 2.5–4.6)

## 2015-04-14 LAB — HEPARIN LEVEL (UNFRACTIONATED): Heparin Unfractionated: 0.31 IU/mL (ref 0.30–0.70)

## 2015-04-14 LAB — PROCALCITONIN: Procalcitonin: 0.41 ng/mL

## 2015-04-14 MED ORDER — POTASSIUM CHLORIDE 10 MEQ/100ML IV SOLN: 10.0000 meq | INTRAVENOUS | Status: DC

## 2015-04-14 MED ORDER — SODIUM CHLORIDE 0.9 % IV SOLN
50.0000 ug/h | INTRAVENOUS | Status: DC
  Administered 2015-04-14 – 2015-04-16 (×2): 50 ug/h via INTRAVENOUS
  Filled 2015-04-14: qty 50

## 2015-04-14 MED ORDER — DEXTROSE 5 % IV SOLN
INTRAVENOUS | Status: DC
  Administered 2015-04-14: 11:00:00 via INTRAVENOUS

## 2015-04-14 MED ORDER — SODIUM CHLORIDE 0.9 % IV SOLN
20.0000 ug | Freq: Once | INTRAVENOUS | Status: AC
  Administered 2015-04-14: 20 ug via INTRAVENOUS
  Filled 2015-04-14: qty 5

## 2015-04-14 MED ORDER — MAGNESIUM SULFATE IN D5W 10-5 MG/ML-% IV SOLN
1.0000 g | Freq: Once | INTRAVENOUS | Status: AC
  Administered 2015-04-14: 1 g via INTRAVENOUS
  Filled 2015-04-14: qty 100

## 2015-04-14 NOTE — Progress Notes (Signed)
Nutrition Follow-up  DOCUMENTATION CODES:   Obesity unspecified  INTERVENTION:    Continue Vital High Protein via OGT at 60 ml/h (1440 ml/day) with Prostat 30 ml BID to provide 1640 kcals, 156 gm protein, 1204 ml free water daily.  NUTRITION DIAGNOSIS:   Inadequate oral intake related to inability to eat as evidenced by NPO status.  GOAL:   Provide needs based on ASPEN/SCCM guidelines  MONITOR:   Vent status, Weight trends, Labs, I & O's  ASSESSMENT:   64 y.o. male PMHx HTN, HLD, obesity, OSA, DM, presents to the ED with a chief complaint of cough, fever and chills, as well as SOB for the past 3 days. Xray shows PNA  Labs reviewed: sodium elevated, potassium low. Patient is currently receiving Vital High Protein via OGT at 60 ml/h (1440 ml/day) with Prostat 30 ml BID to provide 1640 kcals, 156 gm protein, 1204 ml free water daily. Tolerating well.  Patient remains intubated on ventilator support. Family is deciding on overall goals of care. Palliative Care Team has been consulted.  Temp (24hrs), Avg:100.2 F (37.9 C), Min:99.3 F (37.4 C), Max:101.1 F (38.4 C)  Propofol: off  Diet Order:  Diet NPO time specified  Skin:  Reviewed, no issues  Last BM:  7/19  Height:   Ht Readings from Last 1 Encounters:  04/04/15 5\' 11"  (1.803 m)    Weight:   Wt Readings from Last 1 Encounters:  04/10/15 273 lb 2.4 oz (123.9 kg)   04/06/15 281 lb 8.4 oz (127.7 kg)        Ideal Body Weight:  78.2 kg   BMI:  Body mass index is 38.11 kg/(m^2).  Estimated Nutritional Needs:   Kcal:  [id number]  Protein:  156 gm  Fluid:  2 L  EDUCATION NEEDS:   Education needs no appropriate at this time   Joaquin CourtsKimberly Harris, RD, LDN, CNSC Pager [phone] After Hours Pager [phone]

## 2015-04-14 NOTE — Progress Notes (Signed)
PULMONARY / CRITICAL CARE MEDICINE   Name: [name] MRN: [id number] DOB: 12-Jan-1951    ADMISSION DATE:  04/29/15 CONSULTATION DATE:  04/04/2015  REFERRING MD : [facility]  CHIEF COMPLAINT:  AMS  INITIAL PRESENTATION:  64 y/o admitted for community acquired pneumonia was found down in hospital room with bradycardia and apnea. Intubated and PCCM assumed care. PMH includes OSA, DM 2, PE, factor 5 Leiden   MAJOR EVENTS/TEST RESULTS: 7/08  Admitted to telemetry by [facility] with dx of CAP 7/09  early AM: found by RN staf on floor, face down, bradycardic, apneic. Code blue called. Brief chest compressions. No meds. Intubated. Transferred to ICU.PCCM assumed care 7/09  CT head/neck: No evidence of intracranial injury or facial fracture. L neck contusion without evidence of cervical spine injury 7/10  Persistent fever. Leukopenic. Abx expanded. Transitioned off insulin gtt to Lantus and SSI 7/16  No change in mental status, not waking on exam  7/17 Neuro consult- MR Brain 1. Bilateral striatum swelling and probable infarcts, consistent with anoxic injury given the clinical circumstances. 2. Bilateral mastoid effusion. EEG diffuse encephalopathy  INDWELLING DEVICES:: ETT 7/09 >>   MICRO DATA: Blood 7/08 >> neg  Resp 7/09 >>  U legionella 7/8 >> positive  RVP 7/11 >> neg PCT 7/09: 0.74, 7/11: 6.53 BCx2 7/16 >> UA 7/16 >> neg UC 7/16 >> neg  Sputum 7/16 >>  ANTIMICROBIALS:  Ceftriaxone 7/08 >> 7/10 Azithro 7/08 >> 7/10  Azithro 7/10 >> 7/14 Vanc 7/10 >> 7/12 Ceftaz 7/10 >> 7/14 Levaquin 7/14 >>   SUBJECTIVE:    Agitation with tachycardia/hypertension when the fentabnyl drip was stopped, resolved with restarting, else NAE overnight  VITAL SIGNS: Temp:  [98.7 F (37.1 C)-101.1 F (38.4 C)] 100.3 F (37.9 C) (07/19 0600) Pulse Rate:  [77-128] 91 (07/19 0600) Resp:  [14-45] 29 (07/19 0600) BP: (87-185)/(38-76) 114/38 mmHg (07/19 0600) SpO2:  [96 %-100 %] 98 % (07/19 0741) FiO2  (%):  [40 %] 40 % (07/19 0741)   VENTILATOR SETTINGS: Vent Mode:  [-] PSV;CPAP FiO2 (%):  [40 %] 40 % Set Rate:  [16 bmp] 16 bmp Vt Set:  [600 mL] 600 mL PEEP:  [5 cmH20] 5 cmH20 Pressure Support:  [5 cmH20-18 cmH20] 18 cmH20 Plateau Pressure:  [20 cmH20-37 cmH20] 20 cmH20   INTAKE / OUTPUT:  Intake/Output Summary (Last 24 hours) at 04/14/15 0809 Last data filed at 04/14/15 4098  Gross per 24 hour  Intake 3367.95 ml  Output   4400 ml  Net -1032.05 ml    PHYSICAL EXAMINATION: General:  NAD Neuro:  No response to verbal stimuli,  HEENT: healing nasal contusion, cervical collar in place, OETT Cardiovascular:  RRR Lungs: coarse breath sounds, equal air movement bilaterally  Abdomen:  Obese, soft, +BS Ext: mild b/l LE edema, warm  LABS:  CBC  Recent Labs Lab 04/12/15 0340 04/13/15 0410 04/14/15 0339  WBC 1.2* 0.9* 0.5*  HGB 10.1* 9.7* 9.3*  HCT 32.5* 30.9* 30.3*  PLT 364 340 308   Coag's  Recent Labs Lab 04/09/15 0246 04/09/15 1540 04/10/15 0246  INR 2.05* 2.04* 1.89*   BMET  Recent Labs Lab 04/11/15 2315 04/13/15 0410 04/14/15 0339  NA 152* 143 146*  K 4.8 3.2* 3.4*  CL 117* 109 111  CO2 32 28 29  BUN 79* 70* 70*  CREATININE 1.38* 1.27* 1.19  GLUCOSE 158* 404* 181*   Electrolytes  Recent Labs Lab 04/11/15 0500  04/11/15 2315 04/12/15 0340 04/13/15 0410 04/14/15 1191  CALCIUM 11.5*  < > 11.1*  --  10.3 10.3  MG 1.9  --  1.9 1.8  --  1.6*  PHOS 3.4  --   --  2.6  --  3.5  < > = values in this interval not displayed. Sepsis Markers No results for input(s): LATICACIDVEN, PROCALCITON, O2SATVEN in the last 168 hours. ABG No results for input(s): PHART, PCO2ART, PO2ART in the last 168 hours.   Liver Enzymes  Recent Labs Lab 04/08/15 0220 04/13/15 0410  AST 23 48*  ALT 25 40  ALKPHOS 141* 113  BILITOT 0.3 0.3  ALBUMIN 1.7* 1.9*   Cardiac Enzymes No results for input(s): TROPONINI, PROBNP in the last 168 hours.    Glucose  Recent Labs Lab 04/13/15 2003 04/13/15 2100 04/13/15 2157 04/13/15 2300 04/14/15 0003 04/14/15 0102  GLUCAP 155* 147* 176* 170* 152* 139*    7/19 CXR: Persistent mild hypoinflation, pulmonary vascular congestion, and left lower lobe atelectasis or pneumonia with small left pleural effusion. The right lung base has improved. The support tubes are in reasonable position.  ASSESSMENT / PLAN:  PULMONARY A: Acute respiratory failure - in setting of legionella pneumonia Legionella PNA OSA - CPAP prior to admission P:   MV support, 8 cc/kg Daily SBT/WUA as tolerated  Intermittent CXR  levalbuterol q6 hr If family in agreement will proceed with trach  CARDIOVASCULAR A:  Hypertension S/p bradycardic arrest - suspect primary resp event Cardiac markers negative P:  Monitor in ICU MAP goal > 65 mmHg Continue home lipitor, effient per tube Increase lopressor to 25 BID 7/17 Norvasc Hydral PRN for SBP >165  RENAL A:   AKI - likely ATN, improving Hypo/Hypernatremia - resolving Hypokalemia- resolving 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 P:   Monitor BMET / UOP Replace electrolytes as indicated  free water 300 ml Q6 increased 7/17 D/c D5 7/19 Follow up Urine osm DDAVP stared 7/19  GASTROINTESTINAL A:   Loose stools -  C-Diff negative  P:   SUP: enteral pantoprazole TF per nutrition   HEMATOLOGIC A:   Factor V Leiden with h/o PE, 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 Neutropenia INR improving P:  Heparin gtt per pharmacy, hold warfarin while intubated  Monitor Coags  Monitor CBC Transfuse per ICU guidelines Consider heme consult depending of wife's care wishes  INFECTIOUS A:   Legionella CAP  Severe sepsis 2/2 CAP Legionella Fever - new 7/15, 7/16 P:   Monitor temp, WBC count Follow PCT Levaquin 7/14 >>  Re-culture 7/16 >>  UCx>> NGTD Resp Cx>> few candida else NG BCx>>NGTD  If again spikes fever will re-culture and consider changing  abx   ENDOCRINE A:   DM 2, poorly controlled P:   Insulin gtt CBG per protocol  Increased free water as above, D5 d/ced 7/19  NEUROLOGIC A:   Acute Encephalopathy - post respiratory arrest.  MRI and EEG c/w encephalopathy 2/2 Anoxic brain injury Central DI with hyponatremia Neck contusion by CT scan - no spine or cord injury noted P:   RASS goal: 0 Minimize sedation Daily WUA Cont C collar until clinical clearance of C spine after extubation Serial neuro exams  Neurology consult- rec supportive care with peg/trach DDAVP as above for central DI  FAMILY  - Update: Will need to speak with the patient's wife regarding next steps of care given poor prognosis.    Alyssa A. Kennon RoundsHaney MD, MS Family Medicine Resident PGY-2 Pager [phone]  Reviewed above, examined.  He remains  unresponsive.  Unable to tolerate SBT.  BP fluctuating.  WBC lower.  Had detailed d/w pt's wife.  Reviewed his current status.  Main issue is whether they would feel comfortable with long term vent support with trach and feeding with PEG.  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 Spratley would likely want to be supported on machines without possibility of return to his previous quality of life.  I explained that his chances for meaningful recovery are likely remote.  She is conflicted about what best approach should be, and needs to discuss further with her family.  My sense is that she is leaning toward comfort measures.  To this end, I will ask palliative care team to assist [place] with discussions on goals of care.  Will defer hematology assessment for now.  CC time by me independent of resident time is 35 minutes.  Coralyn Helling, MD [facility] Pulmonary/Critical Care 04/14/2015, 11:04 AM Pager:  [phone] After 3pm call: [phone]

## 2015-04-14 NOTE — Progress Notes (Signed)
Subjective: No changes  Exam: Filed Vitals:   04/14/15 1100  BP: 97/40  Pulse: 90  Temp: 100.6 F (38.1 C)  Resp: 16   Gen: In bed, NAD Resp: ventilated Abd: soft, nt  Neuro: MS: does not open eyes or follow commands.  ZO:XWRUECN:PERRL, corneals intact, eyes cross midline in both directions.  Motor: withdraws all four extremities to noxious stimulus.  Sensory:as above  Pertinent Labs: Na 143  MRI brain - bilateral striatum insults. 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 I feel that his exam right now is not conclusive that a poor outcome is definite. He has had some injury, but I think that he does have potential for recovery(likely to take weeks to months if it occurs) and would favor continued supporitve care, likely will need trach/peg if no rapid improvement. I do not think that recovery is definite and there is a greater chance of continued coma, PVS, or death than recovery to an interactive state, but I cannot rule out the possibility of recovery based on the current exam.   I have discussed this with his wife, I do not think that re-evaluation in a few days is likely to change this recommendation for supportive care.   Recommendations: 1) I would favor continued supportive care at this time if family thinks that the patient would wish this. If he would not wish to risk living in a permanently compromised state for a chance at recovery, then withdrawal certainly could be a reasonable course.  2) Please call if there are any further questions or concerns.   Ritta SlotMcNeill Shiniqua Groseclose, MD [facility] [phone]  If 7pm- 7am, please page neurology on call as listed in AMION.

## 2015-04-14 NOTE — Care Management Note (Signed)
Case Management Note  Patient Details  Name: [name] MRN: [id number] Date of Birth: 1950/10/18  Subjective/Objective:  Pt admitted with legionella pneumonia, resp. Failure- intubated,                 Action/Plan: PTA pt lived at home with spouse-  Currently remains on Vent support- has failed attempts to wean.  NCM to follow - per MD note- Will need to speak with the patient's wife regarding next steps of care given poor prognosis.    Expected Discharge Date:                  Expected Discharge Plan:  Skilled Nursing Facility  In-House Referral:  Clinical Social Work  Discharge planning Services  CM Consult  Post Acute Care Choice:    Choice offered to:     DME Arranged:    DME Agency:     HH Arranged:    HH Agency:     Status of Service:  In process, will continue to follow  Medicare Important Message Given:    Date Medicare IM Given:    Medicare IM give by:    Date Additional Medicare IM Given:    Additional Medicare Important Message give by:     If discussed at Long Length of Stay Meetings, dates discussed:  04/13/15  Additional Comments:  Darrold SpanWebster, Adam Strohmeyer Hall, RN 04/14/2015, 10:49 AM

## 2015-04-14 NOTE — Progress Notes (Signed)
Chaplain responded to page from RN.  Chaplain met with pt's spouse at bedside.  Spouse grieving appropriately, has some questions about moving forward with a plan of care.  Spouse looking forward to conference potentially with palliative care in near future where with pt's sons present, the team can discuss plan of care.  Chaplain provided emotional support and ministry of presence and is available for follow up as needed.    04/14/15 1300  Clinical Encounter Type  Visited With Patient and family together;Health care provider  Visit Type Initial;Spiritual support;Critical Care  Referral From Nurse  Spiritual Encounters  Spiritual Needs Emotional;Grief support  Stress Factors  Family Stress Factors Family relationships;Financial concerns;Loss   Geralyn Flash 04/14/2015 1:34 PM

## 2015-04-14 NOTE — Progress Notes (Signed)
ANTICOAGULATION CONSULT NOTE - Follow Up Consult  Pharmacy Consult for heparin Indication: Factor V Leiden + history of PE  Allergies  Allergen Reactions  . Piroxicam Other (See Comments)    Blistering on hands  . Rofecoxib Other (See Comments)    REACTION: Reaction not known    Patient Measurements: Height: 5\' 11"  (180.3 cm) Weight: 273 lb 2.4 oz (123.9 kg) IBW/kg (Calculated) : 75.3 Heparin Dosing Weight: 103 kg  Vital Signs: Temp: 100.2 F (37.9 C) (07/19 0700) Temp Source: Core (Comment) (07/19 0405) BP: 126/59 mmHg (07/19 0700) Pulse Rate: 99 (07/19 0700)  Labs:  Recent Labs  04/11/15 2315  04/12/15 0340 04/13/15 0410 04/14/15 0339  HGB  --   < > 10.1* 9.7* 9.3*  HCT  --   --  32.5* 30.9* 30.3*  PLT  --   --  364 340 308  HEPARINUNFRC  --   --  0.36 0.37 0.31  CREATININE 1.38*  --   --  1.27* 1.19  < > = values in this interval not displayed.  Estimated Creatinine Clearance: 87.3 mL/min (by C-G formula based on Cr of 1.19).  Assessment: 64yo male admitted 04/10/2015 with history of factor V Leiden and PE on coumadin PTA. He presented with legionella PNA and bradycardia. Current HL 0.31 (has been therapeutic since 7/16), H/H 9.3/30.3 stable, Plt 308. No bleeding reported.   Goal of Therapy:  Heparin level 0.3-0.7 units/ml Monitor platelets by anticoagulation protocol: Yes   Plan:  Continue heparin at 2600 units/hr Daily HL, CBC Monitor for s/sx of bleeding  Casilda Carlsaylor Clotiel Troop, PharmD. Clinical Pharmacist Resident Pager: [phone]

## 2015-04-14 NOTE — Progress Notes (Signed)
RT note- placed back to full support due to increased RR and restlessness 

## 2015-04-15 ENCOUNTER — Inpatient Hospital Stay (HOSPITAL_COMMUNITY): Payer: BLUE CROSS/BLUE SHIELD

## 2015-04-15 DIAGNOSIS — Z789 Other specified health status: Secondary | ICD-10-CM

## 2015-04-15 DIAGNOSIS — Z515 Encounter for palliative care: Secondary | ICD-10-CM

## 2015-04-15 LAB — CBC
HCT: 28.7 % — ABNORMAL LOW (ref 39.0–52.0)
Hemoglobin: 8.7 g/dL — ABNORMAL LOW (ref 13.0–17.0)
MCH: 26.5 pg (ref 26.0–34.0)
MCHC: 30.3 g/dL (ref 30.0–36.0)
MCV: 87.5 fL (ref 78.0–100.0)
Platelets: 319 10*3/uL (ref 150–400)
RBC: 3.28 MIL/uL — AB (ref 4.22–5.81)
RDW: 16 % — ABNORMAL HIGH (ref 11.5–15.5)
WBC: 0.4 10*3/uL — CL (ref 4.0–10.5)

## 2015-04-15 LAB — GLUCOSE, CAPILLARY
GLUCOSE-CAPILLARY: 126 mg/dL — AB (ref 65–99)
GLUCOSE-CAPILLARY: 131 mg/dL — AB (ref 65–99)
GLUCOSE-CAPILLARY: 134 mg/dL — AB (ref 65–99)
GLUCOSE-CAPILLARY: 135 mg/dL — AB (ref 65–99)
GLUCOSE-CAPILLARY: 144 mg/dL — AB (ref 65–99)
GLUCOSE-CAPILLARY: 147 mg/dL — AB (ref 65–99)
GLUCOSE-CAPILLARY: 159 mg/dL — AB (ref 65–99)
GLUCOSE-CAPILLARY: 164 mg/dL — AB (ref 65–99)
GLUCOSE-CAPILLARY: 165 mg/dL — AB (ref 65–99)
GLUCOSE-CAPILLARY: 168 mg/dL — AB (ref 65–99)
GLUCOSE-CAPILLARY: 170 mg/dL — AB (ref 65–99)
GLUCOSE-CAPILLARY: 176 mg/dL — AB (ref 65–99)
GLUCOSE-CAPILLARY: 180 mg/dL — AB (ref 65–99)
GLUCOSE-CAPILLARY: 181 mg/dL — AB (ref 65–99)
GLUCOSE-CAPILLARY: 187 mg/dL — AB (ref 65–99)
GLUCOSE-CAPILLARY: 189 mg/dL — AB (ref 65–99)
GLUCOSE-CAPILLARY: 200 mg/dL — AB (ref 65–99)
GLUCOSE-CAPILLARY: 278 mg/dL — AB (ref 65–99)
Glucose-Capillary: 168 mg/dL — ABNORMAL HIGH (ref 65–99)
Glucose-Capillary: 169 mg/dL — ABNORMAL HIGH (ref 65–99)
Glucose-Capillary: 195 mg/dL — ABNORMAL HIGH (ref 65–99)
Glucose-Capillary: 152 mg/dL — ABNORMAL HIGH (ref 65–99)
Glucose-Capillary: 184 mg/dL — ABNORMAL HIGH (ref 65–99)
Glucose-Capillary: 198 mg/dL — ABNORMAL HIGH (ref 65–99)
Glucose-Capillary: 183 mg/dL — ABNORMAL HIGH (ref 65–99)
Glucose-Capillary: 174 mg/dL — ABNORMAL HIGH (ref 65–99)
Glucose-Capillary: 152 mg/dL — ABNORMAL HIGH (ref 65–99)
Glucose-Capillary: 135 mg/dL — ABNORMAL HIGH (ref 65–99)
Glucose-Capillary: 143 mg/dL — ABNORMAL HIGH (ref 65–99)
Glucose-Capillary: 169 mg/dL — ABNORMAL HIGH (ref 65–99)
Glucose-Capillary: 185 mg/dL — ABNORMAL HIGH (ref 65–99)
Glucose-Capillary: 156 mg/dL — ABNORMAL HIGH (ref 65–99)
Glucose-Capillary: 128 mg/dL — ABNORMAL HIGH (ref 65–99)
Glucose-Capillary: 158 mg/dL — ABNORMAL HIGH (ref 65–99)
Glucose-Capillary: 159 mg/dL — ABNORMAL HIGH (ref 65–99)
Glucose-Capillary: 177 mg/dL — ABNORMAL HIGH (ref 65–99)
Glucose-Capillary: 181 mg/dL — ABNORMAL HIGH (ref 65–99)
Glucose-Capillary: 173 mg/dL — ABNORMAL HIGH (ref 65–99)
Glucose-Capillary: 173 mg/dL — ABNORMAL HIGH (ref 65–99)

## 2015-04-15 LAB — BASIC METABOLIC PANEL
Anion gap: 7 (ref 5–15)
BUN: 68 mg/dL — ABNORMAL HIGH (ref 6–20)
CALCIUM: 10.3 mg/dL (ref 8.9–10.3)
CO2: 27 mmol/L (ref 22–32)
CREATININE: 1.22 mg/dL (ref 0.61–1.24)
Chloride: 115 mmol/L — ABNORMAL HIGH (ref 101–111)
GFR calc Af Amer: >60 mL/min (ref >60)
GFR calc non Af Amer: >60 mL/min (ref >60)
GLUCOSE: 189 mg/dL — AB (ref 65–99)
POTASSIUM: 4 mmol/L (ref 3.5–5.1)
SODIUM: 149 mmol/L — AB (ref 135–145)

## 2015-04-15 LAB — OSMOLALITY: OSMOLALITY: 336 mosm/kg — AB (ref 275–300)

## 2015-04-15 LAB — HEPARIN LEVEL (UNFRACTIONATED)
HEPARIN UNFRACTIONATED: 0.24 [IU]/mL — AB (ref 0.30–0.70)
HEPARIN UNFRACTIONATED: 0.35 [IU]/mL (ref 0.30–0.70)

## 2015-04-15 LAB — PROCALCITONIN: Procalcitonin: 0.39 ng/mL

## 2015-04-15 MED ORDER — INSULIN ASPART 100 UNIT/ML ~~LOC~~ SOLN
0.0000 [IU] | SUBCUTANEOUS | Status: DC
  Administered 2015-04-15: 11 [IU] via SUBCUTANEOUS
  Administered 2015-04-15: 4 [IU] via SUBCUTANEOUS
  Administered 2015-04-16: 20 [IU] via SUBCUTANEOUS
  Administered 2015-04-16: 15 [IU] via SUBCUTANEOUS
  Administered 2015-04-16: 20 [IU] via SUBCUTANEOUS

## 2015-04-15 MED ORDER — SODIUM CHLORIDE 0.9 % IV SOLN
500.0000 mg | Freq: Four times a day (QID) | INTRAVENOUS | Status: DC
  Administered 2015-04-15 – 2015-04-16 (×4): 500 mg via INTRAVENOUS
  Filled 2015-04-15 (×6): qty 500

## 2015-04-15 MED ORDER — VANCOMYCIN HCL IN DEXTROSE 1-5 GM/200ML-% IV SOLN
1000.0000 mg | Freq: Two times a day (BID) | INTRAVENOUS | Status: DC
Start: 2015-04-15 — End: 2015-04-16
  Administered 2015-04-15 – 2015-04-16 (×2): 1000 mg via INTRAVENOUS
  Filled 2015-04-15 (×3): qty 200

## 2015-04-15 MED ORDER — TICAGRELOR 90 MG PO TABS
90.0000 mg | ORAL_TABLET | Freq: Two times a day (BID) | ORAL | Status: DC
  Administered 2015-04-15 – 2015-04-16 (×3): 90 mg
  Filled 2015-04-15 (×4): qty 1

## 2015-04-15 MED ORDER — SODIUM CHLORIDE 0.9 % IV SOLN
2500.0000 mg | Freq: Once | INTRAVENOUS | Status: AC
  Administered 2015-04-15: 2500 mg via INTRAVENOUS
  Filled 2015-04-15: qty 2500

## 2015-04-15 MED ORDER — SODIUM CHLORIDE 0.9 % IV SOLN
20.0000 ug | Freq: Once | INTRAVENOUS | Status: DC
  Administered 2015-04-15: 20 ug via INTRAVENOUS
  Filled 2015-04-15: qty 5

## 2015-04-15 MED ORDER — DESMOPRESSIN ACETATE 4 MCG/ML IJ SOLN
1.0000 ug | Freq: Two times a day (BID) | INTRAMUSCULAR | Status: DC
  Administered 2015-04-15 – 2015-04-16 (×3): 1 ug via INTRAVENOUS
  Filled 2015-04-15 (×4): qty 0.25

## 2015-04-15 NOTE — Progress Notes (Signed)
ANTIBIOTIC CONSULT NOTE - INITIAL  Pharmacy Consult for vancomycin and imipenem Indication: legionella PNA not responding to current abx therapy  Allergies  Allergen Reactions  . Piroxicam Other (See Comments)    Blistering on hands  . Rofecoxib Other (See Comments)    REACTION: Reaction not known    Patient Measurements: Height:  (180.3 cm) Weight: 273 lb 2.4 oz (123.9 kg) IBW/kg (Calculated) : 75.3 Adjusted Body Weight: 94.8 kg  Vital Signs: Temp: 101.4 F (38.6 C) (07/20 0800) Temp Source: Core (Comment) (07/20 0400) BP: 98/40 mmHg (07/20 0800) Pulse Rate: 98 (07/20 0800) Intake/Output from previous day: 07/19 0701 - 07/20 0700 In: 2199.2 [I.V.:959.2; NG/GT:1140; IV Piggyback:100] Out: 4100 [Urine:3250; Stool:850]    Labs:  Recent Labs  04/13/15 0410 04/14/15 0339 04/15/15 0502  WBC 0.9* 0.5* 0.4*  HGB 9.7* 9.3* 8.7*  PLT 340 308 319  CREATININE 1.27* 1.19  --    Estimated Creatinine Clearance: 87.3 mL/min (by C-G formula based on Cr of 1.19). No results for input(s): VANCOTROUGH, VANCOPEAK, VANCORANDOM, GENTTROUGH, GENTPEAK, GENTRANDOM, TOBRATROUGH, TOBRAPEAK, TOBRARND, AMIKACINPEAK, AMIKACINTROU, AMIKACIN in the last 72 hours.   Microbiology: Recent Results (from the past 720 hour(s))  Blood culture (routine x 2)     Status: None   Collection Time: 04/14/2015  1:37 PM  Result Value Ref Range Status   Specimen Description BLOOD RIGHT ANTECUBITAL  Final   Special Requests BOTTLES DRAWN AEROBIC AND ANAEROBIC 5CC  Final   Culture NO GROWTH 5 DAYS  Final   Report Status 04/08/2015 FINAL  Final  Blood culture (routine x 2)     Status: None   Collection Time: 04/26/2015  2:20 PM  Result Value Ref Range Status   Specimen Description BLOOD RIGHT HAND  Final   Special Requests BOTTLES DRAWN AEROBIC AND ANAEROBIC 5CC  Final   Culture NO GROWTH 5 DAYS  Final   Report Status 04/08/2015 FINAL  Final  Culture, respiratory (NON-Expectorated)     Status: None   Collection Time: 04/04/15  5:08 AM  Result Value Ref Range Status   Specimen Description TRACHEAL ASPIRATE  Final   Special Requests NONE  Final   Gram Stain   Final    NO WBC SEEN NO SQUAMOUS EPITHELIAL CELLS SEEN NO ORGANISMS SEEN Performed at [facility]    Culture   Final    Non-Pathogenic Oropharyngeal-type Flora Isolated. Performed at [facility]    Report Status 04/06/2015 FINAL  Final  MRSA PCR Screening     Status: None   Collection Time: 04/04/15  5:15 AM  Result Value Ref Range Status   MRSA by PCR NEGATIVE NEGATIVE Final    Comment:        The GeneXpert MRSA Assay (FDA approved for NASAL specimens only), is one component of a comprehensive MRSA colonization surveillance program. It is not intended to diagnose MRSA infection nor to guide or monitor treatment for MRSA infections.   Clostridium Difficile by PCR (not at [facility])     Status: None   Collection Time: 04/06/15  7:50 AM  Result Value Ref Range Status   C difficile by pcr NEGATIVE NEGATIVE Final  Respiratory virus panel     Status: None   Collection Time: 04/06/15 12:16 PM  Result Value Ref Range Status   Respiratory Syncytial Virus A Negative Negative Final   Respiratory Syncytial Virus B Negative Negative Final   Influenza A Negative Negative Final   Influenza B Negative Negative Final  Parainfluenza 1 Negative Negative Final   Parainfluenza 2 Negative Negative Final   Parainfluenza 3 Negative Negative Final   Metapneumovirus Negative Negative Final   Rhinovirus Negative Negative Final   Adenovirus Negative Negative Final    Comment: (NOTE) Performed At: [facility] [address] [place], [place] [id number] Mila HomerHancock William F MD WU:[id number]:[phone]   Culture, blood (routine x 2)     Status: None (Preliminary result)   Collection Time: 04/11/15  1:50 PM  Result Value Ref Range Status   Specimen Description BLOOD LEFT ANTECUBITAL  Final   Special Requests BOTTLES DRAWN  AEROBIC ONLY 5CC  Final   Culture NO GROWTH 3 DAYS  Final   Report Status PENDING  Incomplete  Culture, blood (routine x 2)     Status: None (Preliminary result)   Collection Time: 04/11/15  1:57 PM  Result Value Ref Range Status   Specimen Description BLOOD LEFT ANTECUBITAL  Final   Special Requests BOTTLES DRAWN AEROBIC ONLY 5CC  Final   Culture NO GROWTH 3 DAYS  Final   Report Status PENDING  Incomplete  Culture, respiratory (NON-Expectorated)     Status: None   Collection Time: 04/11/15  2:25 PM  Result Value Ref Range Status   Specimen Description TRACHEAL ASPIRATE  Final   Special Requests NONE  Final   Gram Stain   Final    FEW WBC PRESENT, PREDOMINANTLY PMN RARE SQUAMOUS EPITHELIAL CELLS PRESENT NO ORGANISMS SEEN [facility]formed at [facility]    Culture   Final    FEW CANDIDA ALBICANS Performed at [facility]    Report Status 04/13/2015 FINAL  Final  Culture, Urine     Status: None   Collection Time: 04/11/15  4:32 PM  Result Value Ref Range Status   Specimen Description URINE, CATHETERIZED  Final   Special Requests NONE  Final   Culture NO GROWTH 1 DAY  Final   Report Status 04/12/2015 FINAL  Final    Medical History: Past Medical History  Diagnosis Date  . Hyperlipidemia   . SLEEP APNEA, OBSTRUCTIVE 01/16/2009  . ESOPHAGEAL STRICTURE 01/16/2009  . PSORIASIS     uses a cream  . PULMONARY EMBOLISM     takes Coumadin daily  . NEPHROLITHIASIS, HX OF 02/27/2009  . ANEMIA-UNSPECIFIED 04/28/2010  . CAD (coronary artery disease)     a. s/p DES to RCA x2 in 2005. b. s/p DES to CFX 2009. c. s/p DES PTCA of RCA for in stent restenosis 03/2010. d. s/p Promus DES x2 to RCA 07/16/10(recurrent pain with abnormal Myoview 06/2010; residual nonobstructive disease at cath.  . Leukopenia 11/11/2011  . DEPRESSION   . HYPERTENSION   . GERD   . PONV (postoperative nausea and vomiting)   . Peripheral edema   . Peripheral neuropathy   . Arthritis   . Joint pain   .  Seasonal allergies   . History of colon polyps   . History of kidney stones   . DIABETES MELLITUS-TYPE II 09/01/2010  . Plavix resistance   . Heterozygous factor V Leiden mutation   . Obesity   . Pneumonia 04/02/2015    Assessment: 63yoM with legionella PNA. Tmax 101.7 (difficult to control), WBC 0.4. Patient received vancomycin 7/10-7/12 (LD 2500mg , followed by 1000mg  Q12H). Current renal function SCr 1.19 (CrCl 6087ml/min), good UOP (1.1 cc/kg/h)   Goal of Therapy:  Vancomycin trough level 15-20 mcg/ml  Plan:  - Continue levofloxacin 750mg  Q24h - Will give vanc 2500mg  x1 - Followed  by vanc 1000mg  IV Q12h - Will start imipenem 500 mg IV Q6h - Monitor vanc tr at SS, Scr, Vitals  Casilda Carls, PharmD. Clinical Pharmacist Resident Pager: [phone]

## 2015-04-15 NOTE — Progress Notes (Addendum)
ANTICOAGULATION CONSULT NOTE - Follow Up Consult  Pharmacy Consult for heparin Indication: Factor V Leiden + History of PE   Allergies  Allergen Reactions  . Piroxicam Other (See Comments)    Blistering on hands  . Rofecoxib Other (See Comments)    REACTION: Reaction not known    Patient Measurements: Height: 5\' 11"  (180.3 cm) Weight: 273 lb 2.4 oz (123.9 kg) IBW/kg (Calculated) : 75.3 Heparin Dosing Weight: 103 kg  Vital Signs: Temp: 101.5 F (38.6 C) (07/20 1300) Temp Source: Core (Comment) (07/20 0400) BP: 105/45 mmHg (07/20 1300) Pulse Rate: 101 (07/20 1300)  Labs:  Recent Labs  04/13/15 0410 04/14/15 0339 04/15/15 0502 04/15/15 1310  HGB 9.7* 9.3* 8.7*  --   HCT 30.9* 30.3* 28.7*  --   PLT 340 308 319  --   HEPARINUNFRC 0.37 0.31 0.24* 0.35  CREATININE 1.27* 1.19  --  1.22    Estimated Creatinine Clearance: 85.2 mL/min (by C-G formula based on Cr of 1.22).    Assessment: 64yo male admitted 03/31/2015 with history of factor V Leiden and PE on coumadin PTA. He presented with legionella PNA and bradycardia. Current HL 0.35 (HL this AM 0.24, rate increased to 2900 units/hr), H/H 8.7/28.7 stable, Plt 319. No bleeding reported.  Goal of Therapy:  Heparin level 0.3-0.7 units/ml Monitor platelets by anticoagulation protocol: Yes   Plan:  Continue heparin at 2900 units/hr Daily HL, CBC Monitor for s/sx of bleeding  Casilda Carlsaylor Tibor Lemmons, PharmD. Clinical Pharmacist Resident Pager: [phone]

## 2015-04-15 NOTE — Progress Notes (Signed)
PULMONARY / CRITICAL CARE MEDICINE   Name: [name] MRN: [id number] DOB: November 18, 1950    ADMISSION DATE:  04/10/2015 CONSULTATION DATE:  04/04/2015  REFERRING MD : [facility]  CHIEF COMPLAINT:  AMS  INITIAL PRESENTATION:  64 y/o admitted for community acquired pneumonia was found down in hospital room with bradycardia and apnea. Intubated and PCCM assumed care. PMH includes OSA, DM 2, PE, factor 5 Leiden   MAJOR EVENTS/TEST RESULTS: 7/08  Admitted to telemetry by [facility] with dx of CAP 7/09  early AM: found by RN staf on floor, face down, bradycardic, apneic. Code blue called. Brief chest compressions. No meds. Intubated. Transferred to ICU.PCCM assumed care 7/09  CT head/neck: No evidence of intracranial injury or facial fracture. L neck contusion without evidence of cervical spine injury 7/10  Persistent fever. Leukopenic. Abx expanded. Transitioned off insulin gtt to Lantus and SSI 7/16  No change in mental status, not waking on exam  7/17 Neuro consult- MR Brain 1. Bilateral striatum swelling and probable infarcts, consistent with anoxic injury given the clinical circumstances. 2. Bilateral mastoid effusion. EEG diffuse encephalopathy  INDWELLING DEVICES:: ETT 7/09 >>   MICRO DATA: Blood 7/08 >> neg  Resp 7/09 >>  U legionella 7/8 >> positive  RVP 7/11 >> neg PCT 7/09: 0.74, 7/11: 6.53 BCx2 7/16 >> UA 7/16 >> neg UC 7/16 >> neg  Sputum 7/16 >>few candida else NG BCx 7/20>> UC 7/20 >>  Sputum >>   ANTIMICROBIALS:  Ceftriaxone 7/08 >> 7/10 Azithro 7/08 >> 7/10  Azithro 7/10 >> 7/14 Vanc 7/10 >> 7/12 Ceftaz 7/10 >> 7/14 Levaquin 7/14 >> Imipenem 7/20 >> Vanc 7/20 >>   SUBJECTIVE:   Febrile overnight to Tmax 101.9  VITAL SIGNS: Temp:  [99.9 F (37.7 C)-101.9 F (38.8 C)] 101.7 F (38.7 C) (07/20 0713) Pulse Rate:  [87-128] 99 (07/20 0713) Resp:  [15-34] 19 (07/20 0713) BP: (81-151)/(37-60) 90/37 mmHg (07/20 0700) SpO2:  [98 %-100 %] 98 % (07/20 0714) FiO2 (%):   [40 %] 40 % (07/20 0717)   VENTILATOR SETTINGS: Vent Mode:  [-] PRVC FiO2 (%):  [40 %] 40 % Set Rate:  [16 bmp] 16 bmp Vt Set:  [600 mL] 600 mL PEEP:  [5 cmH20] 5 cmH20 Plateau Pressure:  [20 cmH20-31 cmH20] 20 cmH20   INTAKE / OUTPUT:  Intake/Output Summary (Last 24 hours) at 04/15/15 0824 Last data filed at 04/15/15 0600  Gross per 24 hour  Intake 2099.18 ml  Output   3750 ml  Net -1650.82 ml    PHYSICAL EXAMINATION: General:  NAD Neuro:  No response to verbal stimuli,  HEENT: healing nasal contusion, cervical collar in place, OETT Cardiovascular:  RRR Lungs: coarse breath sounds, equal air movement bilaterally  Abdomen:  Obese, soft, +BS Ext: mild b/l LE edema, warm  LABS:  CBC  Recent Labs Lab 04/13/15 0410 04/14/15 0339 04/15/15 0502  WBC 0.9* 0.5* 0.4*  HGB 9.7* 9.3* 8.7*  HCT 30.9* 30.3* 28.7*  PLT 340 308 319   Coag's  Recent Labs Lab 04/09/15 0246 04/09/15 1540 04/10/15 0246  INR 2.05* 2.04* 1.89*   BMET  Recent Labs Lab 04/11/15 2315 04/13/15 0410 04/14/15 0339  NA 152* 143 146*  K 4.8 3.2* 3.4*  CL 117* 109 111  CO2 32 28 29  BUN 79* 70* 70*  CREATININE 1.38* 1.27* 1.19  GLUCOSE 158* 404* 181*   Electrolytes  Recent Labs Lab 04/11/15 0500  04/11/15 2315 04/12/15 0340 04/13/15 0410 04/14/15 3790  CALCIUM 11.5*  < > 11.1*  --  10.3 10.3  MG 1.9  --  1.9 1.8  --  1.6*  PHOS 3.4  --   --  2.6  --  3.5  < > = values in this interval not displayed. Sepsis Markers  Recent Labs Lab 04/14/15 0335 04/15/15 0502  PROCALCITON 0.41 0.39   ABG No results for input(s): PHART, PCO2ART, PO2ART in the last 168 hours.   Liver Enzymes  Recent Labs Lab 04/13/15 0410  AST 48*  ALT 40  ALKPHOS 113  BILITOT 0.3  ALBUMIN 1.9*   Cardiac Enzymes No results for input(s): TROPONINI, PROBNP in the last 168 hours.   Glucose  Recent Labs Lab 04/14/15 1955 04/14/15 2059 04/14/15 2208 04/14/15 2308 04/15/15 0011 04/15/15 0114   GLUCAP 189* 159* 187* 174* 147* 152*    7/20 CXR:  1. Interim removal of left IJ line. Remaining lines and tubes in stable position. 2. Interim slight clearing of left lower lobe infiltrate. Small left pleural effusion again noted  ASSESSMENT / PLAN:  PULMONARY A: Acute respiratory failure - in setting of anoxic brain injury Legionella PNA OSA - CPAP prior to admission P:   MV support, 8 cc/kg Daily SBT/WUA as tolerated  Intermittent CXR  levalbuterol q6 hr If family in agreement will proceed with trach  CARDIOVASCULAR A:  Hypertension S/p bradycardic arrest - suspect primary resp event Cardiac markers negative P:  Monitor in ICU MAP goal > 65 mmHg Continue home lipitor, effient per tube Increase lopressor to 25 BID 7/17 Norvasc Hydral PRN for SBP >165  RENAL A:   AKI - likely ATN, improving Hypo/Hypernatremia - resolving Hypokalemia- resolving 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 P:   Monitor BMET / UOP Replace electrolytes as indicated  free water 300 ml Q6 increased 7/17 D/c D5 7/19 Follow up Urine osm DDAVP stared 7/19  GASTROINTESTINAL A:   Loose stools -  C-Diff negative  P:   SUP: enteral pantoprazole TF per nutrition   HEMATOLOGIC A:   Factor V Leiden with h/o PE, 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 Neutropenia INR improving P:  Heparin gtt per pharmacy, hold warfarin while intubated  Monitor Coags  Monitor CBC Transfuse per ICU guidelines Consider heme consult depending of wife's care wishes  INFECTIOUS A:   Legionella CAP  Severe sepsis 2/2 CAP Legionella Fever - new 7/15, 7/16 P:   Monitor temp, WBC count Follow PCT Levaquin 7/14 >> Vanc 7/20 >> Imipenem 7/20>>  Re-culture 7/16 >>  UCx>> NGTD Resp Cx>> few candida else NG BCx>>NGTD  Re-culture 7/20>> Abx as above  ENDOCRINE A:   DM 2, improving P:   Insulin gtt CBG per protocol  Increased free water as above, D5 d/ced 7/19  NEUROLOGIC A:   Acute  Encephalopathy - post respiratory arrest.  MRI and EEG c/w encephalopathy 2/2 Anoxic brain injury Central DI with hyponatremia Neck contusion by CT scan - no spine or cord injury noted P:   RASS goal: 0 Fentanyl infusion Cont C collar until clinical clearance of C spine after extubation Serial neuro exams  Neurology consult- rec supportive care with peg/trach DDAVP as above for central DI    FAMILY  - Update: Palliative care following: Palliatve care met with Mr Gossen's wife and she stated that she did not feel he would want aggressive measure like a peg/trach tube. Will meet with rest of family tomorrow at 4:30 to further discuss care goals  Given current  preliminary decision to withdraw care, will hold from advancing care and only keep current therapies. Will optimize comfort   Alyssa A. Lincoln Brigham MD, [facility] Resident PGY-2 Pager [phone]  No change in mental status.  Still having fever and WBC still low.  Checked cultures and changed Abx.  ?if leukopenia related to effient >> changed to brilinta.  Did not tolerate SBT.  Increased coughing spells when fentanyl off.  Has b/l crackles, abd soft, heart rate regular.  Appreciate help from palliative care.  Will continue current therapies.  No escalation of care.  He is now DNR status.  Palliative care to meet with pt's sons on 7/21 >> likely then transition to comfort care.  D/w Dr. Hilma Favors.  Chesley Mires, MD [facility] Pulmonary/Critical Care 04/15/2015, 4:11 PM Pager:  [phone] After 3pm call: [phone]

## 2015-04-15 NOTE — Progress Notes (Signed)
ANTICOAGULATION CONSULT NOTE - Follow Up Consult  Pharmacy Consult for heparin Indication: Factor V Leiden + history of PE   Labs:  Recent Labs  04/13/15 0410 04/14/15 0339 04/15/15 0502  HGB 9.7* 9.3* 8.7*  HCT 30.9* 30.3* 28.7*  PLT 340 308 319  HEPARINUNFRC 0.37 0.31 0.24*  CREATININE 1.27* 1.19  --      Assessment: 64yo male now subtherapeutic on heparin after several levels at low end of goal; no gtt issues per RN; Hgb slowly trending down though RN notes no overt signs of bleeding.  Goal of Therapy:  Heparin level 0.3-0.7 units/ml   Plan:  Will increase heparin gtt by 2 units/kg/hr to 2900 units/hr and check level in 6hr.  Vernard GamblesVeronda Kydan Shanholtzer, PharmD, BCPS  04/15/2015,6:02 AM

## 2015-04-16 DIAGNOSIS — Z515 Encounter for palliative care: Secondary | ICD-10-CM | POA: Insufficient documentation

## 2015-04-16 LAB — GLUCOSE, CAPILLARY
GLUCOSE-CAPILLARY: 359 mg/dL — AB (ref 65–99)
Glucose-Capillary: 338 mg/dL — ABNORMAL HIGH (ref 65–99)
Glucose-Capillary: 362 mg/dL — ABNORMAL HIGH (ref 65–99)

## 2015-04-16 LAB — HEPARIN LEVEL (UNFRACTIONATED)
Heparin Unfractionated: <0.1 IU/mL — ABNORMAL LOW (ref 0.30–0.70)
Heparin Unfractionated: <0.1 IU/mL — ABNORMAL LOW (ref 0.30–0.70)

## 2015-04-16 LAB — CULTURE, BLOOD (ROUTINE X 2)
Culture: NO GROWTH
Culture: NO GROWTH

## 2015-04-16 LAB — URINE CULTURE: Culture: NO GROWTH

## 2015-04-16 MED ORDER — MIDAZOLAM BOLUS VIA INFUSION
1.0000 mg | INTRAVENOUS | Status: DC | PRN
  Filled 2015-04-16: qty 5

## 2015-04-16 MED ORDER — GLYCOPYRROLATE 0.2 MG/ML IJ SOLN
0.1000 mg | Freq: Two times a day (BID) | INTRAMUSCULAR | Status: DC
  Filled 2015-04-16: qty 0.5

## 2015-04-16 MED ORDER — LEVALBUTEROL HCL 0.63 MG/3ML IN NEBU: 0.6300 mg | INHALATION_SOLUTION | Freq: Four times a day (QID) | RESPIRATORY_TRACT | Status: DC | PRN

## 2015-04-16 MED ORDER — FENTANYL BOLUS VIA INFUSION
50.0000 ug | INTRAVENOUS | Status: DC | PRN
  Filled 2015-04-16: qty 50

## 2015-04-16 MED ORDER — SODIUM CHLORIDE 0.9 % IV SOLN
1.0000 mg/h | INTRAVENOUS | Status: DC
  Administered 2015-04-16: 2 mg/h via INTRAVENOUS
  Filled 2015-04-16: qty 10

## 2015-04-16 MED ORDER — FENTANYL BOLUS VIA INFUSION
50.0000 ug | INTRAVENOUS | Status: DC | PRN
  Filled 2015-04-16: qty 100

## 2015-04-16 MED ORDER — SCOPOLAMINE 1 MG/3DAYS TD PT72
1.0000 | MEDICATED_PATCH | TRANSDERMAL | Status: DC
  Filled 2015-04-16: qty 1

## 2015-04-17 NOTE — Care Management Note (Signed)
Case Management Note  Patient Details  Name: [name] MRN: [id number] Date of Birth: 16-Feb-1951  Subjective/Objective:    Comfort Care - expired.                 Action/Plan:   Expected Discharge Date:                  Expected Discharge Plan:  Skilled Nursing Facility  In-House Referral:  Clinical Social Work  Discharge planning Services  CM Consult  Post Acute Care Choice:    Choice offered to:     DME Arranged:    DME Agency:     HH Arranged:    HH Agency:     Status of Service:  Completed, signed off  Medicare Important Message Given:    Date Medicare IM Given:    Medicare IM give by:    Date Additional Medicare IM Given:    Additional Medicare Important Message give by:     If discussed at Long Length of Stay Meetings, dates discussed:    Additional Comments:  Vangie Bicker, RN 04/17/2015, 7:45 AM

## 2015-04-18 LAB — CULTURE, BLOOD (ROUTINE X 2)

## 2015-04-19 LAB — CULTURE, RESPIRATORY W GRAM STAIN

## 2015-04-19 LAB — CULTURE, RESPIRATORY

## 2015-04-20 LAB — CULTURE, BLOOD (ROUTINE X 2): CULTURE: NO GROWTH

## 2015-04-20 NOTE — Discharge Summary (Signed)
[name] was a 64 y.o. male admitted on 04/04/2015 with cough, fever, chills, and dyspnea.  He was found to have pneumonia.  He was started on antibiotics.  After admission he developed respiratory leading to cardiac arrest.  He was transferred to ICU.  He was intubated.  His legionella urine antigen was positive.  He did not have improvement in his mental status.  Neurology was consulted.  He had MRI brain which showed changes consistent with anoxic injury.  EEG showed diffuse slowing.  He had progressive drop in his WBC and neutrophil count.  He developed recurrent fever.  He developed polyuria and hypernatremia which was concerning for diabetes insipidus.  He was started on DDAVP.  His mental status did not improve.  Palliative care was consulted to discuss goals of care with pt's family.  The family was in agreement that [name] would not want to continue on life support w/o hope for meaningful improvement.  He was made DNR, and transition to comfort measures.  He was extubated on 05/14/15, and subsequently expired at 1824.   Final diagnoses: Legionella Pneumonia Acute respiratory failure Sepsis Acute encephalopathy Coma Leukopenia Febrile neutropenia AKI Hyponatremia Hypernatremia Central diabetes insipidus Hypokalemia Diarrhea Hx of Factor 5 Leiden mutation Hx of CAD Hx of pulmonary embolism Hx of OSA DM type II with peripheral neuropathy Hx of HTN   Coralyn Helling, MD [facility] Pulmonary/Critical Care 04/20/2015, 11:36 PM

## 2015-04-22 ENCOUNTER — Telehealth: Payer: Self-pay

## 2015-04-22 NOTE — Telephone Encounter (Signed)
On 04/22/2015 I received a death certificate from [facility]. The death certificate is for cremation. The patient is a patient of Doctor Sood. The death certificate will be taken to the pulmonary unit tomorrow am for signature. On 05-12-2015 I received the death certificate back from Doctor Delford Field. I got the death certificate ready and called the funeral home to let them know it was ready for pickup.

## 2015-04-23 ENCOUNTER — Ambulatory Visit: Payer: BLUE CROSS/BLUE SHIELD

## 2015-04-23 ENCOUNTER — Other Ambulatory Visit: Payer: BLUE CROSS/BLUE SHIELD

## 2015-04-27 NOTE — Procedures (Signed)
Extubation Procedure Note  Patient Details:   Name: SUSIE POUSSON DOB: 06-19-1951 MRN: [id number]   Airway Documentation:    Oral and ETT suctioned prior to extubation.   Evaluation  O2 sats: pt extubated for withdrawl of care- per order and family request Complications: No apparent complications Patient did tolerate procedure well. Bilateral Breath Sounds: Clear, Rhonchi, Diminished Suctioning: Oral, Airway No, pt not able to speak.  Pt extubated for withdrawal of care per MD order and family request.  Pt appeared comfortable t/o process.  Family at bedside, all questions answered.    Jennette Kettle 04-29-2015, 6:15 PM

## 2015-04-27 NOTE — Progress Notes (Signed)
ANTICOAGULATION CONSULT NOTE - Initial Consult  Pharmacy Consult for Heparin  Indication: Factor V Leiden + History of PE  Allergies  Allergen Reactions  . Piroxicam Other (See Comments)    Blistering on hands  . Rofecoxib Other (See Comments)    REACTION: Reaction not known   Patient Measurements: Height:  (180.3 cm) Weight: 273 lb 2.4 oz (123.9 kg) IBW/kg (Calculated) : 75.3 Vital Signs: Temp: 101.6 F (38.7 C) (07/21 0600) Temp Source: Core (Comment) (07/21 0000) BP: 163/67 mmHg (07/21 0600) Pulse Rate: 111 (07/21 0600)  Labs:  Recent Labs  04/14/15 0339 04/15/15 0502 04/15/15 1310 04/21/2015 0602  HGB 9.3* 8.7*  --   --   HCT 30.3* 28.7*  --   --   PLT 308 319  --   --   HEPARINUNFRC 0.31 0.24* 0.35 <0.10*  CREATININE 1.19  --  1.22  --     Estimated Creatinine Clearance: 85.2 mL/min (by C-G formula based on Cr of 1.22).   Medical History: Past Medical History  Diagnosis Date  . Hyperlipidemia   . SLEEP APNEA, OBSTRUCTIVE 01/16/2009  . ESOPHAGEAL STRICTURE 01/16/2009  . PSORIASIS     uses a cream  . PULMONARY EMBOLISM     takes Coumadin daily  . NEPHROLITHIASIS, HX OF 02/27/2009  . ANEMIA-UNSPECIFIED 04/28/2010  . CAD (coronary artery disease)     a. s/p DES to RCA x2 in 2005. b. s/p DES to CFX 2009. c. s/p DES PTCA of RCA for in stent restenosis 03/2010. d. s/p Promus DES x2 to RCA 07/16/10(recurrent pain with abnormal Myoview 06/2010; residual nonobstructive disease at cath.  . Leukopenia 11/11/2011  . DEPRESSION   . HYPERTENSION   . GERD   . PONV (postoperative nausea and vomiting)   . Peripheral edema   . Peripheral neuropathy   . Arthritis   . Joint pain   . Seasonal allergies   . History of colon polyps   . History of kidney stones   . DIABETES MELLITUS-TYPE II 09/01/2010  . Plavix resistance   . Heterozygous factor V Leiden mutation   . Obesity   . Pneumonia 04-24-2015    Assessment: Undetectable heparin level, heparin is infusing good  per RN  Goal of Therapy:  Heparin level 0.3-0.7 units/ml Monitor platelets by anticoagulation protocol: Yes   Plan:  -Increase heparin to 3200 units/hr -1500 HL -Daily CBC/HL -Monitor for bleeding  Abran Duke 03/29/2015,6:59 AM

## 2015-04-27 NOTE — Progress Notes (Signed)
CRITICAL VALUE ALERT  Critical value received:  Blood culture Aerobic gram positive cocci in cluster   Date of notification: 03/17/15  Time of notification:  0655  Critical value read back:yes  Nurse who received alert:  Julius Bowels, RN  MD notified (1st page):  Dr. Craige Cotta  Time of first page: [phone]

## 2015-04-27 NOTE — Progress Notes (Signed)
Notified Engineer, water Magazine features editor) in regards to patient's past clinical situation of being found down on [employer], which could have been a possible ME case. Volney Presser ME ordered to call risk management to rule out ME case. Notified Risk Management Director Corky Downs and she ordered to notify wife to see if autopsy was requested. Called wife Sharyl Nimrod C. Renae Gloss) and asked if autopsy wanted. Wife did not request one and this was verified with Tory Emerald RN in charge.

## 2015-04-27 NOTE — Progress Notes (Signed)
Notified CDS in regards to pt's time of death. Talked to Bonna Gains from CDS.

## 2015-04-27 NOTE — Progress Notes (Signed)
PULMONARY / CRITICAL CARE MEDICINE   Name: [name] MRN: [id number] DOB: 1951/03/26    ADMISSION DATE:  04/01/2015 CONSULTATION DATE:  04/04/2015  REFERRING MD : Triad  CHIEF COMPLAINT:  AMS  INITIAL PRESENTATION:  64 y/o admitted for community acquired pneumonia was found down in hospital room with bradycardia and apnea. Intubated and PCCM assumed care. PMH includes OSA, DM 2, PE, factor 5 Leiden   MAJOR EVENTS/TEST RESULTS: 7/08  Admitted to telemetry by [facility] with dx of CAP 7/09  early AM: found by RN staf on floor, face down, bradycardic, apneic. Code blue called. Brief chest compressions. No meds. Intubated. Transferred to ICU.PCCM assumed care 7/09  CT head/neck: No evidence of intracranial injury or facial fracture. L neck contusion without evidence of cervical spine injury 7/10  Persistent fever. Leukopenic. Abx expanded. Transitioned off insulin gtt to Lantus and SSI 7/16  No change in mental status, not waking on exam  7/17 Neuro consult- MR Brain 1. Bilateral striatum swelling and probable infarcts, consistent with anoxic injury given the clinical circumstances. 2. Bilateral mastoid effusion. EEG diffuse encephalopathy  INDWELLING DEVICES:: ETT 7/09 >>   MICRO DATA: Blood 7/08 >> neg  Resp 7/09 >>  U legionella 7/8 >> positive  RVP 7/11 >> neg PCT 7/09: 0.74, 7/11: 6.53 BCx2 7/16 >> UA 7/16 >> neg UC 7/16 >> neg  Sputum 7/16 >>few candida else NG BCx 7/20>> UC 7/20 >>  Sputum >>   ANTIMICROBIALS:  Ceftriaxone 7/08 >> 7/10 Azithro 7/08 >> 7/10  Azithro 7/10 >> 7/14 Vanc 7/10 >> 7/12 Ceftaz 7/10 >> 7/14 Levaquin 7/14 >> Imipenem 7/20 >> Vanc 7/20 >>   SUBJECTIVE:   Persistently febrile overnight  VITAL SIGNS: Temp:  [100.4 F (38 C)-102.2 F (39 C)] 102.2 F (39 C) (07/21 0800) Pulse Rate:  [93-123] 116 (07/21 0800) Resp:  [14-32] 23 (07/21 0800) BP: (82-168)/(36-74) 167/66 mmHg (07/21 0800) SpO2:  [97 %-100 %] 97 % (07/21 0800) FiO2 (%):  [40  %] 40 % (07/21 0800)   VENTILATOR SETTINGS: Vent Mode:  [-] PRVC FiO2 (%):  [40 %] 40 % Set Rate:  [16 bmp] 16 bmp Vt Set:  [600 mL] 600 mL PEEP:  [5 cmH20] 5 cmH20 Plateau Pressure:  [19 cmH20-29 cmH20] 23 cmH20   INTAKE / OUTPUT:  Intake/Output Summary (Last 24 hours) at 05-04-2015 0831 Last data filed at May 04, 2015 0800  Gross per 24 hour  Intake 4124.3 ml  Output   4650 ml  Net -525.7 ml    PHYSICAL EXAMINATION: General:  NAD Neuro:  No response to verbal stimuli,  HEENT: healing nasal contusion, cervical collar in place, OETT Cardiovascular:  RRR Lungs: coarse breath sounds, equal air movement bilaterally  Abdomen:  Obese, soft, +BS Ext: mild b/l LE edema, warm  LABS:  CBC  Recent Labs Lab 04/13/15 0410 04/14/15 0339 04/15/15 0502  WBC 0.9* 0.5* 0.4*  HGB 9.7* 9.3* 8.7*  HCT 30.9* 30.3* 28.7*  PLT 340 308 319   Coag's  Recent Labs Lab 04/09/15 1540 04/10/15 0246  INR 2.04* 1.89*   BMET  Recent Labs Lab 04/13/15 0410 04/14/15 0339 04/15/15 1310  NA 143 146* 149*  K 3.2* 3.4* 4.0  CL 109 111 115*  CO2 _0 BUN 70* 70* 68*  CREATININE 1.27* 1.19 1.22  GLUCOSE 404* 181* 189*   Electrolytes  Recent Labs Lab 04/11/15 0500  04/11/15 2315 04/12/15 0340 04/13/15 0410 04/14/15 0339 04/15/15 1310  CALCIUM 11.5*  < >  11.1*  --  10.3 10.3 10.3  MG 1.9  --  1.9 1.8  --  1.6*  --   PHOS 3.4  --   --  2.6  --  3.5  --   < > = values in this interval not displayed. Sepsis Markers  Recent Labs Lab 04/14/15 0335 04/15/15 0502  PROCALCITON 0.41 0.39   ABG No results for input(s): PHART, PCO2ART, PO2ART in the last 168 hours.   Liver Enzymes  Recent Labs Lab 04/13/15 0410  AST 48*  ALT 40  ALKPHOS 113  BILITOT 0.3  ALBUMIN 1.9*   Cardiac Enzymes No results for input(s): TROPONINI, PROBNP in the last 168 hours.   Glucose  Recent Labs Lab 04/15/15 1457 04/15/15 1601 04/15/15 1946 04-22-2015 0008 22-Apr-2015 0403 04-22-2015 0744   GLUCAP 173* 173* 278* 362* 338* 359*    7/20 CXR:  1. Interim removal of left IJ line. Remaining lines and tubes in stable position. 2. Interim slight clearing of left lower lobe infiltrate. Small left pleural effusion again noted  ASSESSMENT / PLAN:  PULMONARY A: Acute respiratory failure - in setting of anoxic brain injury Legionella PNA OSA - CPAP prior to admission P:   MV support, 8 cc/kg Daily SBT/WUA as tolerated  Intermittent CXR  levalbuterol q6 hr   CARDIOVASCULAR A:  Hypertension S/p bradycardic arrest - suspect primary resp event Cardiac markers negative P:  MAP goal > 65 mmHg effient per tube  lopressor to 25 BID 7/17 D/c Norvasc, effient Hydral PRN for SBP >165  RENAL A:   AKI - likely ATN, improving Hypo/Hypernatremia - resolving Hypokalemia- resolving 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 P:   D/c  BMET monitoring Replace electrolytes as indicated  D/c free water 300 ml Q6 increased 7/21 D/c D5 7/19 DDAVP 7/19>> 7/21  GASTROINTESTINAL A:   Loose stools -  C-Diff negative  P:   SUP: enteral pantoprazole TF per nutrition   HEMATOLOGIC A:   Factor V Leiden with h/o PE, 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 Neutropenia INR improving P:  Heparin gtt per pharmacy, hold warfarin while intubated  D/c cbc monitoring Transfuse per ICU guidelines Will hold on heme consult given Wife's wishes to with draw care  INFECTIOUS A:   Legionella CAP  Severe sepsis 2/2 CAP Legionella Fever - new 7/15, 7/16 P:   Monitor temp, WBC count Follow PCT Levaquin 7/14 >> Vanc 7/20 >> Imipenem 7/20>>  Re-culture 7/16 >>  UCx>> NGTD Resp Cx>> few candida else NG BCx>>NGTD  Re-culture 7/20>> BCx >> GPC 7/21 Abx as above   ENDOCRINE A:   DM 2, improving P:   Insulin gtt CBG per protocol  D5 d/ced 7/19  NEUROLOGIC A:   Acute Encephalopathy - post respiratory arrest.  MRI and EEG c/w encephalopathy 2/2 Anoxic brain injury Central DI with  hyponatremia Neck contusion by CT scan - no spine or cord injury noted P:   RASS goal: 0 Fentanyl infusion Cont C collar until clinical clearance of C spine after extubation Serial neuro exams  Neurology consult- rec supportive care with peg/trach DDAVP as above for central DI    FAMILY  - Update: Palliative care following: Palliatve care met with Mr Arterberry's wife and she stated that she did not feel he would want aggressive measure like a peg/trach tube. Will meet with rest of family today at 4:30 to discuss withdrawing care today  Will optimize comfort in anticipation of withdrawing care today  Alyssa A.  Lincoln Brigham MD, [facility] Resident PGY-2 Pager [phone]  Spoke with pts wife and son.  Answered their questions.  They have family meeting with palliative care this PM >> likely transition to comfort measures after this.  Chesley Mires, MD [facility] Pulmonary/Critical Care 04/21/15, 3:22 PM Pager:  [phone] After 3pm call: [phone]

## 2015-04-27 NOTE — Progress Notes (Signed)
Family Meeting Palliative Medicine Team  Met with Wife and his three children. Discussed his current condition and very poor prognosis. I answered all of their questions. All are in agreement for compassionate extubation and to allow for a peaceful death to occur. I provided information on that process. RN present for meeting. Family would like for his grand kids to be able to see him without being connected to all of the ICU devices and tubes. Family will notify RN when all family has arrived and when to proceed with extubation and transition to comfort care. Removal of mechanical ventilation and comfort care orders placed.  Palliative will follow- anticipate a hospital death.  Time:415-5:10 Total: 55 minutes  Lane Hacker, [place]

## 2015-04-27 NOTE — Progress Notes (Signed)
Pt in obvious distress post extubation despite maximum dose of fentanyl and versed. Dose increased to 450mcg/hr of fentanyl, bolus of fentanyl, and /hr of versed. [facility] notified and RN given appropriate orders.

## 2015-04-27 NOTE — Progress Notes (Addendum)
Patient time of death 73. Confirmed with Barbaraann Faster RN that patient had no heart beat or breath sounds. ELINK notified at 1830. EKG strip showing asystole in shadow chart.

## 2015-04-27 NOTE — Progress Notes (Signed)
Wasted and 175 mls of Versed and Fentanyl IV drips respectively in sink, witnessed my Social research officer, government.

## 2015-04-27 NOTE — Progress Notes (Signed)
Inpatient Diabetes Program Recommendations  AACE/ADA: New Consensus Statement on Inpatient Glycemic Control (2013)  Target Ranges:  Prepandial:   less than 140 mg/dL      Peak postprandial:   less than 180 mg/dL (1-2 hours)      Critically ill patients:  140 - 180 mg/dL   Results for DORSEL, FLINN (MRN [id number]) as of 03/29/2015 08:45  Ref. Range 04/15/2015 14:57 04/15/2015 16:01 04/15/2015 19:46 03/27/2015 00:08 04/17/2015 04:03 04/08/2015 07:44  Glucose-Capillary Latest Ref Range: 65-99 mg/dL 409 (H) 811 (H) 914 (H) 362 (H) 338 (H) 359 (H)   Diabetes history: DM2 Outpatient Diabetes medications: Levemir 60 units BID, Novolog 44 units TID with meals Current orders for Inpatient glycemic control: Novolog 0-20 units Q4H  Inpatient Diabetes Program Recommendations Insulin - Basal:  Glucose has ranged from 173-362 mg/dl since being transitioned off IV insulin yesterday. Noted that at time of transition off IV insulin to SQ insulin the insulin drip was 9.4 units/hour and no basal insulin or tube feeding coverage was given or ordered. As a result, glucose has continued to increase (over 338 mg/dl since midnight). At this time, strongly recommend discontinuing current regular Glycemic Control order set and order ICU Glycemic Control order set Phase 2 (IV insulin).  Thanks, Orlando Penner, RN, MSN, CCRN, CDE Diabetes Coordinator Inpatient Diabetes Program [phone] (Team Pager from 8am to 5pm) [phone] ([facility] office) [phone] [facility] office) [phone] [facility] office)

## 2015-04-27 NOTE — Progress Notes (Signed)
Pt 02 sat decreased to lower 80% nurse entered room, notice pt to have symptoms of a seizure. Pt had R face, R leg and arm twitching. Elink MD notified(Dr.Wright) not new orders given at this time. Pt given PRN Ativan

## 2015-04-27 NOTE — Consult Note (Signed)
Consultation Note Date: Apr 17, 2015   Patient Name: [name]  DOB: 01-13-1951  MRN: [id number]  Age / Sex: 64 y.o., male   PCP: Marin Olp, MD Referring Physician: Chesley Mires, MD  Reason for Consultation: Establishing goals of care and Terminal care  Palliative Care Assessment and Plan Summary of Established Goals of Care and Medical Treatment Preferences   Clinical Assessment/Narrative: 64 yo man admitted with PNA, subsequently suffered a cardiac arrest during hospitalization and now has anoxic brain injury. He has made minimal neurological improvement and has a very poor prognosis for meaningful recovery. PMT consulted for goals of care.  I met with his wife and other family. Children were not present for goals meeting. Wife is ready to shift to full comfort and is requesting I meet with her children tomorrow. Planning on re-meet at 430PM.  I discussed "big picture" prognosis and what the likely trajectory will be- either way this condition will take his life- I suspect even with continued aggressive support and intervention the road would be long and have numerous complications- I strongly suspect he would never make the kind of recovery that would be acceptable to him based on my discussion with his family today. He was an active outgoing man with a strong independent personality.  Contacts/Participants in Discussion: Primary Decision Maker: Wife, [name]   HCPOA: no  Wife [name], Niece, nephew and family friends He has three children who are not handling his situation very well. One son has autism/high functioning. He also has three Grandchildren.  Code Status/Advance Care Planning:  DNR, discussed and confirmed today- no escalation of current interventions  Symptom Management:   Intubated, not sedated, on low dose fentanyl infusion for pain  Palliative Prophylaxis: ICU pain assessments and bowel regimen  Additional Recommendations (Limitations, Scope,  Preferences):  Mr. Kaley has clearly stated in teh past that he would not want to live a debilitated life- he care for his brother who had a prolonged severely disabled life. 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  Desire for further Chaplaincy support:yes  Prognosis: Anticipate terminal wean in next 24-48 hours  Discharge Planning:  Likely a hospital death       Chief Complaint/History of Present Illness: 64 yo man admitted with Legionella PNA  Primary Diagnoses  Present on Admission:  . Legionella pneumonia . Chronic rhinitis . Depression . Essential hypertension . Factor V Leiden . Fever and chills . H/o Pulmonary embolism (Factor V Leiden heterozygous) . Hyperlipidemia . Leukopenia . 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 OSA on CPAP . Poorly controlled diabetes mellitus  Palliative Review of Systems: Unable to ibtain I have reviewed the medical record, interviewed the patient and family, and examined the patient. The following aspects are pertinent.  Past Medical History  Diagnosis Date  . Hyperlipidemia   . SLEEP APNEA, OBSTRUCTIVE 01/16/2009  . ESOPHAGEAL STRICTURE 01/16/2009  . PSORIASIS     uses a cream  . PULMONARY EMBOLISM     takes Coumadin daily  . NEPHROLITHIASIS, HX OF 02/27/2009  . ANEMIA-UNSPECIFIED 04/28/2010  . CAD (coronary artery disease)     a. s/p DES to RCA x2 in 2005. b. s/p DES to CFX 2009. c. s/p DES PTCA  of RCA for in stent restenosis 03/2010. d. s/p Promus DES x2 to RCA 07/16/10(recurrent pain with abnormal Myoview 06/2010; residual nonobstructive disease at cath.  . Leukopenia 11/11/2011  . DEPRESSION   . HYPERTENSION   . GERD   . PONV  (postoperative nausea and vomiting)   . Peripheral edema   . Peripheral neuropathy   . Arthritis   . Joint pain   . Seasonal allergies   . History of colon polyps   . History of [name] stones   . DIABETES MELLITUS-TYPE II 09/01/2010  . Plavix resistance   . Heterozygous factor V Leiden mutation   . Obesity   . Pneumonia 03/30/2015   History   Social History  . Marital Status: Married    Spouse Name: N/A  . Number of Children: N/A  . Years of Education: N/A   Occupational History  . Real Estate Appraisal     Social History Main Topics  . Smoking status: Never Smoker   . Smokeless tobacco: Never Used  . Alcohol Use: No  . Drug Use: No  . Sexual Activity: Yes   Other Topics Concern  . None   Social History Narrative   Married. 2 children. 3 grandkids.       Works as Sports coach employed Clinical cytogeneticist      Hobbies: [place], Higher education careers adviser, riding Publishing copy   Family History  Problem Relation Age of Onset  . Pulmonary embolism Mother   . Factor V Leiden deficiency Mother   . Diabetes Brother   . Heart failure Brother   . Hypertension Brother   . Asthma Mother   . Factor V Leiden deficiency Brother    Scheduled Meds: . amLODipine  5 mg Per NG tube Daily  . antiseptic oral rinse  7 mL Mouth Rinse QID  . atorvastatin  40 mg Per Tube q1800  . chlorhexidine  15 mL Mouth Rinse BID  . desmopressin  1 mcg Intravenous BID  . feeding supplement (PRO-STAT SUGAR FREE 64)  60 mL Per Tube 5 X Daily  . feeding supplement (VITAL HIGH PROTEIN)  1,000 mL Per Tube Q24H  . free water  300 mL Per Tube 4 times per day  . imipenem-cilastatin  500 mg Intravenous 4 times per day  . insulin aspart  0-20 Units Subcutaneous 6 times per day  . levalbuterol  0.63 mg Nebulization Q6H  . levofloxacin (LEVAQUIN) IV  750 mg Intravenous Q24H  . metoprolol tartrate  25 mg Per Tube BID  . pantoprazole sodium  40 mg Per Tube Q1200  . potassium chloride  40 mEq Per Tube Daily  . sodium chloride  10-40  mL Intracatheter Q12H  . ticagrelor  90 mg Per Tube BID  . vancomycin  1,000 mg Intravenous Q12H   Continuous Infusions: . sodium chloride 10 mL/hr at 04/10/15 1839  . fentaNYL infusion INTRAVENOUS 50 mcg/hr (May 16, 2015 0100)  . heparin 3,200 Units/hr (2015-05-16 0800)   PRN Meds:.acetaminophen (TYLENOL) oral liquid 160 mg/5 mL, fentaNYL, hydrALAZINE, LORazepam, sodium chloride Medications Prior to Admission:  Prior to Admission medications   Medication Sig Start Date End Date Taking? Authorizing Provider  atorvastatin (LIPITOR) 40 MG tablet TAKE 1 TABLET (40 MG TOTAL) BY MOUTH DAILY. 01/22/15  Yes Marin Olp, MD  B-D ULTRAFINE III SHORT PEN 31G X 8 MM MISC USE AS DIRECTED TWICE A DAY 01/06/15  Yes Marin Olp, MD  cetirizine (ZYRTEC) 10 MG tablet Take 10 mg by mouth daily.   Yes  Historical Provider, MD  chlorthalidone (HYGROTON) 25 MG tablet Take 25 mg by mouth every morning.   Yes Historical Provider, MD  citalopram (CELEXA) 20 MG tablet TAKE 1 TABLET BY MOUTH EVERY DAY 02/11/15  Yes Marin Olp, MD  clobetasol cream (TEMOVATE) 6.56 % Apply 1 application topically 2 (two) times daily as needed (for rash).   Yes Historical Provider, MD  doxycycline (VIBRA-TABS) 100 MG tablet Take 1 tablet (100 mg total) by mouth 2 (two) times daily. 04/15/2015  Yes Tanda Rockers, MD  EFFIENT 5 MG TABS tablet TAKE 1 TABLET BY MOUTH DAILY 01/20/15  Yes Sherren Mocha, MD  fluticasone [facility]) 50 MCG/ACT nasal spray Place 2 sprays into both nostrils daily. 09/03/13  Yes Kennyth Arnold, FNP  furosemide (LASIX) 20 MG tablet Take 20 mg by mouth continuous as needed for fluid or edema (Take one tablet  by mouth as needed for fluid rentinion).   Yes Historical Provider, MD  gabapentin (NEURONTIN) 300 MG capsule Take 1 capsule (300 mg total) by mouth 3 (three) times daily. 10/21/14  Yes Tanda Rockers, MD  glipiZIDE (GLUCOTROL) 10 MG tablet Take 10 mg by mouth 2 (two) times daily. 12/30/14  Yes Historical Provider,  MD  Insulin Detemir (LEVEMIR FLEXTOUCH) 100 UNIT/ML Pen INJECT 60 UNITS INTO THE SKIN 2 (TWO) TIMES DAILY. OR AS DIRECTED Patient taking differently: INJECT 65 UNITS INTO THE SKIN 2 (TWO) TIMES DAILY. OR AS DIRECTED 02/27/15  Yes Marin Olp, MD  losartan (COZAAR) 100 MG tablet Take 100 mg by mouth daily. 01/06/15  Yes Historical Provider, MD  metoprolol succinate (TOPROL-XL) 25 MG 24 hr tablet Take 1 tablet (25 mg total) by mouth daily. 01/14/15  Yes Marin Olp, MD  Multiple Vitamin (MULTIVITAMIN) capsule Take 1 capsule by mouth daily.   Yes Historical Provider, MD  nitroGLYCERIN (NITROSTAT) 0.4 MG SL tablet Place 0.4 mg under the tongue every 5 (five) minutes as needed for chest pain.   Yes Historical Provider, MD  NOVOLOG 100 UNIT/ML injection INJECT 44 UNITS INTO THE SKIN 3 (THREE) TIMES DAILY BEFORE MEALS. 08/25/14  Yes Marin Olp, MD  nystatin cream (MYCOSTATIN) Apply 1 application topically as needed (Patient stated this is PRN for yeast infection).  04/16/14  Yes Historical Provider, MD  Omega-3 Fatty Acids (FISH OIL) 1200 MG CAPS Take 1 capsule by mouth daily.   Yes Historical Provider, MD  omeprazole (PRILOSEC) 20 MG capsule TAKE 1 CAPSULE (20 MG TOTAL) BY MOUTH DAILY. 01/22/15  Yes Marin Olp, MD  Potassium Citrate 15 MEQ (1620 MG) TBCR Take 1 tablet by mouth 3 (three) times daily.  12/06/13  Yes Historical Provider, MD  valsartan (DIOVAN) 40 MG tablet Take 1 tablet (40 mg total) by mouth daily. 01/21/15  Yes Sherren Mocha, MD  warfarin (COUMADIN) 5 MG tablet TAKE AS DIRECTED BY ANTICOAGULATION CLINIC Patient taking differently: TAKE AS DIRECTED BY ANTICOAGULATION CLINIC. pt takes 103m everyday except 7.534mon Wednesdays 08/27/14  Yes StMarin OlpMD   Allergies  Allergen Reactions  . Piroxicam Other (See Comments)    Blistering on hands  . Rofecoxib Other (See Comments)    REACTION: Reaction not known   CBC:    Component Value Date/Time   WBC 0.4* 04/15/2015  0502   WBC 4.0 08/23/2010 1206   HGB 8.7* 04/15/2015 0502   HGB 12.7* 08/23/2010 1206   HCT 28.7* 04/15/2015 0502   HCT 37.5* 08/23/2010 1206   PLT 319 04/15/2015 0502  PLT 196 08/23/2010 1206   MCV 87.5 04/15/2015 0502   MCV 81.7 08/23/2010 1206   NEUTROABS 0.8* 04/10/2015 0246   NEUTROABS 2.8 08/23/2010 1206   LYMPHSABS 0.3* 04/10/2015 0246   LYMPHSABS 0.9 08/23/2010 1206   MONOABS 0.0* 04/10/2015 0246   MONOABS 0.1 08/23/2010 1206   EOSABS 0.1 04/10/2015 0246   EOSABS 0.2 08/23/2010 1206   BASOSABS 0.0 04/10/2015 0246   BASOSABS 0.1 08/23/2010 1206   Comprehensive Metabolic Panel:    Component Value Date/Time   NA 149* 04/15/2015 1310   K 4.0 04/15/2015 1310   CL 115* 04/15/2015 1310   CO2 27 04/15/2015 1310   BUN 68* 04/15/2015 1310   CREATININE 1.22 04/15/2015 1310   GLUCOSE 189* 04/15/2015 1310   GLUCOSE 103* 08/02/2006 1129   CALCIUM 10.3 04/15/2015 1310   CALCIUM 10.1 12/04/2012 1433   AST 48* 04/13/2015 0410   ALT 40 04/13/2015 0410   ALKPHOS 113 04/13/2015 0410   BILITOT 0.3 04/13/2015 0410   PROT 6.1* 04/13/2015 0410   ALBUMIN 1.9* 04/13/2015 0410    Physical Exam: Vital Signs: BP 158/64 mmHg  Pulse 113  Temp(Src) 102.1 F (38.9 C) (Core (Comment))  Resp 20  Ht _0  (1.803 m)  Wt 123.9 kg (273 lb 2.4 oz)  BMI 38.11 kg/m2  SpO2 98% SpO2: SpO2: 98 % O2 Device: O2 Device: Ventilator O2 Flow Rate: O2 Flow Rate (L/min): 2 L/min Intake/output summary:  Intake/Output Summary (Last 24 hours) at 04-30-15 0809 Last data filed at 30-Apr-2015 0800  Gross per 24 hour  Intake 3813.3 ml  Output   4650 ml  Net -836.7 ml   LBM: Last BM Date: 04/15/15 Baseline Weight: Weight: 122.925 kg (271 lb) Most recent weight: Weight: 123.9 kg (273 lb 2.4 oz)  Exam Findings:  Critically ill. Pupils sluggish. Does not follow commands. Intubated. +anasarca.         Palliative Performance Scale: 10           Additional Data Reviewed: Recent Labs     04/14/15  0339   04/15/15  0502  04/15/15  1310  WBC  0.5*  0.4*   --   HGB  9.3*  8.7*   --   PLT  308  319   --   NA  146*   --   149*  BUN  70*   --   68*  CREATININE  1.19   --   1.22     Time In: 9AM   Time Out: 11AM Time Total:  120 min Greater than 50%  of this time was spent counseling and coordinating care related to the above assessment and plan.  Signed by: Roma Schanz, DO  Apr 30, 2015, 8:09 AM  Please contact Palliative Medicine Team phone at [phone] for questions and concerns.

## 2015-04-27 NOTE — Progress Notes (Signed)
Pt family asked if they required chaplain services. Family refused.

## 2015-04-27 DEATH — deceased

## 2015-06-25 ENCOUNTER — Ambulatory Visit: Payer: BLUE CROSS/BLUE SHIELD | Admitting: Family Medicine

## 2016-05-13 IMAGING — CT CT HEAD W/O CM
4 of 9 series · 17 of 47 positions shown, 19 images · non-contrast
Comparison: 05/19/2014 head CT

CLINICAL DATA: Unwitnessed fall.

EXAM:
CT HEAD WITHOUT CONTRAST
CT MAXILLOFACIAL WITHOUT CONTRAST
CT CERVICAL SPINE WITHOUT CONTRAST
TECHNIQUE: Multidetector CT imaging of the head, cervical spine, and
maxillofacial structures were performed using the standard protocol
without intravenous contrast. Multiplanar CT image reconstructions
of the cervical spine and maxillofacial structures were also
generated.

[Series 3: facial/ orbits 2.0 h30s · axial · 0.35mm/px · z∈[-346,-198]mm · 8 of 96 slices shown, 10 images]
[im 11/96  brain]
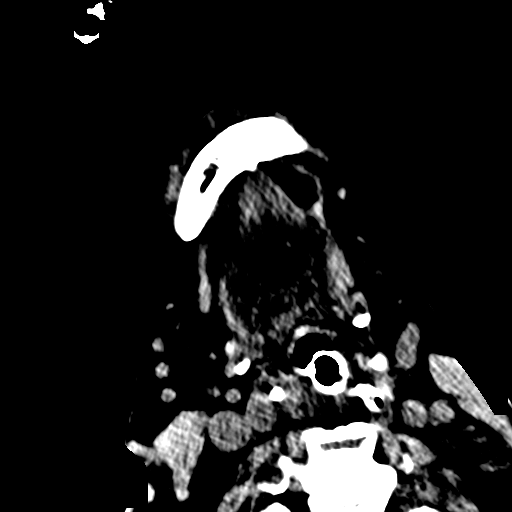
[im 11/96  bone]
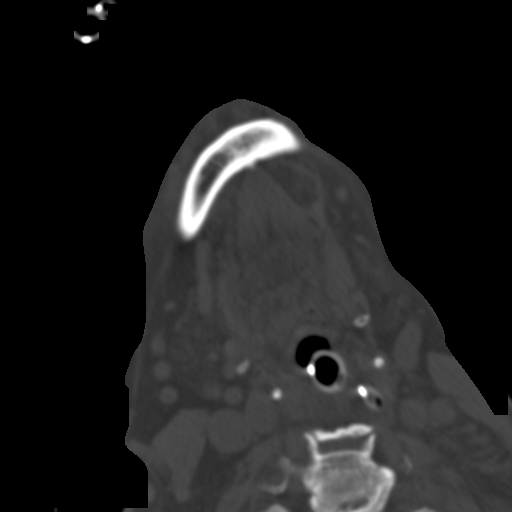
[im 22/96  brain]
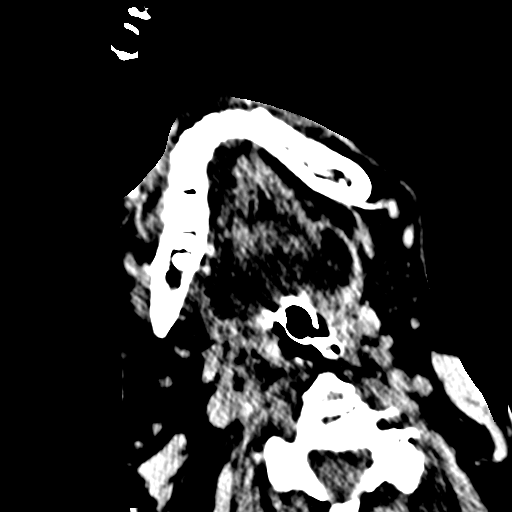
[im 32/96  brain]
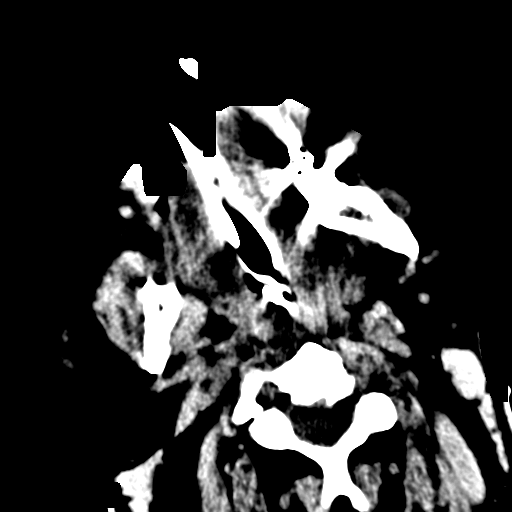
[im 43/96  brain]
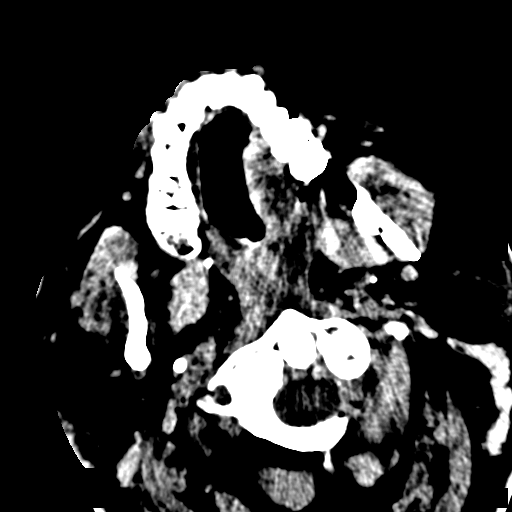
[im 53/96  brain]
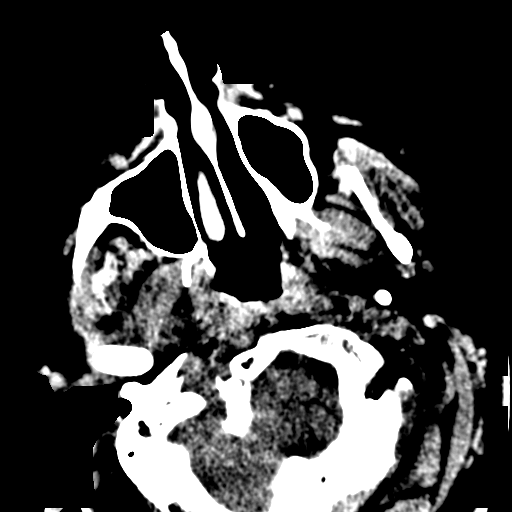
[im 53/96  bone]
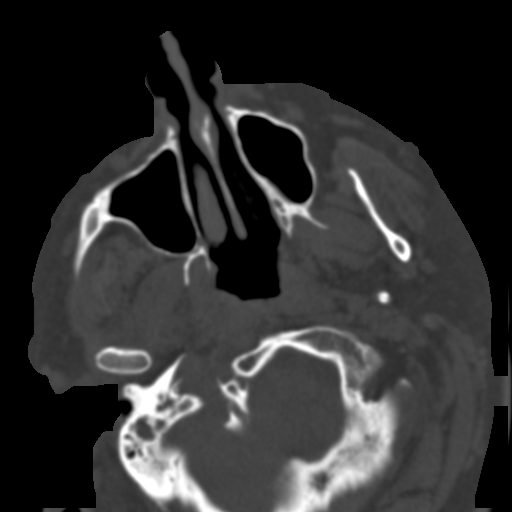
[im 64/96  brain]
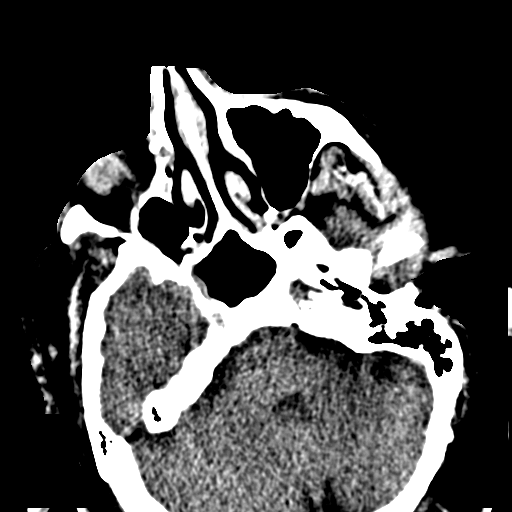
[im 74/96  brain]
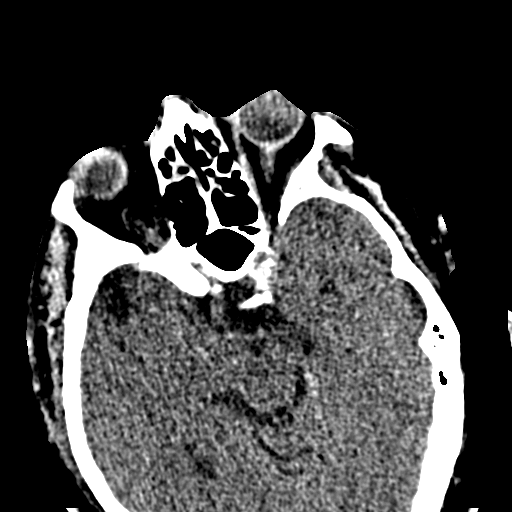
[im 85/96  brain]
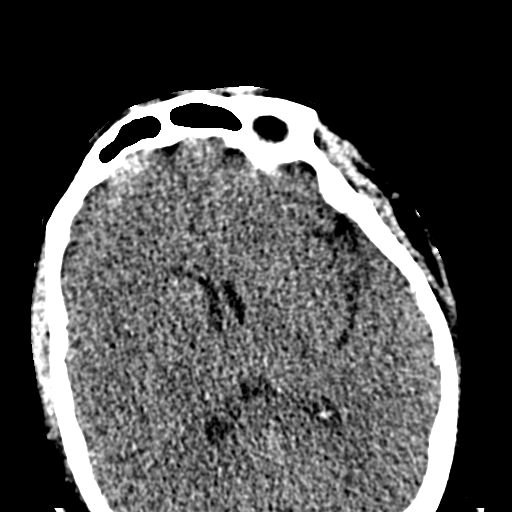

[Series 5: head 2.0 h70h · axial · 0.43mm/px · z∈[-246,-184]mm · 4 of 73 slices shown]
[im 11/73  brain]
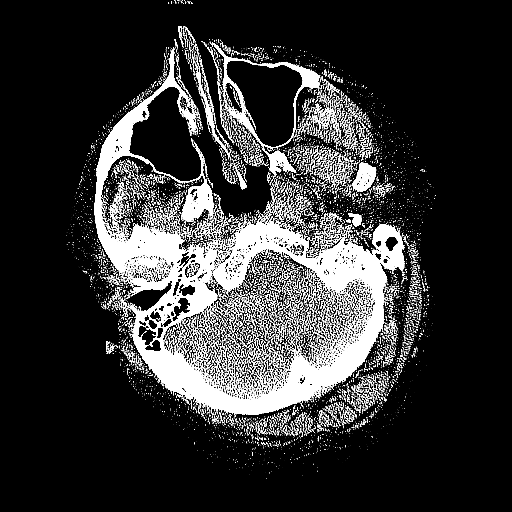
[im 21/73  brain]
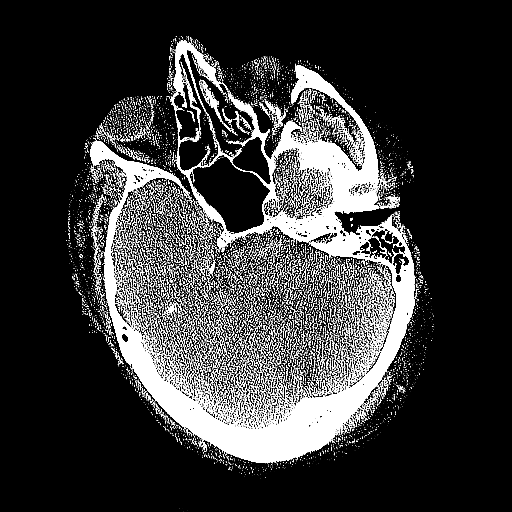
[im 31/73  brain]
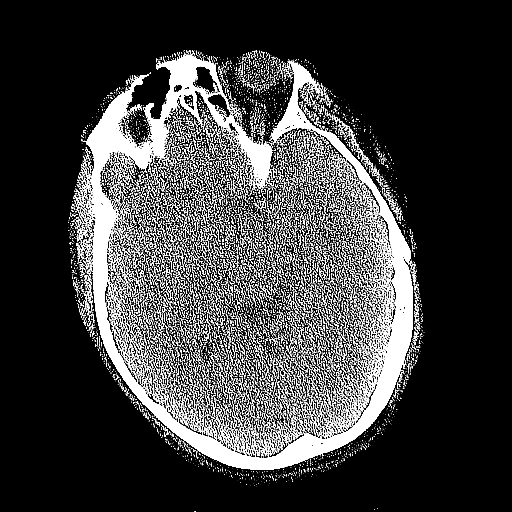
[im 42/73  brain]
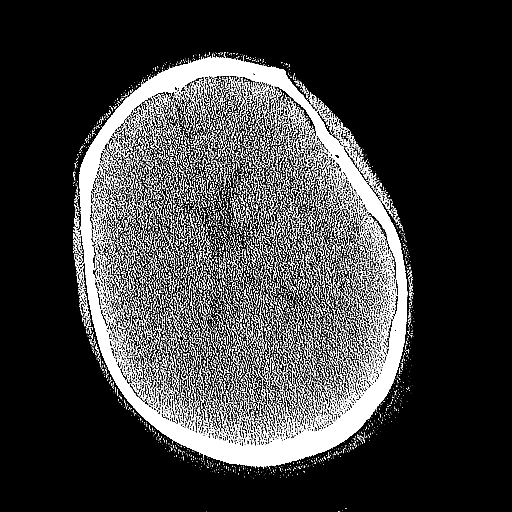

[Series 603: st coronal · coronal · 0.38mm/px · 3 of 63 slices shown]
[im 16/63  brain]
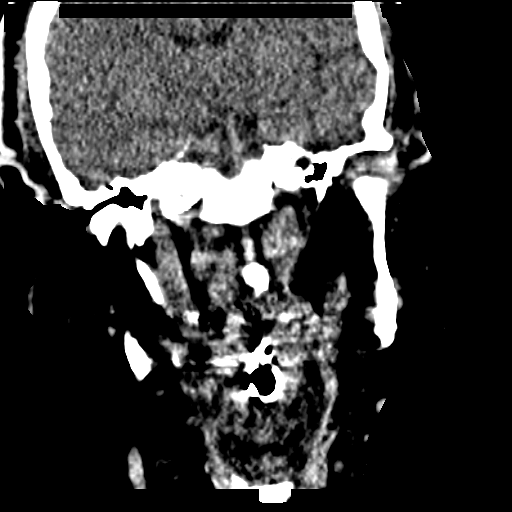
[im 32/63  brain]
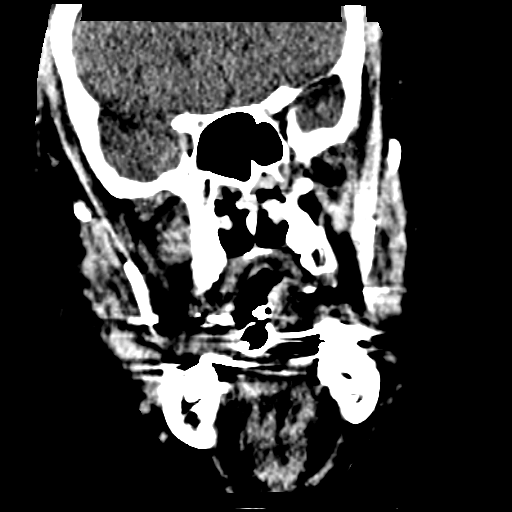
[im 47/63  brain]
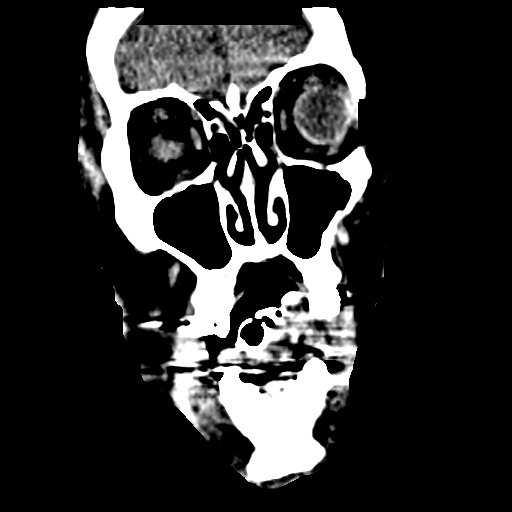

[Series 604: sagittal st · sagittal · 0.38mm/px · 2 of 85 slices shown]
[im 29/85  brain]
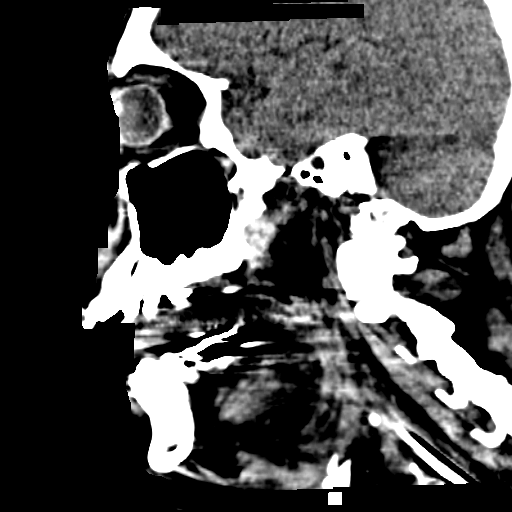
[im 57/85  brain]
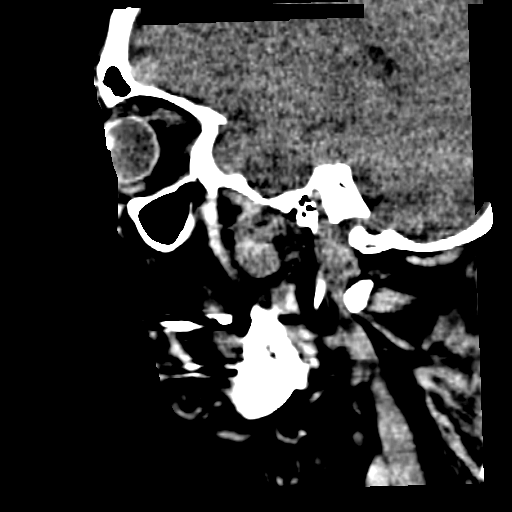

[17 of 47 positions shown; findings below may reference images not displayed]

FINDINGS: CT HEAD FINDINGS

Skull and Sinuses:Negative for fracture or destructive process. The
mastoids, middle ears, and imaged paranasal sinuses are clear.

Orbits: No acute abnormality.

Brain: No evidence of acute infarction, hemorrhage, hydrocephalus,
or mass lesion/mass effect. Calcified atherosclerosis seen in the
right vertebral artery.

CT MAXILLOFACIAL FINDINGS

When accounting for mandibular motion there is no evidence of facial
fracture or mandibular dislocation. No evidence of globe injury or
postseptal hematoma. The paranasal sinuses are clear.

CT CERVICAL SPINE FINDINGS

Negative for acute fracture or subluxation. No prevertebral edema.
No gross cervical canal hematoma.

There is multi focal ankylosis of vertebral bodies and posterior
elements with spondylotic spurring. Rib heads are also fused where
seen in the upper thoracic spine.

Subcutaneous edema in the left neck consistent with contusion.

Bilateral thyroid nodules, the most discrete on the left measuring 2
cm from the lower pole, stable from chest CT 12/26/2012.
IMPRESSION: 1. No evidence of intracranial injury or facial fracture.
2. Left neck contusion without evidence of cervical spine injury.
3. Spondyloarthropathy with ankylosis.
4. Thyroid nodules, stable from chest CT 12/26/2012.

## 2016-05-21 IMAGING — CR DG CHEST 1V PORT
1 series · 1 of 1 positions shown · non-contrast
Comparison: 04/06/2015

CLINICAL DATA: Acute respiratory failure.

EXAM:
PORTABLE CHEST - 1 VIEW

[AP]
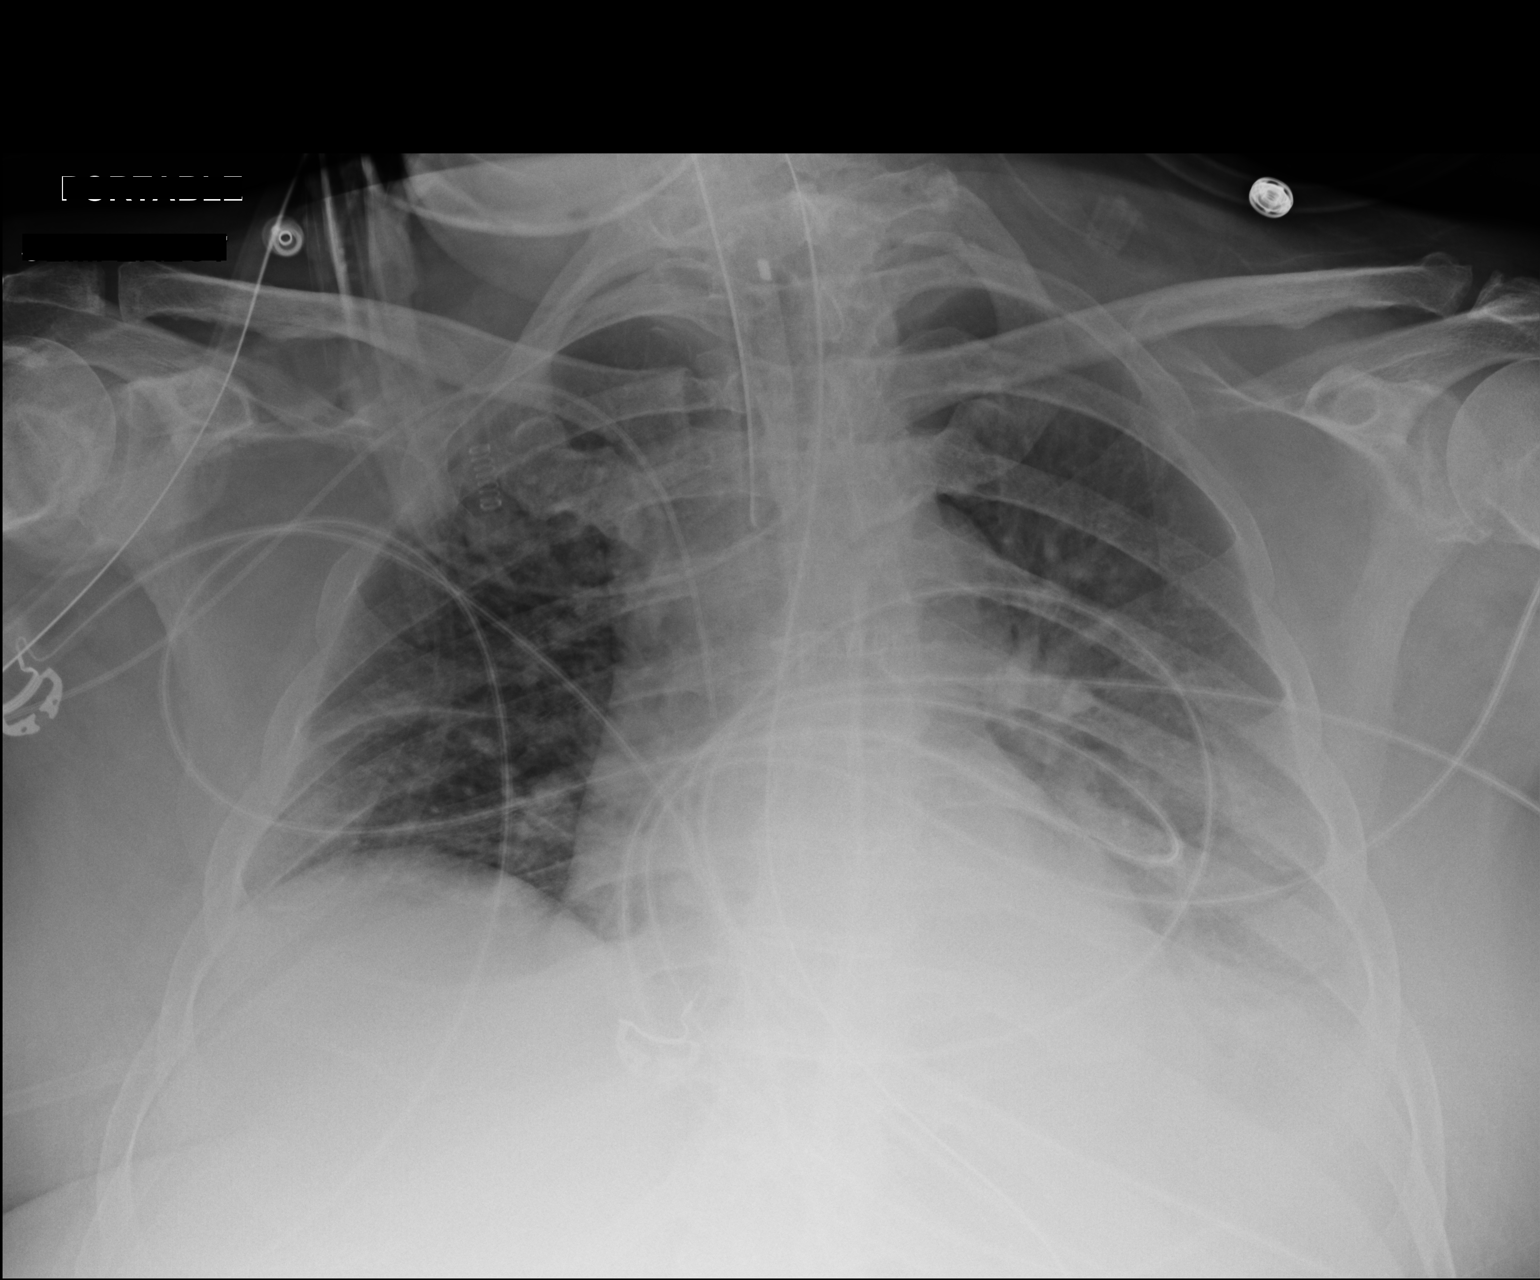

[1 of 1 positions shown; findings below may reference images not displayed]

FINDINGS: Endotracheal tube is in place with tip approximately 4.3 cm above
carina. Nasogastric tube is in place with tip beyond the film but
beyond the gastroesophageal junction. Right sided PICC line catheter
tip overlies the superior vena cava.

There is significant left base opacity which obscures the
hemidiaphragm. There is associated left-sided pleural effusion.
There has been slight interval improvement in the aeration of the
right lung base.
IMPRESSION: 1. Persistent dense left lung base opacity and pleural effusion.
2. Slight improvement in aeration of the right lung base.

## 2016-05-22 IMAGING — CR DG CHEST 1V PORT
1 series · 1 of 1 positions shown · non-contrast
Comparison: 04/12/2015.

CLINICAL DATA: Respiratory failure.

EXAM:
PORTABLE CHEST - 1 VIEW

[AP]
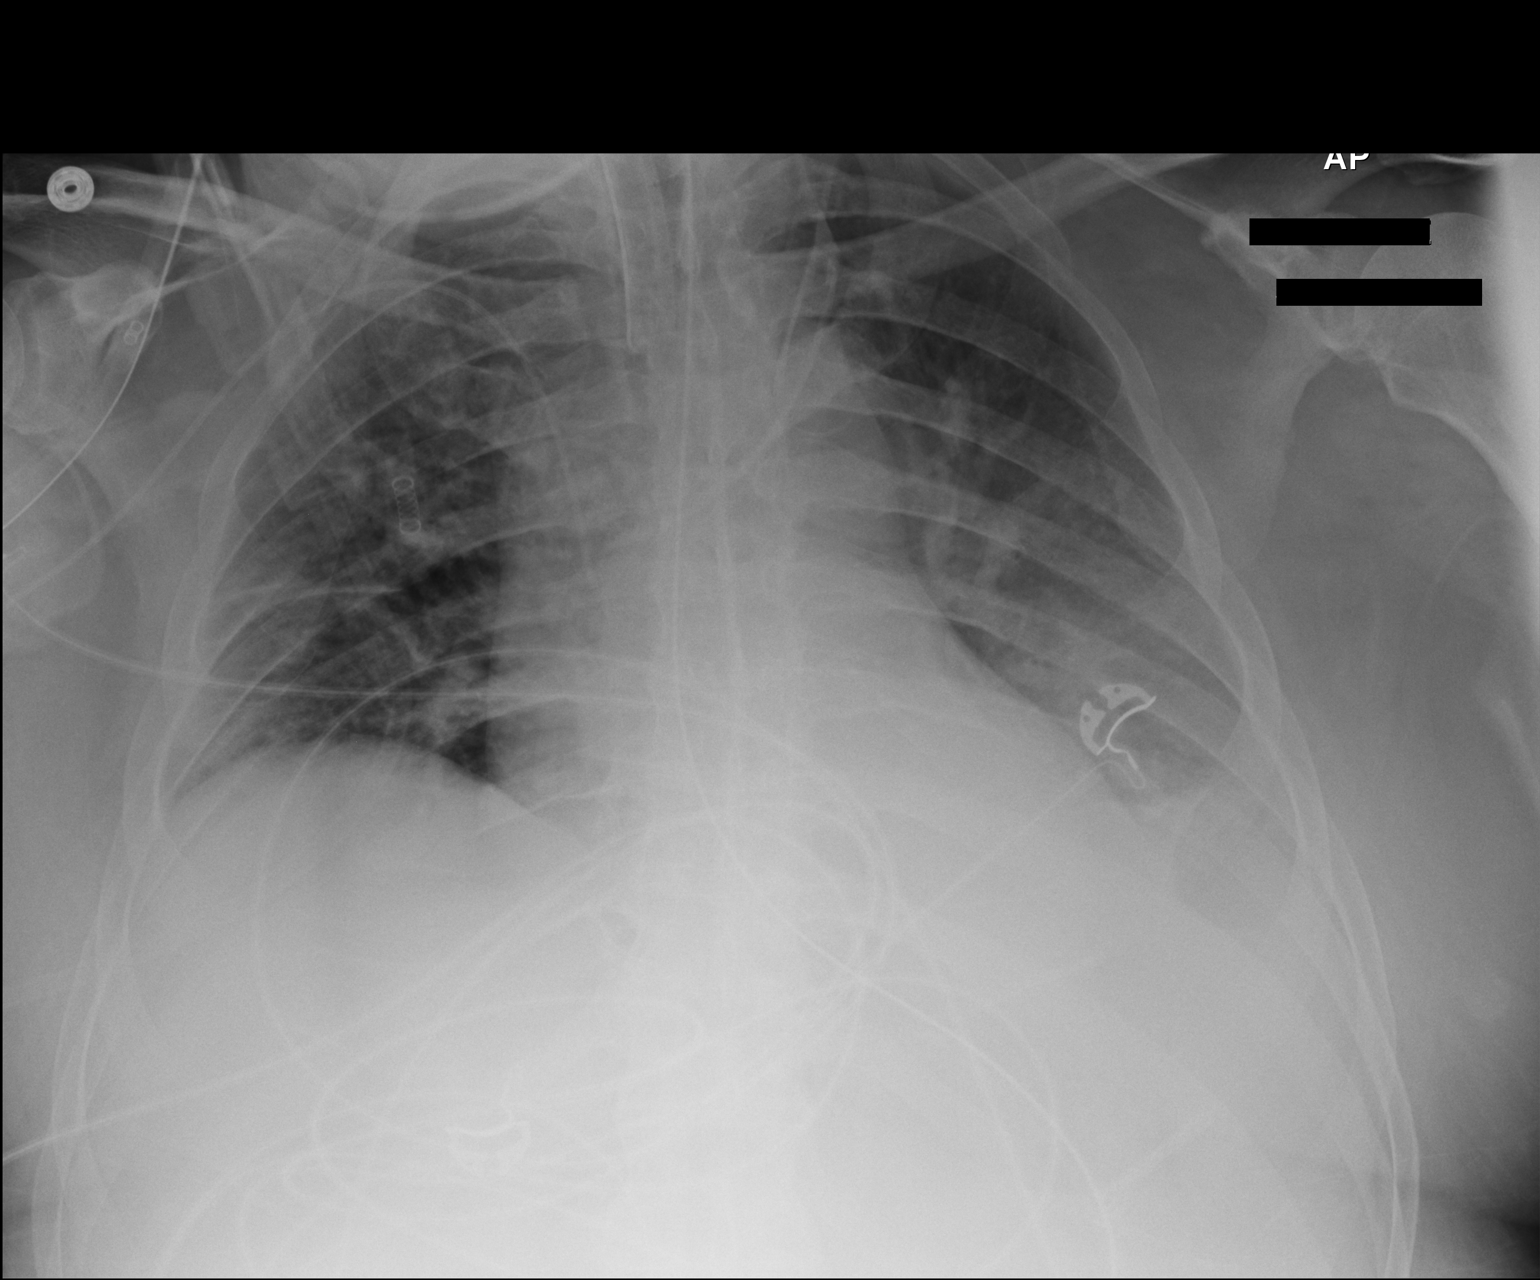

[1 of 1 positions shown; findings below may reference images not displayed]

FINDINGS: Endotracheal tube, NG tube, right PICC line in stable position.
Stable cardiomegaly. Persistent left lower lobe infiltrate and small
left pleural effusion. Right base subsegmental atelectasis.
IMPRESSION: 1. Lines and tubes in stable position.
2. Persistent left lower lobe infiltrate and small left pleural
effusion.
3. Persistent right base subsegmental atelectasis .

## 2016-05-24 IMAGING — CR DG CHEST 1V PORT
1 series · 1 of 1 positions shown · non-contrast
Comparison: 04/14/2015.

CLINICAL DATA: Intubation.

EXAM:
PORTABLE CHEST - 1 VIEW

[AP]
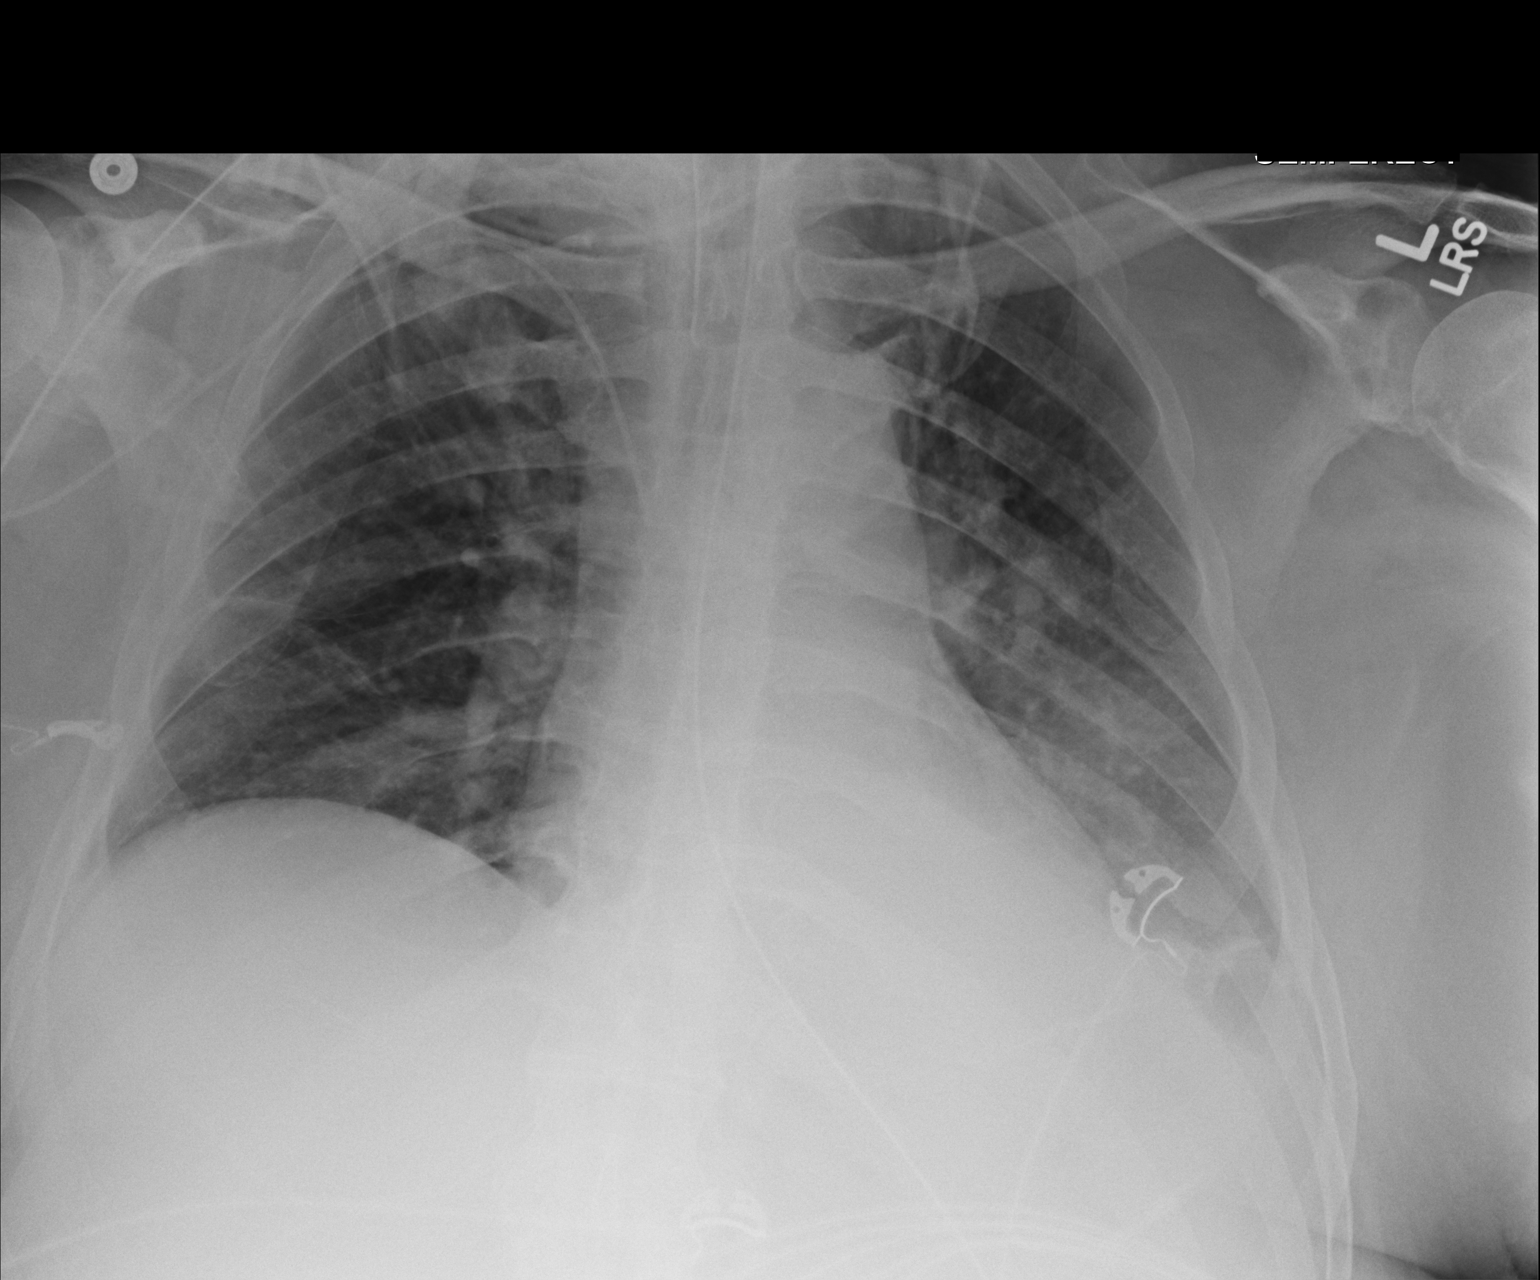

[1 of 1 positions shown; findings below may reference images not displayed]

FINDINGS: Endotracheal tube, NG tube, and right PICC line in stable position.
Left IJ lines been removed. Mediastinum hilar structures are normal.
Heart size stable. Interim slight clearing of left lower lobe
infiltrate. Small left pleural effusion. No pneumothorax.
IMPRESSION: 1. Interim removal of left IJ line. Remaining lines and tubes in
stable position.
2. Interim slight clearing of left lower lobe infiltrate. Small left
pleural effusion again noted.
